# Patient Record
Sex: Male | Born: 1945
Health system: Southern US, Community
[De-identification: ages and names within clinical notes are randomized; demographics above are authoritative.]

## PROBLEM LIST (undated history)

## (undated) DIAGNOSIS — Z8659 Personal history of other mental and behavioral disorders: Secondary | ICD-10-CM

## (undated) DIAGNOSIS — E279 Disorder of adrenal gland, unspecified: Secondary | ICD-10-CM

## (undated) DIAGNOSIS — E785 Hyperlipidemia, unspecified: Secondary | ICD-10-CM

## (undated) DIAGNOSIS — N319 Neuromuscular dysfunction of bladder, unspecified: Secondary | ICD-10-CM

## (undated) DIAGNOSIS — Z8719 Personal history of other diseases of the digestive system: Secondary | ICD-10-CM

## (undated) DIAGNOSIS — E2609 Other primary hyperaldosteronism: Secondary | ICD-10-CM

## (undated) DIAGNOSIS — D3501 Benign neoplasm of right adrenal gland: Secondary | ICD-10-CM

## (undated) DIAGNOSIS — T8859XA Other complications of anesthesia, initial encounter: Secondary | ICD-10-CM

## (undated) DIAGNOSIS — I10 Essential (primary) hypertension: Secondary | ICD-10-CM

## (undated) DIAGNOSIS — I639 Cerebral infarction, unspecified: Secondary | ICD-10-CM

## (undated) DIAGNOSIS — I2510 Atherosclerotic heart disease of native coronary artery without angina pectoris: Secondary | ICD-10-CM

## (undated) DIAGNOSIS — T4145XA Adverse effect of unspecified anesthetic, initial encounter: Secondary | ICD-10-CM

## (undated) DIAGNOSIS — M1990 Unspecified osteoarthritis, unspecified site: Secondary | ICD-10-CM

## (undated) DIAGNOSIS — M5126 Other intervertebral disc displacement, lumbar region: Secondary | ICD-10-CM

## (undated) DIAGNOSIS — G709 Myoneural disorder, unspecified: Secondary | ICD-10-CM

## (undated) DIAGNOSIS — M2040 Other hammer toe(s) (acquired), unspecified foot: Secondary | ICD-10-CM

## (undated) DIAGNOSIS — I739 Peripheral vascular disease, unspecified: Secondary | ICD-10-CM

## (undated) DIAGNOSIS — M21619 Bunion of unspecified foot: Secondary | ICD-10-CM

## (undated) HISTORY — DX: Hyperlipidemia, unspecified: E78.5

## (undated) HISTORY — DX: Benign neoplasm of right adrenal gland: D35.01

## (undated) HISTORY — DX: Other primary hyperaldosteronism: E26.09

## (undated) HISTORY — DX: Neuromuscular dysfunction of bladder, unspecified: N31.9

## (undated) HISTORY — PX: CERVICAL FUSION: SHX112

## (undated) HISTORY — PX: HERNIA REPAIR: SHX51

## (undated) HISTORY — DX: Personal history of other mental and behavioral disorders: Z86.59

## (undated) HISTORY — DX: Peripheral vascular disease, unspecified: I73.9

## (undated) HISTORY — PX: CARPAL TUNNEL RELEASE: SHX101

## (undated) HISTORY — PX: CORONARY ANGIOPLASTY WITH STENT PLACEMENT: SHX49

---

## 2007-02-17 ENCOUNTER — Emergency Department (HOSPITAL_COMMUNITY): Admission: EM | Admit: 2007-02-17 | Discharge: 2007-02-17 | Payer: Self-pay | Admitting: Emergency Medicine

## 2007-08-10 ENCOUNTER — Encounter: Admission: RE | Admit: 2007-08-10 | Discharge: 2007-08-10 | Payer: Self-pay | Admitting: Orthopaedic Surgery

## 2008-11-03 ENCOUNTER — Ambulatory Visit (HOSPITAL_COMMUNITY): Admission: RE | Admit: 2008-11-03 | Discharge: 2008-11-03 | Payer: Self-pay | Admitting: Urology

## 2009-10-18 ENCOUNTER — Inpatient Hospital Stay (HOSPITAL_COMMUNITY): Admission: EM | Admit: 2009-10-18 | Discharge: 2009-10-23 | Payer: Self-pay | Admitting: Emergency Medicine

## 2009-12-04 ENCOUNTER — Emergency Department (HOSPITAL_COMMUNITY): Admission: EM | Admit: 2009-12-04 | Discharge: 2009-12-04 | Payer: Self-pay | Admitting: Emergency Medicine

## 2009-12-05 ENCOUNTER — Emergency Department (HOSPITAL_COMMUNITY): Admission: EM | Admit: 2009-12-05 | Discharge: 2009-12-05 | Payer: Self-pay | Admitting: Emergency Medicine

## 2010-09-20 ENCOUNTER — Other Ambulatory Visit: Payer: Self-pay | Admitting: Specialist

## 2010-09-20 ENCOUNTER — Ambulatory Visit
Admission: RE | Admit: 2010-09-20 | Discharge: 2010-09-20 | Disposition: A | Discharge status: Home / Self Care | Payer: Self-pay | Source: Ambulatory Visit | Attending: Specialist | Admitting: Specialist

## 2010-09-20 DIAGNOSIS — R52 Pain, unspecified: Secondary | ICD-10-CM

## 2010-09-30 LAB — BASIC METABOLIC PANEL
BUN: 10 mg/dL (ref 6–23)
CO2: 28 mEq/L (ref 19–32)
Calcium: 8.8 mg/dL (ref 8.4–10.5)
Chloride: 103 mEq/L (ref 96–112)
GFR calc non Af Amer: >60 mL/min (ref >60)
Potassium: 3.5 mEq/L (ref 3.5–5.1)

## 2010-09-30 LAB — DIFFERENTIAL
Basophils Relative: 0 % (ref 0–1)
Eosinophils Relative: 5 % (ref 0–5)
Lymphocytes Relative: 29 % (ref 12–46)
Monocytes Absolute: 0.5 10*3/uL (ref 0.1–1.0)

## 2010-09-30 LAB — CBC
HCT: 41.1 % (ref 39.0–52.0)
Hemoglobin: 14 g/dL (ref 13.0–17.0)
MCHC: 34.1 g/dL (ref 30.0–36.0)
Platelets: 177 10*3/uL (ref 150–400)
RDW: 13.4 % (ref 11.5–15.5)

## 2010-10-02 LAB — CBC
HCT: 37.1 % — ABNORMAL LOW (ref 39.0–52.0)
HCT: 37.7 % — ABNORMAL LOW (ref 39.0–52.0)
HCT: 37.7 % — ABNORMAL LOW (ref 39.0–52.0)
HCT: 38.7 % — ABNORMAL LOW (ref 39.0–52.0)
Hemoglobin: 12.7 g/dL — ABNORMAL LOW (ref 13.0–17.0)
Hemoglobin: 13 g/dL (ref 13.0–17.0)
Hemoglobin: 13.1 g/dL (ref 13.0–17.0)
Hemoglobin: 14 g/dL (ref 13.0–17.0)
MCHC: 33.7 g/dL (ref 30.0–36.0)
MCHC: 33.9 g/dL (ref 30.0–36.0)
MCV: 98 fL (ref 78.0–100.0)
MCV: 98.7 fL (ref 78.0–100.0)
MCV: 98.8 fL (ref 78.0–100.0)
Platelets: 139 10*3/uL — ABNORMAL LOW (ref 150–400)
Platelets: 140 10*3/uL — ABNORMAL LOW (ref 150–400)
RBC: 3.79 MIL/uL — ABNORMAL LOW (ref 4.22–5.81)
RBC: 3.81 MIL/uL — ABNORMAL LOW (ref 4.22–5.81)
RBC: 4.16 MIL/uL — ABNORMAL LOW (ref 4.22–5.81)
RDW: 13.4 % (ref 11.5–15.5)
RDW: 13.7 % (ref 11.5–15.5)
WBC: 5.1 10*3/uL (ref 4.0–10.5)
WBC: 5.4 10*3/uL (ref 4.0–10.5)
WBC: 6.1 10*3/uL (ref 4.0–10.5)
WBC: 7 10*3/uL (ref 4.0–10.5)

## 2010-10-02 LAB — URINALYSIS, ROUTINE W REFLEX MICROSCOPIC
Bilirubin Urine: NEGATIVE
Hgb urine dipstick: NEGATIVE
Specific Gravity, Urine: 1.009 (ref 1.005–1.030)
pH: 7 (ref 5.0–8.0)

## 2010-10-02 LAB — DIFFERENTIAL
Basophils Absolute: 0 10*3/uL (ref 0.0–0.1)
Basophils Relative: 1 % (ref 0–1)
Eosinophils Absolute: 0.1 10*3/uL (ref 0.0–0.7)
Eosinophils Relative: 1 % (ref 0–5)
Eosinophils Relative: 3 % (ref 0–5)
Lymphocytes Relative: 21 % (ref 12–46)
Lymphocytes Relative: 32 % (ref 12–46)
Lymphs Abs: 1.3 10*3/uL (ref 0.7–4.0)
Lymphs Abs: 1.8 10*3/uL (ref 0.7–4.0)
Monocytes Absolute: 0.6 10*3/uL (ref 0.1–1.0)
Monocytes Absolute: 0.9 10*3/uL (ref 0.1–1.0)
Monocytes Relative: 11 % (ref 3–12)
Monocytes Relative: 15 % — ABNORMAL HIGH (ref 3–12)

## 2010-10-02 LAB — TROPONIN I
Troponin I: 0.01 ng/mL (ref 0.00–0.06)
Troponin I: <0.01 ng/mL (ref 0.00–0.06)

## 2010-10-02 LAB — COMPREHENSIVE METABOLIC PANEL
ALT: 42 U/L (ref 0–53)
AST: 30 U/L (ref 0–37)
Albumin: 4.3 g/dL (ref 3.5–5.2)
Alkaline Phosphatase: 54 U/L (ref 39–117)
Calcium: 9.3 mg/dL (ref 8.4–10.5)
Creatinine, Ser: 0.91 mg/dL (ref 0.4–1.5)
Glucose, Bld: 115 mg/dL — ABNORMAL HIGH (ref 70–99)
Total Bilirubin: 0.9 mg/dL (ref 0.3–1.2)
Total Protein: 6.9 g/dL (ref 6.0–8.3)

## 2010-10-02 LAB — BASIC METABOLIC PANEL
BUN: 9 mg/dL (ref 6–23)
CO2: 24 mEq/L (ref 19–32)
Calcium: 8.5 mg/dL (ref 8.4–10.5)
Calcium: 8.7 mg/dL (ref 8.4–10.5)
Chloride: 104 mEq/L (ref 96–112)
Chloride: 105 mEq/L (ref 96–112)
GFR calc Af Amer: >60 mL/min (ref >60)
GFR calc Af Amer: >60 mL/min (ref >60)
GFR calc non Af Amer: >60 mL/min (ref >60)
GFR calc non Af Amer: >60 mL/min (ref >60)
Glucose, Bld: 127 mg/dL — ABNORMAL HIGH (ref 70–99)
Glucose, Bld: 137 mg/dL — ABNORMAL HIGH (ref 70–99)
Glucose, Bld: 140 mg/dL — ABNORMAL HIGH (ref 70–99)
Potassium: 3.3 mEq/L — ABNORMAL LOW (ref 3.5–5.1)
Potassium: 3.3 mEq/L — ABNORMAL LOW (ref 3.5–5.1)
Potassium: 4.1 mEq/L (ref 3.5–5.1)
Sodium: 137 mEq/L (ref 135–145)
Sodium: 137 mEq/L (ref 135–145)
Sodium: 138 mEq/L (ref 135–145)

## 2010-10-02 LAB — HEMOGLOBIN A1C: Mean Plasma Glucose: 137 mg/dL

## 2010-10-02 LAB — CK TOTAL AND CKMB (NOT AT ARMC)
CK, MB: 9.2 ng/mL (ref 0.3–4.0)
Total CK: 498 U/L — ABNORMAL HIGH (ref 7–232)
Total CK: 543 U/L — ABNORMAL HIGH (ref 7–232)

## 2010-10-02 LAB — PROTIME-INR
INR: 1.01 (ref 0.00–1.49)
INR: 1.04 (ref 0.00–1.49)
Prothrombin Time: 13.5 seconds (ref 11.6–15.2)

## 2010-10-02 LAB — CARDIAC PANEL(CRET KIN+CKTOT+MB+TROPI)
CK, MB: 5.5 ng/mL — ABNORMAL HIGH (ref 0.3–4.0)
Relative Index: 1.6 (ref 0.0–2.5)

## 2010-10-02 LAB — URINE CULTURE: Colony Count: 6000

## 2010-10-02 LAB — LIPID PANEL
HDL: 46 mg/dL (ref >39)
VLDL: 11 mg/dL (ref 0–40)

## 2010-10-02 LAB — APTT: aPTT: 36 seconds (ref 24–37)

## 2011-04-28 LAB — COMPREHENSIVE METABOLIC PANEL
ALT: 91 — ABNORMAL HIGH
BUN: 14
CO2: 27
Calcium: 8.7
GFR calc non Af Amer: 54 — ABNORMAL LOW
Glucose, Bld: 103 — ABNORMAL HIGH
Sodium: 135
Total Protein: 6

## 2011-04-28 LAB — POCT CARDIAC MARKERS
CKMB, poc: 4.3
Myoglobin, poc: 162
Myoglobin, poc: 183
Operator id: 285491
Operator id: 285491
Operator id: 285491
Troponin i, poc: <0.05
Troponin i, poc: <0.05

## 2011-04-28 LAB — CBC
HCT: 41.5
Hemoglobin: 14.5
MCHC: 34.9
MCV: 96
RBC: 4.32
RDW: 13.2

## 2011-04-28 LAB — DIFFERENTIAL
Basophils Relative: 1
Eosinophils Absolute: 0.1
Lymphs Abs: 1.4
Monocytes Relative: 10
Neutro Abs: 3
Neutrophils Relative %: 61

## 2011-04-28 LAB — APTT: aPTT: 34

## 2011-05-25 ENCOUNTER — Emergency Department (HOSPITAL_COMMUNITY)
Admission: EM | Admit: 2011-05-25 | Discharge: 2011-05-25 | Disposition: A | Discharge status: Home / Self Care | Payer: Medicare Other | Attending: Emergency Medicine | Admitting: Emergency Medicine

## 2011-05-25 ENCOUNTER — Encounter: Payer: Self-pay | Admitting: *Deleted

## 2011-05-25 DIAGNOSIS — Z981 Arthrodesis status: Secondary | ICD-10-CM | POA: Insufficient documentation

## 2011-05-25 DIAGNOSIS — M79609 Pain in unspecified limb: Secondary | ICD-10-CM | POA: Insufficient documentation

## 2011-05-25 DIAGNOSIS — M7989 Other specified soft tissue disorders: Secondary | ICD-10-CM

## 2011-05-25 DIAGNOSIS — Z8673 Personal history of transient ischemic attack (TIA), and cerebral infarction without residual deficits: Secondary | ICD-10-CM | POA: Insufficient documentation

## 2011-05-25 DIAGNOSIS — Z79899 Other long term (current) drug therapy: Secondary | ICD-10-CM | POA: Insufficient documentation

## 2011-05-25 DIAGNOSIS — M25579 Pain in unspecified ankle and joints of unspecified foot: Secondary | ICD-10-CM | POA: Insufficient documentation

## 2011-05-25 DIAGNOSIS — M62838 Other muscle spasm: Secondary | ICD-10-CM | POA: Insufficient documentation

## 2011-05-25 DIAGNOSIS — I891 Lymphangitis: Secondary | ICD-10-CM | POA: Insufficient documentation

## 2011-05-25 DIAGNOSIS — IMO0001 Reserved for inherently not codable concepts without codable children: Secondary | ICD-10-CM | POA: Insufficient documentation

## 2011-05-25 DIAGNOSIS — R509 Fever, unspecified: Secondary | ICD-10-CM | POA: Insufficient documentation

## 2011-05-25 DIAGNOSIS — M25559 Pain in unspecified hip: Secondary | ICD-10-CM | POA: Insufficient documentation

## 2011-05-25 DIAGNOSIS — I10 Essential (primary) hypertension: Secondary | ICD-10-CM | POA: Insufficient documentation

## 2011-05-25 HISTORY — DX: Cerebral infarction, unspecified: I63.9

## 2011-05-25 HISTORY — DX: Atherosclerotic heart disease of native coronary artery without angina pectoris: I25.10

## 2011-05-25 HISTORY — DX: Essential (primary) hypertension: I10

## 2011-05-25 LAB — PROTIME-INR: Prothrombin Time: 13.4 seconds (ref 11.6–15.2)

## 2011-05-25 LAB — BASIC METABOLIC PANEL
CO2: 25 mEq/L (ref 19–32)
Chloride: 97 mEq/L (ref 96–112)
Potassium: 4.6 mEq/L (ref 3.5–5.1)
Sodium: 133 mEq/L — ABNORMAL LOW (ref 135–145)

## 2011-05-25 LAB — CBC
HCT: 44.3 % (ref 39.0–52.0)
Platelets: 187 10*3/uL (ref 150–400)
RDW: 13.4 % (ref 11.5–15.5)
WBC: 10.6 10*3/uL — ABNORMAL HIGH (ref 4.0–10.5)

## 2011-05-25 LAB — DIFFERENTIAL
Basophils Absolute: 0 10*3/uL (ref 0.0–0.1)
Lymphocytes Relative: 19 % (ref 12–46)
Neutro Abs: 6.8 10*3/uL (ref 1.7–7.7)
Neutrophils Relative %: 64 % (ref 43–77)

## 2011-05-25 LAB — APTT: aPTT: 34 seconds (ref 24–37)

## 2011-05-25 MED ORDER — CLINDAMYCIN HCL 150 MG PO CAPS: 300.0000 mg | ORAL_CAPSULE | Freq: Four times a day (QID) | ORAL | Status: AC

## 2011-05-25 MED ORDER — HYDROCODONE-ACETAMINOPHEN 5-325 MG PO TABS: 1.0000 | ORAL_TABLET | Freq: Four times a day (QID) | ORAL | Status: AC | PRN

## 2011-05-25 MED ORDER — CLINDAMYCIN PHOSPHATE 600 MG/4ML IJ SOLN
INTRAMUSCULAR | Status: AC
  Filled 2011-05-25: qty 4

## 2011-05-25 MED ORDER — SODIUM CHLORIDE 0.9 % IV SOLN
INTRAVENOUS | Status: DC
  Administered 2011-05-25: 11:00:00 via INTRAVENOUS

## 2011-05-25 MED ORDER — ACETAMINOPHEN 325 MG PO TABS: 650.0000 mg | ORAL_TABLET | ORAL | Status: DC | PRN

## 2011-05-25 MED ORDER — MORPHINE SULFATE 4 MG/ML IJ SOLN: 4.0000 mg | INTRAMUSCULAR | Status: DC | PRN

## 2011-05-25 MED ORDER — DEXTROSE 5 % IV SOLN
INTRAVENOUS | Status: AC
  Filled 2011-05-25: qty 50

## 2011-05-25 MED ORDER — CLINDAMYCIN PHOSPHATE 600 MG/50ML IV SOLN
600.0000 mg | INTRAVENOUS | Status: AC
Start: 2011-05-25 — End: 2011-05-25
  Administered 2011-05-25: 600 mg via INTRAVENOUS
  Filled 2011-05-25: qty 50

## 2011-05-25 NOTE — ED Provider Notes (Signed)
History     CSN: [id number] Arrival date & time: 05/25/2011  2:00 AM   First MD Initiated Contact with Patient 05/25/11 0236      Chief Complaint  Patient presents with  . Leg Pain    (Consider location/radiation/quality/duration/timing/severity/associated sxs/prior treatment) HPI Comments: Well-appearing 65 year old male with no history of diabetes who presents with redness and pain to his left lower extremity. The pain started on Wednesday and has progressively gotten worse and today noticed that he had some red streaking from his lower medial extremity on the medial surface up to his groin. This is in a linear fashion, has associated warmth. He has had a stroke in the past affecting his left side causing weakness and some cramping and fasciculations. This has not changed. He denies trauma, swelling, shortness of breath or chest pain. He is on Plavix for his prior stroke and heart disease but has never been on Coumadin. He denies any bleeding problems. He has not been on any long trips recently however due to his stroke he does have a relative immobility of his left lower extremity. He does admit to a subjective fever in the last 24 hours.  Patient is a 65 y.o. male presenting with leg pain. The history is provided by the patient.  Leg Pain  Incident onset: 5 days ago. The incident occurred at home. There was no injury mechanism. Pain location: Left lower extremity from ankle to hip. The quality of the pain is described as aching. The pain is mild. The pain has been constant since onset. Associated symptoms comments: Redness and warmth, fever. He reports no foreign bodies present. The symptoms are aggravated by nothing. Treatments tried: Prescription for cephalexin for a prior illness times one dose. The treatment provided no relief.    Past Medical History  Diagnosis Date  . Stroke   . Hypertension     Past Surgical History  Procedure Date  . Cervical fusion     History reviewed.  No pertinent family history.  History  Substance Use Topics  . Smoking status: Never Smoker   . Smokeless tobacco: Not on file  . Alcohol Use: No      Review of Systems  All other systems reviewed and are negative.    Allergies  Elavil  Home Medications   Current Outpatient Rx  Name Route Sig Dispense Refill  . CEPHALEXIN 500 MG PO CAPS Oral Take 500 mg by mouth 2 (two) times daily.      Marland Kitchen CLONIDINE HCL 0.2 MG PO TABS Oral Take 0.2 mg by mouth 2 (two) times daily.      Marland Kitchen CLOPIDOGREL BISULFATE 75 MG PO TABS Oral Take 75 mg by mouth daily.      Marland Kitchen HYDROCODONE-ACETAMINOPHEN 10-500 MG PO TABS Oral Take 1 tablet by mouth every 6 (six) hours as needed.      Marland Kitchen LABETALOL HCL 300 MG PO TABS Oral Take 300 mg by mouth 2 (two) times daily.      Marland Kitchen NIFEDIPINE ER 60 MG PO TB24 Oral Take 60 mg by mouth daily.      Marland Kitchen SIMVASTATIN 20 MG PO TABS Oral Take 20 mg by mouth at bedtime.      . SPIRONOLACTONE 50 MG PO TABS Oral Take 50 mg by mouth 2 (two) times daily.      Marland Kitchen TAMSULOSIN HCL 0.4 MG PO CAPS Oral Take 0.4 mg by mouth daily.      Marland Kitchen TIZANIDINE HCL 4 MG PO TABS Oral Take  4 mg by mouth every 6 (six) hours as needed.        BP 157/72  Temp(Src) 98.3 F (36.8 C) (Oral)  Resp 18  SpO2 99%  Physical Exam  Nursing note and vitals reviewed. Constitutional: He appears well-developed and well-nourished. No distress.  HENT:  Head: Normocephalic and atraumatic.  Mouth/Throat: Oropharynx is clear and moist. No oropharyngeal exudate.  Eyes: Conjunctivae and EOM are normal. Pupils are equal, round, and reactive to light. Right eye exhibits no discharge. Left eye exhibits no discharge. No scleral icterus.  Neck: Normal range of motion. Neck supple. No JVD present. No thyromegaly present.  Cardiovascular: Normal rate, regular rhythm, normal heart sounds and intact distal pulses.  Exam reveals no gallop and no friction rub.   No murmur heard. Pulmonary/Chest: Effort normal and breath sounds normal.  No respiratory distress. He has no wheezes. He has no rales.  Abdominal: Soft. Bowel sounds are normal. He exhibits no distension and no mass. There is no tenderness.  Musculoskeletal: Normal range of motion. He exhibits tenderness. He exhibits no edema ( No edema of the bilateral lower extremities.).       Flexion contracture of the left upper extremity, left lower extremity with some muscle fasciculations and redness and streaking from his left lower medial distal extremity on the medial surface following a saphenous vein distribution to the left inguinal area. There are no masses or lymphadenopathy following this past  Lymphadenopathy:    He has no cervical adenopathy.  Neurological: He is alert. Coordination normal.  Skin: Skin is warm and dry. There is erythema ( As described in musculoskeletal).  Psychiatric: He has a normal mood and affect. His behavior is normal.    ED Course  Procedures (including critical care time)  Labs Reviewed - No data to display No results found.   No diagnosis found.    MDM  No chest pain or shortness of breath, has new streaking of his left lower extremity consistent with either a standing lymphangitis, would also consider superficial or deep thrombophlebitis of the left lower extremity. Will obtain coags, lab work, d-dimer, plan on CDU admission for ultrasound in the morning. Until that time we'll treat with vancomycin for presumed infection. There are no other wounds on his foot to suggest a source for introduction of infection. His vital signs at this time reveal no fevers tachycardia or hypotension.   Labwork ordered, ultrasound ordered, placed on CDU observation protocol for DVT. Has been given antibiotics for possible descending lymphangitis. I discussed at length with the patient his need for possible admission should he have a negative ultrasound for possible significant infection of his leg but he has declined stating that he has to take care of  his elderly mother and we'll need to go home today. He is amenable to staying for the ultrasound this morning.  Change of shift, care signed out to Dr. Jeraldine Loots and [facility]    Vida Roller, MD 05/25/11 [phone]

## 2011-05-25 NOTE — ED Provider Notes (Signed)
Patient is in CDU under observation, DVT protocol.  Patient has had negative doppler study.  IV clindamycin has been ordered.  Results discussed with patient.  Patient with erythematous streak up inner aspect of left leg.  Plan is for IV abx and d/c home with PO abx.  Discussed with Dr Hyacinth Meeker that I would attempt to call PCP to ensure follow up.  Will put in page - as it is Sunday I am unsure if the page will be returned.  11:47 AM Bosie Clos, Diplomatic Services operational officer has attempted to call the practice and states there is no evidence of anyone being on call.   12:13 PM Patient is feeling very well, ready to go home.  First Clindamycin dose given.  Discussed with Dr Jeraldine Loots as I was unable to contact PCP.  Patient to follow up in 24-48 hours here or with PCP.  Pt verbalizes understanding of this plan.   Howard Jackson, [place] 05/25/11 1620

## 2011-05-25 NOTE — ED Notes (Signed)
Pt has a red streak going up inside leg from above ankle to groin. Flaky skin on left lower extremity between ankle and knee.

## 2011-05-25 NOTE — ED Notes (Signed)
Report received from Myrtha Mantis, RN. Pt is currently having vascular study done of leg. Pt will be brought to cdu#3 after study is completed.

## 2011-05-25 NOTE — Progress Notes (Signed)
*  PRELIMINARY RESULTS*   Left lower extremity venous Doppler has been performed.  There is no deep vein thrombosis or superficial thrombosis noted in the left lower extremity.  Sherren Kerns Renee 05/25/2011, 8:57 AM

## 2011-05-25 NOTE — ED Notes (Signed)
Pt reports that he thinks he left a bottle of his medication on the ambulance that brought him in last night. Talked with Kristi, CN and she talked with [facility] and they are checking into it.

## 2011-05-25 NOTE — ED Notes (Signed)
Pt arrived via [facility] c/o Pain in left leg with redness, that started 3 days ago. EMS VS BP 180/80, HR 100.

## 2011-05-26 NOTE — ED Provider Notes (Signed)
Medical screening examination/treatment/procedure(s) were conducted as a shared visit with non-physician practitioner(s) and myself.  I personally evaluated the patient during the encounter ' Please see my personal evaluation as separate and complete note.  Vida Roller, MD 05/26/11 [phone]

## 2011-10-31 ENCOUNTER — Other Ambulatory Visit: Payer: Self-pay | Admitting: Neurology

## 2011-10-31 DIAGNOSIS — M5430 Sciatica, unspecified side: Secondary | ICD-10-CM

## 2011-10-31 DIAGNOSIS — I679 Cerebrovascular disease, unspecified: Secondary | ICD-10-CM

## 2011-10-31 DIAGNOSIS — R269 Unspecified abnormalities of gait and mobility: Secondary | ICD-10-CM

## 2011-11-06 ENCOUNTER — Ambulatory Visit
Admission: RE | Admit: 2011-11-06 | Discharge: 2011-11-06 | Disposition: A | Discharge status: Home / Self Care | Payer: Medicare Other | Source: Ambulatory Visit | Attending: Neurology | Admitting: Neurology

## 2011-11-06 DIAGNOSIS — M5430 Sciatica, unspecified side: Secondary | ICD-10-CM

## 2011-11-06 DIAGNOSIS — R269 Unspecified abnormalities of gait and mobility: Secondary | ICD-10-CM

## 2011-11-06 DIAGNOSIS — I679 Cerebrovascular disease, unspecified: Secondary | ICD-10-CM

## 2012-09-22 ENCOUNTER — Ambulatory Visit (INDEPENDENT_AMBULATORY_CARE_PROVIDER_SITE_OTHER): Payer: Medicare Other | Admitting: Surgery

## 2012-09-22 ENCOUNTER — Encounter (INDEPENDENT_AMBULATORY_CARE_PROVIDER_SITE_OTHER): Payer: Self-pay | Admitting: General Surgery

## 2012-09-22 ENCOUNTER — Encounter (INDEPENDENT_AMBULATORY_CARE_PROVIDER_SITE_OTHER): Payer: Self-pay | Admitting: Surgery

## 2012-09-22 VITALS — BP 126/80 | HR 74 | Resp 16 | Ht 66.0 in | Wt 169.8 lb

## 2012-09-22 DIAGNOSIS — K4090 Unilateral inguinal hernia, without obstruction or gangrene, not specified as recurrent: Secondary | ICD-10-CM | POA: Insufficient documentation

## 2012-09-22 NOTE — Progress Notes (Signed)
Patient ID: Howard Jackson, male   DOB: 1946/02/18, 66 y.o.   MRN: [id number]  Chief Complaint  Patient presents with  . Other    possible inguinal hernia    HPI Howard Jackson is a 67 y.o. male.   HPI This is a pleasant gentleman referred by Dr. Lerry Liner for evaluation of a symptomatic left inguinal hernia. He has had the hernia for many years but is now causing sharp pain and discomfort for the last 2-3 months. He denies nausea or vomiting or obstructive symptoms. He has a significant past medical history and reports he even has a pheochromocytoma. Past Medical History  Diagnosis Date  . Stroke   . Hypertension   . Coronary artery disease     Past Surgical History  Procedure Laterality Date  . Cervical fusion    . Coronary angioplasty with stent placement      pt reports having 5 stents in his heart    History reviewed. No pertinent family history.  Social History History  Substance Use Topics  . Smoking status: Never Smoker   . Smokeless tobacco: Not on file  . Alcohol Use: No    Allergies  Allergen Reactions  . Elavil (Amitriptyline Hcl) Hypertension    Current Outpatient Prescriptions  Medication Sig Dispense Refill  . Acetylcysteine 600 MG CAPS Take by mouth.      . ASTRAGALUS PO Take by mouth.      . cloNIDine (CATAPRES) 0.2 MG tablet Take 0.1-0.2 mg by mouth 2 (two) times daily. Takes 1 tablet in the morning, and 0.5 tablet in the evening      . clopidogrel (PLAVIX) 75 MG tablet Take 75 mg by mouth daily.        . Coenzyme Q10 (CO Q 10 PO) Take by mouth.      . cyclobenzaprine (FLEXERIL) 5 MG tablet Take 5 mg by mouth 3 (three) times daily as needed for muscle spasms.      . DEVILS CLAW PO Take by mouth.      Marland Kitchen HYDROcodone-acetaminophen (LORTAB) 10-500 MG per tablet Take 1 tablet by mouth every 6 (six) hours as needed.        . indomethacin (INDOCIN) 25 MG capsule Take 25 mg by mouth 2 (two) times daily with a meal.      . labetalol (NORMODYNE) 300 MG  tablet Take 300 mg by mouth 2 (two) times daily.        . Misc Natural Products (COLON CARE PO) Take by mouth.      Marland Kitchen NIFEdipine (PROCARDIA-XL/ADALAT CC) 60 MG 24 hr tablet Take 60 mg by mouth every evening.       . sertraline (ZOLOFT) 50 MG tablet Take 50 mg by mouth daily.      . simvastatin (ZOCOR) 20 MG tablet Take 20 mg by mouth daily.       Marland Kitchen spironolactone (ALDACTONE) 50 MG tablet Take 50 mg by mouth 2 (two) times daily.        . Tamsulosin HCl (FLOMAX) 0.4 MG CAPS Take 0.4 mg by mouth daily.        Marland Kitchen tiZANidine (ZANAFLEX) 4 MG tablet Take 4 mg by mouth every 6 (six) hours as needed. Shoulder pain       No current facility-administered medications for this visit.    Review of Systems Review of Systems  Constitutional: Negative for fever, chills and unexpected weight change.  HENT: Negative for hearing loss, congestion, sore throat, trouble swallowing and voice change.  Eyes: Negative for visual disturbance.  Respiratory: Negative for cough and wheezing.   Cardiovascular: Negative for chest pain, palpitations and leg swelling.  Gastrointestinal: Positive for abdominal pain. Negative for nausea, vomiting, diarrhea, constipation, blood in stool, abdominal distention, anal bleeding and rectal pain.  Endocrine: Positive for polyuria.  Genitourinary: Negative for hematuria and difficulty urinating.  Musculoskeletal: Negative for arthralgias.  Skin: Negative for rash and wound.  Neurological: Positive for weakness. Negative for seizures, syncope and headaches.  Hematological: Negative for adenopathy. Does not bruise/bleed easily.  Psychiatric/Behavioral: Negative for confusion.    Blood pressure 126/80, pulse 74, resp. rate 16, height 5\' 6"  (1.676 m), weight 169 lb 12.8 oz (77.021 kg).  Physical Exam Physical Exam  Constitutional: He is oriented to person, place, and time. He appears well-developed and well-nourished. No distress.  HENT:  Head: Normocephalic and atraumatic.   Right Ear: External ear normal.  Nose: Nose normal.  Mouth/Throat: Oropharynx is clear and moist.  Eyes: Conjunctivae are normal. Pupils are equal, round, and reactive to light. Right eye exhibits no discharge. Left eye exhibits no discharge. No scleral icterus.  Neck: Normal range of motion. Neck supple. No JVD present. No tracheal deviation present. No thyromegaly present.  Cardiovascular: Normal rate, regular rhythm, normal heart sounds and intact distal pulses.   No murmur heard. Pulmonary/Chest: Effort normal and breath sounds normal. No respiratory distress. He has no wheezes. He has no rales.  Abdominal: Soft. Bowel sounds are normal. He exhibits no distension. There is no tenderness. There is no rebound.  Moderate-sized left inguinal hernia which is reducible. There is no right inguinal hernia  Musculoskeletal: He exhibits no edema and no tenderness.  Abnormality of his left upper and lower extremity from his stroke  Lymphadenopathy:    He has no cervical adenopathy.  Neurological: He is alert and oriented to person, place, and time.  Decreased motor function of the left upper and lower extremity  Skin: Skin is warm and dry. No rash noted. He is not diaphoretic. No erythema.  Psychiatric: His behavior is normal. Judgment normal.    Data Reviewed   Assessment    Left inguinal hernia     Plan    Repair with mesh is recommended secondary to his discomfort. He will need cardiac evaluation for clearance preoperatively. I would also limit his anesthesia secondary to his history of pheochromocytoma. We will schedule his surgery after getting cardiac clearance.        BLACKMAN,DOUGLAS A 09/22/2012, 3:38 PM

## 2012-09-30 ENCOUNTER — Other Ambulatory Visit (HOSPITAL_COMMUNITY): Payer: Self-pay | Admitting: Cardiovascular Disease

## 2012-09-30 DIAGNOSIS — I10 Essential (primary) hypertension: Secondary | ICD-10-CM

## 2012-09-30 DIAGNOSIS — Z01810 Encounter for preprocedural cardiovascular examination: Secondary | ICD-10-CM

## 2012-09-30 DIAGNOSIS — I2510 Atherosclerotic heart disease of native coronary artery without angina pectoris: Secondary | ICD-10-CM

## 2012-10-14 ENCOUNTER — Ambulatory Visit (HOSPITAL_COMMUNITY)
Admission: RE | Admit: 2012-10-14 | Discharge: 2012-10-14 | Disposition: A | Discharge status: Home / Self Care | Payer: Medicare Other | Source: Ambulatory Visit | Attending: Cardiovascular Disease | Admitting: Cardiovascular Disease

## 2012-10-14 DIAGNOSIS — R5383 Other fatigue: Secondary | ICD-10-CM | POA: Insufficient documentation

## 2012-10-14 DIAGNOSIS — Z8249 Family history of ischemic heart disease and other diseases of the circulatory system: Secondary | ICD-10-CM | POA: Insufficient documentation

## 2012-10-14 DIAGNOSIS — I739 Peripheral vascular disease, unspecified: Secondary | ICD-10-CM | POA: Insufficient documentation

## 2012-10-14 DIAGNOSIS — I10 Essential (primary) hypertension: Secondary | ICD-10-CM

## 2012-10-14 DIAGNOSIS — I2510 Atherosclerotic heart disease of native coronary artery without angina pectoris: Secondary | ICD-10-CM

## 2012-10-14 DIAGNOSIS — R5381 Other malaise: Secondary | ICD-10-CM | POA: Insufficient documentation

## 2012-10-14 DIAGNOSIS — R42 Dizziness and giddiness: Secondary | ICD-10-CM | POA: Insufficient documentation

## 2012-10-14 DIAGNOSIS — Z01810 Encounter for preprocedural cardiovascular examination: Secondary | ICD-10-CM | POA: Insufficient documentation

## 2012-10-14 MED ORDER — AMINOPHYLLINE 25 MG/ML IV SOLN
75.0000 mg | Freq: Once | INTRAVENOUS | Status: AC
  Administered 2012-10-14: 75 mg via INTRAVENOUS

## 2012-10-14 MED ORDER — TECHNETIUM TC 99M SESTAMIBI GENERIC - CARDIOLITE
30.0000 | Freq: Once | INTRAVENOUS | Status: AC | PRN
  Administered 2012-10-14: 30 via INTRAVENOUS

## 2012-10-14 MED ORDER — REGADENOSON 0.4 MG/5ML IV SOLN
0.4000 mg | Freq: Once | INTRAVENOUS | Status: AC
  Administered 2012-10-14: 0.4 mg via INTRAVENOUS

## 2012-10-14 MED ORDER — TECHNETIUM TC 99M SESTAMIBI GENERIC - CARDIOLITE
10.0000 | Freq: Once | INTRAVENOUS | Status: AC | PRN
  Administered 2012-10-14: 10 via INTRAVENOUS

## 2012-10-14 NOTE — Procedures (Addendum)
[facility] [address] [place] [address] [phone]  Cardiology Nuclear Med Study  Howard Jackson is a 67 y.o. male     MRN : [id number]     DOB: 01-Sep-1945  Procedure Date: 10/14/2012  Nuclear Med Background Indication for Stress Test:  Surgical Clearance History:  ASTHMA;CAD;MI;STENT/PTCA-2011 Cardiac Risk Factors: CVA, Family History - CAD, Hypertension, Lipids, Overweight and PVD  Symptoms:  Dizziness, Fatigue and Light-Headedness   Nuclear Pre-Procedure Caffeine/Decaff Intake:  9:00pm NPO After: 7:00am   IV Site: R Antecubital  IV 0.9% NS with Angio Cath:  22g  Chest Size (in):  44" IV Started by: Emmit Pomfret, RN  Height: 5\' 7"  (1.702 m)  Cup Size: n/a  BMI:  Body mass index is 26.31 kg/(m^2). Weight:  168 lb (76.204 kg)   Tech Comments:  N/A    Nuclear Med Study 1 or 2 day study: 1 day  Stress Test Type:  Lexiscan  Order Authorizing Provider:  Nanetta Batty, MD   Resting Radionuclide: Technetium 93m Sestamibi  Resting Radionuclide Dose: 10.2 mCi   Stress Radionuclide:  Technetium 54m Sestamibi  Stress Radionuclide Dose: 30.9 mCi           Stress Protocol Rest HR: 57 Stress HR: 75  Rest BP: 122/70 Stress BP: 110/67  Exercise Time (min): n/a METS: n/a   Predicted Max HR: 152 bpm % Max HR: 49.34 bpm Rate Pressure Product: 9150  Dose of Adenosine (mg):  n/a Dose of Lexiscan: 0.4 mg  Dose of Atropine (mg): n/a Dose of Dobutamine: n/a mcg/kg/min (at max HR)  Stress Test Technologist: Esperanza Sheets, CCT Nuclear Technologist: Koren Shiver, CNMT   Rest Procedure:  Myocardial perfusion imaging was performed at rest 45 minutes following the intravenous administration of Technetium 28m Sestamibi. Stress Procedure:  The patient received IV Lexiscan 0.4 mg over 15-seconds.  Technetium 36m Sestamibi injected at 30-seconds.  The Patient experienced SOB and was administered 75 mg of IV Aminophylline with quick  resolution.  There were no significant changes with Lexiscan.  Quantitative spect images were obtained after a 45 minute delay.  Transient Ischemic Dilatation (Normal <1.22):  0.87  Lung/Heart Ratio (Normal <0.45):  0.41 QGS EDV:  52 ml QGS ESV:  12 ml LV Ejection Fraction: 77%  Signed by       Rest ECG: NSR - Normal EKG  Stress ECG: No significant change from baseline ECG  QPS Raw Data Images:  Normal; no motion artifact; normal heart/lung ratio. Stress Images:  Normal homogeneous uptake in all areas of the myocardium. Rest Images:  Normal homogeneous uptake in all areas of the myocardium. Subtraction (SDS):  No evidence of ischemia.  Impression Exercise Capacity:  Lexiscan with no exercise. BP Response:  Normal blood pressure response. Clinical Symptoms:  No significant symptoms noted. ECG Impression:  No significant ST segment change suggestive of ischemia. Comparison with Prior Nuclear Study: No significant change from previous study  Overall Impression:  Normal stress nuclear study.  LV Wall Motion:  NL LV Function; NL Wall Motion   Runell Gess, MD  10/14/2012 1:07 PM

## 2012-10-25 ENCOUNTER — Ambulatory Visit (HOSPITAL_COMMUNITY)
Admission: RE | Admit: 2012-10-25 | Discharge: 2012-10-25 | Disposition: A | Discharge status: Home / Self Care | Payer: Medicare Other | Source: Ambulatory Visit | Attending: Cardiovascular Disease | Admitting: Cardiovascular Disease

## 2012-10-25 DIAGNOSIS — Z01810 Encounter for preprocedural cardiovascular examination: Secondary | ICD-10-CM

## 2012-10-25 DIAGNOSIS — I10 Essential (primary) hypertension: Secondary | ICD-10-CM | POA: Insufficient documentation

## 2012-10-25 DIAGNOSIS — I2510 Atherosclerotic heart disease of native coronary artery without angina pectoris: Secondary | ICD-10-CM

## 2012-10-25 NOTE — Progress Notes (Signed)
Renal Duplex Completed. Howard Jackson D  

## 2012-11-02 ENCOUNTER — Telehealth (INDEPENDENT_AMBULATORY_CARE_PROVIDER_SITE_OTHER): Payer: Self-pay | Admitting: General Surgery

## 2012-11-02 ENCOUNTER — Other Ambulatory Visit (INDEPENDENT_AMBULATORY_CARE_PROVIDER_SITE_OTHER): Payer: Self-pay | Admitting: Surgery

## 2012-11-02 ENCOUNTER — Encounter (INDEPENDENT_AMBULATORY_CARE_PROVIDER_SITE_OTHER): Payer: Self-pay

## 2012-11-02 NOTE — Telephone Encounter (Signed)
Patient called back and was informed cardiac clearance was received and to stop taking Plavix 5 days prior to surgery.  Patient states understanding at this time.  Patient also informed that he will receive a phone call from the schedulers in the next few days to be able to schedule his surgery, patient agreeable.

## 2012-11-02 NOTE — Telephone Encounter (Signed)
LMOM for patient to call back and ask for [facility]. Going to let patient know that we have cardiac clearance for surgery and to let him know that he will need to stop his Plavix 5 days before surgery

## 2012-11-11 ENCOUNTER — Encounter (HOSPITAL_COMMUNITY): Payer: Self-pay

## 2012-11-11 ENCOUNTER — Ambulatory Visit (HOSPITAL_COMMUNITY)
Admission: RE | Admit: 2012-11-11 | Discharge: 2012-11-11 | Disposition: A | Discharge status: Home / Self Care | Payer: Medicare Other | Source: Ambulatory Visit | Attending: Anesthesiology | Admitting: Anesthesiology

## 2012-11-11 ENCOUNTER — Encounter (HOSPITAL_COMMUNITY): Payer: Self-pay | Admitting: Pharmacy Technician

## 2012-11-11 ENCOUNTER — Encounter (HOSPITAL_COMMUNITY)
Admission: RE | Admit: 2012-11-11 | Discharge: 2012-11-11 | Disposition: A | Discharge status: Home / Self Care | Payer: Medicare Other | Source: Ambulatory Visit | Attending: Surgery | Admitting: Surgery

## 2012-11-11 DIAGNOSIS — K449 Diaphragmatic hernia without obstruction or gangrene: Secondary | ICD-10-CM | POA: Insufficient documentation

## 2012-11-11 DIAGNOSIS — Z01812 Encounter for preprocedural laboratory examination: Secondary | ICD-10-CM | POA: Insufficient documentation

## 2012-11-11 DIAGNOSIS — Z01810 Encounter for preprocedural cardiovascular examination: Secondary | ICD-10-CM | POA: Insufficient documentation

## 2012-11-11 DIAGNOSIS — M129 Arthropathy, unspecified: Secondary | ICD-10-CM | POA: Insufficient documentation

## 2012-11-11 DIAGNOSIS — F17200 Nicotine dependence, unspecified, uncomplicated: Secondary | ICD-10-CM | POA: Insufficient documentation

## 2012-11-11 DIAGNOSIS — Z01818 Encounter for other preprocedural examination: Secondary | ICD-10-CM | POA: Insufficient documentation

## 2012-11-11 DIAGNOSIS — I10 Essential (primary) hypertension: Secondary | ICD-10-CM | POA: Insufficient documentation

## 2012-11-11 DIAGNOSIS — D3500 Benign neoplasm of unspecified adrenal gland: Secondary | ICD-10-CM | POA: Insufficient documentation

## 2012-11-11 DIAGNOSIS — Z981 Arthrodesis status: Secondary | ICD-10-CM | POA: Insufficient documentation

## 2012-11-11 DIAGNOSIS — I69959 Hemiplegia and hemiparesis following unspecified cerebrovascular disease affecting unspecified side: Secondary | ICD-10-CM | POA: Insufficient documentation

## 2012-11-11 DIAGNOSIS — K4090 Unilateral inguinal hernia, without obstruction or gangrene, not specified as recurrent: Secondary | ICD-10-CM | POA: Insufficient documentation

## 2012-11-11 DIAGNOSIS — M5126 Other intervertebral disc displacement, lumbar region: Secondary | ICD-10-CM | POA: Insufficient documentation

## 2012-11-11 DIAGNOSIS — I701 Atherosclerosis of renal artery: Secondary | ICD-10-CM | POA: Insufficient documentation

## 2012-11-11 HISTORY — DX: Other complications of anesthesia, initial encounter: T88.59XA

## 2012-11-11 HISTORY — DX: Bunion of unspecified foot: M21.619

## 2012-11-11 HISTORY — DX: Unspecified osteoarthritis, unspecified site: M19.90

## 2012-11-11 HISTORY — DX: Other hammer toe(s) (acquired), unspecified foot: M20.40

## 2012-11-11 HISTORY — DX: Adverse effect of unspecified anesthetic, initial encounter: T41.45XA

## 2012-11-11 HISTORY — DX: Myoneural disorder, unspecified: G70.9

## 2012-11-11 HISTORY — DX: Disorder of adrenal gland, unspecified: E27.9

## 2012-11-11 HISTORY — DX: Other intervertebral disc displacement, lumbar region: M51.26

## 2012-11-11 HISTORY — DX: Personal history of other diseases of the digestive system: Z87.19

## 2012-11-11 LAB — CBC
MCH: 33.1 pg (ref 26.0–34.0)
MCV: 94.1 fL (ref 78.0–100.0)
Platelets: 208 10*3/uL (ref 150–400)
RBC: 4.72 MIL/uL (ref 4.22–5.81)
RDW: 12.7 % (ref 11.5–15.5)
WBC: 3.9 10*3/uL — ABNORMAL LOW (ref 4.0–10.5)

## 2012-11-11 LAB — SURGICAL PCR SCREEN: MRSA, PCR: NEGATIVE

## 2012-11-11 LAB — BASIC METABOLIC PANEL
Calcium: 9.9 mg/dL (ref 8.4–10.5)
Creatinine, Ser: 1 mg/dL (ref 0.50–1.35)
GFR calc non Af Amer: 75 mL/min — ABNORMAL LOW (ref >90)
Sodium: 135 mEq/L (ref 135–145)

## 2012-11-11 NOTE — Pre-Procedure Instructions (Addendum)
Howard Jackson  11/11/2012   Your procedure is scheduled on:  11/18/2012  Report to [facility] at 5:30 AM.  Call this number if you have problems the morning of surgery: [phone]   Remember: ENTER NEW BUILDING,  MORNING OF SURGERY,  Entrance- A   Do not eat food or drink liquids after midnight.   Take these medicines the morning of surgery with A SIP OF WATER: clonidine, labetalol, nifedipine,  Per Dr. Feliberto Gottron.- take spironolactone, losartan a.m. Of surgery   Do not wear jewelry  Do not wear lotions, powders, or perfumes. You may wear deodorant.   Men may shave face and neck.  Do not bring valuables to the hospital.  Contacts, dentures or bridgework may not be worn into surgery.  Leave suitcase in the car. After surgery it may be brought to your room.  For patients admitted to the hospital, checkout time is 11:00 AM the day of  discharge.   Patients discharged the day of surgery will not be allowed to drive  home.  Name and phone number of your driver: w daughter  Special Instructions: Shower using CHG 2 nights before surgery and the night before surgery.  If you shower the day of surgery use CHG.  Use special wash - you have one bottle of CHG for all showers.  You should use approximately 1/3 of the bottle for each shower.   Please read over the following fact sheets that you were given: Pain Booklet, Coughing and Deep Breathing, MRSA Information and Surgical Site Infection Prevention

## 2012-11-11 NOTE — Progress Notes (Addendum)
Call to Dr. Hazle Coca office, requested EKG, last office note. Spoke with [employer]

## 2012-11-11 NOTE — Anesthesia Preprocedure Evaluation (Addendum)
Anesthesia Evaluation  Patient identified by MRN, date of birth, ID band Patient awake    Reviewed: Allergy & Precautions, H&P , NPO status , Patient's Chart, lab work & pertinent test results, reviewed documented beta blocker date and time   Airway Mallampati: II TM Distance: >3 FB Neck ROM: full    Dental   Pulmonary neg pulmonary ROS,  breath sounds clear to auscultation        Cardiovascular hypertension, Pt. on medications + CAD and + Cardiac Stents Rhythm:regular     Neuro/Psych  Neuromuscular disease CVA, Residual Symptoms negative psych ROS   GI/Hepatic Neg liver ROS, hiatal hernia,   Endo/Other  pheochromocytoma  Renal/GU negative Renal ROS  negative genitourinary   Musculoskeletal   Abdominal   Peds  Hematology negative hematology ROS (+)   Anesthesia Other Findings See surgeon's H&P   Reproductive/Obstetrics negative OB ROS                           Anesthesia Physical Anesthesia Plan  ASA: III  Anesthesia Plan: MAC   Post-op Pain Management:    Induction: Intravenous  Airway Management Planned: Simple Face Mask  Additional Equipment:   Intra-op Plan:   Post-operative Plan:   Informed Consent: I have reviewed the patients History and Physical, chart, labs and discussed the procedure including the risks, benefits and alternatives for the proposed anesthesia with the patient or authorized representative who has indicated his/her understanding and acceptance.   Dental Advisory Given  Plan Discussed with: CRNA and Surgeon  Anesthesia Plan Comments:         Anesthesia Quick Evaluation

## 2012-11-11 NOTE — Progress Notes (Signed)
Anesthesia PAT Evaluation:  Patient is a 67 year old male scheduled for left inguinal hernia repair by Dr. Magnus Ivan on 11/18/12.  Procedure is posted for MAC anesthesia due to his of pheochromocytoma.  History is significant for pheochromocytoma (medically managed by Dr. Sharl Ma; on spironolactone), CAD s/p LAD stent '02 and 4/11/1 and RCA stents X 2 10/19/09, cervical fusion, non-smoker, hiatal hernia, arthritis, herniated lumbar disc, CVA '99 with left hemiparesis and spasticity, 50-60% left renal artery stenosis by angiography '11.  He reports that he nearly died on the OR table during his cervical fusion in 1989--now believed to be from "too much medication" to treat hypertension that was later found related to a pheochromocytoma. PCP is Dr. Lerry Liner.    Cardiologist will be Dr. Rubie Maid, but he was most recently evaluated by Dr. Allyson Sabal for preoperative cardiac clearance. He was felt to be acceptable risk and was told that he should hold his Plavix five days prior to surgery.  Patient had a normal nuclear stress test on 10/14/12.  EF 77%.  He had two cardiac catheterizations in April 2011 that included interventions to his RCA and LAD.    EKG on 11/11/12 showed SB, LVH, borderline LAD.    CXR on 11/11/12 showed no edema or consolidation.  Preoperative labs noted. Cr 1.06.    I reviewed procedure and patient's history of pheochromocytoma with anesthesiologist Dr. Katrinka Blazing.  Due to patient's history of medically managed pheochromocytoma, he was instructed to take all his BP medications as usual including losartan and spironolactone.    Velna Ochs [facility]/Anesthesiology Phone [phone] 11/11/2012 5:27 PM

## 2012-11-11 NOTE — Progress Notes (Signed)
Pt. Reports that he has been getting care for his shoulder & L arm & hand, for pain at [facility].  Pt. Reveals, he fell yesterday, backward, states he hurt his hand & shoulder further, but he eventually got up on his own, little by little. He states he his arm & shoulder are back to baseline today.  Pt. states an episode of hypotension preceded the fall yesterday.

## 2012-11-11 NOTE — Progress Notes (Signed)
Report to A. Zelenak,PAC, relative to pt,. Remark about "dying on the table" in 1989 during cerv. Fusion in [place], [facility].  Further investigation showed adrenal gland tumor, Pheochromocytoma, follow /w Dr. Sharl Ma.

## 2012-11-15 ENCOUNTER — Telehealth (INDEPENDENT_AMBULATORY_CARE_PROVIDER_SITE_OTHER): Payer: Self-pay

## 2012-11-15 NOTE — Telephone Encounter (Signed)
Pt calling with concerns of having home health help him after surgery.  (Date of surgery 11/18/12 = Inguinal hernia repair) Advised patient that Dr. Magnus Ivan would order home health upon discharge from the hospital if patient needed it.  Pt verbalized understanding and agrees with plan.

## 2012-11-17 MED ORDER — CEFAZOLIN SODIUM-DEXTROSE 2-3 GM-% IV SOLR
2.0000 g | INTRAVENOUS | Status: DC
  Filled 2012-11-17: qty 50

## 2012-11-18 ENCOUNTER — Ambulatory Visit (HOSPITAL_COMMUNITY): Payer: Medicare Other | Admitting: Vascular Surgery

## 2012-11-18 ENCOUNTER — Encounter (HOSPITAL_COMMUNITY): Payer: Self-pay | Admitting: Vascular Surgery

## 2012-11-18 ENCOUNTER — Encounter (HOSPITAL_COMMUNITY)
Admission: RE | Disposition: A | Discharge status: Home / Self Care | Payer: Self-pay | Source: Ambulatory Visit | Attending: Surgery

## 2012-11-18 ENCOUNTER — Encounter (HOSPITAL_COMMUNITY): Payer: Self-pay | Admitting: *Deleted

## 2012-11-18 ENCOUNTER — Observation Stay (HOSPITAL_COMMUNITY)
Admission: RE | Admit: 2012-11-18 | Discharge: 2012-11-19 | Disposition: A | Discharge status: Home / Self Care | Payer: Medicare Other | Source: Ambulatory Visit | Attending: Surgery | Admitting: Surgery

## 2012-11-18 DIAGNOSIS — I2510 Atherosclerotic heart disease of native coronary artery without angina pectoris: Secondary | ICD-10-CM | POA: Insufficient documentation

## 2012-11-18 DIAGNOSIS — I10 Essential (primary) hypertension: Secondary | ICD-10-CM | POA: Insufficient documentation

## 2012-11-18 DIAGNOSIS — Z8673 Personal history of transient ischemic attack (TIA), and cerebral infarction without residual deficits: Secondary | ICD-10-CM | POA: Insufficient documentation

## 2012-11-18 DIAGNOSIS — K4090 Unilateral inguinal hernia, without obstruction or gangrene, not specified as recurrent: Secondary | ICD-10-CM

## 2012-11-18 HISTORY — PX: INGUINAL HERNIA REPAIR: SHX194

## 2012-11-18 HISTORY — PX: INSERTION OF MESH: SHX5868

## 2012-11-18 SURGERY — REPAIR, HERNIA, INGUINAL, ADULT
Anesthesia: Monitor Anesthesia Care | Laterality: Left | Wound class: Clean

## 2012-11-18 MED ORDER — SODIUM CHLORIDE 0.9 % IV SOLN
INTRAVENOUS | Status: DC
  Administered 2012-11-18 (×2): via INTRAVENOUS

## 2012-11-18 MED ORDER — PROPOFOL 10 MG/ML IV BOLUS
INTRAVENOUS | Status: DC | PRN
  Administered 2012-11-18: 20 mg via INTRAVENOUS

## 2012-11-18 MED ORDER — MSM 1000 MG PO TABS: 500.0000 mg | ORAL_TABLET | Freq: Every day | ORAL | Status: DC

## 2012-11-18 MED ORDER — BUPIVACAINE HCL (PF) 0.5 % IJ SOLN
INTRAMUSCULAR | Status: AC
  Filled 2012-11-18: qty 30

## 2012-11-18 MED ORDER — HYDROCODONE-ACETAMINOPHEN 5-325 MG PO TABS
1.0000 | ORAL_TABLET | ORAL | Status: DC | PRN
  Administered 2012-11-18: 2 via ORAL
  Administered 2012-11-18: 1 via ORAL
  Administered 2012-11-19 (×2): 2 via ORAL
  Filled 2012-11-18: qty 1
  Filled 2012-11-18 (×2): qty 2
  Filled 2012-11-18: qty 1
  Filled 2012-11-18: qty 2

## 2012-11-18 MED ORDER — PROPOFOL INFUSION 10 MG/ML OPTIME
INTRAVENOUS | Status: DC | PRN
  Administered 2012-11-18: 25 ug/kg/min via INTRAVENOUS

## 2012-11-18 MED ORDER — ONDANSETRON HCL 4 MG/2ML IJ SOLN: 4.0000 mg | Freq: Four times a day (QID) | INTRAMUSCULAR | Status: DC | PRN

## 2012-11-18 MED ORDER — OXYCODONE HCL 5 MG PO TABS: 5.0000 mg | ORAL_TABLET | Freq: Once | ORAL | Status: DC | PRN

## 2012-11-18 MED ORDER — TIZANIDINE HCL 4 MG PO TABS
8.0000 mg | ORAL_TABLET | Freq: Three times a day (TID) | ORAL | Status: DC | PRN
  Administered 2012-11-18: 8 mg via ORAL
  Filled 2012-11-18: qty 2

## 2012-11-18 MED ORDER — HYDROMORPHONE HCL PF 1 MG/ML IJ SOLN: 0.2500 mg | INTRAMUSCULAR | Status: DC | PRN

## 2012-11-18 MED ORDER — 0.9 % SODIUM CHLORIDE (POUR BTL) OPTIME
TOPICAL | Status: DC | PRN
  Administered 2012-11-18: 1000 mL

## 2012-11-18 MED ORDER — CLONIDINE HCL 0.1 MG PO TABS
0.1000 mg | ORAL_TABLET | Freq: Two times a day (BID) | ORAL | Status: DC
  Administered 2012-11-18 – 2012-11-19 (×2): 0.1 mg via ORAL
  Filled 2012-11-18 (×4): qty 1

## 2012-11-18 MED ORDER — ENOXAPARIN SODIUM 30 MG/0.3ML ~~LOC~~ SOLN
30.0000 mg | SUBCUTANEOUS | Status: DC
  Administered 2012-11-19: 30 mg via SUBCUTANEOUS
  Filled 2012-11-18 (×2): qty 0.3

## 2012-11-18 MED ORDER — MORPHINE SULFATE 4 MG/ML IJ SOLN: 4.0000 mg | INTRAMUSCULAR | Status: DC | PRN

## 2012-11-18 MED ORDER — LABETALOL HCL 300 MG PO TABS
300.0000 mg | ORAL_TABLET | Freq: Two times a day (BID) | ORAL | Status: DC
  Administered 2012-11-18 – 2012-11-19 (×2): 300 mg via ORAL
  Filled 2012-11-18 (×3): qty 1

## 2012-11-18 MED ORDER — OXYCODONE HCL 5 MG/5ML PO SOLN: 5.0000 mg | Freq: Once | ORAL | Status: DC | PRN

## 2012-11-18 MED ORDER — ONDANSETRON HCL 4 MG PO TABS: 4.0000 mg | ORAL_TABLET | Freq: Four times a day (QID) | ORAL | Status: DC | PRN

## 2012-11-18 MED ORDER — BUPIVACAINE HCL (PF) 0.5 % IJ SOLN
INTRAMUSCULAR | Status: DC | PRN
  Administered 2012-11-18: 24 mL

## 2012-11-18 MED ORDER — NIFEDIPINE ER 30 MG PO TB24
30.0000 mg | ORAL_TABLET | Freq: Every day | ORAL | Status: DC
  Administered 2012-11-18 – 2012-11-19 (×2): 30 mg via ORAL
  Filled 2012-11-18 (×5): qty 1

## 2012-11-18 MED ORDER — LOSARTAN POTASSIUM 50 MG PO TABS: 100.0000 mg | ORAL_TABLET | Freq: Every day | ORAL | Status: DC

## 2012-11-18 MED ORDER — VITAMIN B-6 100 MG PO TABS
100.0000 mg | ORAL_TABLET | Freq: Every day | ORAL | Status: DC
  Administered 2012-11-18 – 2012-11-19 (×2): 100 mg via ORAL
  Filled 2012-11-18 (×2): qty 1

## 2012-11-18 MED ORDER — SENNOSIDES-DOCUSATE SODIUM 8.6-50 MG PO TABS
2.0000 | ORAL_TABLET | Freq: Every day | ORAL | Status: DC
  Administered 2012-11-18: 2 via ORAL
  Filled 2012-11-18: qty 2

## 2012-11-18 MED ORDER — MIDAZOLAM HCL 5 MG/5ML IJ SOLN
INTRAMUSCULAR | Status: DC | PRN
  Administered 2012-11-18: 2 mg via INTRAVENOUS

## 2012-11-18 MED ORDER — LACTATED RINGERS IV SOLN
INTRAVENOUS | Status: DC | PRN
  Administered 2012-11-18: 07:00:00 via INTRAVENOUS

## 2012-11-18 MED ORDER — SPIRONOLACTONE 50 MG PO TABS
50.0000 mg | ORAL_TABLET | Freq: Every day | ORAL | Status: DC
Start: 2012-11-19 — End: 2012-11-19
  Administered 2012-11-19: 50 mg via ORAL
  Filled 2012-11-18: qty 1

## 2012-11-18 MED ORDER — METOCLOPRAMIDE HCL 5 MG/ML IJ SOLN: 10.0000 mg | Freq: Once | INTRAMUSCULAR | Status: DC | PRN

## 2012-11-18 MED ORDER — LOSARTAN POTASSIUM 50 MG PO TABS
100.0000 mg | ORAL_TABLET | Freq: Every day | ORAL | Status: DC
  Administered 2012-11-19: 100 mg via ORAL
  Filled 2012-11-18: qty 1
  Filled 2012-11-18: qty 2

## 2012-11-18 MED ORDER — TAMSULOSIN HCL 0.4 MG PO CAPS
0.4000 mg | ORAL_CAPSULE | Freq: Every day | ORAL | Status: DC
  Administered 2012-11-18 – 2012-11-19 (×2): 0.4 mg via ORAL
  Filled 2012-11-18 (×2): qty 1

## 2012-11-18 MED ORDER — FENTANYL CITRATE 0.05 MG/ML IJ SOLN
INTRAMUSCULAR | Status: DC | PRN
  Administered 2012-11-18: 100 ug via INTRAVENOUS
  Administered 2012-11-18 (×3): 50 ug via INTRAVENOUS

## 2012-11-18 MED ORDER — SENNA-DOCUSATE SODIUM 8.6-50 MG PO TABS: 2.0000 | ORAL_TABLET | Freq: Every evening | ORAL | Status: DC

## 2012-11-18 SURGICAL SUPPLY — 43 items
BENZOIN TINCTURE PRP APPL 2/3 (GAUZE/BANDAGES/DRESSINGS) ×2 IMPLANT
BLADE SURG 10 STRL SS (BLADE) ×2 IMPLANT
BLADE SURG 15 STRL LF DISP TIS (BLADE) ×1 IMPLANT
BLADE SURG 15 STRL SS (BLADE) ×1
BLADE SURG ROTATE 9660 (MISCELLANEOUS) IMPLANT
CHLORAPREP W/TINT 26ML (MISCELLANEOUS) ×2 IMPLANT
CLOTH BEACON ORANGE TIMEOUT ST (SAFETY) ×2 IMPLANT
COVER SURGICAL LIGHT HANDLE (MISCELLANEOUS) ×2 IMPLANT
DRAIN PENROSE 1/2X12 LTX STRL (WOUND CARE) IMPLANT
DRAPE LAPAROTOMY TRNSV 102X78 (DRAPE) ×2 IMPLANT
DRESSING TELFA 8X3 (GAUZE/BANDAGES/DRESSINGS) ×2 IMPLANT
DRSG TEGADERM 4X4.75 (GAUZE/BANDAGES/DRESSINGS) ×2 IMPLANT
ELECT CAUTERY BLADE 6.4 (BLADE) ×2 IMPLANT
ELECT REM PT RETURN 9FT ADLT (ELECTROSURGICAL) ×2
ELECTRODE REM PT RTRN 9FT ADLT (ELECTROSURGICAL) ×1 IMPLANT
GLOVE BIO SURGEON STRL SZ 6.5 (GLOVE) ×2 IMPLANT
GLOVE BIOGEL PI IND STRL 6.5 (GLOVE) ×1 IMPLANT
GLOVE BIOGEL PI IND STRL 7.5 (GLOVE) ×1 IMPLANT
GLOVE BIOGEL PI INDICATOR 6.5 (GLOVE) ×1
GLOVE BIOGEL PI INDICATOR 7.5 (GLOVE) ×1
GLOVE SURG SIGNA 7.5 PF LTX (GLOVE) ×2 IMPLANT
GOWN PREVENTION PLUS XLARGE (GOWN DISPOSABLE) ×2 IMPLANT
GOWN STRL NON-REIN LRG LVL3 (GOWN DISPOSABLE) ×2 IMPLANT
KIT BASIN OR (CUSTOM PROCEDURE TRAY) ×2 IMPLANT
KIT ROOM TURNOVER OR (KITS) ×2 IMPLANT
MESH PARIETEX PROGRIP LEFT (Mesh General) ×2 IMPLANT
NEEDLE HYPO 25GX1X1/2 BEV (NEEDLE) ×2 IMPLANT
NS IRRIG 1000ML POUR BTL (IV SOLUTION) ×2 IMPLANT
PACK SURGICAL SETUP 50X90 (CUSTOM PROCEDURE TRAY) ×2 IMPLANT
PAD ARMBOARD 7.5X6 YLW CONV (MISCELLANEOUS) ×4 IMPLANT
PENCIL BUTTON HOLSTER BLD 10FT (ELECTRODE) ×2 IMPLANT
SPECIMEN JAR SMALL (MISCELLANEOUS) IMPLANT
SPONGE LAP 18X18 X RAY DECT (DISPOSABLE) ×2 IMPLANT
STRIP CLOSURE SKIN 1/2X4 (GAUZE/BANDAGES/DRESSINGS) ×2 IMPLANT
SUT MON AB 4-0 PC3 18 (SUTURE) ×2 IMPLANT
SUT SILK 2 0 SH (SUTURE) ×2 IMPLANT
SUT VIC AB 2-0 CT1 27 (SUTURE) ×2
SUT VIC AB 2-0 CT1 TAPERPNT 27 (SUTURE) ×2 IMPLANT
SUT VIC AB 3-0 CT1 27 (SUTURE) ×1
SUT VIC AB 3-0 CT1 TAPERPNT 27 (SUTURE) ×1 IMPLANT
SYR CONTROL 10ML LL (SYRINGE) ×2 IMPLANT
TOWEL OR 17X24 6PK STRL BLUE (TOWEL DISPOSABLE) ×2 IMPLANT
TOWEL OR 17X26 10 PK STRL BLUE (TOWEL DISPOSABLE) ×2 IMPLANT

## 2012-11-18 NOTE — Transfer of Care (Signed)
Immediate Anesthesia Transfer of Care Note  Patient: Howard Jackson  Procedure(s) Performed: Procedure(s): HERNIA REPAIR INGUINAL ADULT (Left) INSERTION OF MESH (Left)  Patient Location: PACU  Anesthesia Type:MAC  Level of Consciousness: awake, alert , oriented and sedated  Airway & Oxygen Therapy: Patient Spontanous Breathing and Patient connected to nasal cannula oxygen  Post-op Assessment: Report given to PACU RN, Post -op Vital signs reviewed and stable and Patient moving all extremities  Post vital signs: Reviewed and stable  Complications: No apparent anesthesia complications

## 2012-11-18 NOTE — Op Note (Signed)
NAMESHAYE, LAGACE NO.:  [id number]  MEDICAL RECORD NO.:  [id number]  LOCATION:  6N17C                        FACILITY:  [facility]  PHYSICIAN:  Abigail Miyamoto, M.D. DATE OF BIRTH:  [birth year]  DATE OF PROCEDURE:  11/18/2012 DATE OF DISCHARGE:                              OPERATIVE REPORT   PREOPERATIVE DIAGNOSIS:  Left inguinal hernia.  POSTOPERATIVE DIAGNOSIS:  Left inguinal hernia.  PROCEDURE:  Left inguinal hernia repair with mesh.  SURGEON:  Abigail Miyamoto, M.D.  ANESTHESIA:  Marcaine 0.5% and monitored anesthesia care.  ESTIMATED BLOOD LOSS:  Minimal.  PROCEDURE IN DETAIL:  The patient was brought to the operating room, identified as Howard Jackson.  He was placed supine on the operating room table and anesthesia was induced.  His left lower quadrant and groin were then prepped and draped in the usual sterile fashion.  I performed an ilioinguinal nerve block with 0.5% Marcaine.  I anesthetized skin into the left lower quadrant with Marcaine as well.  I then performed a longitudinal incision with a scalpel.  I took this down through Scarpa fascia with the electrocautery.  The patient's external oblique fascia was basically gone.  The patient had a large direct hernia defect.  I was able to control the testicular cord and structures with a Penrose drain.  The patient also had a small indirect hernia.  I was able to reduce the hernia sac and closed the soft tissue over the bulge with interrupted 2-0 Vicryl sutures.  I then separated the indirect hernia sac from the cord structures and tied off the base after reducing the contents with a silk suture.  I then excised the redundant sac.  I then brought a piece of Parietex ProGrip mesh onto the field.  I placed it around the cord structures, and down to the inguinal floor, reconstructing the floor.  I then sewed it in place with several 2-0 Vicryl sutures.  Good recreation of the inguinal floor appeared  to be achieved as well as coverage of the cord structures.  Again, there was no external oblique fascia to close over top of the mesh.  I approximated some soft tissue over this mesh.  I anesthetized the skin and fascia further with Marcaine.  I then closed the Scarpa fascia with interrupted 3-0 Vicryl sutures and closed the skin with a running 4-0 Monocryl.  Steri-Strips, gauze, and Tegaderm were then applied.  The patient tolerated the procedure well.  All the counts were correct at the end of the procedure.  The patient was then awakened in the operating room and taken in a stable condition to the recovery room.    Abigail Miyamoto, M.D.    DB/MEDQ  D:  11/18/2012  T:  11/18/2012  Job:  [id number]

## 2012-11-18 NOTE — Preoperative (Signed)
Beta Blockers   Reason not to administer Beta Blockers:Not Applicable 

## 2012-11-18 NOTE — Progress Notes (Signed)
PHARMACIST - PHYSICIAN ORDER COMMUNICATION  CONCERNING: P&T Medication Policy on Herbal Medications  DESCRIPTION:  This patient's order for:  Methylsulfonylmethane (MSM) has been noted.  This product(s) is classified as an "herbal" or natural product. Due to a lack of definitive safety studies or FDA approval, nonstandard manufacturing practices, plus the potential risk of unknown drug-drug interactions while on inpatient medications, the Pharmacy and Therapeutics Committee does not permit the use of "herbal" or natural products of this type within [facility].   ACTION TAKEN: The pharmacy department is unable to verify this order at this time and your patient has been informed of this safety policy. Please reevaluate patient's clinical condition at discharge and address if the herbal or natural product(s) should be resumed at that time.  Wilfred Lacy, PharmD Clinical Pharmacist [phone] 11/18/2012, 10:15 AM

## 2012-11-18 NOTE — Interval H&P Note (Signed)
History and Physical Interval Note: no change  11/18/2012 6:49 AM  Howard Jackson  has presented today for surgery, with the diagnosis of left inguinal hernia  The various methods of treatment have been discussed with the patient and family. After consideration of risks, benefits and other options for treatment, the patient has consented to  Procedure(s): HERNIA REPAIR INGUINAL ADULT (Left) INSERTION OF MESH (Left) as a surgical intervention .  The patient's history has been reviewed, patient examined, no change in status, stable for surgery.  I have reviewed the patient's chart and labs.  Questions were answered to the patient's satisfaction.     Howard Jackson A

## 2012-11-18 NOTE — Op Note (Signed)
HERNIA REPAIR INGUINAL ADULT, INSERTION OF MESH  Procedure Note  Howard Jackson 11/18/2012   Pre-op Diagnosis: left inguinal hernia     Post-op Diagnosis: same  Procedure(s): HERNIA REPAIR INGUINAL ADULT INSERTION OF MESH  Surgeon(s): Shelly Rubenstein, MD  Anesthesia: Monitor Anesthesia Care  Staff:  Circulator: Sudie Bailey, RN Scrub Person: Janeece Agee Pingue, CST  Estimated Blood Loss: Minimal                         Merilyn Pagan A   Date: 11/18/2012  Time: 8:16 AM

## 2012-11-18 NOTE — H&P (Signed)
Patient ID: Howard Jackson, male DOB: [birth year], 67 y.o. MRN: [id number]  Chief Complaint   Patient presents with   .  Other     possible inguinal hernia   HPI  Howard Jackson is a 67 y.o. male.  HPI  This is a pleasant gentleman referred by Dr. Lerry Liner for evaluation of a symptomatic left inguinal hernia. He has had the hernia for many years but is now causing sharp pain and discomfort for the last 2-3 months. He denies nausea or vomiting or obstructive symptoms. He has a significant past medical history and reports he even has a pheochromocytoma.  Past Medical History   Diagnosis  Date   .  Stroke    .  Hypertension    .  Coronary artery disease     Past Surgical History   Procedure  Laterality  Date   .  Cervical fusion     .  Coronary angioplasty with stent placement       pt reports having 5 stents in his heart   History reviewed. No pertinent family history.  Social History  History   Substance Use Topics   .  Smoking status:  Never Smoker   .  Smokeless tobacco:  Not on file   .  Alcohol Use:  No    Allergies   Allergen  Reactions   .  Elavil (Amitriptyline Hcl)  Hypertension    Current Outpatient Prescriptions   Medication  Sig  Dispense  Refill   .  Acetylcysteine 600 MG CAPS  Take by mouth.     .  ASTRAGALUS PO  Take by mouth.     .  cloNIDine (CATAPRES) 0.2 MG tablet  Take 0.1-0.2 mg by mouth 2 (two) times daily. Takes 1 tablet in the morning, and 0.5 tablet in the evening     .  clopidogrel (PLAVIX) 75 MG tablet  Take 75 mg by mouth daily.     .  Coenzyme Q10 (CO Q 10 PO)  Take by mouth.     .  cyclobenzaprine (FLEXERIL) 5 MG tablet  Take 5 mg by mouth 3 (three) times daily as needed for muscle spasms.     .  DEVILS CLAW PO  Take by mouth.     Marland Kitchen  HYDROcodone-acetaminophen (LORTAB) 10-500 MG per tablet  Take 1 tablet by mouth every 6 (six) hours as needed.     .  indomethacin (INDOCIN) 25 MG capsule  Take 25 mg by mouth 2 (two) times daily with a meal.      .  labetalol (NORMODYNE) 300 MG tablet  Take 300 mg by mouth 2 (two) times daily.     .  Misc Natural Products (COLON CARE PO)  Take by mouth.     Marland Kitchen  NIFEdipine (PROCARDIA-XL/ADALAT CC) 60 MG 24 hr tablet  Take 60 mg by mouth every evening.     .  sertraline (ZOLOFT) 50 MG tablet  Take 50 mg by mouth daily.     .  simvastatin (ZOCOR) 20 MG tablet  Take 20 mg by mouth daily.     Marland Kitchen  spironolactone (ALDACTONE) 50 MG tablet  Take 50 mg by mouth 2 (two) times daily.     .  Tamsulosin HCl (FLOMAX) 0.4 MG CAPS  Take 0.4 mg by mouth daily.     Marland Kitchen  tiZANidine (ZANAFLEX) 4 MG tablet  Take 4 mg by mouth every 6 (six) hours as needed. Shoulder pain  No current facility-administered medications for this visit.   Review of Systems  Review of Systems  Constitutional: Negative for fever, chills and unexpected weight change.  HENT: Negative for hearing loss, congestion, sore throat, trouble swallowing and voice change.  Eyes: Negative for visual disturbance.  Respiratory: Negative for cough and wheezing.  Cardiovascular: Negative for chest pain, palpitations and leg swelling.  Gastrointestinal: Positive for abdominal pain. Negative for nausea, vomiting, diarrhea, constipation, blood in stool, abdominal distention, anal bleeding and rectal pain.  Endocrine: Positive for polyuria.  Genitourinary: Negative for hematuria and difficulty urinating.  Musculoskeletal: Negative for arthralgias.  Skin: Negative for rash and wound.  Neurological: Positive for weakness. Negative for seizures, syncope and headaches.  Hematological: Negative for adenopathy. Does not bruise/bleed easily.  Psychiatric/Behavioral: Negative for confusion.  Blood pressure 126/80, pulse 74, resp. rate 16, height 5\' 6"  (1.676 m), weight 169 lb 12.8 oz (77.021 kg).  Physical Exam  Physical Exam  Constitutional: He is oriented to person, place, and time. He appears well-developed and well-nourished. No distress.  HENT:  Head:  Normocephalic and atraumatic.  Right Ear: External ear normal.  Nose: Nose normal.  Mouth/Throat: Oropharynx is clear and moist.  Eyes: Conjunctivae are normal. Pupils are equal, round, and reactive to light. Right eye exhibits no discharge. Left eye exhibits no discharge. No scleral icterus.  Neck: Normal range of motion. Neck supple. No JVD present. No tracheal deviation present. No thyromegaly present.  Cardiovascular: Normal rate, regular rhythm, normal heart sounds and intact distal pulses.  No murmur heard.  Pulmonary/Chest: Effort normal and breath sounds normal. No respiratory distress. He has no wheezes. He has no rales.  Abdominal: Soft. Bowel sounds are normal. He exhibits no distension. There is no tenderness. There is no rebound.  Moderate-sized left inguinal hernia which is reducible. There is no right inguinal hernia  Musculoskeletal: He exhibits no edema and no tenderness.  Abnormality of his left upper and lower extremity from his stroke  Lymphadenopathy:  He has no cervical adenopathy.  Neurological: He is alert and oriented to person, place, and time.  Decreased motor function of the left upper and lower extremity  Skin: Skin is warm and dry. No rash noted. He is not diaphoretic. No erythema.  Psychiatric: His behavior is normal. Judgment normal.  Data Reviewed  Assessment   Left inguinal hernia  Plan   Repair with mesh is recommended secondary to his discomfort. I discussed the procedure in detail.  I discussed the risks which include but are not limited to bleeding, infection, injury to surrounding structures, nerve entrapment, chronic pain, recurrence, cardiopulmonary issues given his pheo, etc.  Cardiac clearance was obtained preop.  He agrees to proceed.  Likelihood of success is good.

## 2012-11-18 NOTE — Progress Notes (Signed)
Rn called Md about patient's pulse rate being in the high 40s and 50. Md said because of patient history he still wants patient receive all of his blood pressure medications. Will continue to monitor patient.

## 2012-11-18 NOTE — Anesthesia Postprocedure Evaluation (Signed)
Anesthesia Post Note  Patient: Howard Jackson  Procedure(s) Performed: Procedure(s) (LRB): HERNIA REPAIR INGUINAL ADULT (Left) INSERTION OF MESH (Left)  Anesthesia type: MAC  Patient location: PACU  Post pain: Pain level controlled  Post assessment: Patient's Cardiovascular Status Stable  Last Vitals:  Filed Vitals:   11/18/12 0830  BP: 97/61  Pulse: 52  Temp:   Resp: 9    Post vital signs: Reviewed and stable  Level of consciousness: alert  Complications: No apparent anesthesia complications

## 2012-11-19 ENCOUNTER — Encounter (HOSPITAL_COMMUNITY): Payer: Self-pay | Admitting: Surgery

## 2012-11-19 MED ORDER — HYDROCODONE-ACETAMINOPHEN 5-325 MG PO TABS: 1.0000 | ORAL_TABLET | ORAL | Status: DC | PRN

## 2012-11-19 NOTE — Care Management Note (Signed)
  Page 1 of 1   11/19/2012     9:53:30 AM   CARE MANAGEMENT NOTE 11/19/2012  Patient:  Howard Jackson, Howard Jackson   Account Number:  [id number]  Date Initiated:  11/19/2012  Documentation initiated by:  Ronny Flurry  Subjective/Objective Assessment:     Action/Plan:   Anticipated DC Date:  11/19/2012   Anticipated DC Plan:  HOME W HOME HEALTH SERVICES         Choice offered to / List presented to:  C-1 Patient        HH arranged  HH-1 RN  HH-4 NURSE'S AIDE  HH-6 SOCIAL WORKER      HH agency  [facility]   Status of service:  Completed, signed off Medicare Important Message given?   (If response is "NO", the following Medicare IM given date fields will be blank) Date Medicare IM given:   Date Additional Medicare IM given:    Discharge Disposition:  HOME W HOME HEALTH SERVICES  Per UR Regulation:  Reviewed for med. necessity/level of care/duration of stay  If discussed at Long Length of Stay Meetings, dates discussed:    Comments:  11-19-12 Facesheet information confirmed with patient. Ronny Flurry RN BSN [phone]

## 2012-11-19 NOTE — Discharge Summary (Signed)
Physician Discharge Summary  Patient ID: Howard Jackson MRN: [id number] DOB/AGE: [birth year] 68 y.o.  Admit date: 11/18/2012 Discharge date: 11/19/2012  Admission Diagnoses:  Discharge Diagnoses:  Active Problems:   * No active hospital problems. * left inguinial hernia  Discharged Condition: good  Hospital Course: uneventful post op recovery.  Discharged POD#1  Consults: None  Significant Diagnostic Studies:   Treatments: surgery:   Discharge Exam: Blood pressure 146/71, pulse 72, temperature 98.2 F (36.8 C), temperature source Oral, resp. rate 16, height 5\' 7"  (1.702 m), weight 164 lb 11.2 oz (74.707 kg), SpO2 98.00%. General appearance: alert and no distress Resp: clear to auscultation bilaterally Cardio: regular rate and rhythm, S1, S2 normal, no murmur, click, rub or gallop Incision/Wound: clean  Disposition: 01-Home or Self Care     Medication List    TAKE these medications       cloNIDine 0.2 MG tablet  Commonly known as:  CATAPRES  Take 0.1 mg by mouth 2 (two) times daily.     clopidogrel 75 MG tablet  Commonly known as:  PLAVIX  Take 75 mg by mouth daily after breakfast.     COLON CARE PO  Take 2 capsules by mouth every evening.     HYDROcodone-acetaminophen 10-500 MG per tablet  Commonly known as:  LORTAB  Take 1 tablet by mouth every 12 (twelve) hours as needed for pain. For pain     HYDROcodone-acetaminophen 5-325 MG per tablet  Commonly known as:  NORCO/VICODIN  Take 1-2 tablets by mouth every 4 (four) hours as needed.     labetalol 300 MG tablet  Commonly known as:  NORMODYNE  Take 300 mg by mouth 2 (two) times daily.     losartan 100 MG tablet  Commonly known as:  COZAAR  Take 100 mg by mouth daily.     MSM 1000 MG Tabs  Take 500 mg by mouth daily.     NIFEdipine 30 MG 24 hr tablet  Commonly known as:  PROCARDIA-XL/ADALAT-CC/NIFEDICAL-XL  Take 30 mg by mouth daily.     pyridOXINE 100 MG tablet  Commonly known as:  VITAMIN B-6  Take  100 mg by mouth daily.     sennosides-docusate sodium 8.6-50 MG tablet  Commonly known as:  SENOKOT-S  Take 2 tablets by mouth every evening.     sertraline 50 MG tablet  Commonly known as:  ZOLOFT  Take 50 mg by mouth daily as needed. For anxiety/depression     simvastatin 20 MG tablet  Commonly known as:  ZOCOR  Take 20 mg by mouth daily.     spironolactone 50 MG tablet  Commonly known as:  ALDACTONE  Take 50 mg by mouth daily.     tamsulosin 0.4 MG Caps  Commonly known as:  FLOMAX  Take 0.4 mg by mouth daily.     tiZANidine 4 MG tablet  Commonly known as:  ZANAFLEX  Take 4-8 mg by mouth at bedtime as needed. For muscle spasms     traMADol 50 MG tablet  Commonly known as:  ULTRAM  Take 50 mg by mouth 2 (two) times daily.           Follow-up Information   Follow up with [facility] A, MD. Call in 2 weeks.   Contact information:   [address] [place] [address] [phone]       Signed: Shelly Rubenstein 11/19/2012, 6:35 AM

## 2012-11-19 NOTE — Progress Notes (Signed)
1232m discharged . Wheeled to lobby by Agricultural consultant

## 2012-11-19 NOTE — Progress Notes (Signed)
Patient ID: Howard Jackson, male   DOB: [birth year], 67 y.o.   MRN: [id number]   Doing well Comfortable Abdomen soft  Plan:  discharge

## 2012-11-22 ENCOUNTER — Other Ambulatory Visit (INDEPENDENT_AMBULATORY_CARE_PROVIDER_SITE_OTHER): Payer: Self-pay | Admitting: Surgery

## 2012-11-22 ENCOUNTER — Telehealth (INDEPENDENT_AMBULATORY_CARE_PROVIDER_SITE_OTHER): Payer: Self-pay | Admitting: *Deleted

## 2012-11-22 DIAGNOSIS — K4090 Unilateral inguinal hernia, without obstruction or gangrene, not specified as recurrent: Secondary | ICD-10-CM

## 2012-11-22 NOTE — Telephone Encounter (Signed)
That’s fine with me

## 2012-11-22 NOTE — Telephone Encounter (Signed)
Called and left message for Howard Jackson to go ahead with PT and OT.  Asked her to call me back if she needed more than a verbal order.

## 2012-11-22 NOTE — Telephone Encounter (Signed)
Mardene Celeste with home health called to ask about getting approval for PT and OT evaluation for this patient.

## 2012-11-25 ENCOUNTER — Telehealth (INDEPENDENT_AMBULATORY_CARE_PROVIDER_SITE_OTHER): Payer: Self-pay | Admitting: General Surgery

## 2012-11-25 NOTE — Telephone Encounter (Signed)
Spoke with patient to let him know that his [facility] nurse  [facility] would still be treating him he declined Home health PT  OT will still evaluate him tomorrow to see if he needs these services .

## 2012-12-08 ENCOUNTER — Ambulatory Visit (INDEPENDENT_AMBULATORY_CARE_PROVIDER_SITE_OTHER): Payer: Medicare Other | Admitting: Surgery

## 2012-12-08 ENCOUNTER — Encounter (INDEPENDENT_AMBULATORY_CARE_PROVIDER_SITE_OTHER): Payer: Self-pay | Admitting: Surgery

## 2012-12-08 VITALS — BP 139/88 | HR 78 | Temp 98.6°F | Resp 18 | Ht 67.0 in | Wt 171.6 lb

## 2012-12-08 DIAGNOSIS — Z09 Encounter for follow-up examination after completed treatment for conditions other than malignant neoplasm: Secondary | ICD-10-CM

## 2012-12-08 NOTE — Progress Notes (Signed)
Subjective:     Patient ID: Howard Jackson, male   DOB: 1945-12-02, 67 y.o.   MRN: [id number]  HPI He is here for his first postop visit status post open left inguinal hernia repair with mesh. This was done with minimal anesthesia given his pheo.  He is still having moderate postoperative discomfort into the testicle. His constipation is resolving. At the time of surgery, his inguinal floor was completely blown out and there was sigmoid colon involved in the hernia sac  Review of Systems     Objective:   Physical Exam On exam, his incision is well-healed. There is postoperative swelling without evidence of infection or recurrence    Assessment:     Patient stable postop     Plan:     I am not surprised at the amount of swelling. This will improve over time. He may resume normal activity in one week. I will see him back as needed.

## 2013-03-25 ENCOUNTER — Encounter: Payer: Self-pay | Admitting: Cardiovascular Disease

## 2013-03-25 ENCOUNTER — Ambulatory Visit (INDEPENDENT_AMBULATORY_CARE_PROVIDER_SITE_OTHER): Payer: Medicare Other | Admitting: Cardiovascular Disease

## 2013-03-25 VITALS — BP 132/78 | HR 54 | Ht 67.0 in | Wt 169.9 lb

## 2013-03-25 DIAGNOSIS — I6529 Occlusion and stenosis of unspecified carotid artery: Secondary | ICD-10-CM

## 2013-03-25 DIAGNOSIS — I10 Essential (primary) hypertension: Secondary | ICD-10-CM

## 2013-03-25 DIAGNOSIS — I658 Occlusion and stenosis of other precerebral arteries: Secondary | ICD-10-CM

## 2013-03-25 DIAGNOSIS — E785 Hyperlipidemia, unspecified: Secondary | ICD-10-CM

## 2013-03-25 DIAGNOSIS — N529 Male erectile dysfunction, unspecified: Secondary | ICD-10-CM

## 2013-03-25 DIAGNOSIS — I2510 Atherosclerotic heart disease of native coronary artery without angina pectoris: Secondary | ICD-10-CM

## 2013-03-25 DIAGNOSIS — I739 Peripheral vascular disease, unspecified: Secondary | ICD-10-CM

## 2013-03-25 DIAGNOSIS — I6523 Occlusion and stenosis of bilateral carotid arteries: Secondary | ICD-10-CM

## 2013-03-25 DIAGNOSIS — Z8673 Personal history of transient ischemic attack (TIA), and cerebral infarction without residual deficits: Secondary | ICD-10-CM

## 2013-03-25 DIAGNOSIS — I701 Atherosclerosis of renal artery: Secondary | ICD-10-CM

## 2013-03-25 DIAGNOSIS — Z01818 Encounter for other preprocedural examination: Secondary | ICD-10-CM

## 2013-03-25 NOTE — Patient Instructions (Addendum)
Your physician has recommended you make the following change in your medication:  Starting tomorrow reduce clonidine to 0.1 mg (half a tablet) every morning and 0.1 mg (half a tablet) every night Starting on Monday, September 15 stop taking clonidine altogether  Your physician has requested that you have a lower extremity arterial duplex. This test is an ultrasound of the arteries in the legs. It looks at arterial blood flow in the legs and arms. Allow one hour for Lower  Arterial scans. There are no restrictions or special instructions  Your physician recommends that you schedule a follow-up appointment in: 3 weeks with Dr. Royann Shivers  Your physician recommends that you schedule a follow-up appointment in: With Dr. Nanetta Batty to discuss the results of the lower extremity arterial Doppler study

## 2013-03-31 DIAGNOSIS — I2510 Atherosclerotic heart disease of native coronary artery without angina pectoris: Secondary | ICD-10-CM | POA: Insufficient documentation

## 2013-03-31 DIAGNOSIS — I6930 Unspecified sequelae of cerebral infarction: Secondary | ICD-10-CM | POA: Insufficient documentation

## 2013-03-31 DIAGNOSIS — N529 Male erectile dysfunction, unspecified: Secondary | ICD-10-CM | POA: Insufficient documentation

## 2013-03-31 DIAGNOSIS — I739 Peripheral vascular disease, unspecified: Secondary | ICD-10-CM | POA: Insufficient documentation

## 2013-03-31 DIAGNOSIS — I701 Atherosclerosis of renal artery: Secondary | ICD-10-CM | POA: Insufficient documentation

## 2013-03-31 DIAGNOSIS — I6529 Occlusion and stenosis of unspecified carotid artery: Secondary | ICD-10-CM | POA: Insufficient documentation

## 2013-03-31 DIAGNOSIS — Z8673 Personal history of transient ischemic attack (TIA), and cerebral infarction without residual deficits: Secondary | ICD-10-CM | POA: Insufficient documentation

## 2013-03-31 DIAGNOSIS — E785 Hyperlipidemia, unspecified: Secondary | ICD-10-CM | POA: Insufficient documentation

## 2013-03-31 DIAGNOSIS — I10 Essential (primary) hypertension: Secondary | ICD-10-CM | POA: Insufficient documentation

## 2013-03-31 NOTE — Assessment & Plan Note (Signed)
Unfortunately, he has very severe infrapopliteal disease and he should undergo a repeat evaluation of left ankle brachial index prior to any surgical procedure to assess potential for healing.

## 2013-03-31 NOTE — Assessment & Plan Note (Signed)
Very recent duplex ultrasonography in April of this year did not show meaningful stenosis that could be contributing to his hypertension.

## 2013-03-31 NOTE — Assessment & Plan Note (Signed)
Most recent lipid profile shows excellent parameters on current medical for

## 2013-03-31 NOTE — Progress Notes (Signed)
Patient ID: Howard Jackson, male   DOB: [birth year], 67 y.o.   MRN: [id number]     Reason for office visit CAD, PVD, preoperative evaluation, erectile dysfunction  From a cardiac standpoint surgeon has done remarkably well without any recent cardiac complaints. He specifically denies any exertional chest pain or dyspnea. He has hallux valgus and second hammertoe on the left side: he is anticipating surgery by Dr.Sikora. Unfortunately he has a history of severe PAD primarily with infrapopliteal disease. He is known to have bilateral single vessel runoff with occlusion of both anterior tibials and both peroneal arteries. His ABI on the left is 1.01 in 2008. There was only a less than 50% stenosis in the distal left superficial femoral artery without other significant pelvic or femoral disease.  He has prominent complaints of intermittent orthostatic hypotension. He is actually decided by himself to reduce the evening dose of clonidine to 0.1 mg since he often is very lightheaded when he gets out of bed in the morning. During the day his blood pressure is usually well controlled. Notes that he is not taking sertraline although this was listed on his medication list.  His other prominent complaint is a long-standing issue of erectile dysfunction. He has had problems with this for at least the last 15 years. He is using a pump but complains of the problem is using this paraphernalia. He has previously tried treatment with Cialis but was intolerant of its side effects.    Allergies  Allergen Reactions  . Elavil [Amitriptyline Hcl] Hypertension    Current Outpatient Prescriptions  Medication Sig Dispense Refill  . cloNIDine (CATAPRES) 0.2 MG tablet Take 0.1 mg by mouth 2 (two) times daily.       . clopidogrel (PLAVIX) 75 MG tablet Take 75 mg by mouth daily after breakfast.       . HYDROcodone-acetaminophen (LORTAB) 10-500 MG per tablet Take 1 tablet by mouth every 12 (twelve) hours as needed for pain.  For pain      . HYDROcodone-acetaminophen (NORCO/VICODIN) 5-325 MG per tablet Take 1-2 tablets by mouth every 4 (four) hours as needed.  30 tablet  1  . labetalol (NORMODYNE) 300 MG tablet Take 300 mg by mouth 2 (two) times daily.       Marland Kitchen losartan (COZAAR) 100 MG tablet Take 100 mg by mouth daily.      . Methylsulfonylmethane (MSM) 1000 MG TABS Take 500 mg by mouth daily.      . Misc Natural Products (COLON CARE PO) Take 2 capsules by mouth every evening.       Marland Kitchen NIFEdipine (PROCARDIA-XL/ADALAT-CC/NIFEDICAL-XL) 30 MG 24 hr tablet Take 30 mg by mouth daily.      Marland Kitchen pyridOXINE (VITAMIN B-6) 100 MG tablet Take 100 mg by mouth daily.      . sennosides-docusate sodium (SENOKOT-S) 8.6-50 MG tablet Take 2 tablets by mouth every evening.       . simvastatin (ZOCOR) 20 MG tablet Take 20 mg by mouth daily.       Marland Kitchen spironolactone (ALDACTONE) 50 MG tablet Take 50 mg by mouth daily.       . Tamsulosin HCl (FLOMAX) 0.4 MG CAPS Take 0.4 mg by mouth daily.        Marland Kitchen tiZANidine (ZANAFLEX) 4 MG tablet Take 4-8 mg by mouth at bedtime as needed. For muscle spasms      . traMADol (ULTRAM) 50 MG tablet Take 50 mg by mouth 2 (two) times daily as needed.       Marland Kitchen  sertraline (ZOLOFT) 50 MG tablet Take 50 mg by mouth daily as needed. For anxiety/depression       No current facility-administered medications for this visit.    Past Medical History  Diagnosis Date  . Hypertension   . Adrenal gland disorder     tumor present - no change-  no recent increase    . Stroke     in [place]- 1999, L sided weakness, wears a brace on L leg & uses cane  . H/O hiatal hernia   . Neuromuscular disorder     carpal tunnel problem, even after surgery release   . Arthritis   . Hammer toe   . Bunion     left foot  . Herniated lumbar intervertebral disc   . Coronary artery disease     cleared for surg. by Dr. Allyson Sabal - [facility]  . Complication of anesthesia     "died on the table during cervical fusion" '89 - believed to be d/t "too much  medication" to treat HTN and was later dx with pheochromocytoma    Past Surgical History  Procedure Laterality Date  . Cervical fusion    . Coronary angioplasty with stent placement      pt reports having 5 stents in his heart-2011  . Carpal tunnel release      bilateral   . Inguinal hernia repair Left 11/18/2012    Procedure: HERNIA REPAIR INGUINAL ADULT;  Surgeon: Shelly Rubenstein, MD;  Location: [facility] OR;  Service: General;  Laterality: Left;  . Insertion of mesh Left 11/18/2012    Procedure: INSERTION OF MESH;  Surgeon: Shelly Rubenstein, MD;  Location: [facility] OR;  Service: General;  Laterality: Left;  . Hernia repair      No family history on file.  History   Social History  . Marital Status: Legally Separated    Spouse Name: N/A    Number of Children: N/A  . Years of Education: N/A   Occupational History  . Not on file.   Social History Main Topics  . Smoking status: Never Smoker   . Smokeless tobacco: Not on file  . Alcohol Use: Yes     Comment: social - one drink, on special occasion   . Drug Use: No  . Sexual Activity: Not on file   Other Topics Concern  . Not on file   Social History Narrative  . No narrative on file    Review of systems: Orthostatic dizziness, very rare episodes of ankle swelling, erectile dysfunction, spasticity of the left upper extremity. Uncomfortable left foot to do to bunion and hammertoe with calluses. The patient specifically denies any chest pain at rest or with exertion, dyspnea at rest or with exertion, orthopnea, paroxysmal nocturnal dyspnea, syncope, palpitations, new focal neurological deficits, intermittent claudication,  unexplained weight gain, cough, hemoptysis or wheezing.  The patient also denies abdominal pain, nausea, vomiting, dysphagia, diarrhea, constipation, polyuria, polydipsia, dysuria, hematuria, frequency, urgency, abnormal bleeding or bruising, fever, chills, unexpected weight changes, mood swings, change in skin or  hair texture, change in voice quality, auditory or visual problems, allergic reactions or rashes, new musculoskeletal complaints other than usual "aches and pains".   PHYSICAL EXAM BP 132/78  Pulse 54  Ht 5\' 7"  (1.702 m)  Wt 169 lb 14.4 oz (77.066 kg)  BMI 26.6 kg/m2  General: Alert, oriented x3, no distress Head: no evidence of trauma, PERRL, EOMI, no exophtalmos or lid lag, no myxedema, no xanthelasma; normal ears, nose and oropharynx Neck:  normal jugular venous pulsations and no hepatojugular reflux; brisk carotid pulses without delay and soft bilateral carotid bruits Chest: clear to auscultation, no signs of consolidation by percussion or palpation, normal fremitus, symmetrical and full respiratory excursions Cardiovascular: normal position and quality of the apical impulse, regular rhythm, normal first and second heart sounds, no murmurs, rubs or gallops Abdomen: no tenderness or distention, no masses by palpation, no abnormal pulsatility or arterial bruits, normal bowel sounds, no hepatosplenomegaly Extremities: no clubbing, cyanosis or edema; 2+ radial, ulnar and brachial pulses bilaterally; 2+ right femoral, posterior tibial and absent dorsalis pedis pulses; 2+ left femoral, posterior tibial and absent dorsalis pedis pulses; no subclavian or femoral bruits Neurological: grossly nonfocal   EKG: Normal sinus rhythm with sinus arrhythmia otherwise normal  Lipid Panel     Component Value Date/Time   CHOL  Value: 120        ATP III CLASSIFICATION:  <200     mg/dL   Desirable  409-811  mg/dL   Borderline High  >=914    mg/dL   High        7/82/9562 0340   TRIG 55 10/21/2009 0340   HDL 46 10/21/2009 0340   CHOLHDL 2.6 10/21/2009 0340   VLDL 11 10/21/2009 0340   LDLCALC  Value: 63        Total Cholesterol/HDL:CHD Risk Coronary Heart Disease Risk Table                     Men   Women  1/2 Average Risk   3.4   3.3  Average Risk       5.0   4.4  2 X Average Risk   9.6   7.1  3 X Average Risk   23.4   11.0        Use the calculated Patient Ratio above and the CHD Risk Table to determine the patient's CHD Risk.        ATP III CLASSIFICATION (LDL):  <100     mg/dL   Optimal  130-865  mg/dL   Near or Above                    Optimal  130-159  mg/dL   Borderline  784-696  mg/dL   High  >295     mg/dL   Very High 2/84/1324 4010    BMET    Component Value Date/Time   NA 135 11/11/2012 1359   K 4.3 11/11/2012 1359   CL 100 11/11/2012 1359   CO2 25 11/11/2012 1359   GLUCOSE 105* 11/11/2012 1359   BUN 17 11/11/2012 1359   CREATININE 1.00 11/11/2012 1359   CALCIUM 9.9 11/11/2012 1359   GFRNONAA 75* 11/11/2012 1359   GFRAA 87* 11/11/2012 1359     ASSESSMENT AND PLAN HTN (hypertension) Prominent symptoms of orthostatic hypotension. Reduce clonidine to 0.1 mg twice a day. Note previous negative workup for pheochromocytoma and renal artery stenosis. 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 I am not certain whether he was on ACE inhibitors or angiotensin receptor blockers at the time. It is reasonable to continue spironolactone, consider workup for adrenal adenoma only if blood pressure control becomes unsuccessful with medical means.  Erectile dysfunction Indeed, multiple medications may be contributing to this. We'll also review for evidence of worsening iliac artery stenosis. Discuss options for future therapy with Dr. Earlene Plater. Note history of previous intolerance to phosphodiesterase inhibitors (nasal congestion and dyspepsia with Cialis). Unfortunately  he has very severe systemic hypertension requiring multiple agents. Clonidine is theoretically the worst offender.  CAD (coronary artery disease) Multiple previous revascularization procedures performed to the LAD artery, most recently in 2011. Currently asymptomatic. Normal recent myocardial perfusion scan.  Renal artery stenosis Very recent duplex ultrasonography in April of this year did not show meaningful stenosis  that could be contributing to his hypertension.  Dyslipidemia Most recent lipid profile shows excellent parameters on current medical for  Carotid stenosis History of previous right hemispheric stroke with residual spastic left hemiparesis.  PAD (peripheral artery disease) Unfortunately, he has very severe infrapopliteal disease and he should undergo a repeat evaluation of left ankle brachial index prior to any surgical procedure to assess potential for healing.  Orders Placed This Encounter  Procedures  . EKG 12-Lead  . Lower Extremity Arterial Duplex Bilateral   No orders of the defined types were placed in this encounter.    Junious Silk, MD, [facility] [phone] office [phone] pager

## 2013-03-31 NOTE — Assessment & Plan Note (Signed)
Indeed, multiple medications may be contributing to this. We'll also review for evidence of worsening iliac artery stenosis. Discuss options for future therapy with Dr. Earlene Plater. Note history of previous intolerance to phosphodiesterase inhibitors (nasal congestion and dyspepsia with Cialis). Unfortunately he has very severe systemic hypertension requiring multiple agents. Clonidine is theoretically the worst offender.

## 2013-03-31 NOTE — Assessment & Plan Note (Addendum)
Prominent symptoms of orthostatic hypotension. Reduce clonidine to 0.1 mg twice a day. Note previous negative workup for pheochromocytoma and renal artery stenosis. 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 I am not certain whether he was on ACE inhibitors or angiotensin receptor blockers at the time. It is reasonable to continue spironolactone, consider workup for adrenal adenoma only if blood pressure control becomes unsuccessful with medical means.

## 2013-03-31 NOTE — Assessment & Plan Note (Signed)
History of previous right hemispheric stroke with residual spastic left hemiparesis.

## 2013-03-31 NOTE — Assessment & Plan Note (Signed)
Multiple previous revascularization procedures performed to the LAD artery, most recently in 2011. Currently asymptomatic. Normal recent myocardial perfusion scan.

## 2013-04-01 ENCOUNTER — Ambulatory Visit (HOSPITAL_COMMUNITY)
Admission: RE | Admit: 2013-04-01 | Discharge: 2013-04-01 | Disposition: A | Discharge status: Home / Self Care | Payer: Medicare Other | Source: Ambulatory Visit | Attending: Cardiovascular Disease | Admitting: Cardiovascular Disease

## 2013-04-01 DIAGNOSIS — I739 Peripheral vascular disease, unspecified: Secondary | ICD-10-CM

## 2013-04-01 DIAGNOSIS — Z01818 Encounter for other preprocedural examination: Secondary | ICD-10-CM

## 2013-04-01 DIAGNOSIS — R0989 Other specified symptoms and signs involving the circulatory and respiratory systems: Secondary | ICD-10-CM

## 2013-04-01 DIAGNOSIS — I70219 Atherosclerosis of native arteries of extremities with intermittent claudication, unspecified extremity: Secondary | ICD-10-CM

## 2013-04-01 NOTE — Progress Notes (Signed)
Arterial Lower Ext. Duplex Completed. Jules Vidovich, BS, RDMS, RVT  

## 2013-04-12 ENCOUNTER — Encounter (HOSPITAL_COMMUNITY): Payer: Medicare Other

## 2013-04-25 ENCOUNTER — Encounter: Payer: Self-pay | Admitting: Cardiovascular Disease

## 2013-04-25 ENCOUNTER — Ambulatory Visit (INDEPENDENT_AMBULATORY_CARE_PROVIDER_SITE_OTHER): Payer: Medicare Other | Admitting: Cardiovascular Disease

## 2013-04-25 VITALS — BP 166/80 | HR 64 | Ht 66.0 in | Wt 166.8 lb

## 2013-04-25 DIAGNOSIS — I2510 Atherosclerotic heart disease of native coronary artery without angina pectoris: Secondary | ICD-10-CM

## 2013-04-25 DIAGNOSIS — I739 Peripheral vascular disease, unspecified: Secondary | ICD-10-CM

## 2013-04-25 DIAGNOSIS — I10 Essential (primary) hypertension: Secondary | ICD-10-CM

## 2013-04-25 NOTE — Assessment & Plan Note (Signed)
He has a left hammertoe and hallux valgus on the left side. His lower HME arterial Doppler studies performed 04/01/13 revealed ABIs of 8.7 range with absent tibial vessels on the left making foot surgery problematic with regards to healing.

## 2013-04-25 NOTE — Patient Instructions (Signed)
Your physician wants you to follow-up in: 1 year with Dr Berry. You will receive a reminder letter in the mail two months in advance. If you don't receive a letter, please call our office to schedule the follow-up appointment.  

## 2013-04-25 NOTE — Assessment & Plan Note (Signed)
Denies chest pain or shortness of breath

## 2013-04-25 NOTE — Assessment & Plan Note (Signed)
On multiple antihypertensive medications. I believe his blood pressure is elevated today because of pain in his left foot.

## 2013-04-25 NOTE — Progress Notes (Signed)
04/25/2013 Howard Jackson   1945/12/24  [id number]  Primary Physician Jearld Lesch, MD Primary Cardiologist: Runell Gess MD Howard Jackson   HPI:  The patient is a 67 year old thin-appearing separated [place] male, father of 3, grandfather to 3 grandchildren, previously a patient of Dr. Sandria Senter [facility]. He is referred for cardiac clearance before elective inguinal hernia repair performed by Dr. Magnus Ivan. He was last seen by Dr. Lynnea Ferrier 3 years ago. He has a history of hypertension and hyperlipidemia. He has had an ischemic stroke back in 1999 and has known CAD, status post stenting of his LAD and RCA with Promus drug-eluting stents by Dr. Tresa Endo in 2011. He also had a 50% to 60% left renal artery stenosis at that time. His last Myoview, performed May 22, 2010, was nonischemic. He denies chest pain or shortness of breath.he does have a hammertoe on the left and housed August. Lotion with Doppler studies performed in our office 04/01/13 revealed a left ABI of 0.7 with absent tibial vessels. I do not think it safe for him to undergo foot surgery given his poor tibial circulation and potential for lack of healing.    Current Outpatient Prescriptions  Medication Sig Dispense Refill  . clopidogrel (PLAVIX) 75 MG tablet Take 75 mg by mouth daily after breakfast.       . HYDROcodone-acetaminophen (LORTAB) 10-500 MG per tablet Take 1 tablet by mouth every 12 (twelve) hours as needed for pain. For pain      . HYDROcodone-acetaminophen (NORCO/VICODIN) 5-325 MG per tablet Take 1-2 tablets by mouth every 4 (four) hours as needed.  30 tablet  1  . labetalol (NORMODYNE) 300 MG tablet Take 300 mg by mouth 2 (two) times daily.       Marland Kitchen losartan (COZAAR) 100 MG tablet Take 100 mg by mouth daily.      . Methylsulfonylmethane (MSM) 1000 MG TABS Take 500 mg by mouth daily.      . Misc Natural Products (COLON CARE PO) Take 2 capsules by mouth every evening.       Marland Kitchen NIFEdipine  (PROCARDIA-XL/ADALAT-CC/NIFEDICAL-XL) 30 MG 24 hr tablet Take 30 mg by mouth daily.      Marland Kitchen pyridOXINE (VITAMIN B-6) 100 MG tablet Take 100 mg by mouth daily.      . sennosides-docusate sodium (SENOKOT-S) 8.6-50 MG tablet Take 2 tablets by mouth every evening.       . simvastatin (ZOCOR) 20 MG tablet Take 20 mg by mouth daily.       Marland Kitchen spironolactone (ALDACTONE) 50 MG tablet Take 50 mg by mouth daily.       . Tamsulosin HCl (FLOMAX) 0.4 MG CAPS Take 0.4 mg by mouth daily.        Marland Kitchen tiZANidine (ZANAFLEX) 4 MG tablet Take 4-8 mg by mouth at bedtime as needed. For muscle spasms      . traMADol (ULTRAM) 50 MG tablet Take 50 mg by mouth 2 (two) times daily as needed.        No current facility-administered medications for this visit.    Allergies  Allergen Reactions  . Elavil [Amitriptyline Hcl] Hypertension    History   Social History  . Marital Status: Legally Separated    Spouse Name: N/A    Number of Children: N/A  . Years of Education: N/A   Occupational History  . Not on file.   Social History Main Topics  . Smoking status: Never Smoker   . Smokeless tobacco: Never Used  .  Alcohol Use: Yes     Comment: social - one drink, on special occasion   . Drug Use: No  . Sexual Activity: Not on file   Other Topics Concern  . Not on file   Social History Narrative  . No narrative on file     Review of Systems: General: negative for chills, fever, night sweats or weight changes.  Cardiovascular: negative for chest pain, dyspnea on exertion, edema, orthopnea, palpitations, paroxysmal nocturnal dyspnea or shortness of breath Dermatological: negative for rash Respiratory: negative for cough or wheezing Urologic: negative for hematuria Abdominal: negative for nausea, vomiting, diarrhea, bright red blood per rectum, melena, or hematemesis Neurologic: negative for visual changes, syncope, or dizziness All other systems reviewed and are otherwise negative except as noted  above.    Blood pressure 166/80, pulse 64, height 5\' 6"  (1.676 m), weight 166 lb 12.8 oz (75.66 kg).  General appearance: alert and no distress Neck: no adenopathy, no carotid bruit, no JVD, supple, symmetrical, trachea midline and thyroid not enlarged, symmetric, no tenderness/mass/nodules Lungs: clear to auscultation bilaterally Heart: regular rate and rhythm, S1, S2 normal, no murmur, click, rub or gallop Extremities: extremities normal, atraumatic, no cyanosis or edema  EKG not performed today  ASSESSMENT AND PLAN:   CAD (coronary artery disease) Denies chest pain or shortness of breath  HTN (hypertension) On multiple antihypertensive medications. I believe his blood pressure is elevated today because of pain in his left foot.  PAD (peripheral artery disease) He has a left hammertoe and hallux valgus on the left side. His lower HME arterial Doppler studies performed 04/01/13 revealed ABIs of 8.7 range with absent tibial vessels on the left making foot surgery problematic with regards to healing.      Runell Gess MD FACP,FACC,FAHA, [facility] 04/25/2013 3:06 PM

## 2013-10-12 ENCOUNTER — Other Ambulatory Visit (HOSPITAL_COMMUNITY): Payer: Self-pay | Admitting: Cardiovascular Disease

## 2013-10-12 DIAGNOSIS — I701 Atherosclerosis of renal artery: Secondary | ICD-10-CM

## 2013-10-31 ENCOUNTER — Ambulatory Visit (HOSPITAL_COMMUNITY)
Admission: RE | Admit: 2013-10-31 | Discharge: 2013-10-31 | Disposition: A | Discharge status: Home / Self Care | Payer: Medicare Other | Source: Ambulatory Visit | Attending: Cardiovascular Disease | Admitting: Cardiovascular Disease

## 2013-10-31 DIAGNOSIS — I10 Essential (primary) hypertension: Secondary | ICD-10-CM

## 2013-10-31 DIAGNOSIS — I701 Atherosclerosis of renal artery: Secondary | ICD-10-CM | POA: Insufficient documentation

## 2013-10-31 NOTE — Progress Notes (Signed)
Renal Duplex Completed. Kendall Arnell, BS, RDMS, RVT  

## 2014-05-16 ENCOUNTER — Encounter: Payer: Self-pay | Admitting: Cardiovascular Disease

## 2014-05-16 ENCOUNTER — Ambulatory Visit (INDEPENDENT_AMBULATORY_CARE_PROVIDER_SITE_OTHER): Payer: Medicare Other | Admitting: Cardiovascular Disease

## 2014-05-16 VITALS — BP 106/60 | HR 56 | Ht 67.0 in | Wt 171.0 lb

## 2014-05-16 DIAGNOSIS — I701 Atherosclerosis of renal artery: Secondary | ICD-10-CM

## 2014-05-16 DIAGNOSIS — E785 Hyperlipidemia, unspecified: Secondary | ICD-10-CM

## 2014-05-16 DIAGNOSIS — I739 Peripheral vascular disease, unspecified: Secondary | ICD-10-CM

## 2014-05-16 DIAGNOSIS — I10 Essential (primary) hypertension: Secondary | ICD-10-CM

## 2014-05-16 DIAGNOSIS — I2510 Atherosclerotic heart disease of native coronary artery without angina pectoris: Secondary | ICD-10-CM

## 2014-05-16 NOTE — Assessment & Plan Note (Signed)
On statin therapy followed by his PCP 

## 2014-05-16 NOTE — Patient Instructions (Signed)
Your physician wants you to follow-up in: 1 year with Dr Berry. You will receive a reminder letter in the mail two months in advance. If you don't receive a letter, please call our office to schedule the follow-up appointment.  

## 2014-05-16 NOTE — Assessment & Plan Note (Signed)
History of peripheral arterial disease with lower extremity Dopplers revealing ABIs in the 0.7 range bilaterally with an occluded right popliteal artery and occluded left tibial arteries. I did not feel it was safe to proceed with any type of foot surgery given the likelihood that he would not heal. He does have claudication as well as resting ischemia while recumbent. I do not think he has percutaneous options at this point though if it was a question of limb salvage we could consider angiography and tibial vessel revascularization.

## 2014-05-16 NOTE — Assessment & Plan Note (Signed)
Controlled on current medications 

## 2014-05-16 NOTE — Progress Notes (Signed)
05/16/2014 Howard Jackson   Nov 25, 1945  [id number]  Primary Physician Harvie Junior, MD Primary Cardiologist: Lorretta Harp MD Renae Gloss   HPI:  The patient is a 68 year old thin-appearing separated [place] male, father of 84, grandfather to 3 grandchildren, previously a patient of Dr. Madison Hickman [facility]. He is referred for cardiac clearance before elective inguinal hernia repair performed by Dr. Ninfa Linden. He was last seen by Dr. Elisabeth Cara 4 years ago.I last saw him in the office one year ago. He has a history of hypertension and hyperlipidemia. He has had an ischemic stroke back in 1999 and has known CAD, status post stenting of his LAD and RCA with Promus drug-eluting stents by Dr. Claiborne Billings in 2011. He also had a 50% to 60% left renal artery stenosis at that time. His last Myoview, performed May 22, 2010, was nonischemic. He denies chest pain or shortness of breath.he does have a hammertoe on the left and housed August. Lotion with Doppler studies performed in our office 04/01/13 revealed a left ABI of 0.7 with absent tibial vessels. I do not think it safe for him to undergo foot surgery given his poor tibial circulation and potential for lack of healing. He does have claudication as well as resting leg pain when recumbent. He does not have critical limb ischemia at this point.   Current Outpatient Prescriptions  Medication Sig Dispense Refill  . baclofen (LIORESAL) 10 MG tablet Take 10 mg by mouth 3 (three) times daily. Takes the baclofen after the Botox injections that occur every 90 days.    . clopidogrel (PLAVIX) 75 MG tablet Take 75 mg by mouth daily after breakfast.     . FLOVENT HFA 110 MCG/ACT inhaler Inhale 1 puff into the lungs 3 (three) times daily.   2  . HYDROcodone-acetaminophen (LORTAB) 10-500 MG per tablet Take 1 tablet by mouth every 12 (twelve) hours as needed for pain. For pain    . HYDROcodone-acetaminophen (NORCO/VICODIN) 5-325 MG per tablet Take 1-2  tablets by mouth every 4 (four) hours as needed. 30 tablet 1  . labetalol (NORMODYNE) 300 MG tablet Take 300 mg by mouth 2 (two) times daily.     Marland Kitchen losartan (COZAAR) 100 MG tablet Take 100 mg by mouth daily.    . Methylsulfonylmethane (MSM) 1000 MG TABS Take 500 mg by mouth daily.    . Misc Natural Products (COLON CARE PO) Take 2 capsules by mouth every evening.     Marland Kitchen NIFEdipine (PROCARDIA-XL/ADALAT-CC/NIFEDICAL-XL) 30 MG 24 hr tablet Take 30 mg by mouth daily.    . OnabotulinumtoxinA (BOTOX IJ) Inject as directed. Every 90 days to fingers and left shoulder    . prazosin (MINIPRESS) 1 MG capsule Take 1 mg by mouth 3 (three) times daily.   5  . pyridOXINE (VITAMIN B-6) 100 MG tablet Take 100 mg by mouth daily.    . sennosides-docusate sodium (SENOKOT-S) 8.6-50 MG tablet Take 2 tablets by mouth every evening.     . simvastatin (ZOCOR) 20 MG tablet Take 20 mg by mouth daily.     Marland Kitchen spironolactone (ALDACTONE) 50 MG tablet Take 50 mg by mouth daily.     . Tamsulosin HCl (FLOMAX) 0.4 MG CAPS Take 0.4 mg by mouth daily.      Marland Kitchen tiZANidine (ZANAFLEX) 4 MG tablet Take 4-8 mg by mouth at bedtime as needed. For muscle spasms    . traMADol (ULTRAM) 50 MG tablet Take 50 mg by mouth 2 (two) times daily as needed.  No current facility-administered medications for this visit.    Allergies  Allergen Reactions  . Elavil [Amitriptyline Hcl] Hypertension    History   Social History  . Marital Status: Legally Separated    Spouse Name: N/A    Number of Children: N/A  . Years of Education: N/A   Occupational History  . Not on file.   Social History Main Topics  . Smoking status: Never Smoker   . Smokeless tobacco: Never Used  . Alcohol Use: Yes     Comment: social - one drink, on special occasion   . Drug Use: No  . Sexual Activity: Not on file   Other Topics Concern  . Not on file   Social History Narrative     Review of Systems: General: negative for chills, fever, night sweats or  weight changes.  Cardiovascular: negative for chest pain, dyspnea on exertion, edema, orthopnea, palpitations, paroxysmal nocturnal dyspnea or shortness of breath Dermatological: negative for rash Respiratory: negative for cough or wheezing Urologic: negative for hematuria Abdominal: negative for nausea, vomiting, diarrhea, bright red blood per rectum, melena, or hematemesis Neurologic: negative for visual changes, syncope, or dizziness All other systems reviewed and are otherwise negative except as noted above.    Blood pressure 106/60, pulse 56, height 5\' 7"  (1.702 m), weight 171 lb (77.565 kg).  General appearance: alert and no distress Neck: no adenopathy, no carotid bruit, no JVD, supple, symmetrical, trachea midline and thyroid not enlarged, symmetric, no tenderness/mass/nodules Lungs: clear to auscultation bilaterally Heart: regular rate and rhythm, S1, S2 normal, no murmur, click, rub or gallop Extremities: extremities normal, atraumatic, no cyanosis or edema  EKG sinus bradycardia 56 without ST or T-wave changes  ASSESSMENT AND PLAN:   PAD (peripheral artery disease) History of peripheral arterial disease with lower extremity Dopplers revealing ABIs in the 0.7 range bilaterally with an occluded right popliteal artery and occluded left tibial arteries. I did not feel it was safe to proceed with any type of foot surgery given the likelihood that he would not heal. He does have claudication as well as resting ischemia while recumbent. I do not think he has percutaneous options at this point though if it was a question of limb salvage we could consider angiography and tibial vessel revascularization.  HTN (hypertension) Controlled on current medications  Dyslipidemia On statin therapy followed by his PCP      Lorretta Harp MD [facility], [facility] 05/16/2014 2:09 PM

## 2014-05-29 ENCOUNTER — Encounter: Payer: Self-pay | Admitting: Cardiovascular Disease

## 2014-06-21 ENCOUNTER — Encounter: Payer: Self-pay | Admitting: Cardiovascular Disease

## 2014-06-21 ENCOUNTER — Ambulatory Visit (INDEPENDENT_AMBULATORY_CARE_PROVIDER_SITE_OTHER): Payer: Medicare Other | Admitting: Cardiovascular Disease

## 2014-06-21 VITALS — BP 168/92 | HR 72 | Resp 20 | Ht 67.0 in | Wt 172.1 lb

## 2014-06-21 DIAGNOSIS — N5201 Erectile dysfunction due to arterial insufficiency: Secondary | ICD-10-CM

## 2014-06-21 DIAGNOSIS — I10 Essential (primary) hypertension: Secondary | ICD-10-CM

## 2014-06-21 DIAGNOSIS — I739 Peripheral vascular disease, unspecified: Secondary | ICD-10-CM

## 2014-06-21 DIAGNOSIS — I2510 Atherosclerotic heart disease of native coronary artery without angina pectoris: Secondary | ICD-10-CM

## 2014-06-21 DIAGNOSIS — I701 Atherosclerosis of renal artery: Secondary | ICD-10-CM

## 2014-06-21 DIAGNOSIS — D3501 Benign neoplasm of right adrenal gland: Secondary | ICD-10-CM

## 2014-06-21 MED ORDER — EPLERENONE 25 MG PO TABS: 25.0000 mg | ORAL_TABLET | Freq: Every day | ORAL | Status: DC

## 2014-06-21 NOTE — Patient Instructions (Signed)
STOP the Spironolactone.  Start Inspra 25mg  daily.  Dr. Sallyanne Kuster recommends that you schedule a follow-up appointment in: 3-4 months.

## 2014-06-22 ENCOUNTER — Telehealth: Payer: Self-pay | Admitting: Cardiovascular Disease

## 2014-06-22 NOTE — Telephone Encounter (Signed)
Pt said the new medicine that Dr C prescribed yesterday,his insurance will not pay for it.

## 2014-06-22 NOTE — Telephone Encounter (Signed)
The pt called back wanting to give you the number to [facility] [phone] and he says that the customer service number is [phone]. Please call  Thanks

## 2014-06-22 NOTE — Telephone Encounter (Signed)
Spoke with pt, dr c started him on Inspra yesterday. The patient called to say it needed prior authorization. The pharmacy is supposed to fax something to [place]. He wanted to let us know. He is going to call back with the number on his insurance card to call for assistance.

## 2014-06-22 NOTE — Telephone Encounter (Signed)
Spoke with [place] @ [phone]. They will respond within 72 hours, pt made aware. Ref # is [id number].

## 2014-06-23 ENCOUNTER — Encounter: Payer: Self-pay | Admitting: Cardiovascular Disease

## 2014-06-23 ENCOUNTER — Telehealth: Payer: Self-pay | Admitting: *Deleted

## 2014-06-23 DIAGNOSIS — D3501 Benign neoplasm of right adrenal gland: Secondary | ICD-10-CM

## 2014-06-23 HISTORY — DX: Benign neoplasm of right adrenal gland: D35.01

## 2014-06-23 NOTE — Telephone Encounter (Signed)
Eplerenone 25mg  approved 06/22/14 - 06/23/15 through [employer].  Patient notified and voiced understanding.

## 2014-06-23 NOTE — Progress Notes (Signed)
Patient ID: Howard Jackson, male   DOB: March 03, 1946, 68 y.o.   MRN: [id number]      Reason for office visit CAD, PVD, erectile dysfunction  From a cardiac standpoint he continues to do well without any recent cardiac complaints. He specifically denies any exertional chest pain or dyspnea. He had a normal nuclear stress test in April 2014. He reports 5 coronary stents in the past.  He wanted to have foot surgery for hallux valgus, but has severe arterial insufficiency.  He has subtotal right popliteal occlusion with weak monophasic PT and Per flow and occluded AT. On the left all three calf vessels are occluded, with collateral reconstitution of the PT and Per. Right ABI 0.73, left 0.77 (Doppler Sept 2014). There was no significant pelvic or femoral disease.  He no longer has the prominent complaints of intermittent orthostatic hypotension after stopping clonidine, but his BP is high in the office today. 168/92, rechecked 154/90. At home he reports BP 119-135/69-80, with a monitor he states has been verified as accurate. He is taking prazosin now.  He has moderate left renal artery stenosis (50-60% angio 2011, <60% Duplex 2014).  He was reportedly diagnosed with pheochromocytoma after complications of anesthesia in [place] in 1989. An abdominal MRI in 2010 showed a 16 mm right adrenal mass consistent with pheochromocytoma. Also has a 2.4 cm right ischiorectal fossa mass, presumed to be a neurofibroma or schwannoma.  His other prominent complaint is a long-standing issue of erectile dysfunction. He has had problems with this for at least the last 15 years. He wants to stop spironolactone, which also is causing gynecomastia. He has previously tried treatment with Cialis but was intolerant of its side effects.  Carotid US 11/2013 at [facility] showed no significant lesions. He has mild residual right spastic hemiparesis after a remote left hemispheric stroke.  Allergies  Allergen Reactions  . Elavil  [Amitriptyline Hcl] Hypertension    Current Outpatient Prescriptions  Medication Sig Dispense Refill  . baclofen (LIORESAL) 10 MG tablet Take 10 mg by mouth 3 (three) times daily. Takes the baclofen after the Botox injections that occur every 90 days.    . clopidogrel (PLAVIX) 75 MG tablet Take 75 mg by mouth daily after breakfast.     . FLOVENT HFA 110 MCG/ACT inhaler Inhale 1 puff into the lungs 3 (three) times daily.   2  . HYDROcodone-acetaminophen (NORCO) 7.5-325 MG per tablet Take 1 tablet by mouth 2 (two) times daily as needed. for pain  0  . labetalol (NORMODYNE) 300 MG tablet Take 300 mg by mouth 2 (two) times daily.     Marland Kitchen losartan (COZAAR) 100 MG tablet Take 100 mg by mouth daily.    Marland Kitchen losartan-hydrochlorothiazide (HYZAAR) 100-12.5 MG per tablet Take 1 tablet by mouth daily.  3  . meloxicam (MOBIC) 15 MG tablet Take 1 tablet by mouth as needed.  1  . Methylsulfonylmethane (MSM) 1000 MG TABS Take 500 mg by mouth daily.    . Misc Natural Products (COLON CARE PO) Take 2 capsules by mouth every evening.     Marland Kitchen NIFEdipine (PROCARDIA-XL/ADALAT-CC/NIFEDICAL-XL) 30 MG 24 hr tablet Take 30 mg by mouth daily.    . OnabotulinumtoxinA (BOTOX IJ) Inject as directed. Every 90 days to fingers and left shoulder    . prazosin (MINIPRESS) 1 MG capsule Take 1 mg by mouth 3 (three) times daily.   5  . pyridOXINE (VITAMIN B-6) 100 MG tablet Take 100 mg by mouth daily.    Marland Kitchen  sennosides-docusate sodium (SENOKOT-S) 8.6-50 MG tablet Take 2 tablets by mouth every evening.     . simvastatin (ZOCOR) 40 MG tablet Take 1 tablet by mouth daily.  5  . Tamsulosin HCl (FLOMAX) 0.4 MG CAPS Take 0.4 mg by mouth daily.      Marland Kitchen tiZANidine (ZANAFLEX) 4 MG tablet Take 4-8 mg by mouth at bedtime as needed. For muscle spasms    . traMADol (ULTRAM) 50 MG tablet Take 50 mg by mouth 2 (two) times daily as needed.     Marland Kitchen eplerenone (INSPRA) 25 MG tablet Take 1 tablet (25 mg total) by mouth daily. 30 tablet 6   No current  facility-administered medications for this visit.    Past Medical History  Diagnosis Date  . Hypertension   . Adrenal gland disorder     tumor present - no change-  no recent increase    . Stroke     in [place], L sided weakness, wears a brace on L leg & uses cane  . H/O hiatal hernia   . Neuromuscular disorder     carpal tunnel problem, even after surgery release   . Arthritis   . Hammer toe   . Bunion     left foot  . Herniated lumbar intervertebral disc   . Coronary artery disease     cleared for surg. by Dr. Gwenlyn Found - [facility]  . Complication of anesthesia     "died on the table during cervical fusion" '89 - believed to be d/t "too much medication" to treat HTN and was later dx with pheochromocytoma  . Peripheral arterial disease   . Hyperlipidemia     Past Surgical History  Procedure Laterality Date  . Cervical fusion    . Coronary angioplasty with stent placement      pt reports having 5 stents in his heart-2011  . Carpal tunnel release      bilateral   . Inguinal hernia repair Left 11/18/2012    Procedure: HERNIA REPAIR INGUINAL ADULT;  Surgeon: Harl Bowie, MD;  Location: [place];  Service: General;  Laterality: Left;  . Insertion of mesh Left 11/18/2012    Procedure: INSERTION OF MESH;  Surgeon: Harl Bowie, MD;  Location: [place];  Service: General;  Laterality: Left;  . Hernia repair      No family history on file.  History   Social History  . Marital Status: Legally Separated    Spouse Name: N/A    Number of Children: N/A  . Years of Education: N/A   Occupational History  . Not on file.   Social History Main Topics  . Smoking status: Never Smoker   . Smokeless tobacco: Never Used  . Alcohol Use: Yes     Comment: social - one drink, on special occasion   . Drug Use: No  . Sexual Activity: Not on file   Other Topics Concern  . Not on file   Social History Narrative    Review of systems: erectile dysfunction, spasticity of the left upper  extremity. Uncomfortable left foot to do to bunion and hammertoe with calluses. The patient specifically denies any chest pain at rest or with exertion, dyspnea at rest or with exertion, orthopnea, paroxysmal nocturnal dyspnea, syncope, palpitations, new focal neurological deficits, intermittent claudication, unexplained weight gain, cough, hemoptysis or wheezing.  The patient also denies abdominal pain, nausea, vomiting, dysphagia, diarrhea, constipation, polyuria, polydipsia, dysuria, hematuria, frequency, urgency, abnormal bleeding or bruising, fever, chills, unexpected weight changes,  mood swings, change in skin or hair texture, change in voice quality, auditory or visual problems, allergic reactions or rashes, new musculoskeletal complaints other than usual "aches and pains".  PHYSICAL EXAM BP 168/92 mmHg  Pulse 72  Ht 5\' 7"  (1.702 m)  Wt 172 lb 1.6 oz (78.064 kg)  BMI 26.95 kg/m2 General: Alert, oriented x3, no distress Head: no evidence of trauma, PERRL, EOMI, no exophtalmos or lid lag, no myxedema, no xanthelasma; normal ears, nose and oropharynx Neck: normal jugular venous pulsations and no hepatojugular reflux; brisk carotid pulses without delay and soft bilateral carotid bruits Chest: clear to auscultation, no signs of consolidation by percussion or palpation, normal fremitus, symmetrical and full respiratory excursions Cardiovascular: normal position and quality of the apical impulse, regular rhythm, normal first and second heart sounds, no murmurs, rubs or gallops Abdomen: no tenderness or distention, no masses by palpation, no abnormal pulsatility or arterial bruits, normal bowel sounds, no hepatosplenomegaly Extremities: no clubbing, cyanosis or edema; 2+ radial, ulnar and brachial pulses bilaterally; 2+ right femoral, posterior tibial and absent dorsalis pedis pulses; 2+ left femoral, posterior tibial and absent dorsalis pedis pulses; no subclavian or femoral bruits Neurological:  grossly nonfocal   EKG: Normal sinus rhythm with sinus arrhythmia   Lipid Panel     Component Value Date/Time   CHOL  10/21/2009 0340    120        ATP III CLASSIFICATION:  <200     mg/dL   Desirable  200-239  mg/dL   Borderline High  >=240    mg/dL   High          TRIG 55 10/21/2009 0340   HDL 46 10/21/2009 0340   CHOLHDL 2.6 10/21/2009 0340   VLDL 11 10/21/2009 0340   LDLCALC  10/21/2009 0340    63        Total Cholesterol/HDL:CHD Risk Coronary Heart Disease Risk Table                     Men   Women  1/2 Average Risk   3.4   3.3  Average Risk       5.0   4.4  2 X Average Risk   9.6   7.1  3 X Average Risk  23.4   11.0        Use the calculated Patient Ratio above and the CHD Risk Table to determine the patient's CHD Risk.        ATP III CLASSIFICATION (LDL):  <100     mg/dL   Optimal  100-129  mg/dL   Near or Above                    Optimal  130-159  mg/dL   Borderline  160-189  mg/dL   High  >190     mg/dL   Very High    BMET    Component Value Date/Time   NA 135 11/11/2012 1359   K 4.3 11/11/2012 1359   CL 100 11/11/2012 1359   CO2 25 11/11/2012 1359   GLUCOSE 105* 11/11/2012 1359   BUN 17 11/11/2012 1359   CREATININE 1.00 11/11/2012 1359   CALCIUM 9.9 11/11/2012 1359   GFRNONAA 75* 11/11/2012 1359   GFRAA 87* 11/11/2012 1359     ASSESSMENT AND PLAN  HTN (hypertension) Note previous imaging studies that are consistent with pheochromocytoma and mild-moderate renal artery stenosis, but I am not sure that either one is in play  here. 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 Switch from spironolactone to eplerenone due to side effects.  Erectile dysfunction Indeed, multiple medications and also small vessel atherosclerosis may be contributing to this. Note history of previous intolerance to phosphodiesterase inhibitors (nasal congestion and dyspepsia with Cialis). Unfortunately he has very severe systemic  hypertension requiring multiple agents. He is hopeful that coming off spironolactone will help.  CAD (coronary artery disease) Multiple previous revascularization procedures performed to the LAD artery, most recently in 2011. Currently asymptomatic. Normal recent myocardial perfusion scan.  Renal artery stenosis Recent duplex ultrasonography in April 2014 did not show meaningful stenosis that could be contributing to his hypertension.  Dyslipidemia Most recent lipid profile shows excellent parameters on current medical therapy  Carotid stenosis History of previous right hemispheric stroke with residual spastic left hemiparesis.  PAD (peripheral artery disease) Unfortunately, he has very severe infrapopliteal disease and worse potential for healing with surgery. No good options for revascularization..  Meds ordered this encounter  Medications  . losartan-hydrochlorothiazide (HYZAAR) 100-12.5 MG per tablet    Sig: Take 1 tablet by mouth daily.    Refill:  3  . HYDROcodone-acetaminophen (NORCO) 7.5-325 MG per tablet    Sig: Take 1 tablet by mouth 2 (two) times daily as needed. for pain    Refill:  0  . meloxicam (MOBIC) 15 MG tablet    Sig: Take 1 tablet by mouth as needed.    Refill:  1  . simvastatin (ZOCOR) 40 MG tablet    Sig: Take 1 tablet by mouth daily.    Refill:  5  . eplerenone (INSPRA) 25 MG tablet    Sig: Take 1 tablet (25 mg total) by mouth daily.    Dispense:  30 tablet    Refill:  Rome Magda Muise, MD, [facility] [phone] office [phone] pager

## 2014-10-06 ENCOUNTER — Encounter: Payer: Self-pay | Admitting: Cardiovascular Disease

## 2014-10-06 ENCOUNTER — Ambulatory Visit (INDEPENDENT_AMBULATORY_CARE_PROVIDER_SITE_OTHER): Payer: Medicare HMO | Admitting: Cardiovascular Disease

## 2014-10-06 VITALS — BP 150/74 | HR 64 | Resp 16 | Ht 67.0 in | Wt 172.7 lb

## 2014-10-06 DIAGNOSIS — I6523 Occlusion and stenosis of bilateral carotid arteries: Secondary | ICD-10-CM

## 2014-10-06 DIAGNOSIS — I2510 Atherosclerotic heart disease of native coronary artery without angina pectoris: Secondary | ICD-10-CM

## 2014-10-06 DIAGNOSIS — I701 Atherosclerosis of renal artery: Secondary | ICD-10-CM

## 2014-10-06 DIAGNOSIS — I152 Hypertension secondary to endocrine disorders: Secondary | ICD-10-CM

## 2014-10-06 DIAGNOSIS — E785 Hyperlipidemia, unspecified: Secondary | ICD-10-CM

## 2014-10-06 DIAGNOSIS — Z79899 Other long term (current) drug therapy: Secondary | ICD-10-CM | POA: Diagnosis not present

## 2014-10-06 DIAGNOSIS — I739 Peripheral vascular disease, unspecified: Secondary | ICD-10-CM

## 2014-10-06 DIAGNOSIS — D3501 Benign neoplasm of right adrenal gland: Secondary | ICD-10-CM

## 2014-10-06 MED ORDER — EPLERENONE 25 MG PO TABS: 50.0000 mg | ORAL_TABLET | Freq: Every day | ORAL | Status: DC

## 2014-10-06 NOTE — Progress Notes (Signed)
Patient ID: Howard Jackson, male   DOB: 09/20/1945, 69 y.o.   MRN: [id number]     Cardiology Office Note   Date:  10/06/2014   ID:  Howard Jackson, DOB 07-26-1945, MRN [id number]  PCP:  Harvie Junior, MD  Cardiologist:   Sanda Klein, MD   Chief Complaint  Patient presents with  . Follow-up    4 months:  No complaints of chest pain, edema or dizziness.  Occas. SOB w/activity.      History of Present Illness: Howard Jackson is a 69 y.o. male who presents for f/u of HTN related to pheochromocytoma and has moderate renal artery stenosis. He had a normal recent PET scan and CT of the abdomen (atherosclerosis of the abdominal aorta and mesenteric arteries), without visualization of the pheo.  He has no CV complaints, but BP is too high. Denies intermittent claudication. Asks about another herbal supplement for ED (this one has a natural PDE5inhibitor)  He has PAD: subtotal right popliteal occlusion with weak monophasic PT and Per flow and occluded AT. On the left all three calf vessels are occluded, with collateral reconstitution of the PT and Per. Right ABI 0.73, left 0.77 (Doppler Sept 2014). There was no significant pelvic or femoral disease. He has moderate left renal artery stenosis (50-60% angio 2011, <60% Duplex 2014). Carotid US 11/2013 at [facility] showed no significant lesions. He has mild residual right spastic hemiparesis after a remote left hemispheric stroke.  He was reportedly diagnosed with pheochromocytoma after complications of anesthesia in [place] in 1989. An abdominal MRI in 2010 showed a 16 mm right adrenal mass consistent with pheochromocytoma. Also had a 2.4 cm right ischiorectal fossa mass, presumed to be a neurofibroma or schwannoma  Past Medical History  Diagnosis Date  . Hypertension   . Adrenal gland disorder     tumor present - no change-  no recent increase    . Stroke     in [place], L sided weakness, wears a brace on L leg & uses cane  . H/O hiatal  hernia   . Neuromuscular disorder     carpal tunnel problem, even after surgery release   . Arthritis   . Hammer toe   . Bunion     left foot  . Herniated lumbar intervertebral disc   . Coronary artery disease     cleared for surg. by Dr. Gwenlyn Found - [facility]  . Complication of anesthesia     "died on the table during cervical fusion" '89 - believed to be d/t "too much medication" to treat HTN and was later dx with pheochromocytoma  . Peripheral arterial disease   . Hyperlipidemia   . Benign pheochromocytoma of right adrenal gland 06/23/2014    Never had histological diagnosis, but reportedly initially diagnosed in 1989  . History of anxiety   . Neurogenic bladder   . Primary aldosteronism     Past Surgical History  Procedure Laterality Date  . Cervical fusion    . Coronary angioplasty with stent placement      pt reports having 5 stents in his heart-2011  . Carpal tunnel release      bilateral   . Inguinal hernia repair Left 11/18/2012    Procedure: HERNIA REPAIR INGUINAL ADULT;  Surgeon: Harl Bowie, MD;  Location: [facility];  Service: General;  Laterality: Left;  . Insertion of mesh Left 11/18/2012    Procedure: INSERTION OF MESH;  Surgeon: Harl Bowie, MD;  Location: [facility];  Service: General;  Laterality: Left;  . Hernia repair       Current Outpatient Prescriptions  Medication Sig Dispense Refill  . baclofen (LIORESAL) 10 MG tablet Take 10 mg by mouth 3 (three) times daily. Takes the baclofen after the Botox injections that occur every 90 days.    . clopidogrel (PLAVIX) 75 MG tablet Take 75 mg by mouth daily after breakfast.     . eplerenone (INSPRA) 25 MG tablet Take 2 tablets (50 mg total) by mouth daily. 60 tablet 6  . FLOVENT HFA 110 MCG/ACT inhaler Inhale 1 puff into the lungs 3 (three) times daily.   2  . fluticasone (FLONASE) 50 MCG/ACT nasal spray Place 2 sprays into both nostrils daily.  4  . HYDROcodone-acetaminophen (NORCO) 7.5-325 MG per tablet Take 1 tablet  by mouth 2 (two) times daily as needed. for pain  0  . labetalol (NORMODYNE) 300 MG tablet Take 300 mg by mouth 2 (two) times daily.     Marland Kitchen losartan-hydrochlorothiazide (HYZAAR) 100-12.5 MG per tablet Take 1 tablet by mouth daily.  3  . meloxicam (MOBIC) 15 MG tablet Take 1 tablet by mouth as needed.  1  . Methylsulfonylmethane (MSM) 1000 MG TABS Take 500 mg by mouth daily.    . Misc Natural Products (COLON CARE PO) Take 2 capsules by mouth every evening.     Marland Kitchen NIFEdipine (PROCARDIA-XL/ADALAT-CC/NIFEDICAL-XL) 30 MG 24 hr tablet Take 30 mg by mouth daily.    . OnabotulinumtoxinA (BOTOX IJ) Inject as directed. Every 90 days to fingers and left shoulder    . prazosin (MINIPRESS) 1 MG capsule Take 1 mg by mouth 3 (three) times daily.   5  . pyridOXINE (VITAMIN B-6) 100 MG tablet Take 100 mg by mouth daily.    . sennosides-docusate sodium (SENOKOT-S) 8.6-50 MG tablet Take 2 tablets by mouth every evening.     . simvastatin (ZOCOR) 40 MG tablet Take 1 tablet by mouth daily.  5  . Tamsulosin HCl (FLOMAX) 0.4 MG CAPS Take 0.4 mg by mouth daily.      Marland Kitchen tiZANidine (ZANAFLEX) 4 MG tablet Take 4-8 mg by mouth at bedtime as needed. For muscle spasms    . traMADol (ULTRAM) 50 MG tablet Take 50 mg by mouth 2 (two) times daily as needed.      No current facility-administered medications for this visit.    Allergies:   Elavil    Social History:  The patient  reports that he has never smoked. He has never used smokeless tobacco. He reports that he drinks alcohol. He reports that he does not use illicit drugs.   Family History:  The patient's family history includes Alzheimer's disease in his father; CVA in his father and paternal grandmother; Diabetes in his father; Hypertension in his brother, brother, mother, and sister.    ROS:  Please see the history of present illness.    Otherwise, review of systems positive for ED.   All other systems are reviewed and negative.    PHYSICAL EXAM: VS:  BP 150/74  mmHg  Pulse 64  Resp 16  Ht 5\' 7"  (1.702 m)  Wt 172 lb 11.2 oz (78.336 kg)  BMI 27.04 kg/m2 , BMI Body mass index is 27.04 kg/(m^2).  General: Alert, oriented x3, no distress Head: no evidence of trauma, PERRL, EOMI, no exophtalmos or lid lag, no myxedema, no xanthelasma; normal ears, nose and oropharynx Neck: normal jugular venous pulsations and no hepatojugular reflux; brisk carotid pulses without delay  and soft bilateral carotid bruits Chest: clear to auscultation, no signs of consolidation by percussion or palpation, normal fremitus, symmetrical and full respiratory excursions Cardiovascular: normal position and quality of the apical impulse, regular rhythm, normal first and second heart sounds, no murmurs, rubs or gallops Abdomen: no tenderness or distention, no masses by palpation, no abnormal pulsatility or arterial bruits, normal bowel sounds, no hepatosplenomegaly Extremities: no clubbing, cyanosis or edema; 2+ radial, ulnar and brachial pulses bilaterally; 2+ right femoral, posterior tibial and absent dorsalis pedis pulses; 2+ left femoral, posterior tibial and absent dorsalis pedis pulses; no subclavian or femoral bruits Neurological: grossly nonfocal Psych: euthymic mood, full affect    Lipid Panel    Component Value Date/Time   CHOL  10/21/2009 0340    120        ATP III CLASSIFICATION:  <200     mg/dL   Desirable  200-239  mg/dL   Borderline High  >=240    mg/dL   High          TRIG 55 10/21/2009 0340   HDL 46 10/21/2009 0340   CHOLHDL 2.6 10/21/2009 0340   VLDL 11 10/21/2009 0340   LDLCALC  10/21/2009 0340    63        Total Cholesterol/HDL:CHD Risk Coronary Heart Disease Risk Table                     Men   Women  1/2 Average Risk   3.4   3.3  Average Risk       5.0   4.4  2 X Average Risk   9.6   7.1  3 X Average Risk  23.4   11.0        Use the calculated Patient Ratio above and the CHD Risk Table to determine the patient's CHD Risk.        ATP III  CLASSIFICATION (LDL):  <100     mg/dL   Optimal  100-129  mg/dL   Near or Above                    Optimal  130-159  mg/dL   Borderline  160-189  mg/dL   High  >190     mg/dL   Very High      Wt Readings from Last 3 Encounters:  10/06/14 172 lb 11.2 oz (78.336 kg)  06/21/14 172 lb 1.6 oz (78.064 kg)  05/16/14 171 lb (77.565 kg)      Other studies Reviewed: Additional studies/ records that were reviewed today include: CT and PET reports  ASSESSMENT AND PLAN:  Howard Jackson has elevated BP despite multiple medications. He was diagnosed with a benign pheochromocytoma of the right adrenal years ago. The CT scan and PET scan that were performed amy have not been the best tests for confirmation of the diagnosis (MRI or MIBG scan would have been better). He has an endocrinology follow up scheduled and I would recommend that his endocrinologist coordinate the evaluation from now on. BP is high. Recommend increasing the dose of eplerenone to 50 mg daily.  Current medicines are reviewed at length with the patient today.  The patient does not have concerns regarding medicines.  The following changes have been made:  Clarified some changes in his medication list which still had cozaar in addition to hyzaar and still had spironolactone in addition to eplerenone. Increased eplerenone.dose to 50 mg daily  Labs/ tests ordered today include:  Orders  Placed This Encounter  Procedures  . Basic metabolic panel   Patient Instructions  STOP the plain Losartan and Spironolactone.  INCREASE Inspra to 50mg  daily.  Your physician recommends that you return for lab work in: 3 weeks.  Dr. Sallyanne Kuster recommends that you schedule a follow-up appointment in: 6 months.       Mikael Spray, MD  10/06/2014 6:01 PM    Sanda Klein, MD, [facility] [phone] office [phone] pager

## 2014-10-06 NOTE — Patient Instructions (Signed)
STOP the plain Losartan and Spironolactone.  INCREASE Inspra to 50mg  daily.  Your physician recommends that you return for lab work in: 3 weeks.  Dr. Sallyanne Kuster recommends that you schedule a follow-up appointment in: 6 months.

## 2014-10-25 ENCOUNTER — Telehealth: Payer: Self-pay | Admitting: Cardiovascular Disease

## 2014-10-25 NOTE — Telephone Encounter (Signed)
Returned call to patient he stated he cannot take Inspra.Stated when he took 25 mg he was having a productive cough.Stated since increased to 50 mg cough is worse, coughing white phlegm and a lot of nasal congestion.Stated he wants to stop and restart Aldactone.Message sent to Dr.Croitoru for advice.

## 2014-10-25 NOTE — Telephone Encounter (Signed)
Please call,having side effects from his Ismpra.

## 2014-10-26 NOTE — Telephone Encounter (Signed)
Fine to go back to spironolactone

## 2014-10-26 NOTE — Telephone Encounter (Signed)
Returned call to patient Dr.Croitoru advised ok to stop Inspra and start back Spironolactone.

## 2014-10-27 LAB — BASIC METABOLIC PANEL
BUN: 14 mg/dL (ref 6–23)
CALCIUM: 9.2 mg/dL (ref 8.4–10.5)
CO2: 25 mEq/L (ref 19–32)
Chloride: 100 mEq/L (ref 96–112)
Creat: 1.04 mg/dL (ref 0.50–1.35)
Glucose, Bld: 103 mg/dL — ABNORMAL HIGH (ref 70–99)
POTASSIUM: 4.2 meq/L (ref 3.5–5.3)
Sodium: 135 mEq/L (ref 135–145)

## 2015-04-18 ENCOUNTER — Encounter: Payer: Self-pay | Admitting: Physician Assistant

## 2015-04-18 ENCOUNTER — Ambulatory Visit (INDEPENDENT_AMBULATORY_CARE_PROVIDER_SITE_OTHER): Payer: Medicare HMO | Admitting: Physician Assistant

## 2015-04-18 VITALS — BP 152/90 | HR 64 | Ht 66.75 in | Wt 171.3 lb

## 2015-04-18 DIAGNOSIS — D3501 Benign neoplasm of right adrenal gland: Secondary | ICD-10-CM

## 2015-04-18 DIAGNOSIS — R8299 Other abnormal findings in urine: Secondary | ICD-10-CM | POA: Diagnosis not present

## 2015-04-18 DIAGNOSIS — E785 Hyperlipidemia, unspecified: Secondary | ICD-10-CM

## 2015-04-18 DIAGNOSIS — I10 Essential (primary) hypertension: Secondary | ICD-10-CM

## 2015-04-18 DIAGNOSIS — I2510 Atherosclerotic heart disease of native coronary artery without angina pectoris: Secondary | ICD-10-CM

## 2015-04-18 DIAGNOSIS — R109 Unspecified abdominal pain: Secondary | ICD-10-CM

## 2015-04-18 DIAGNOSIS — I2583 Coronary atherosclerosis due to lipid rich plaque: Secondary | ICD-10-CM

## 2015-04-18 DIAGNOSIS — I701 Atherosclerosis of renal artery: Secondary | ICD-10-CM

## 2015-04-18 DIAGNOSIS — R82998 Other abnormal findings in urine: Secondary | ICD-10-CM

## 2015-04-18 DIAGNOSIS — I6529 Occlusion and stenosis of unspecified carotid artery: Secondary | ICD-10-CM

## 2015-04-18 NOTE — Patient Instructions (Signed)
Lab work ( cbc,u/a ) today  Your physician wants you to follow-up in: 09/2015 with Dr.Croitoru. You will receive a reminder letter in the mail two months in advance. If you don't receive a letter, please call our office to schedule the follow-up appointment.

## 2015-04-18 NOTE — Progress Notes (Signed)
Patient ID: Howard Jackson, male   DOB: [birth year], 69 y.o.   MRN: [id number]    Date:  04/18/2015   ID:  Howard Jackson, DOB 07/08/1946, MRN [id number]  PCP:  Harvie Junior, MD  Primary Cardiologist:  Croitoru  Chief Complaint  Patient presents with  . Follow-up    Occas. wakes up feeling SOB.  Having left shoulder pain related to problems with his arm. Would like to have a renal doppler done to make sure he does not have any blockages. Also, would llike testing done to make sure his cardiac stents are still open.  Swelling of ankles left greater than right.     History of Present Illness: Howard Jackson is a 69 y.o. male with a history of HTN related to pheochromocytoma and has moderate renal artery stenosis. He had a normal recent PET scan and CT of the abdomen (atherosclerosis of the abdominal aorta and mesenteric arteries), without visualization of the pheo.  He has PAD: subtotal right popliteal occlusion with weak monophasic PT and Per flow and occluded AT. On the left all three calf vessels are occluded, with collateral reconstitution of the PT and Per. Right ABI 0.73, left 0.77 (Doppler Sept 2014). There was no significant pelvic or femoral disease. He has moderate left renal artery stenosis (50-60% angio 2011, <60% Duplex 2014). Carotid US 11/2013 at [facility] showed no significant lesions. He has mild residual right spastic hemiparesis after a remote left hemispheric stroke.  He was reportedly diagnosed with pheochromocytoma after complications of anesthesia in [place] in 1989. An abdominal MRI in 2010 showed a 16 mm right adrenal mass consistent with pheochromocytoma. Also had a 2.4 cm right ischiorectal fossa mass, presumed to be a neurofibroma or schwannoma  He had labs collected in August CBC was normal. His creatinine was 1.34 cholesterol looked excellent with triglycerides of 82 LDL of 69 HDL 43. His TSH was normal. Hemoglobin A1c was 5.9. PSA was 2.72 24 hour catecholamine  urine was normal as was the 24-hour creatinine and normetanephrine of 532 is just above normal  She presents today for complaints forming urine and left flank pain. He also complains of itching all over. For about a week when he wakes up in the morning he feels short of breath some shaking chills. He has not taken his temperature. This seems to resolve during the day. He's been checking his blood pressures and they've been well controlled more so yesterday and today compared to 3 days ago.  He feels his had a little bit of phlegm production.  He has some chronic left ankle edema. The patient currently denies nausea, vomiting, fever, chest pain, orthopnea, dizziness, PND, cough, abdominal pain, hematochezia, melena, claudication.  Wt Readings from Last 3 Encounters:  04/18/15 77.701 kg (171 lb 4.8 oz)  10/06/14 78.336 kg (172 lb 11.2 oz)  06/21/14 78.064 kg (172 lb 1.6 oz)     Past Medical History  Diagnosis Date  . Hypertension   . Adrenal gland disorder ([place])     tumor present - no change-  no recent increase    . Stroke ([place])     in [place], L sided weakness, wears a brace on L leg & uses cane  . H/O hiatal hernia   . Neuromuscular disorder ([place])     carpal tunnel problem, even after surgery release   . Arthritis   . Hammer toe   . Bunion     left foot  . Herniated lumbar  intervertebral disc   . Coronary artery disease     cleared for surg. by Dr. Gwenlyn Found - [facility]  . Complication of anesthesia     "died on the table during cervical fusion" '89 - believed to be d/t "too much medication" to treat HTN and was later dx with pheochromocytoma  . Peripheral arterial disease ([place])   . Hyperlipidemia   . Benign pheochromocytoma of right adrenal gland 06/23/2014    Never had histological diagnosis, but reportedly initially diagnosed in 1989  . History of anxiety   . Neurogenic bladder   . Primary aldosteronism [facility])     Current Outpatient Prescriptions  Medication Sig Dispense Refill  .  baclofen (LIORESAL) 10 MG tablet Take 10 mg by mouth 3 (three) times daily. Takes the baclofen after the Botox injections that occur every 90 days.    . clopidogrel (PLAVIX) 75 MG tablet Take 75 mg by mouth daily after breakfast.     . FLOVENT HFA 110 MCG/ACT inhaler Inhale 1 puff into the lungs 3 (three) times daily.   2  . fluticasone (FLONASE) 50 MCG/ACT nasal spray Place 2 sprays into both nostrils daily.  4  . HYDROcodone-acetaminophen (NORCO) 7.5-325 MG per tablet Take 1 tablet by mouth 2 (two) times daily as needed. for pain  0  . labetalol (NORMODYNE) 300 MG tablet Take 300 mg by mouth 2 (two) times daily.     Marland Kitchen losartan-hydrochlorothiazide (HYZAAR) 100-12.5 MG per tablet Take 1 tablet by mouth daily.  3  . meloxicam (MOBIC) 15 MG tablet Take 1 tablet by mouth as needed.  1  . Methylsulfonylmethane (MSM) 1000 MG TABS Take 500 mg by mouth daily.    . Misc Natural Products (COLON CARE PO) Take 2 capsules by mouth every evening.     Marland Kitchen NIFEdipine (PROCARDIA-XL/ADALAT-CC/NIFEDICAL-XL) 30 MG 24 hr tablet Take 30 mg by mouth daily.    . OnabotulinumtoxinA (BOTOX IJ) Inject as directed. Every 90 days to fingers and left shoulder    . prazosin (MINIPRESS) 1 MG capsule Take 1 mg by mouth 3 (three) times daily.   5  . pyridOXINE (VITAMIN B-6) 100 MG tablet Take 100 mg by mouth daily.    . sennosides-docusate sodium (SENOKOT-S) 8.6-50 MG tablet Take 2 tablets by mouth every evening.     . simvastatin (ZOCOR) 40 MG tablet Take 1 tablet by mouth daily.  5  . spironolactone (ALDACTONE) 50 MG tablet Take 1 tablet (50 mg total) by mouth daily. 30 tablet 6  . Tamsulosin HCl (FLOMAX) 0.4 MG CAPS Take 0.4 mg by mouth daily.      Marland Kitchen tiZANidine (ZANAFLEX) 4 MG tablet Take 4-8 mg by mouth at bedtime as needed. For muscle spasms    . triamcinolone cream (KENALOG) 0.1 % 2 (two) times daily.  2  . VENTOLIN HFA 108 (90 BASE) MCG/ACT inhaler as needed.  5   No current facility-administered medications for this  visit.    Allergies:    Allergies  Allergen Reactions  . Elavil [Amitriptyline Hcl] Hypertension    Social History:  The patient  reports that he has never smoked. He has never used smokeless tobacco. He reports that he drinks alcohol. He reports that he does not use illicit drugs.   Family history:   Family History  Problem Relation Age of Onset  . Hypertension Mother   . CVA Father   . Diabetes Father   . Alzheimer's disease Father   . Hypertension Sister   .  Hypertension Brother   . CVA Paternal Grandmother   . Hypertension Brother     ROS:  Please see the history of present illness.  All other systems reviewed and negative.   PHYSICAL EXAM: VS:  BP 152/90 mmHg  Pulse 64  Ht 5' 6.75" (1.695 m)  Wt 77.701 kg (171 lb 4.8 oz)  BMI 27.05 kg/m2 Well nourished, well developed, in no acute distress HEENT: Pupils are equal round react to light accommodation extraocular movements are intact.  Neck: no JVDNo cervical lymphadenopathy. Cardiac: Regular rate and rhythm without murmurs rubs or gallops. Lungs:  clear to auscultation bilaterally, no wheezing, rhonchi or rales Abd: soft, nontender, positive bowel sounds all quadrants, no hepatosplenomegaly  Left flank tender to percussion and mild palpation Ext: 1+ left ankle edema lower extremity edema.  2+ radial and 0 dorsalis pedis pulses. Skin: warm and dry Neuro:  Grossly normal  EKG:  Sinus rhythm with PACs     ASSESSMENT AND PLAN:  Problem List Items Addressed This Visit    Renal artery stenosis (HCC)   Left flank pain   HTN (hypertension)   Dyslipidemia   Carotid stenosis   CAD (coronary artery disease)   Benign pheochromocytoma of right adrenal gland    Other Visit Diagnoses    Atherosclerosis of native coronary artery of native heart without angina pectoris    -  Primary    Relevant Orders    EKG 12-Lead    CBC with Differential    Urinalysis    Foamy urine        Relevant Orders    EKG 12-Lead    CBC  with Differential    Urinalysis       Left flank pain This really seems musculoskeletal. Light palpation and exacerbated. He does describe foaming urine which it could indicate excessive proteins. Will check a UA. He's also had some shaking chills and short of breath in the morning. Will check CBC to see if his white count is elevated.  Recommended he follow-up with his primary care physician.  Hypertension Blood pressures a little bit elevated here in the office however yesterday and the day before his blood pressures. We'll controlled. Patient brought in a diary.  No changes.  24-hour urine for metanephrines and chemical means was essentially normal and only elevated lab was the metanephrines 532 which was just above the normal range.  His endocrinologist didn't recommend a follow-up.  Dyslipidemia Lipid panel looks excellent. Continue statin.  He also has made a lot of natural dietary's changes probably making a big difference  Coronary artery disease No complaints of angina.

## 2015-04-19 LAB — CBC WITH DIFFERENTIAL/PLATELET
BASOS ABS: 0 10*3/uL (ref 0.0–0.1)
Basophils Relative: 0 % (ref 0–1)
Eosinophils Absolute: 0 10*3/uL (ref 0.0–0.7)
Eosinophils Relative: 1 % (ref 0–5)
HCT: 40.1 % (ref 39.0–52.0)
HEMOGLOBIN: 14 g/dL (ref 13.0–17.0)
LYMPHS ABS: 1.5 10*3/uL (ref 0.7–4.0)
LYMPHS PCT: 35 % (ref 12–46)
MCH: 33.4 pg (ref 26.0–34.0)
MCHC: 34.9 g/dL (ref 30.0–36.0)
MCV: 95.7 fL (ref 78.0–100.0)
MPV: 10.7 fL (ref 8.6–12.4)
Monocytes Absolute: 0.6 10*3/uL (ref 0.1–1.0)
Monocytes Relative: 14 % — ABNORMAL HIGH (ref 3–12)
Neutro Abs: 2.2 10*3/uL (ref 1.7–7.7)
Neutrophils Relative %: 50 % (ref 43–77)
Platelets: 202 10*3/uL (ref 150–400)
RBC: 4.19 MIL/uL — AB (ref 4.22–5.81)
RDW: 13.4 % (ref 11.5–15.5)
WBC: 4.3 10*3/uL (ref 4.0–10.5)

## 2015-04-19 LAB — URINALYSIS
Bilirubin Urine: NEGATIVE
Glucose, UA: NEGATIVE
HGB URINE DIPSTICK: NEGATIVE
Ketones, ur: NEGATIVE
LEUKOCYTES UA: NEGATIVE
NITRITE: NEGATIVE
Protein, ur: NEGATIVE
Specific Gravity, Urine: 1.018 (ref 1.001–1.035)
pH: 5 (ref 5.0–8.0)

## 2015-04-23 ENCOUNTER — Telehealth: Payer: Self-pay | Admitting: Cardiovascular Disease

## 2015-04-23 NOTE — Telephone Encounter (Signed)
Patient is returning a call from [name], Therapist, sports.  Please call him after lunch--he is on his way to see his PCP.

## 2015-04-23 NOTE — Telephone Encounter (Signed)
LEFT MESSAGE TO CALL BACK- IN REGARDS MEDICATION CHANGE

## 2015-04-23 NOTE — Telephone Encounter (Signed)
SPOKE TO PHARMACIST- Sarah She states patient said -he should only be taking Losartan without hctz. She states there is no prescription for this change.  she is calling to clarify.  RN informed Sarah WILL HAVE TO INVESTIGATE AND CONTACT HER WITH THE CHANGES

## 2015-04-24 NOTE — Telephone Encounter (Signed)
Returned call to patient. His situation is a little confusing in that on his med list with out practice is losartan hctz 100-12.5mg  QD and this has been on his list since 06/2014. He reports he talked to Dr. Loletha Grayer about NOT taking hydrochlorothiazide in the past but it is still on his list. It appears his PCP refills this medication as we have no record of refilling this on file. He reports he brought this medication concern up to his PCP yesterday but when he went to pick up the Rx it was for Hyzaar. Patient would like clarification about if he should be on losartan OR losartan-hct as he is already on spironolactone.   Last OV with MD 10/06/2014 : Patient Instructions  STOP the plain Losartan and Spironolactone.  INCREASE Inspra to 50mg  daily.  Your physician recommends that you return for lab work in: 3 weeks.  Dr. Sallyanne Kuster recommends that you schedule a follow-up appointment in: 6 months       No med changes were made at [place] with B. Samara Snide, [place] on 04/18/15   Informed patient that message will be sent to MD to review and advise and he and his pharmacy will be notified of MD advice.

## 2015-04-25 NOTE — Telephone Encounter (Signed)
Spoke with patient. Explained that Dr. Loletha Grayer prefers patient to take hyzaar and explained rationale. Patient began talking about ED and hctz and his thyroid levels - had blood work at PCP. He noted something about an ingrown nail and antibiotics increasing his creatinine. I explained regardless of these issues, Dr. Loletha Grayer prefers him to take hyzaar - he reports he picked up just losartan from pharmacy yesterday. Was going to offer BP check appointment, but realized patient has OV with Dr. Loletha Grayer on 10/20. Patient will keep this office visit and was advised to take hyzaar until notified otherwise.

## 2015-04-25 NOTE — Telephone Encounter (Signed)
This continues to be confusing to the patient and to me. At his last appointment with me he was taking BOTH Hyzaar and Cozaar. I asked him to stop the plain losartan (Cozaar). We did not discuss coming off HCTZ. I think he needs the HCTZ for good BP control and to balance out the K levels since he takes an aldosterone antagonist (spironolactone) which raises K. He would benefit from a visit with Erasmo Downer and should have a BMET before that visit.

## 2015-05-03 ENCOUNTER — Ambulatory Visit (INDEPENDENT_AMBULATORY_CARE_PROVIDER_SITE_OTHER): Payer: Medicare HMO | Admitting: Cardiovascular Disease

## 2015-05-03 ENCOUNTER — Encounter: Payer: Self-pay | Admitting: Cardiovascular Disease

## 2015-05-03 VITALS — BP 196/92 | HR 88 | Ht 67.0 in | Wt 168.0 lb

## 2015-05-03 DIAGNOSIS — I6529 Occlusion and stenosis of unspecified carotid artery: Secondary | ICD-10-CM | POA: Diagnosis not present

## 2015-05-03 DIAGNOSIS — I701 Atherosclerosis of renal artery: Secondary | ICD-10-CM

## 2015-05-03 DIAGNOSIS — I739 Peripheral vascular disease, unspecified: Secondary | ICD-10-CM | POA: Diagnosis not present

## 2015-05-03 DIAGNOSIS — I2510 Atherosclerotic heart disease of native coronary artery without angina pectoris: Secondary | ICD-10-CM | POA: Diagnosis not present

## 2015-05-03 DIAGNOSIS — I2583 Coronary atherosclerosis due to lipid rich plaque: Secondary | ICD-10-CM

## 2015-05-03 NOTE — Patient Instructions (Signed)
Your physician has recommended you make the following change in your medication: INCREASE NIFEDIPINE 30MG  TO TWICE A DAY. STOP MINIPRESS.  Your physician has requested that you have a lower extremity arterial exercise duplex. During this test, exercise and ultrasound are used to evaluate arterial blood flow in the legs. Allow one hour for this exam. There are no restrictions or special instructions.  Your physician recommends that you schedule a follow-up appointment in: [place], [facility].  Dr. Sallyanne Kuster recommends that you schedule a follow-up appointment in: 4 MONTHS.

## 2015-05-04 ENCOUNTER — Other Ambulatory Visit: Payer: Self-pay | Admitting: Cardiovascular Disease

## 2015-05-04 DIAGNOSIS — I739 Peripheral vascular disease, unspecified: Secondary | ICD-10-CM

## 2015-05-04 MED ORDER — LOSARTAN POTASSIUM-HCTZ 100-12.5 MG PO TABS: 1.0000 | ORAL_TABLET | Freq: Every day | ORAL | Status: DC

## 2015-05-04 NOTE — Telephone Encounter (Signed)
°  STAT if patient is at the pharmacy , call can be transferred to refill team.   1. Which medications need to be refilled? Losartan Potassium   2. Which pharmacy/location is medication to be sent to?[facility] on [address]   3. Do they need a 30 day or 90 day supply? [place]

## 2015-05-04 NOTE — Telephone Encounter (Signed)
Rx(s) sent to pharmacy electronically.  

## 2015-05-05 NOTE — Progress Notes (Signed)
Patient ID: Howard Jackson, male   DOB: 1946-02-24, 69 y.o.   MRN: [id number]      Cardiology Office Note   Date:  05/05/2015   ID:  Howard Jackson, DOB 08-20-1945, MRN [id number]  PCP:  Howard Junior, MD  Cardiologist:   Howard Klein, MD   Chief Complaint  Patient presents with  . 3 weeks      History of Present Illness: Howard Jackson is a 69 y.o. male who presents for  Follow-up for severe systemic hypertension    he describes broad oscillations in his blood pressure that have a diurnal pattern. In the morning his systolic blood pressure is barely over 100 mmHg and the diastolic blood pressure is 160. In the evenings his blood pressures typically in the 170s-190s. Today in the office his blood pressure is very high at 196/92.  His blood pressure is equal in the right and left upper extremity. After resting for 20 minutes his blood pressure was still high at 172-89 mmHg.His home monitor appears to overestimate his blood pressure by about 20 points.  He bears adiagnosis of pheochromocytoma  Initially identified in 1989. 2010 abdominal MRI shows a 6 mm right adrenal mass consistent with pheochromocytoma.  He has a history of extensive PAD and has had coronary artery disease:  - subtotal right popliteal occlusion with weak monophasic PT and Per flow and occluded AT. On the left all three calf vessels are occluded, with collateral reconstitution of the PT and Per. Right ABI 0.73, left 0.77 (Doppler Sept 2014). There was no significant pelvic or femoral disease.  - He has moderate left renal artery stenosis (50-60% angio 2011, <60% Duplex 2014).  - Carotid [facility] 11/2013 at [facility] showed no significant lesions. He has mild residual right spastic hemiparesis after a remote left hemispheric stroke. -  He has had 5 coronary angioplasty/stent   Procedures to the RCA and LAD artery, most recently in April 2011 for LAD in-stent restenosis ( when he received another drug-eluting stent )  As  before, he is very much troubled by erectile dysfunction and asks whether his peripheral arterial disease could be responsible for this. He has read about the delivery she syndrome. I told him that his previous vascular evaluation shows no major stenoses in the abdominal aorta or iliac arteries. He is also worried that the hydrochlorothiazide is interfering with erectile function. He wants to stop this medication at all cost.  In the past he had very prominent symptoms of orthostatic hypotension while taking clonidine. He was intolerant to treatment with PDE 5 inhibitors due to nasal congestion and dyspepsia.   Past Medical History  Diagnosis Date  . Hypertension   . Adrenal gland disorder ([place])     tumor present - no change-  no recent increase    . Stroke ([place])     in [place], L sided weakness, wears a brace on L leg & uses cane  . H/O hiatal hernia   . Neuromuscular disorder ([place])     carpal tunnel problem, even after surgery release   . Arthritis   . Hammer toe   . Bunion     left foot  . Herniated lumbar intervertebral disc   . Coronary artery disease     cleared for surg. by Dr. Gwenlyn Jackson - [facility]  . Complication of anesthesia     "died on the table during cervical fusion" '89 - believed to be d/t "too much medication" to treat HTN and was later  dx with pheochromocytoma  . Peripheral arterial disease ([place])   . Hyperlipidemia   . Benign pheochromocytoma of right adrenal gland 06/23/2014    Never had histological diagnosis, but reportedly initially diagnosed in 1989  . History of anxiety   . Neurogenic bladder   . Primary aldosteronism [facility])     Past Surgical History  Procedure Laterality Date  . Cervical fusion    . Coronary angioplasty with stent placement      pt reports having 5 stents in his heart-2011  . Carpal tunnel release      bilateral   . Inguinal hernia repair Left 11/18/2012    Procedure: HERNIA REPAIR INGUINAL ADULT;  Surgeon: Howard Bowie, MD;  Location: [place];  Service: General;  Laterality: Left;  . Insertion of mesh Left 11/18/2012    Procedure: INSERTION OF MESH;  Surgeon: Howard Bowie, MD;  Location: [place];  Service: General;  Laterality: Left;  . Hernia repair       Current Outpatient Prescriptions  Medication Sig Dispense Refill  . baclofen (LIORESAL) 10 MG tablet Take 10 mg by mouth 3 (three) times daily. Takes the baclofen after the Botox injections that occur every 90 days.    . clopidogrel (PLAVIX) 75 MG tablet Take 75 mg by mouth daily after breakfast.     . FLOVENT HFA 110 MCG/ACT inhaler Inhale 1 puff into the lungs 3 (three) times daily.   2  . fluticasone (FLONASE) 50 MCG/ACT nasal spray Place 2 sprays into both nostrils daily.  4  . labetalol (NORMODYNE) 300 MG tablet Take 300 mg by mouth 2 (two) times daily.     . meloxicam (MOBIC) 15 MG tablet Take 1 tablet by mouth as needed.  1  . Methylsulfonylmethane (MSM) 1000 MG TABS Take 500 mg by mouth daily.    . Misc Natural Products (COLON CARE PO) Take 2 capsules by mouth every evening.     Marland Kitchen NIFEdipine (PROCARDIA-XL/ADALAT-CC/NIFEDICAL-XL) 30 MG 24 hr tablet Take 30 mg by mouth 2 (two) times daily.    . OnabotulinumtoxinA (BOTOX IJ) Inject as directed. Every 90 days to fingers and left shoulder    . pyridOXINE (VITAMIN B-6) 100 MG tablet Take 100 mg by mouth daily.    . sennosides-docusate sodium (SENOKOT-S) 8.6-50 MG tablet Take 2 tablets by mouth every evening.     . simvastatin (ZOCOR) 40 MG tablet Take 1 tablet by mouth daily.  5  . spironolactone (ALDACTONE) 50 MG tablet Take 1 tablet (50 mg total) by mouth daily. 30 tablet 6  . Tamsulosin HCl (FLOMAX) 0.4 MG CAPS Take 0.4 mg by mouth daily.      Marland Kitchen tiZANidine (ZANAFLEX) 4 MG tablet Take 4-8 mg by mouth at bedtime as needed. For muscle spasms    . triamcinolone cream (KENALOG) 0.1 % 2 (two) times daily.  2  . VENTOLIN HFA 108 (90 BASE) MCG/ACT inhaler as needed.  5  . losartan-hydrochlorothiazide (HYZAAR) 100-12.5 MG  tablet Take 1 tablet by mouth daily. 90 tablet 3   No current facility-administered medications for this visit.    Allergies:   Elavil    Social History:  The patient  reports that he has never smoked. He has never used smokeless tobacco. He reports that he drinks alcohol. He reports that he does not use illicit drugs.   Family History:  The patient's family history includes Alzheimer's disease in his father; CVA in his father and paternal grandmother; Diabetes in his father;  Hypertension in his brother, brother, mother, and sister.    ROS:  Please see the history of present illness.    Otherwise, review of systems positive for  Persistent spastic right hemiparesis.   All other systems are reviewed and negative.    PHYSICAL EXAM: VS:  BP 196/92 mmHg  Pulse 88  Ht 5\' 7"  (1.702 m)  Wt 168 lb (76.204 kg)  BMI 26.31 kg/m2 , BMI Body mass index is 26.31 kg/(m^2).  General: Alert, oriented x3, no distress Head: no evidence of trauma, PERRL, EOMI, no exophtalmos or lid lag, no myxedema, no xanthelasma; normal ears, nose and oropharynx Neck: normal jugular venous pulsations and no hepatojugular reflux; brisk carotid pulses without delay and no carotid bruits Chest: clear to auscultation, no signs of consolidation by percussion or palpation, normal fremitus, symmetrical and full respiratory excursions Cardiovascular: normal position and quality of the apical impulse, regular rhythm, normal first and second heart sounds, no  murmurs, rubs or gallops Abdomen: no tenderness or distention, no masses by palpation, no abnormal pulsatility or arterial bruits, normal bowel sounds, no hepatosplenomegaly Extremities: no clubbing, cyanosis or edema; 2+ radial, ulnar and brachial pulses bilaterally; 2+ right femoral, posterior tibial and dorsalis pedis pulses; 2+ left femoral, posterior tibial and dorsalis pedis pulses; no subclavian or femoral bruits Neurological:  Spastic right Hemiparesis Psych:  euthymic mood, full affect   EKG:  EKG is not ordered today.   Recent Labs: 10/26/2014: BUN 14; Creat 1.04; Potassium 4.2; Sodium 135 04/18/2015: Hemoglobin 14.0; Platelets 202    Lipid Panel    Component Value Date/Time   CHOL  10/21/2009 0340    120        ATP III CLASSIFICATION:  <200     mg/dL   Desirable  200-239  mg/dL   Borderline High  >=240    mg/dL   High          TRIG 55 10/21/2009 0340   HDL 46 10/21/2009 0340   CHOLHDL 2.6 10/21/2009 0340   VLDL 11 10/21/2009 0340   LDLCALC  10/21/2009 0340    63        Total Cholesterol/HDL:CHD Risk Coronary Heart Disease Risk Table                     Men   Women  1/2 Average Risk   3.4   3.3  Average Risk       5.0   4.4  2 X Average Risk   9.6   7.1  3 X Average Risk  23.4   11.0        Use the calculated Patient Ratio above and the CHD Risk Table to determine the patient's CHD Risk.        ATP III CLASSIFICATION (LDL):  <100     mg/dL   Optimal  100-129  mg/dL   Near or Above                    Optimal  130-159  mg/dL   Borderline  160-189  mg/dL   High  >190     mg/dL   Very High      Wt Readings from Last 3 Encounters:  05/03/15 168 lb (76.204 kg)  04/18/15 171 lb 4.8 oz (77.701 kg)  10/06/14 172 lb 11.2 oz (78.336 kg)      ASSESSMENT AND PLAN:  1.  Systemic hypertension, poorly controlled. I checked his home monitor today. It seems to  overestimate his blood pressure by about 20 points. Despite this he reports that at home his blood pressure sometimes as low as 101-106 /60 millimeters Hg, especially in the mornings.  Will increase his long-acting calcium channel blocker to twice daily. Have asked him to stop taking prazosin which I think is responsible for his very low blood pressure in the mornings , due to orthostatic hypotension. I have told him that I understand his concerns about the use of hydrochlorothiazide, but that I am loath to discontinue this medication until we have achieved consistent blood  pressure control.  I'm also skeptical that stopping the hydrochlorothiazide will have a massive beneficial impact on his erectile dysfunction. Previous vascular evaluation did not show iliac aortic disease , but rather infrapopliteal disease. Will repeat today Doppler ultrasound to make sure that he has not developed interim internal iliac obstruction that could explain his erection problems.    Current medicines are reviewed at length with the patient today.  The patient has concerns regarding medicines.  The following changes have been made:   Stop prazosin. Increase nifedipine sustained release to 30 mg twice a day  Labs/ tests ordered today include:  No orders of the defined types were placed in this encounter.     Patient Instructions  Your physician has recommended you make the following change in your medication: INCREASE NIFEDIPINE 30MG  TO TWICE A DAY. STOP MINIPRESS.  Your physician has requested that you have a lower extremity arterial exercise duplex. During this test, exercise and ultrasound are used to evaluate arterial blood flow in the legs. Allow one hour for this exam. There are no restrictions or special instructions.  Your physician recommends that you schedule a follow-up appointment in: [place], PHARMD.  Dr. Sallyanne Kuster recommends that you schedule a follow-up appointment in: 4 MONTHS.         Mikael Spray, MD  05/05/2015 10:49 PM    Howard Klein, MD, [facility] [phone] office [phone] pager

## 2015-05-09 ENCOUNTER — Ambulatory Visit (HOSPITAL_COMMUNITY)
Admission: RE | Admit: 2015-05-09 | Discharge: 2015-05-09 | Disposition: A | Discharge status: Home / Self Care | Payer: Medicare HMO | Source: Ambulatory Visit | Attending: Cardiology | Admitting: Cardiology

## 2015-05-09 DIAGNOSIS — I739 Peripheral vascular disease, unspecified: Secondary | ICD-10-CM | POA: Diagnosis not present

## 2015-05-18 ENCOUNTER — Ambulatory Visit (HOSPITAL_COMMUNITY)
Admission: RE | Admit: 2015-05-18 | Discharge: 2015-05-18 | Disposition: A | Discharge status: Home / Self Care | Payer: Medicare HMO | Source: Ambulatory Visit | Attending: Cardiovascular Disease | Admitting: Cardiovascular Disease

## 2015-05-18 DIAGNOSIS — I10 Essential (primary) hypertension: Secondary | ICD-10-CM | POA: Diagnosis not present

## 2015-05-18 DIAGNOSIS — E785 Hyperlipidemia, unspecified: Secondary | ICD-10-CM | POA: Diagnosis not present

## 2015-05-18 DIAGNOSIS — I771 Stricture of artery: Secondary | ICD-10-CM | POA: Diagnosis not present

## 2015-05-18 DIAGNOSIS — I739 Peripheral vascular disease, unspecified: Secondary | ICD-10-CM

## 2015-05-24 ENCOUNTER — Ambulatory Visit (INDEPENDENT_AMBULATORY_CARE_PROVIDER_SITE_OTHER): Payer: Medicare HMO | Admitting: Pharmacist Clinician (PhC)/ Clinical Pharmacy Specialist

## 2015-05-24 VITALS — BP 170/76 | HR 72 | Ht 67.0 in | Wt 172.5 lb

## 2015-05-24 DIAGNOSIS — I10 Essential (primary) hypertension: Secondary | ICD-10-CM | POA: Diagnosis not present

## 2015-05-24 NOTE — Patient Instructions (Signed)
Call Erasmo Downer if you notice your blood pressure is > 150/90 on 3 or more readings  Your blood pressure today is 170/76  (goal is < 150/90)  Check your blood pressure at home once daily and keep record of the readings.  Take your BP meds as follows: no changes in medications today  Bring all of your meds, your BP cuff and your record of home blood pressures to your next appointment.  Exercise as you're able, try to walk approximately 30 minutes per day.  Keep salt intake to a minimum, especially watch canned and prepared boxed foods.  Eat more fresh fruits and vegetables and fewer canned items.  Avoid eating in fast food restaurants.    HOW TO TAKE YOUR BLOOD PRESSURE: . Rest 5 minutes before taking your blood pressure. .  Don't smoke or drink caffeinated beverages for at least 30 minutes before. . Take your blood pressure before (not after) you eat. . Sit comfortably with your back supported and both feet on the floor (don't cross your legs). . Elevate your arm to heart level on a table or a desk. . Use the proper sized cuff. It should fit smoothly and snugly around your bare upper arm. There should be enough room to slip a fingertip under the cuff. The bottom edge of the cuff should be 1 inch above the crease of the elbow. . Ideally, take 3 measurements at one sitting and record the average.

## 2015-05-25 ENCOUNTER — Encounter: Payer: Self-pay | Admitting: Pharmacist Clinician (PhC)/ Clinical Pharmacy Specialist

## 2015-05-25 NOTE — Progress Notes (Signed)
05/25/2015 Howard Jackson [birth year] [id number]   HPI:  Howard Jackson is a 69 y.o. male patient of Dr Sallyanne Kuster, with a PMH below who presents today for hypertension clinic evaluation.   In addition to left sided weakness from a stroke in 1999, he also suffers from a herniated lumbar disc and shoulder pain.  He takes tizanidine regularly to help with these, as well as hydrocodone or meloxicam only when necessary.  Today he states the shoulder pain is present, and that hs pressure is usually higher when the pain is significant.  He also notes some big swings in his pressure, this morning he was at 157/90, then 2 hours later (after morning meds) was at 115/77.  Cardiac Hx: storke in 1999 leaving him with left sided weakness; pheochromocytoma (benign) diagnosed in 1989   Social Hx: no tobacco, only occasional alcohol, drinks 1 cup of coffee each morning  Diet: low sodium, his ex-wife cooks meals and leaves them in his refrigerator, he does not eat out much  Home BP readings: today he brings a list of 34 home readings as well as his wrist BP cuff.  His cuff read within 10 points of the office reading.  Only 2 of the 34 home readings were [id number] systolic.     Current antihypertensive medications: nifedipine XR 30 mg bid, labetolol 300 mg bid, losartan hctz 100/12.5 mg qam, spironolactone 50 mg qam  Current Outpatient Prescriptions  Medication Sig Dispense Refill  . baclofen (LIORESAL) 10 MG tablet Take 10 mg by mouth 3 (three) times daily. Takes the baclofen after the Botox injections that occur every 90 days.    . clopidogrel (PLAVIX) 75 MG tablet Take 75 mg by mouth daily after breakfast.     . FLOVENT HFA 110 MCG/ACT inhaler Inhale 1 puff into the lungs 3 (three) times daily.   2  . fluticasone (FLONASE) 50 MCG/ACT nasal spray Place 2 sprays into both nostrils daily.  4  . labetalol (NORMODYNE) 300 MG tablet Take 300 mg by mouth 2 (two) times daily.     Marland Kitchen losartan-hydrochlorothiazide  (HYZAAR) 100-12.5 MG tablet Take 1 tablet by mouth daily. 90 tablet 3  . meloxicam (MOBIC) 15 MG tablet Take 1 tablet by mouth as needed.  1  . Methylsulfonylmethane (MSM) 1000 MG TABS Take 500 mg by mouth daily.    . Misc Natural Products (COLON CARE PO) Take 2 capsules by mouth every evening.     Marland Kitchen NIFEdipine (PROCARDIA-XL/ADALAT-CC/NIFEDICAL-XL) 30 MG 24 hr tablet Take 30 mg by mouth 2 (two) times daily.    . OnabotulinumtoxinA (BOTOX IJ) Inject as directed. Every 90 days to fingers and left shoulder    . pyridOXINE (VITAMIN B-6) 100 MG tablet Take 100 mg by mouth daily.    . sennosides-docusate sodium (SENOKOT-S) 8.6-50 MG tablet Take 2 tablets by mouth every evening.     . simvastatin (ZOCOR) 40 MG tablet Take 1 tablet by mouth daily.  5  . spironolactone (ALDACTONE) 50 MG tablet Take 1 tablet (50 mg total) by mouth daily. 30 tablet 6  . Tamsulosin HCl (FLOMAX) 0.4 MG CAPS Take 0.4 mg by mouth daily.      Marland Kitchen tiZANidine (ZANAFLEX) 4 MG tablet Take 4-8 mg by mouth at bedtime as needed. For muscle spasms    . triamcinolone cream (KENALOG) 0.1 % 2 (two) times daily.  2  . VENTOLIN HFA 108 (90 BASE) MCG/ACT inhaler as needed.  5   No current facility-administered  medications for this visit.    Allergies  Allergen Reactions  . Elavil [Amitriptyline Hcl] Hypertension    Past Medical History  Diagnosis Date  . Hypertension   . Adrenal gland disorder ([place])     tumor present - no change-  no recent increase    . Stroke ([place])     in [place], L sided weakness, wears a brace on L leg & uses cane  . H/O hiatal hernia   . Neuromuscular disorder ([place])     carpal tunnel problem, even after surgery release   . Arthritis   . Hammer toe   . Bunion     left foot  . Herniated lumbar intervertebral disc   . Coronary artery disease     cleared for surg. by Dr. Gwenlyn Found - [facility]  . Complication of anesthesia     "died on the table during cervical fusion" '89 - believed to be d/t "too much medication"  to treat HTN and was later dx with pheochromocytoma  . Peripheral arterial disease ([place])   . Hyperlipidemia   . Benign pheochromocytoma of right adrenal gland 06/23/2014    Never had histological diagnosis, but reportedly initially diagnosed in 1989  . History of anxiety   . Neurogenic bladder   . Primary aldosteronism ([facility])     Blood pressure 170/76, pulse 72, height 5\' 7"  (1.702 m), weight 172 lb 8 oz (78.245 kg).    Tommy Medal PharmD CPP [facility]

## 2015-05-25 NOTE — Assessment & Plan Note (Signed)
Although his pressure is elevated today in the office, he is appears to be uncomfortable and does complain of shoulder pain.  His home BP cuff read within 10 points of the office reading, and many of his home readings are running in the 123456 systolic.  Because of this, I don't want to add any more medications and risk his pressure dropping lower, perhaps causing him to have hypotensive symptoms.  I have asked him to continue with home BP monitoring and make sure he is compliant with medication timing.  He will call if he notices the lower readings trending upward.

## 2015-07-02 ENCOUNTER — Other Ambulatory Visit: Payer: Self-pay | Admitting: Internal Medicine

## 2015-07-02 DIAGNOSIS — E279 Disorder of adrenal gland, unspecified: Principal | ICD-10-CM

## 2015-07-02 DIAGNOSIS — E278 Other specified disorders of adrenal gland: Secondary | ICD-10-CM

## 2015-07-25 ENCOUNTER — Ambulatory Visit
Admission: RE | Admit: 2015-07-25 | Discharge: 2015-07-25 | Disposition: A | Discharge status: Home / Self Care | Payer: Medicare HMO | Source: Ambulatory Visit | Attending: Internal Medicine | Admitting: Internal Medicine

## 2015-07-25 DIAGNOSIS — E278 Other specified disorders of adrenal gland: Secondary | ICD-10-CM

## 2015-07-25 DIAGNOSIS — E279 Disorder of adrenal gland, unspecified: Principal | ICD-10-CM

## 2015-09-05 ENCOUNTER — Telehealth: Payer: Self-pay | Admitting: Cardiovascular Disease

## 2015-09-05 MED ORDER — NIFEDIPINE ER OSMOTIC RELEASE 30 MG PO TB24: 30.0000 mg | ORAL_TABLET | Freq: Two times a day (BID) | ORAL | Status: DC

## 2015-09-05 NOTE — Telephone Encounter (Signed)
New message      Pt c/o BP issue: STAT if pt c/o blurred vision, one-sided weakness or slurred speech  1. What are your last 5 BP readings? 111/79, 150/90, 160/90 2. Are you having any other symptoms (ex. Dizziness, headache, blurred vision, passed out)?  3. What is your BP issue? Pt states that his bp was high last night and he took an extra procardia 30mg .  Please advise

## 2015-09-05 NOTE — Telephone Encounter (Signed)
Pt notes still having issues w/ BP running high, mainly at night.  He took readings last night because he felt like he could tell it was running high. He noted systolic in [id number], 123456. He took an extra procardia 30mg . His AM read today was 111/79 before morning medications.  Pt inquiring on what to do. He notes no symptoms other than feeling like he can tell if his BP is high. Some general anxiety about this. Wanting instruction on meds.  Advised probably will need to have this reviewed by Erasmo Downer - he saw her for med mgmt in November. Routing for recommendations on home adjustments and/or further visit.

## 2015-09-05 NOTE — Telephone Encounter (Signed)
Have him take procardia xl 30 mg, 2 tabs each morning and 1 each evening.  Continue to monitor BP readings and see me in 2-3 weeks

## 2015-09-05 NOTE — Telephone Encounter (Signed)
Left msg for patient to call. 

## 2015-09-05 NOTE — Telephone Encounter (Signed)
Med recommendations given, new dose sent to pharmacy, BP visit scheduled.

## 2015-09-09 ENCOUNTER — Emergency Department (HOSPITAL_COMMUNITY): Payer: Medicare HMO

## 2015-09-09 ENCOUNTER — Emergency Department (HOSPITAL_COMMUNITY)
Admission: EM | Admit: 2015-09-09 | Discharge: 2015-09-09 | Disposition: A | Discharge status: Home / Self Care | Payer: Medicare HMO | Attending: Emergency Medicine | Admitting: Emergency Medicine

## 2015-09-09 DIAGNOSIS — Z9861 Coronary angioplasty status: Secondary | ICD-10-CM | POA: Diagnosis not present

## 2015-09-09 DIAGNOSIS — Z8673 Personal history of transient ischemic attack (TIA), and cerebral infarction without residual deficits: Secondary | ICD-10-CM | POA: Diagnosis not present

## 2015-09-09 DIAGNOSIS — M1990 Unspecified osteoarthritis, unspecified site: Secondary | ICD-10-CM | POA: Insufficient documentation

## 2015-09-09 DIAGNOSIS — I2510 Atherosclerotic heart disease of native coronary artery without angina pectoris: Secondary | ICD-10-CM | POA: Diagnosis not present

## 2015-09-09 DIAGNOSIS — Z86018 Personal history of other benign neoplasm: Secondary | ICD-10-CM | POA: Insufficient documentation

## 2015-09-09 DIAGNOSIS — G8929 Other chronic pain: Secondary | ICD-10-CM | POA: Diagnosis not present

## 2015-09-09 DIAGNOSIS — Z8659 Personal history of other mental and behavioral disorders: Secondary | ICD-10-CM | POA: Diagnosis not present

## 2015-09-09 DIAGNOSIS — Z7951 Long term (current) use of inhaled steroids: Secondary | ICD-10-CM | POA: Diagnosis not present

## 2015-09-09 DIAGNOSIS — I10 Essential (primary) hypertension: Secondary | ICD-10-CM

## 2015-09-09 DIAGNOSIS — E785 Hyperlipidemia, unspecified: Secondary | ICD-10-CM | POA: Insufficient documentation

## 2015-09-09 DIAGNOSIS — Z8719 Personal history of other diseases of the digestive system: Secondary | ICD-10-CM | POA: Insufficient documentation

## 2015-09-09 DIAGNOSIS — Z7902 Long term (current) use of antithrombotics/antiplatelets: Secondary | ICD-10-CM | POA: Diagnosis not present

## 2015-09-09 DIAGNOSIS — Z87448 Personal history of other diseases of urinary system: Secondary | ICD-10-CM | POA: Diagnosis not present

## 2015-09-09 DIAGNOSIS — R531 Weakness: Secondary | ICD-10-CM | POA: Diagnosis not present

## 2015-09-09 DIAGNOSIS — R51 Headache: Secondary | ICD-10-CM | POA: Diagnosis present

## 2015-09-09 LAB — BASIC METABOLIC PANEL
ANION GAP: 12 (ref 5–15)
BUN: 19 mg/dL (ref 6–20)
CALCIUM: 9.5 mg/dL (ref 8.9–10.3)
CO2: 22 mmol/L (ref 22–32)
Chloride: 99 mmol/L — ABNORMAL LOW (ref 101–111)
Creatinine, Ser: 1.29 mg/dL — ABNORMAL HIGH (ref 0.61–1.24)
GFR calc Af Amer: >60 mL/min (ref >60)
GFR calc non Af Amer: 54 mL/min — ABNORMAL LOW (ref >60)
GLUCOSE: 102 mg/dL — AB (ref 65–99)
POTASSIUM: 4.8 mmol/L (ref 3.5–5.1)
Sodium: 133 mmol/L — ABNORMAL LOW (ref 135–145)

## 2015-09-09 MED ORDER — HYDROCODONE-ACETAMINOPHEN 5-325 MG PO TABS
2.0000 | ORAL_TABLET | Freq: Once | ORAL | Status: AC
  Administered 2015-09-09: 2 via ORAL
  Filled 2015-09-09: qty 2

## 2015-09-09 NOTE — ED Notes (Signed)
Per EMS- pt here for evaluation of new onset headache and hypertension starting yesterday. Pt reports he is compliant with all medications. Pt has hx of stroke from '99 with left sided deficits. Pt reports his only other complaint is neck stiffness. Pt at home BPs were 200s/100s. Pt bp with EMS was 180/90. 18G PIV to R FA placed by EMS. Pt ambulatory with no acute distress.

## 2015-09-09 NOTE — Discharge Instructions (Signed)
Your creatinine today was 1.29. Follow-up with your Dr. for this Hypertension Hypertension, commonly called high blood pressure, is when the force of blood pumping through your arteries is too strong. Your arteries are the blood vessels that carry blood from your heart throughout your body. A blood pressure reading consists of a higher number over a lower number, such as 110/72. The higher number (systolic) is the pressure inside your arteries when your heart pumps. The lower number (diastolic) is the pressure inside your arteries when your heart relaxes. Ideally you want your blood pressure below 120/80. Hypertension forces your heart to work harder to pump blood. Your arteries may become narrow or stiff. Having untreated or uncontrolled hypertension can cause heart attack, stroke, kidney disease, and other problems. RISK FACTORS Some risk factors for high blood pressure are controllable. Others are not.  Risk factors you cannot control include:   Race. You may be at higher risk if you are African American.  Age. Risk increases with age.  Gender. Men are at higher risk than women before age 90 years. After age 73, women are at higher risk than men. Risk factors you can control include:  Not getting enough exercise or physical activity.  Being overweight.  Getting too much fat, sugar, calories, or salt in your diet.  Drinking too much alcohol. SIGNS AND SYMPTOMS Hypertension does not usually cause signs or symptoms. Extremely high blood pressure (hypertensive crisis) may cause headache, anxiety, shortness of breath, and nosebleed. DIAGNOSIS To check if you have hypertension, your health care provider will measure your blood pressure while you are seated, with your arm held at the level of your heart. It should be measured at least twice using the same arm. Certain conditions can cause a difference in blood pressure between your right and left arms. A blood pressure reading that is higher  than normal on one occasion does not mean that you need treatment. If it is not clear whether you have high blood pressure, you may be asked to return on a different day to have your blood pressure checked again. Or, you may be asked to monitor your blood pressure at home for 1 or more weeks. TREATMENT Treating high blood pressure includes making lifestyle changes and possibly taking medicine. Living a healthy lifestyle can help lower high blood pressure. You may need to change some of your habits. Lifestyle changes may include:  Following the DASH diet. This diet is high in fruits, vegetables, and whole grains. It is low in salt, red meat, and added sugars.  Keep your sodium intake below 2,300 mg per day.  Getting at least 30-45 minutes of aerobic exercise at least 4 times per week.  Losing weight if necessary.  Not smoking.  Limiting alcoholic beverages.  Learning ways to reduce stress. Your health care provider may prescribe medicine if lifestyle changes are not enough to get your blood pressure under control, and if one of the following is true:  You are 56-74 years of age and your systolic blood pressure is above 140.  You are 6 years of age or older, and your systolic blood pressure is above 150.  Your diastolic blood pressure is above 90.  You have diabetes, and your systolic blood pressure is over [id number] or your diastolic blood pressure is over 90.  You have kidney disease and your blood pressure is above 140/90.  You have heart disease and your blood pressure is above 140/90. Your personal target blood pressure may vary depending on  your medical conditions, your age, and other factors. HOME CARE INSTRUCTIONS  Have your blood pressure rechecked as directed by your health care provider.   Take medicines only as directed by your health care provider. Follow the directions carefully. Blood pressure medicines must be taken as prescribed. The medicine does not work as well when  you skip doses. Skipping doses also puts you at risk for problems.  Do not smoke.   Monitor your blood pressure at home as directed by your health care provider. SEEK MEDICAL CARE IF:   You think you are having a reaction to medicines taken.  You have recurrent headaches or feel dizzy.  You have swelling in your ankles.  You have trouble with your vision. SEEK IMMEDIATE MEDICAL CARE IF:  You develop a severe headache or confusion.  You have unusual weakness, numbness, or feel faint.  You have severe chest or abdominal pain.  You vomit repeatedly.  You have trouble breathing. MAKE SURE YOU:   Understand these instructions.  Will watch your condition.  Will get help right away if you are not doing well or get worse.   This information is not intended to replace advice given to you by your health care provider. Make sure you discuss any questions you have with your health care provider.   Document Released: 06/30/2005 Document Revised: 11/14/2014 Document Reviewed: 04/22/2013 Elsevier Interactive Patient Education [employer].

## 2015-09-09 NOTE — ED Notes (Signed)
Patient transported to CT 

## 2015-09-09 NOTE — ED Notes (Signed)
Pt refused to be placed in a gown.

## 2015-09-09 NOTE — ED Provider Notes (Signed)
CSN: [id number]     Arrival date & time 09/09/15  1647 History   First MD Initiated Contact with Patient 09/09/15 1649     Chief Complaint  Patient presents with  . Hypertension  . Headache     (Consider location/radiation/quality/duration/timing/severity/associated sxs/prior Treatment) HPI Comments: Patient here with onset of headache and increased blood pressure after taking an herbal supplement for pain. Blood pressure went up to 200/100. Called EMS and was 180/90. Describes the headache as being mild and on the right parietal occipital area. No associated vomiting. Has chronic neck pain from cervical disc disease. Denies any photophobia or visual changes. Denies any syncope or near-syncope. Denies any focal weakness. No treatment used for this prior to arrival.  Patient is a 70 y.o. male presenting with hypertension and headaches. The history is provided by the patient.  Hypertension Associated symptoms include headaches.  Headache   Past Medical History  Diagnosis Date  . Hypertension   . Adrenal gland disorder ([place])     tumor present - no change-  no recent increase    . Stroke ([place])     in [place], L sided weakness, wears a brace on L leg & uses cane  . H/O hiatal hernia   . Neuromuscular disorder ([place])     carpal tunnel problem, even after surgery release   . Arthritis   . Hammer toe   . Bunion     left foot  . Herniated lumbar intervertebral disc   . Coronary artery disease     cleared for surg. by Dr. Gwenlyn Found - [facility]  . Complication of anesthesia     "died on the table during cervical fusion" '89 - believed to be d/t "too much medication" to treat HTN and was later dx with pheochromocytoma  . Peripheral arterial disease ([place])   . Hyperlipidemia   . Benign pheochromocytoma of right adrenal gland 06/23/2014    Never had histological diagnosis, but reportedly initially diagnosed in 1989  . History of anxiety   . Neurogenic bladder   . Primary aldosteronism [facility])     Past Surgical History  Procedure Laterality Date  . Cervical fusion    . Coronary angioplasty with stent placement      pt reports having 5 stents in his heart-2011  . Carpal tunnel release      bilateral   . Inguinal hernia repair Left 11/18/2012    Procedure: HERNIA REPAIR INGUINAL ADULT;  Surgeon: Harl Bowie, MD;  Location: [facility];  Service: General;  Laterality: Left;  . Insertion of mesh Left 11/18/2012    Procedure: INSERTION OF MESH;  Surgeon: Harl Bowie, MD;  Location: [place];  Service: General;  Laterality: Left;  . Hernia repair     Family History  Problem Relation Age of Onset  . Hypertension Mother   . CVA Father   . Diabetes Father   . Alzheimer's disease Father   . Hypertension Sister   . Hypertension Brother   . CVA Paternal Grandmother   . Hypertension Brother    Social History  Substance Use Topics  . Smoking status: Never Smoker   . Smokeless tobacco: Never Used  . Alcohol Use: Yes     Comment: social - one drink, on special occasion     Review of Systems  Neurological: Positive for headaches.  All other systems reviewed and are negative.     Allergies  Elavil  Home Medications   Prior to Admission medications  Medication Sig Start Date End Date Taking? Authorizing Provider  baclofen (LIORESAL) 10 MG tablet Take 10 mg by mouth 3 (three) times daily. Takes the baclofen after the Botox injections that occur every 90 days.    Historical Provider, MD  clopidogrel (PLAVIX) 75 MG tablet Take 75 mg by mouth daily after breakfast.     Historical Provider, MD  FLOVENT HFA 110 MCG/ACT inhaler Inhale 1 puff into the lungs 3 (three) times daily.  04/13/14   Historical Provider, MD  fluticasone (FLONASE) 50 MCG/ACT nasal spray Place 2 sprays into both nostrils daily. 10/02/14   Historical Provider, MD  labetalol (NORMODYNE) 300 MG tablet Take 300 mg by mouth 2 (two) times daily.     Historical Provider, MD  losartan-hydrochlorothiazide (HYZAAR)  100-12.5 MG tablet Take 1 tablet by mouth daily. 05/04/15   Mihai Croitoru, MD  meloxicam (MOBIC) 15 MG tablet Take 1 tablet by mouth as needed. 06/16/14   Historical Provider, MD  Methylsulfonylmethane (MSM) 1000 MG TABS Take 500 mg by mouth daily.    Historical Provider, MD  Misc Natural Products (COLON CARE PO) Take 2 capsules by mouth every evening.     Historical Provider, MD  NIFEdipine (PROCARDIA-XL/ADALAT-CC/NIFEDICAL-XL) 30 MG 24 hr tablet Take 1 tablet (30 mg total) by mouth 2 (two) times daily. 09/05/15   Mihai Croitoru, MD  OnabotulinumtoxinA (BOTOX IJ) Inject as directed. Every 90 days to fingers and left shoulder    Historical Provider, MD  pyridOXINE (VITAMIN B-6) 100 MG tablet Take 100 mg by mouth daily.    Historical Provider, MD  sennosides-docusate sodium (SENOKOT-S) 8.6-50 MG tablet Take 2 tablets by mouth every evening.     Historical Provider, MD  simvastatin (ZOCOR) 40 MG tablet Take 1 tablet by mouth daily. 06/17/14   Historical Provider, MD  spironolactone (ALDACTONE) 50 MG tablet Take 1 tablet (50 mg total) by mouth daily. 10/26/14   Mihai Croitoru, MD  Tamsulosin HCl (FLOMAX) 0.4 MG CAPS Take 0.4 mg by mouth daily.      Historical Provider, MD  tiZANidine (ZANAFLEX) 4 MG tablet Take 4-8 mg by mouth at bedtime as needed. For muscle spasms    Historical Provider, MD  triamcinolone cream (KENALOG) 0.1 % 2 (two) times daily. 03/08/15   Historical Provider, MD  VENTOLIN HFA 108 (90 BASE) MCG/ACT inhaler as needed. 03/07/15   Historical Provider, MD   BP 155/82 mmHg  Pulse 75  Temp(Src) 98.6 F (37 C)  Resp 18  SpO2 100% Physical Exam  Constitutional: He is oriented to person, place, and time. He appears well-developed and well-nourished.  Non-toxic appearance. No distress.  HENT:  Head: Normocephalic and atraumatic.  Eyes: Conjunctivae, EOM and lids are normal. Pupils are equal, round, and reactive to light.  Neck: Normal range of motion. Neck supple. No tracheal deviation  present. No thyroid mass present.  Cardiovascular: Normal rate, regular rhythm and normal heart sounds.  Exam reveals no gallop.   No murmur heard. Pulmonary/Chest: Effort normal and breath sounds normal. No stridor. No respiratory distress. He has no decreased breath sounds. He has no wheezes. He has no rhonchi. He has no rales.  Abdominal: Soft. Normal appearance and bowel sounds are normal. He exhibits no distension. There is no tenderness. There is no rebound and no CVA tenderness.  Musculoskeletal: Normal range of motion. He exhibits no edema or tenderness.  Neurological: He is alert and oriented to person, place, and time. No cranial nerve deficit. GCS eye subscore is 4. GCS  verbal subscore is 5. GCS motor subscore is 6.  Patient has baseline left upper extremity weakness which is unchanged.  Skin: Skin is warm and dry. No abrasion and no rash noted.  Psychiatric: He has a normal mood and affect. His speech is normal and behavior is normal.  Nursing note and vitals reviewed.   ED Course  Procedures (including critical care time) Labs Review Labs Reviewed  BASIC METABOLIC PANEL    Imaging Review No results found. I have personally reviewed and evaluated these images and lab results as part of my medical decision-making.   EKG Interpretation None      MDM   Final diagnoses:  None    Patient given Vicodin feels better. Repeat blood pressure stable. Head CT without acute findings. Patient informed of his decreased renal function with his creatinine 1.29 and will follow-up with his Dr.    Lacretia Leigh, MD 09/09/15 [phone]

## 2015-09-13 ENCOUNTER — Telehealth: Payer: Self-pay | Admitting: Cardiovascular Disease

## 2015-09-13 NOTE — Telephone Encounter (Signed)
New script for procardia xl 30 mg 2 tabs in the am and 1 tab in the pm per 09-05-15 note given

## 2015-09-13 NOTE — Telephone Encounter (Signed)
Message: Verify dosage of his Nisedipine

## 2015-09-27 ENCOUNTER — Encounter: Payer: Medicare HMO | Admitting: Pharmacist Clinician (PhC)/ Clinical Pharmacy Specialist

## 2015-10-04 ENCOUNTER — Ambulatory Visit (INDEPENDENT_AMBULATORY_CARE_PROVIDER_SITE_OTHER): Payer: Medicare HMO | Admitting: Pharmacist Clinician (PhC)/ Clinical Pharmacy Specialist

## 2015-10-04 ENCOUNTER — Encounter: Payer: Self-pay | Admitting: Pharmacist Clinician (PhC)/ Clinical Pharmacy Specialist

## 2015-10-04 VITALS — BP 122/78 | HR 68 | Ht 67.0 in | Wt 170.0 lb

## 2015-10-04 DIAGNOSIS — I10 Essential (primary) hypertension: Secondary | ICD-10-CM | POA: Diagnosis not present

## 2015-10-04 MED ORDER — NIFEDIPINE ER OSMOTIC RELEASE 30 MG PO TB24: ORAL_TABLET | ORAL | Status: DC

## 2015-10-04 NOTE — Assessment & Plan Note (Addendum)
Patient reports today that his home BP has improved since he stopped taking bromelain and boswellia.  Neither is noted to cause elevated blood pressure, but I suggested that if he's doing better without, he should stay off.  He is feeling much better today than the last time I saw him, and his pressure is good at 122/78.  I suggested that if his home BP readings are ever < [id number] systolic, he could probably cut back on the morning nifedipine dose, but he is hesitant to alter his medications.  I will see him again as needed, should his BP become elevated again.

## 2015-10-04 NOTE — Progress Notes (Signed)
10/04/2015 EBEN BEZIO 1945-11-08 [id number]   HPI:  Howard Jackson is a 70 y.o. male patient of Dr Sallyanne Kuster, with a PMH below who presents today for hypertension clinic follow up.  I last saw him in November, on a day he was having severe shoulder pain.  At that time we chose not to change any of his medications, because he was having mostly good numbers at home, as well as some swings of 40 or more points in the hours after taking his meds.   In addition to left sided weakness from a stroke in 1999, he also suffers from a herniated lumbar disc and shoulder pain.  He takes tizanidine regularly to help with these, as well as hydrocodone or meloxicam only when necessary.  He was seen in the ER about a month ago for hypertension, and his primary MD then adjusted his medications, to decrease spironolactone from 50 mg to 25 mg, and increase nifedipine from 30 mg bid to 60 mg qam, 30 mg qpm.  He is excited to be going back to the [place] the month of [place], to see his daughter get married and spend some time with his 110+ year old mother.    Cardiac Hx: storke in 1999 leaving him with left sided weakness; pheochromocytoma (benign) diagnosed in 1989   Social Hx: no tobacco, only occasional alcohol, drinks 1 cup of coffee each morning  Diet: low sodium, his ex-wife cooks meals and leaves them in his refrigerator, he does not eat out much  Home BP readings: did not bring list of readings, however did bring cuff, which again read within 5 points of office cuff     Current antihypertensive medications: nifedipine XR 60 mg qam, 30 mg qpm, labetolol 300 mg bid, losartan hctz 100/12.5 mg qam, spironolactone 25 mg qam  Current Outpatient Prescriptions  Medication Sig Dispense Refill  . baclofen (LIORESAL) 10 MG tablet Take 10 mg by mouth 3 (three) times daily. Takes the baclofen after the Botox injections that occur every 90 days.    . clopidogrel (PLAVIX) 75 MG tablet Take 75 mg by mouth  daily after breakfast.     . FLOVENT HFA 110 MCG/ACT inhaler Inhale 1 puff into the lungs 3 (three) times daily.   2  . fluticasone (FLONASE) 50 MCG/ACT nasal spray Place 2 sprays into both nostrils daily.  4  . labetalol (NORMODYNE) 300 MG tablet Take 300 mg by mouth 2 (two) times daily.     Marland Kitchen losartan-hydrochlorothiazide (HYZAAR) 100-12.5 MG tablet Take 1 tablet by mouth daily. 90 tablet 3  . meloxicam (MOBIC) 15 MG tablet Take 1 tablet by mouth as needed.  1  . Methylsulfonylmethane (MSM) 1000 MG TABS Take 500 mg by mouth daily.    . Misc Natural Products (COLON CARE PO) Take 2 capsules by mouth every evening.     Marland Kitchen NIFEdipine (PROCARDIA-XL/ADALAT-CC/NIFEDICAL-XL) 30 MG 24 hr tablet Take 2 tablets by mouth each morning and 1 tablet each evening 270 tablet 1  . OnabotulinumtoxinA (BOTOX IJ) Inject as directed. Every 90 days to fingers and left shoulder    . pyridOXINE (VITAMIN B-6) 100 MG tablet Take 100 mg by mouth daily.    . sennosides-docusate sodium (SENOKOT-S) 8.6-50 MG tablet Take 2 tablets by mouth every evening.     . simvastatin (ZOCOR) 40 MG tablet Take 1 tablet by mouth daily.  5  . spironolactone (ALDACTONE) 25 MG tablet Take 25 mg by mouth daily.  5  . Tamsulosin HCl (FLOMAX) 0.4 MG CAPS Take 0.4 mg by mouth daily.      Marland Kitchen tiZANidine (ZANAFLEX) 4 MG tablet Take 4-8 mg by mouth at bedtime as needed. For muscle spasms    . triamcinolone cream (KENALOG) 0.1 % 2 (two) times daily.  2  . VENTOLIN HFA 108 (90 BASE) MCG/ACT inhaler as needed.  5   No current facility-administered medications for this visit.    Allergies  Allergen Reactions  . Elavil [Amitriptyline Hcl] Hypertension    Past Medical History  Diagnosis Date  . Hypertension   . Adrenal gland disorder ([place])     tumor present - no change-  no recent increase    . Stroke ([place])     in [place], L sided weakness, wears a brace on L leg & uses cane  . H/O hiatal hernia   . Neuromuscular disorder ([place])     carpal  tunnel problem, even after surgery release   . Arthritis   . Hammer toe   . Bunion     left foot  . Herniated lumbar intervertebral disc   . Coronary artery disease     cleared for surg. by Dr. Gwenlyn Found - [facility]  . Complication of anesthesia     "died on the table during cervical fusion" '89 - believed to be d/t "too much medication" to treat HTN and was later dx with pheochromocytoma  . Peripheral arterial disease ([place])   . Hyperlipidemia   . Benign pheochromocytoma of right adrenal gland 06/23/2014    Never had histological diagnosis, but reportedly initially diagnosed in 1989  . History of anxiety   . Neurogenic bladder   . Primary aldosteronism ([facility])     Blood pressure 122/78, pulse 68, height 5\' 7"  (1.702 m), weight 170 lb (77.111 kg).    Tommy Medal PharmD CPP [facility]

## 2015-10-04 NOTE — Patient Instructions (Signed)
Return for a a follow up appointment in   Your blood pressure today is 122/78   (goal is < 150/90)  Check your blood pressure at home daily and keep record of the readings.  Take your BP meds as follows:  Continue with your current medications.  Can try cutting back on nifiedipine to 1 capsule twice daily if morning BP is < [id number] systolic.  Otherwise continue with 2 each morning and 1 each evening  Bring all of your meds, your BP cuff and your record of home blood pressures to your next appointment.  Exercise as you're able, try to walk approximately 30 minutes per day.  Keep salt intake to a minimum, especially watch canned and prepared boxed foods.  Eat more fresh fruits and vegetables and fewer canned items.  Avoid eating in fast food restaurants.    HOW TO TAKE YOUR BLOOD PRESSURE: . Rest 5 minutes before taking your blood pressure. .  Don't smoke or drink caffeinated beverages for at least 30 minutes before. . Take your blood pressure before (not after) you eat. . Sit comfortably with your back supported and both feet on the floor (don't cross your legs). . Elevate your arm to heart level on a table or a desk. . Use the proper sized cuff. It should fit smoothly and snugly around your bare upper arm. There should be enough room to slip a fingertip under the cuff. The bottom edge of the cuff should be 1 inch above the crease of the elbow. . Ideally, take 3 measurements at one sitting and record the average.

## 2015-10-23 ENCOUNTER — Other Ambulatory Visit: Payer: Self-pay | Admitting: Cardiovascular Disease

## 2015-10-23 NOTE — Telephone Encounter (Signed)
Med refilled 10/04/15 by K. Alvstad at BP clinic appt Called pharmacy to confirm that med is on [facility] will not cover 90 day supply per pharmacy Patient said that his insurance company told him it will cover 90 day supply  Explained to patient the situation.  Patient states that [facility] sent something over stating an override was needed? Patient is very irate with situation regarding this Rx and has been getting mixed info from the pharmacy and the insurance company.  Explained that the med refill was [facility] with the correct quantity.   He states he will call [facility] to check on this Rx again. Apologized for the inconvenience.

## 2015-10-23 NOTE — Telephone Encounter (Signed)
Medication is not due spoke with pharmacist and he stated medication requires PA because [employer] only cover 2.5 tabs daily and Dr C has patient on 3 tabs daily. He states he is going to fax over authorization and our prior auth person will handle it.   [email or web address]

## 2015-10-23 NOTE — Telephone Encounter (Signed)
PA faxed to [facility] routed to [place]

## 2015-10-23 NOTE — Telephone Encounter (Signed)
°*  STAT* If patient is at the pharmacy, call can be transferred to refill team.   1. Which medications need to be refilled? (please list name of each medication and dose if known) Nifedipine 30mg (BID)  2. Which pharmacy/location (including street and city if local pharmacy) is [facility] to[facility] [address]   3. Do they need a 30 day or 90 day supply? 90 days ( he [place] leaving [place] country on [place]

## 2015-10-30 NOTE — Telephone Encounter (Signed)
I haven't received a thing on this patient.

## 2015-10-31 NOTE — Telephone Encounter (Signed)
Prior authorization info form was faxed to [employer] on day documented.

## 2016-04-23 ENCOUNTER — Other Ambulatory Visit: Payer: Self-pay | Admitting: Cardiovascular Disease

## 2016-04-23 NOTE — Telephone Encounter (Signed)
New message       *STAT* If patient is at the pharmacy, call can be transferred to refill team.   1. Which medications need to be refilled? (please list name of each medication and dose if known)  Losartan HCTZ 100-12.5 2. Which pharmacy/location (including street and city if local pharmacy) is medication to be sent to? [facility] [address] 3. Do they need a 30 day or 90 day supply? 90 day

## 2016-05-01 MED ORDER — LOSARTAN POTASSIUM-HCTZ 100-12.5 MG PO TABS: 1.0000 | ORAL_TABLET | Freq: Every day | ORAL | 0 refills | Status: DC

## 2016-05-22 ENCOUNTER — Ambulatory Visit (INDEPENDENT_AMBULATORY_CARE_PROVIDER_SITE_OTHER): Payer: Medicare HMO | Admitting: Family Medicine

## 2016-05-22 ENCOUNTER — Encounter: Payer: Self-pay | Admitting: Family Medicine

## 2016-05-22 VITALS — BP 151/70 | HR 57 | Temp 97.4°F | Ht 67.0 in | Wt 170.0 lb

## 2016-05-22 DIAGNOSIS — Z23 Encounter for immunization: Secondary | ICD-10-CM

## 2016-05-22 DIAGNOSIS — M799 Soft tissue disorder, unspecified: Secondary | ICD-10-CM

## 2016-05-22 DIAGNOSIS — Z1159 Encounter for screening for other viral diseases: Secondary | ICD-10-CM

## 2016-05-22 DIAGNOSIS — I2510 Atherosclerotic heart disease of native coronary artery without angina pectoris: Secondary | ICD-10-CM

## 2016-05-22 DIAGNOSIS — Z114 Encounter for screening for human immunodeficiency virus [HIV]: Secondary | ICD-10-CM | POA: Diagnosis not present

## 2016-05-22 LAB — COMPLETE METABOLIC PANEL WITH GFR
ALBUMIN: 4.4 g/dL (ref 3.6–5.1)
ALT: 30 U/L (ref 9–46)
AST: 22 U/L (ref 10–35)
Alkaline Phosphatase: 53 U/L (ref 40–115)
BILIRUBIN TOTAL: 0.5 mg/dL (ref 0.2–1.2)
BUN: 16 mg/dL (ref 7–25)
CO2: 25 mmol/L (ref 20–31)
Calcium: 9.4 mg/dL (ref 8.6–10.3)
Chloride: 99 mmol/L (ref 98–110)
Creat: 1.21 mg/dL — ABNORMAL HIGH (ref 0.70–1.18)
GFR, EST NON AFRICAN AMERICAN: 59 mL/min — AB (ref >=60)
GFR, Est African American: 69 mL/min (ref >=60)
Glucose, Bld: 80 mg/dL (ref 65–99)
POTASSIUM: 4.5 mmol/L (ref 3.5–5.3)
Sodium: 134 mmol/L — ABNORMAL LOW (ref 135–146)
TOTAL PROTEIN: 6.8 g/dL (ref 6.1–8.1)

## 2016-05-22 LAB — CBC WITH DIFFERENTIAL/PLATELET
BASOS ABS: 0 {cells}/uL (ref 0–200)
Basophils Relative: 0 %
EOS ABS: 117 {cells}/uL (ref 15–500)
Eosinophils Relative: 3 %
HCT: 42.2 % (ref 38.5–50.0)
Hemoglobin: 14.4 g/dL (ref 13.2–17.1)
LYMPHS PCT: 46 %
Lymphs Abs: 1794 cells/uL (ref 850–3900)
MCH: 33.6 pg — AB (ref 27.0–33.0)
MCHC: 34.1 g/dL (ref 32.0–36.0)
MCV: 98.4 fL (ref 80.0–100.0)
MONOS PCT: 13 %
MPV: 11.1 fL (ref 7.5–12.5)
Monocytes Absolute: 507 cells/uL (ref 200–950)
NEUTROS ABS: 1482 {cells}/uL — AB (ref 1500–7800)
NEUTROS PCT: 38 %
PLATELETS: 188 10*3/uL (ref 140–400)
RBC: 4.29 MIL/uL (ref 4.20–5.80)
RDW: 13.4 % (ref 11.0–15.0)
WBC: 3.9 10*3/uL (ref 3.8–10.8)

## 2016-05-22 LAB — LIPID PANEL
Cholesterol: 136 mg/dL (ref <200)
HDL: 44 mg/dL (ref >40)
LDL Cholesterol: 77 mg/dL
TRIGLYCERIDES: 73 mg/dL (ref <150)
Total CHOL/HDL Ratio: 3.1 Ratio (ref <5.0)
VLDL: 15 mg/dL (ref <30)

## 2016-05-22 LAB — HEMOGLOBIN A1C
HEMOGLOBIN A1C: 5.5 % (ref <5.7)
MEAN PLASMA GLUCOSE: 111 mg/dL

## 2016-05-22 MED ORDER — TIZANIDINE HCL 4 MG PO TABS: 4.0000 mg | ORAL_TABLET | Freq: Every evening | ORAL | 0 refills | Status: DC | PRN

## 2016-05-22 NOTE — Patient Instructions (Signed)
Your Howard Jackson was sent to your pharmacy on file. Please make an appoint with your cardiologist Come back here in 3 months.

## 2016-05-22 NOTE — Progress Notes (Signed)
Howard Jackson, is a 70 y.o. male  [id number]  [id number]  DOB - December 20, 1945  CC:  Chief Complaint  Patient presents with  . Establish Care  . Medication Refill  . Cough    coughing up flem.        HPI: Howard Jackson is a 70 y.o. male here to establish care. He has a very complicated history and many specialist. He have cardiovascular disease with a coronary stent and sees cardiology who also manages his hypertension. He is SP CVA in 1999 and has a neuroloogist (Dr. Reather Converse). . He has residual weakness and spasticity on the left. He has receive Botox for this from orthopedist in [place]. (Dr. Hassell Done). He request a referral back to him. He has cataracts and has Dr. Katy Fitch as an opthalmologist. He has an enlarged prostate with urinary frequency and urgency as has a urologist. He has an adrenal gland disorder and is followed by Dr.J. Buddy Duty. He has problems with his feet and needs surgery but his podiatrist will not do with his current Cardiovascular issues. Before transferring here his PCP was York Ram at [facility]. His most worrisome problem is the pain in his back and left arm and shoulder. He does request a refill on Xanaflex which he takes at bedtime.   Health Maintenance: He will receive a flu shot today. He thinks his tdap and pneumonia are up to date, but he will check. He reports being current with his colon cancer screening.    Allergies  Allergen Reactions  . Elavil [Amitriptyline Hcl] Hypertension   Past Medical History:  Diagnosis Date  . Adrenal gland disorder ([place])    tumor present - no change-  no recent increase    . Arthritis   . Benign pheochromocytoma of right adrenal gland 06/23/2014   Never had histological diagnosis, but reportedly initially diagnosed in 1989  . Bunion    left foot  . Complication of anesthesia    "died on the table during cervical fusion" '89 - believed to be d/t "too much medication" to treat HTN and  was later dx with pheochromocytoma  . Coronary artery disease    cleared for surg. by Dr. Gwenlyn Found - [facility]  . H/O hiatal hernia   . Hammer toe   . Herniated lumbar intervertebral disc   . History of anxiety   . Hyperlipidemia   . Hypertension   . Neurogenic bladder   . Neuromuscular disorder ([place])    carpal tunnel problem, even after surgery release   . Peripheral arterial disease ([place])   . Primary aldosteronism ([place])   . Stroke [facility])    in [place], L sided weakness, wears a brace on L leg & uses cane   Current Outpatient Prescriptions on File Prior to Visit  Medication Sig Dispense Refill  . baclofen (LIORESAL) 10 MG tablet Take 10 mg by mouth 3 (three) times daily. Takes the baclofen after the Botox injections that occur every 90 days.    . clopidogrel (PLAVIX) 75 MG tablet Take 75 mg by mouth daily after breakfast.     . FLOVENT HFA 110 MCG/ACT inhaler Inhale 1 puff into the lungs 3 (three) times daily.   2  . fluticasone (FLONASE) 50 MCG/ACT nasal spray Place 2 sprays into both nostrils daily.  4  . labetalol (NORMODYNE) 300 MG tablet Take 300 mg by mouth 2 (two) times daily.     Marland Kitchen losartan-hydrochlorothiazide (HYZAAR) 100-12.5 MG tablet Take 1 tablet by  mouth daily. 30 tablet 0  . meloxicam (MOBIC) 15 MG tablet Take 1 tablet by mouth as needed.  1  . NIFEdipine (PROCARDIA-XL/ADALAT-CC/NIFEDICAL-XL) 30 MG 24 hr tablet Take 2 tablets by mouth each morning and 1 tablet each evening 270 tablet 1  . simvastatin (ZOCOR) 40 MG tablet Take 1 tablet by mouth daily.  5  . spironolactone (ALDACTONE) 25 MG tablet Take 25 mg by mouth daily.  5  . Tamsulosin HCl (FLOMAX) 0.4 MG CAPS Take 0.4 mg by mouth daily.      Marland Kitchen triamcinolone cream (KENALOG) 0.1 % 2 (two) times daily.  2  . VENTOLIN HFA 108 (90 BASE) MCG/ACT inhaler as needed.  5  . Methylsulfonylmethane (MSM) 1000 MG TABS Take 500 mg by mouth daily.    . Misc Natural Products (COLON CARE PO) Take 2 capsules by mouth every evening.     .  OnabotulinumtoxinA (BOTOX IJ) Inject as directed. Every 90 days to fingers and left shoulder    . pyridOXINE (VITAMIN B-6) 100 MG tablet Take 100 mg by mouth daily.    . sennosides-docusate sodium (SENOKOT-S) 8.6-50 MG tablet Take 2 tablets by mouth every evening.      No current facility-administered medications on file prior to visit.    Family History  Problem Relation Age of Onset  . CVA Father   . Diabetes Father   . Alzheimer's disease Father   . Hypertension Sister   . Hypertension Brother   . CVA Paternal Grandmother   . Hypertension Brother   . Hypertension Mother    Social History   Social History  . Marital status: Legally Separated    Spouse name: N/A  . Number of children: N/A  . Years of education: N/A   Occupational History  . Not on file.   Social History Main Topics  . Smoking status: Never Smoker  . Smokeless tobacco: Never Used  . Alcohol use Yes     Comment: social - one drink, on special occasion   . Drug use: No  . Sexual activity: Not on file   Other Topics Concern  . Not on file   Social History Narrative  . No narrative on file    Review of Systems: Constitutional: Negative Skin: Negative HENT: Negative  Eyes: + for cataracts Neck: Negative Respiratory: + for shortness of breath, coughing, coughs up some sputum Cardiovascular: Negative Gastrointestinal: + for IBS with constipation Genitourinary: + for frequency and urgency Musculoskeletal: Back, shoulder, leg and foot pain Neurological: Negative for Hematological: Negative  Psychiatric/Behavioral: Negative    Objective:   Vitals:   05/22/16 1348  BP: (!) 151/70  Pulse: (!) 57  Temp: 97.4 F (36.3 C)    Physical Exam: Constitutional: Patient appears well-developed and well-nourished. No distress. HENT: Normocephalic, atraumatic, External right and left ear normal. Oropharynx is clear and moist.  Eyes: Conjunctivae and EOM are normal. PERRLA, no scleral icterus. Neck:  Normal ROM. Neck supple. No lymphadenopathy, No thyromegaly. CVS: RRR, S1/S2 +, no murmurs, no gallops, no rubs Pulmonary: Effort and breath sounds normal, no stridor, rhonchi, wheezes, rales.  Abdominal: Soft. Normoactive BS,, no distension, tenderness, rebound or guarding.  Musculoskeletal: Normal range of motion. No edema and no tenderness.  Neuro: Alert.Normal muscle tone coordination. Non-focal Skin: Skin is warm and dry. No rash noted. Not diaphoretic. No erythema. No pallor. Psychiatric: Normal mood and affect. Behavior, judgment, thought content normal.  Lab Results  Component Value Date   WBC 4.3 04/18/2015  HGB 14.0 04/18/2015   HCT 40.1 04/18/2015   MCV 95.7 04/18/2015   PLT 202 04/18/2015   Lab Results  Component Value Date   CREATININE 1.29 (H) 09/09/2015   BUN 19 09/09/2015   NA 133 (L) 09/09/2015   K 4.8 09/09/2015   CL 99 (L) 09/09/2015   CO2 22 09/09/2015    Lab Results  Component Value Date   HGBA1C (H) 10/18/2009    6.4 (NOTE) The ADA recommends the following therapeutic goal for glycemic control related to Hgb A1c measurement: Goal of therapy: <6.5 Hgb A1c  Reference: American Diabetes Association: Clinical Practice Recommendations 2010, Diabetes Care, 2010, 33: (Suppl  1).   Lipid Panel     Component Value Date/Time   CHOL  10/21/2009 0340    120        ATP III CLASSIFICATION:  <200     mg/dL   Desirable  200-239  mg/dL   Borderline High  >=240    mg/dL   High          TRIG 55 10/21/2009 0340   HDL 46 10/21/2009 0340   CHOLHDL 2.6 10/21/2009 0340   VLDL 11 10/21/2009 0340   LDLCALC  10/21/2009 0340    63        Total Cholesterol/HDL:CHD Risk Coronary Heart Disease Risk Table                     Men   Women  1/2 Average Risk   3.4   3.3  Average Risk       5.0   4.4  2 X Average Risk   9.6   7.1  3 X Average Risk  23.4   11.0        Use the calculated Patient Ratio above and the CHD Risk Table to determine the patient's CHD Risk.         ATP III CLASSIFICATION (LDL):  <100     mg/dL   Optimal  100-129  mg/dL   Near or Above                    Optimal  130-159  mg/dL   Borderline  160-189  mg/dL   High  >190     mg/dL   Very High        Assessment and plan:   1. Coronary artery disease involving native heart, angina presence unspecified, unspecified vessel or lesion type  - Hemoglobin A1c - CBC with Differential - COMPLETE METABOLIC PANEL WITH GFR - Lipid panel -Follow-up cardiology    2. Encounter for hepatitis C screening test for low risk patient  - Hepatitis C Antibody  3. Screening for HIV (human immunodeficiency virus)  - HIV antibody (with reflex)  4. Need for prophylactic vaccination and inoculation against influenza  - Flu Vaccine QUAD 36+ mos PF IM (Fluarix & Fluzone Quad PF)  5. S/P CVA with residual weakness and spascity -Refill on Xanaflex per medication list. -Follow-up with neurologist.  6. Hypertension -Continue current medications -Call and set up appt with cardiology.    Return in about 3 months (around 08/22/2016).  The patient was given clear instructions to go to ER or return to medical center if symptoms don't improve, worsen or new problems develop. The patient verbalized understanding.    Micheline Chapman FNP  05/22/2016, 3:04 PM

## 2016-05-23 LAB — HIV ANTIBODY (ROUTINE TESTING W REFLEX): HIV 1&2 Ab, 4th Generation: NONREACTIVE

## 2016-05-23 LAB — HEPATITIS C ANTIBODY: HCV Ab: NEGATIVE

## 2016-05-30 ENCOUNTER — Telehealth: Payer: Self-pay

## 2016-06-02 MED ORDER — TIZANIDINE HCL 4 MG PO TABS: 4.0000 mg | ORAL_TABLET | Freq: Every evening | ORAL | 0 refills | Status: DC | PRN

## 2016-06-02 NOTE — Telephone Encounter (Signed)
Spoke to pharmacy and changed to 90 days supply. Thanks!

## 2016-06-12 ENCOUNTER — Encounter: Payer: Self-pay | Admitting: Family Medicine

## 2016-06-12 ENCOUNTER — Ambulatory Visit (INDEPENDENT_AMBULATORY_CARE_PROVIDER_SITE_OTHER): Payer: Medicare HMO | Admitting: Family Medicine

## 2016-06-12 VITALS — BP 141/60 | HR 67 | Temp 97.4°F | Resp 16 | Ht 67.0 in | Wt 169.0 lb

## 2016-06-12 DIAGNOSIS — R059 Cough, unspecified: Secondary | ICD-10-CM

## 2016-06-12 DIAGNOSIS — R05 Cough: Secondary | ICD-10-CM

## 2016-06-12 MED ORDER — SULFAMETHOXAZOLE-TRIMETHOPRIM 800-160 MG PO TABS: 1.0000 | ORAL_TABLET | Freq: Two times a day (BID) | ORAL | 0 refills | Status: DC

## 2016-06-12 MED ORDER — GUAIFENESIN-CODEINE 100-10 MG/5ML PO SYRP: 5.0000 mL | ORAL_SOLUTION | Freq: Three times a day (TID) | ORAL | 0 refills | Status: DC | PRN

## 2016-06-12 NOTE — Patient Instructions (Signed)
Use two medications as prescribed Drink lots of liquids

## 2016-06-12 NOTE — Progress Notes (Signed)
Jamesedward Neels, is a 70 y.o. male  [id number]  [id number]  DOB - 12/06/1945  CC:  Chief Complaint  Patient presents with  . Cough    flem  . Shortness of Breath  . Urinary Incontinence       HPI: Howard Jackson is a 70 y.o. male here for sick visit. He complains of cough lasting 2 weeks and worsening over last few days. In addition to cough, he has shortness of breath, rib cage pain and yellow/green sputum. He denies fever, chills, Does endorse some wheezing. He is using his flovent.    Allergies  Allergen Reactions  . Elavil [Amitriptyline Hcl] Hypertension   Past Medical History:  Diagnosis Date  . Adrenal gland disorder ([place])    tumor present - no change-  no recent increase    . Arthritis   . Benign pheochromocytoma of right adrenal gland 06/23/2014   Never had histological diagnosis, but reportedly initially diagnosed in 1989  . Bunion    left foot  . Complication of anesthesia    "died on the table during cervical fusion" '89 - believed to be d/t "too much medication" to treat HTN and was later dx with pheochromocytoma  . Coronary artery disease    cleared for surg. by Dr. Gwenlyn Found - [facility]  . H/O hiatal hernia   . Hammer toe   . Herniated lumbar intervertebral disc   . History of anxiety   . Hyperlipidemia   . Hypertension   . Neurogenic bladder   . Neuromuscular disorder ([place])    carpal tunnel problem, even after surgery release   . Peripheral arterial disease ([place])   . Primary aldosteronism ([place])   . Stroke [facility])    in [place], L sided weakness, wears a brace on L leg & uses cane   Current Outpatient Prescriptions on File Prior to Visit  Medication Sig Dispense Refill  . clopidogrel (PLAVIX) 75 MG tablet Take 75 mg by mouth daily after breakfast.     . FLOVENT HFA 110 MCG/ACT inhaler Inhale 1 puff into the lungs 3 (three) times daily.   2  . fluticasone (FLONASE) 50 MCG/ACT nasal spray Place 2 sprays into both nostrils daily.  4  . labetalol  (NORMODYNE) 300 MG tablet Take 300 mg by mouth 2 (two) times daily.     Marland Kitchen losartan-hydrochlorothiazide (HYZAAR) 100-12.5 MG tablet Take 1 tablet by mouth daily. 30 tablet 0  . meloxicam (MOBIC) 15 MG tablet Take 1 tablet by mouth as needed.  1  . Methylsulfonylmethane (MSM) 1000 MG TABS Take 500 mg by mouth daily.    . Misc Natural Products (COLON CARE PO) Take 2 capsules by mouth every evening.     . naproxen (NAPROSYN) 500 MG tablet Take 500 mg by mouth 2 (two) times daily with a meal.    . NIFEdipine (PROCARDIA-XL/ADALAT-CC/NIFEDICAL-XL) 30 MG 24 hr tablet Take 2 tablets by mouth each morning and 1 tablet each evening 270 tablet 1  . OnabotulinumtoxinA (BOTOX IJ) Inject as directed. Every 90 days to fingers and left shoulder    . pyridOXINE (VITAMIN B-6) 100 MG tablet Take 100 mg by mouth daily.    . sennosides-docusate sodium (SENOKOT-S) 8.6-50 MG tablet Take 2 tablets by mouth every evening.     . simvastatin (ZOCOR) 40 MG tablet Take 1 tablet by mouth daily.  5  . spironolactone (ALDACTONE) 25 MG tablet Take 25 mg by mouth daily.  5  . Tamsulosin HCl (FLOMAX) 0.4 MG  CAPS Take 0.4 mg by mouth daily.      Marland Kitchen tiZANidine (ZANAFLEX) 4 MG tablet Take 1-2 tablets (4-8 mg total) by mouth at bedtime as needed. For muscle spasms 90 tablet 0  . triamcinolone cream (KENALOG) 0.1 % 2 (two) times daily.  2  . VENTOLIN HFA 108 (90 BASE) MCG/ACT inhaler as needed.  5   No current facility-administered medications on file prior to visit.    Family History  Problem Relation Age of Onset  . CVA Father   . Diabetes Father   . Alzheimer's disease Father   . Hypertension Sister   . Hypertension Brother   . CVA Paternal Grandmother   . Hypertension Brother   . Hypertension Mother    Social History   Social History  . Marital status: Legally Separated    Spouse name: N/A  . Number of children: N/A  . Years of education: N/A   Occupational History  . Not on file.   Social History Main Topics  .  Smoking status: Never Smoker  . Smokeless tobacco: Never Used  . Alcohol use Yes     Comment: social - one drink, on special occasion   . Drug use: No  . Sexual activity: Not on file   Other Topics Concern  . Not on file   Social History Narrative  . No narrative on file    Review of Systems: See HPI   Objective:   Vitals:   06/12/16 1036  BP: (!) 141/60  Pulse: 67  Resp: 16  Temp: 97.4 F (36.3 C)    Physical Exam: Constitutional: Patient appears well-developed and well-nourished. No distress. HENT: Normocephalic, atraumatic, External right and left ear normal. Oropharynx is clear and moist.  Eyes: Conjunctivae and EOM are normal. PERRLA, no scleral icterus. Neck: Normal ROM. Neck supple. No lymphadenopathy, No thyromegaly. CVS: RRR, S1/S2 +, no murmurs, no gallops, no rubs Pulmonary: Effort  normal, no stridor, There are scattered rhonchi. Neuro: Alert.Normal muscle tone coordination. Non-focal Skin: Skin is warm and dry. No rash noted. Not diaphoretic. No erythema. No pallor. Psychiatric: Normal mood and affect. Behavior, judgment, thought content normal.  Lab Results  Component Value Date   WBC 3.9 05/22/2016   HGB 14.4 05/22/2016   HCT 42.2 05/22/2016   MCV 98.4 05/22/2016   PLT 188 05/22/2016   Lab Results  Component Value Date   CREATININE 1.21 (H) 05/22/2016   BUN 16 05/22/2016   NA 134 (L) 05/22/2016   K 4.5 05/22/2016   CL 99 05/22/2016   CO2 25 05/22/2016    Lab Results  Component Value Date   HGBA1C 5.5 05/22/2016   Lipid Panel     Component Value Date/Time   CHOL 136 05/22/2016 1435   TRIG 73 05/22/2016 1435   HDL 44 05/22/2016 1435   CHOLHDL 3.1 05/22/2016 1435   VLDL 15 05/22/2016 1435   LDLCALC 77 05/22/2016 1435        Assessment and plan:   1. Cough   - sulfamethoxazole-trimethoprim (BACTRIM DS,SEPTRA DS) 800-160 MG tablet; Take 1 tablet by mouth 2 (two) times daily.  Dispense: 10 tablet; Refill: 0 - guaiFENesin-codeine  (ROBITUSSIN AC) 100-10 MG/5ML syrup; Take 5 mLs by mouth 3 (three) times daily as needed for cough.  Dispense: 120 mL; Refill: 0   Return if symptoms worsen or fail to improve.  The patient was given clear instructions to go to ER or return to medical center if symptoms don't improve, worsen or  new problems develop. The patient verbalized understanding.    Micheline Chapman FNP  06/12/2016, 12:07 PM

## 2016-06-16 ENCOUNTER — Telehealth: Payer: Self-pay

## 2016-06-16 ENCOUNTER — Other Ambulatory Visit: Payer: Self-pay | Admitting: Family Medicine

## 2016-06-16 MED ORDER — CEPHALEXIN 500 MG PO CAPS: 500.0000 mg | ORAL_CAPSULE | Freq: Four times a day (QID) | ORAL | 0 refills | Status: DC

## 2016-06-16 NOTE — Telephone Encounter (Signed)
Patient states he is not improved on Sulfa, and it is causing constipation. Will send Keflex per Sharon Seller, and patient will call back if symptoms increase, or do not improve. He is drinking lots of fluids.

## 2016-06-18 ENCOUNTER — Ambulatory Visit (HOSPITAL_COMMUNITY)
Admission: RE | Admit: 2016-06-18 | Discharge: 2016-06-18 | Disposition: A | Discharge status: Home / Self Care | Payer: Medicare HMO | Source: Ambulatory Visit | Attending: Family Medicine | Admitting: Family Medicine

## 2016-06-18 ENCOUNTER — Encounter: Payer: Self-pay | Admitting: Family Medicine

## 2016-06-18 ENCOUNTER — Ambulatory Visit (INDEPENDENT_AMBULATORY_CARE_PROVIDER_SITE_OTHER): Payer: Medicare HMO | Admitting: Family Medicine

## 2016-06-18 VITALS — BP 125/58 | HR 64 | Temp 98.2°F | Resp 18 | Ht 67.0 in | Wt 171.0 lb

## 2016-06-18 DIAGNOSIS — R05 Cough: Secondary | ICD-10-CM

## 2016-06-18 DIAGNOSIS — J4520 Mild intermittent asthma, uncomplicated: Secondary | ICD-10-CM

## 2016-06-18 DIAGNOSIS — I700 Atherosclerosis of aorta: Secondary | ICD-10-CM | POA: Diagnosis not present

## 2016-06-18 DIAGNOSIS — R062 Wheezing: Secondary | ICD-10-CM

## 2016-06-18 DIAGNOSIS — R918 Other nonspecific abnormal finding of lung field: Secondary | ICD-10-CM | POA: Insufficient documentation

## 2016-06-18 DIAGNOSIS — R059 Cough, unspecified: Secondary | ICD-10-CM | POA: Insufficient documentation

## 2016-06-18 MED ORDER — VENTOLIN HFA 108 (90 BASE) MCG/ACT IN AERS: 1.0000 | INHALATION_SPRAY | Freq: Four times a day (QID) | RESPIRATORY_TRACT | 1 refills | Status: DC | PRN

## 2016-06-18 MED ORDER — BENZONATATE 100 MG PO CAPS: 100.0000 mg | ORAL_CAPSULE | Freq: Three times a day (TID) | ORAL | 0 refills | Status: DC | PRN

## 2016-06-18 NOTE — Progress Notes (Signed)
Subjective:    Patient ID: Howard Jackson, male    DOB: [birth year], 70 y.o.   MRN: [id number]  Asthma  He complains of cough, shortness of breath and wheezing. There is no chest tightness, frequent throat clearing, hemoptysis or hoarse voice. Primary symptoms comments: Howard Jackson was seen 1 week ago for a cough. He completed Bactrim without relief. Previous provider prescribed Keflex, patient continues to take Keflex. . This is a recurrent problem. The current episode started 1 to 4 weeks ago. The problem occurs intermittently. The problem has been gradually worsening. The cough is productive of sputum and productive. Associated symptoms include chest pain, dyspnea on exertion and rhinorrhea. Pertinent negatives include no ear congestion, fever, headaches, heartburn, malaise/fatigue, nasal congestion, orthopnea, sneezing, sore throat, sweats, trouble swallowing or weight loss. His symptoms are aggravated by change in weather and any activity. He reports no improvement on treatment. His symptoms are not alleviated by beta-agonist. His past medical history is significant for asthma.  Howard Jackson is also complaining of left abdominal pain with coughing.  Past Medical History:  Diagnosis Date  . Adrenal gland disorder ([place])    tumor present - no change-  no recent increase    . Arthritis   . Benign pheochromocytoma of right adrenal gland 06/23/2014   Never had histological diagnosis, but reportedly initially diagnosed in 1989  . Bunion    left foot  . Complication of anesthesia    "died on the table during cervical fusion" '89 - believed to be d/t "too much medication" to treat HTN and was later dx with pheochromocytoma  . Coronary artery disease    cleared for surg. by Dr. Gwenlyn Found - [facility]  . H/O hiatal hernia   . Hammer toe   . Herniated lumbar intervertebral disc   . History of anxiety   . Hyperlipidemia   . Hypertension   . Neurogenic bladder   . Neuromuscular disorder ([place])    carpal tunnel  problem, even after surgery release   . Peripheral arterial disease ([place])   . Primary aldosteronism ([place])   . Stroke [facility])    in [place], L sided weakness, wears a brace on L leg & uses cane   Social History   Social History  . Marital status: Legally Separated    Spouse name: N/A  . Number of children: N/A  . Years of education: N/A   Occupational History  . Not on file.   Social History Main Topics  . Smoking status: Never Smoker  . Smokeless tobacco: Never Used  . Alcohol use Yes     Comment: social - one drink, on special occasion   . Drug use: No  . Sexual activity: Not on file   Other Topics Concern  . Not on file   Social History Narrative  . No narrative on file   Review of Systems  Constitutional: Negative for fever, malaise/fatigue and weight loss.  HENT: Positive for rhinorrhea. Negative for hoarse voice, sneezing, sore throat and trouble swallowing.   Eyes: Negative.   Respiratory: Positive for cough, shortness of breath and wheezing. Negative for hemoptysis.   Cardiovascular: Positive for chest pain and dyspnea on exertion.  Gastrointestinal: Negative.  Negative for heartburn.  Endocrine: Negative.   Genitourinary: Negative.   Musculoskeletal: Negative.   Skin: Negative.   Allergic/Immunologic: Negative.   Neurological: Negative.  Negative for headaches.  Hematological: Negative.   Psychiatric/Behavioral: Negative.        Objective:   Physical  Exam  Constitutional: He is oriented to person, place, and time. He appears well-developed and well-nourished.  HENT:  Head: Normocephalic and atraumatic.  Right Ear: External ear normal.  Left Ear: External ear normal.  Nose: Nose normal.  Mouth/Throat: Oropharynx is clear and moist.  Eyes: Conjunctivae and EOM are normal. Pupils are equal, round, and reactive to light.  Neck: Normal range of motion. Neck supple.  Cardiovascular: Normal rate, regular rhythm, normal heart sounds and intact distal pulses.    Pulmonary/Chest: No respiratory distress. He has no decreased breath sounds. He has wheezes in the right upper field and the left upper field. He has no rhonchi. He has no rales. He exhibits tenderness (Left lower).  Abdominal: There is tenderness in the left upper quadrant.  Musculoskeletal: Normal range of motion.  Neurological: He is alert and oriented to person, place, and time. He has normal reflexes.  Skin: Skin is warm and dry.  Psychiatric: He has a normal mood and affect. His behavior is normal. Judgment and thought content normal.      BP (!) 125/58 (BP Location: Right Arm, Patient Position: Sitting, Cuff Size: Large)   Pulse 64   Temp 98.2 F (36.8 C) (Oral)   Resp 18   Ht 5\' 7"  (1.702 m)   Wt 171 lb (77.6 kg)   SpO2 98%   BMI 26.78 kg/m  Assessment & Plan:  1. Mild intermittent asthma, unspecified whether complicated Will follow up by phone with chest xray results - DG Chest 2 View; Future - VENTOLIN HFA 108 (90 Base) MCG/ACT inhaler; Inhale 1 puff into the lungs every 6 (six) hours as needed.  Dispense: 1 Inhaler; Refill: 1  2. Wheezing - DG Chest 2 View; Future - VENTOLIN HFA 108 (90 Base) MCG/ACT inhaler; Inhale 1 puff into the lungs every 6 (six) hours as needed.  Dispense: 1 Inhaler; Refill: 1  3. Cough - benzonatate (TESSALON) 100 MG capsule; Take 1 capsule (100 mg total) by mouth 3 (three) times daily as needed for cough.  Dispense: 30 capsule; Refill: 0 - VENTOLIN HFA 108 (90 Base) MCG/ACT inhaler; Inhale 1 puff into the lungs every 6 (six) hours as needed.  Dispense: 1 Inhaler; Refill: 1   RTC: Howard Jackson is to follow up in 1 week by phone.    Dorena Dew, FNP

## 2016-06-18 NOTE — Patient Instructions (Addendum)
Report to [facility] for a chest xray. Will follow up by phone Asthma: Ventolin 1 puff every 6 hours for wheezing, shortness of breath, or cough as needed Cough: Start Tessalon Perles 3 times per day for persistent cough  Call clinic on 06/25/2016 to schedule follow-up  Asthma, Adult Asthma is a condition of the lungs in which the airways tighten and narrow. Asthma can make it hard to breathe. Asthma cannot be cured, but medicine and lifestyle changes can help control it. Asthma may be started (triggered) by:  Animal skin flakes (dander).  Dust.  Cockroaches.  Pollen.  Mold.  Smoke.  Cleaning products.  Hair sprays or aerosol sprays.  Paint fumes or strong smells.  Cold air, weather changes, and winds.  Crying or laughing hard.  Stress.  Certain medicines or drugs.  Foods, such as dried fruit, potato chips, and sparkling grape juice.  Infections or conditions (colds, flu).  Exercise.  Certain medical conditions or diseases.  Exercise or tiring activities. Follow these instructions at home:  Take medicine as told by your doctor.  Use a peak flow meter as told by your doctor. A peak flow meter is a tool that measures how well the lungs are working.  Record and keep track of the peak flow meter's readings.  Understand and use the asthma action plan. An asthma action plan is a written plan for taking care of your asthma and treating your attacks.  To help prevent asthma attacks:  Do not smoke. Stay away from secondhand smoke.  Change your heating and air conditioning filter often.  Limit your use of fireplaces and wood stoves.  Get rid of pests (such as roaches and mice) and their droppings.  Throw away plants if you see mold on them.  Clean your floors. Dust regularly. Use cleaning products that do not smell.  Have someone vacuum when you are not home. Use a vacuum cleaner with a HEPA filter if possible.  Replace carpet with wood, tile, or vinyl  flooring. Carpet can trap animal skin flakes and dust.  Use allergy-proof pillows, mattress covers, and box spring covers.  Wash bed sheets and blankets every week in hot water and dry them in a dryer.  Use blankets that are made of polyester or cotton.  Clean bathrooms and kitchens with bleach. If possible, have someone repaint the walls in these rooms with mold-resistant paint. Keep out of the rooms that are being cleaned and painted.  Wash hands often. Contact a doctor if:  You have make a whistling sound when breaking (wheeze), have shortness of breath, or have a cough even if taking medicine to prevent attacks.  The colored mucus you cough up (sputum) is thicker than usual.  The colored mucus you cough up changes from clear or white to yellow, green, gray, or bloody.  You have problems from the medicine you are taking such as:  A rash.  Itching.  Swelling.  Trouble breathing.  You need reliever medicines more than 2-3 times a week.  Your peak flow measurement is still at 50-79% of your personal best after following the action plan for 1 hour.  You have a fever. Get help right away if:  You seem to be worse and are not responding to medicine during an asthma attack.  You are short of breath even at rest.  You get short of breath when doing very little activity.  You have trouble eating, drinking, or talking.  You have chest pain.  You  have a fast heartbeat.  Your lips or fingernails start to turn blue.  You are light-headed, dizzy, or faint.  Your peak flow is less than 50% of your personal best. This information is not intended to replace advice given to you by your health care provider. Make sure you discuss any questions you have with your health care provider. Document Released: 12/17/2007 Document Revised: 12/06/2015 Document Reviewed: 01/27/2013 Elsevier Interactive Patient Education  2017 [employer]  Cough, Adult Introduction A cough helps to  clear your throat and lungs. A cough may last only 2-3 weeks (acute), or it may last longer than 8 weeks (chronic). Many different things can cause a cough. A cough may be a sign of an illness or another medical condition. Follow these instructions at home:  Pay attention to any changes in your cough.  Take medicines only as told by your doctor.  If you were prescribed an antibiotic medicine, take it as told by your doctor. Do not stop taking it even if you start to feel better.  Talk with your doctor before you try using a cough medicine.  Drink enough fluid to keep your pee (urine) clear or pale yellow.  If the air is dry, use a cold steam vaporizer or humidifier in your home.  Stay away from things that make you cough at work or at home.  If your cough is worse at night, try using extra pillows to raise your head up higher while you sleep.  Do not smoke, and try not to be around smoke. If you need help quitting, ask your doctor.  Do not have caffeine.  Do not drink alcohol.  Rest as needed. Contact a doctor if:  You have new problems (symptoms).  You cough up yellow fluid (pus).  Your cough does not get better after 2-3 weeks, or your cough gets worse.  Medicine does not help your cough and you are not sleeping well.  You have pain that gets worse or pain that is not helped with medicine.  You have a fever.  You are losing weight and you do not know why.  You have night sweats. Get help right away if:  You cough up blood.  You have trouble breathing.  Your heartbeat is very fast. This information is not intended to replace advice given to you by your health care provider. Make sure you discuss any questions you have with your health care provider. Document Released: 03/13/2011 Document Revised: 12/06/2015 Document Reviewed: 09/06/2014  2017 Elsevier  Abdominal Pain, Adult Many things can cause belly (abdominal) pain. Most times, belly pain is not dangerous.  Many cases of belly pain can be watched and treated at home. Sometimes belly pain is serious, though. Your doctor will try to find the cause of your belly pain. Follow these instructions at home:  Take over-the-counter and prescription medicines only as told by your doctor. Do not take medicines that help you poop (laxatives) unless told to by your doctor.  Drink enough fluid to keep your pee (urine) clear or pale yellow.  Watch your belly pain for any changes.  Keep all follow-up visits as told by your doctor. This is important. Contact a doctor if:  Your belly pain changes or gets worse.  You are not hungry, or you lose weight without trying.  You are having trouble pooping (constipated) or have watery poop (diarrhea) for more than 2-3 days.  You have pain when you pee or poop.  Your belly pain wakes  you up at night.  Your pain gets worse with meals, after eating, or with certain foods.  You are throwing up and cannot keep anything down.  You have a fever. Get help right away if:  Your pain does not go away as soon as your doctor says it should.  You cannot stop throwing up.  Your pain is only in areas of your belly, such as the right side or the left lower part of the belly.  You have bloody or black poop, or poop that looks like tar.  You have very bad pain, cramping, or bloating in your belly.  You have signs of not having enough fluid or water in your body (dehydration), such as:  Dark pee, very little pee, or no pee.  Cracked lips.  Dry mouth.  Sunken eyes.  Sleepiness.  Weakness. This information is not intended to replace advice given to you by your health care provider. Make sure you discuss any questions you have with your health care provider. Document Released: 12/17/2007 Document Revised: 01/18/2016 Document Reviewed: 12/12/2015 Elsevier Interactive Patient Education  2017 [employer].

## 2016-06-19 ENCOUNTER — Telehealth: Payer: Self-pay

## 2016-06-19 NOTE — Telephone Encounter (Signed)
-----   Message from Dorena Dew, [place] sent at 06/19/2016  8:31 AM EST ----- Regarding: lab results Please inform patient that chest xray is consistent with asthma. Recommend Ventolin (1-2 puffs) every 6 hours for shortness of breath, persistent coughing, and/or wheezing.   Thanks ----- Message ----- From: Interface, Rad Results In Sent: 06/18/2016   2:55 PM To: Dorena Dew, FNP

## 2016-06-19 NOTE — Telephone Encounter (Signed)
Called and advised of Xray results and the need to continue ventolin as directed. Patient verbalized understanding. Thanks!

## 2016-06-24 ENCOUNTER — Ambulatory Visit (INDEPENDENT_AMBULATORY_CARE_PROVIDER_SITE_OTHER): Payer: Medicare HMO | Admitting: Family Medicine

## 2016-06-24 ENCOUNTER — Encounter: Payer: Self-pay | Admitting: Family Medicine

## 2016-06-24 VITALS — BP 156/62 | HR 55 | Temp 97.8°F | Resp 16 | Ht 67.0 in | Wt 170.0 lb

## 2016-06-24 DIAGNOSIS — I10 Essential (primary) hypertension: Secondary | ICD-10-CM | POA: Diagnosis not present

## 2016-06-24 DIAGNOSIS — R1012 Left upper quadrant pain: Secondary | ICD-10-CM | POA: Diagnosis not present

## 2016-06-24 MED ORDER — LOSARTAN POTASSIUM-HCTZ 100-25 MG PO TABS: 1.0000 | ORAL_TABLET | Freq: Every day | ORAL | 1 refills | Status: DC

## 2016-06-24 NOTE — Progress Notes (Signed)
Subjective:    Patient ID: [facility], male    DOB: Dec 04, 1945, 70 y.o.   MRN: [id number]  Abdominal Pain  This is a recurrent problem. The current episode started 1 to 4 weeks ago. The onset quality is gradual. The problem occurs constantly. The problem has been unchanged. The pain is located in the LUQ. The pain is at a severity of 10/10. The pain is severe. The quality of the pain is aching. The abdominal pain does not radiate. Pertinent negatives include no anorexia, arthralgias, belching, constipation, diarrhea, dysuria, fever, flatus, frequency, headaches, hematuria, melena, myalgias, nausea, vomiting or weight loss. The pain is aggravated by coughing. He has tried nothing for the symptoms. His past medical history is significant for GERD. There is no history of irritable bowel syndrome or ulcerative colitis.  [facility] Jackson is also complaining of left abdominal pain with coughing. 10/10.  Past Medical History:  Diagnosis Date  . Adrenal gland disorder ([place])    tumor present - no change-  no recent increase    . Arthritis   . Benign pheochromocytoma of right adrenal gland 06/23/2014   Never had histological diagnosis, but reportedly initially diagnosed in 1989  . Bunion    left foot  . Complication of anesthesia    "died on the table during cervical fusion" '89 - believed to be d/t "too much medication" to treat HTN and was later dx with pheochromocytoma  . Coronary artery disease    cleared for surg. by Dr. Gwenlyn Found - [facility]  . H/O hiatal hernia   . Hammer toe   . Herniated lumbar intervertebral disc   . History of anxiety   . Hyperlipidemia   . Hypertension   . Neurogenic bladder   . Neuromuscular disorder ([place])    carpal tunnel problem, even after surgery release   . Peripheral arterial disease ([place])   . Primary aldosteronism ([place])   . Stroke [facility])    in [place], L sided weakness, wears a brace on L leg & uses cane   Social History   Social History  . Marital status:  Legally Separated    Spouse name: N/A  . Number of children: N/A  . Years of education: N/A   Occupational History  . Not on file.   Social History Main Topics  . Smoking status: Never Smoker  . Smokeless tobacco: Never Used  . Alcohol use Yes     Comment: social - one drink, on special occasion   . Drug use: No  . Sexual activity: Not on file   Other Topics Concern  . Not on file   Social History Narrative  . No narrative on file   Review of Systems  Constitutional: Negative for fever, malaise/fatigue and weight loss.  HENT: Positive for rhinorrhea. Negative for hoarse voice, sneezing, sore throat and trouble swallowing.   Eyes: Negative.   Respiratory: Positive for cough. Negative for hemoptysis.   Cardiovascular: Positive for chest pain and dyspnea on exertion.  Gastrointestinal: Positive for abdominal distention and abdominal pain. Negative for anorexia, constipation, diarrhea, flatus, heartburn, melena, nausea and vomiting.  Endocrine: Negative.   Genitourinary: Negative for dysuria, frequency and hematuria.  Musculoskeletal: Negative.  Negative for arthralgias and myalgias.  Skin: Negative.   Allergic/Immunologic: Negative.   Neurological: Negative.  Negative for headaches.  Hematological: Negative.   Psychiatric/Behavioral: Negative.        Objective:   Physical Exam  Constitutional: He is oriented to person, place, and time. He  appears well-developed and well-nourished.  HENT:  Head: Normocephalic and atraumatic.  Right Ear: External ear normal.  Left Ear: External ear normal.  Nose: Nose normal.  Mouth/Throat: Oropharynx is clear and moist.  Eyes: Conjunctivae and EOM are normal. Pupils are equal, round, and reactive to light.  Neck: Normal range of motion. Neck supple.  Cardiovascular: Normal rate, regular rhythm, normal heart sounds and intact distal pulses.   Pulmonary/Chest: Effort normal. No respiratory distress. He has no decreased breath sounds. He  has no rhonchi. He has no rales.  Abdominal: There is tenderness in the left upper quadrant.  Musculoskeletal: Normal range of motion.  Neurological: He is alert and oriented to person, place, and time. He has normal reflexes.  Skin: Skin is warm and dry.  Psychiatric: He has a normal mood and affect. His behavior is normal. Judgment and thought content normal.      BP (!) 156/62 (BP Location: Right Arm, Patient Position: Sitting, Cuff Size: Normal)   Pulse (!) 55   Temp 97.8 F (36.6 C) (Oral)   Resp 16   Ht 5\' 7"  (1.702 m)   Wt 170 lb (77.1 kg)   SpO2 97%   BMI 26.63 kg/m  Assessment & Plan:  1. Abdominal pain, left upper quadrant Reviewed most recent laboratory values. Will follow up with patient after reviewing abdominal ultrasound.  - US Abdomen Complete; Future  2. Essential hypertension Blood pressure is above goal on current medication regimen. Will increase Hyzaar to 100-25 mg daily. Will follow up in 1 month.  - losartan-hydrochlorothiazide (HYZAAR) 100-25 MG tablet; Take 1 tablet by mouth daily.  Dispense: 30 tablet; Refill: 1  RTC: Will follow up by phone with ultrasound results   Dorena Dew, FNP

## 2016-06-24 NOTE — Patient Instructions (Addendum)
Will review abdominal ultrasound as it becomes available.  Will increase Hyzaar to 100-25 mg daily The patient is asked to make an attempt to improve diet and exercise patterns to aid in medical management of this problem. Follow up in 3 months for chronic conditions Abdominal Pain, Adult Many things can cause belly (abdominal) pain. Most times, belly pain is not dangerous. Many cases of belly pain can be watched and treated at home. Sometimes belly pain is serious, though. Your doctor will try to find the cause of your belly pain. Follow these instructions at home:  Take over-the-counter and prescription medicines only as told by your doctor. Do not take medicines that help you poop (laxatives) unless told to by your doctor.  Drink enough fluid to keep your pee (urine) clear or pale yellow.  Watch your belly pain for any changes.  Keep all follow-up visits as told by your doctor. This is important. Contact a doctor if:  Your belly pain changes or gets worse.  You are not hungry, or you lose weight without trying.  You are having trouble pooping (constipated) or have watery poop (diarrhea) for more than 2-3 days.  You have pain when you pee or poop.  Your belly pain wakes you up at night.  Your pain gets worse with meals, after eating, or with certain foods.  You are throwing up and cannot keep anything down.  You have a fever. Get help right away if:  Your pain does not go away as soon as your doctor says it should.  You cannot stop throwing up.  Your pain is only in areas of your belly, such as the right side or the left lower part of the belly.  You have bloody or black poop, or poop that looks like tar.  You have very bad pain, cramping, or bloating in your belly.  You have signs of not having enough fluid or water in your body (dehydration), such as:  Dark pee, very little pee, or no pee.  Cracked lips.  Dry mouth.  Sunken  eyes.  Sleepiness.  Weakness. This information is not intended to replace advice given to you by your health care provider. Make sure you discuss any questions you have with your health care provider. Document Released: 12/17/2007 Document Revised: 01/18/2016 Document Reviewed: 12/12/2015 Elsevier Interactive Patient Education  2017 [employer].

## 2016-06-25 ENCOUNTER — Telehealth: Payer: Self-pay

## 2016-06-25 NOTE — Telephone Encounter (Signed)
Tried to call patient to advised of Ultrasound scheduled for Friday 06/27/2016 @9am  at [facility]. Patient needs to arrive at 8:45am with Nothing to eat or drink after midnight the night before. Patient did not answer. I will try to call back later. Thanks!

## 2016-06-27 ENCOUNTER — Ambulatory Visit (HOSPITAL_COMMUNITY): Payer: Medicare HMO

## 2016-06-27 ENCOUNTER — Ambulatory Visit (HOSPITAL_COMMUNITY)
Admission: RE | Admit: 2016-06-27 | Discharge: 2016-06-27 | Disposition: A | Discharge status: Home / Self Care | Payer: Medicare HMO | Source: Ambulatory Visit | Attending: Family Medicine | Admitting: Family Medicine

## 2016-06-27 ENCOUNTER — Encounter (HOSPITAL_COMMUNITY): Payer: Self-pay

## 2016-06-27 DIAGNOSIS — R1012 Left upper quadrant pain: Secondary | ICD-10-CM | POA: Insufficient documentation

## 2016-06-30 ENCOUNTER — Telehealth: Payer: Self-pay

## 2016-06-30 NOTE — Telephone Encounter (Signed)
-----   Message from Dorena Dew, [place] sent at 06/27/2016  4:51 PM EST ----- Regarding: ultrasound results  Please inform Mr. Bondoc that abdominal ultrasound was unremarkable. I will send a referral to gastroenterology and will defer to them for further treatment and workup.  Thanks ----- Message ----- From: Interface, Rad Results In Sent: 06/27/2016   2:02 PM To: Dorena Dew, FNP

## 2016-06-30 NOTE — Telephone Encounter (Signed)
Called and spoke with patient, advised of ultrasound results and that we will be sending to gastroenterology.

## 2016-07-03 ENCOUNTER — Other Ambulatory Visit: Payer: Self-pay | Admitting: Cardiovascular Disease

## 2016-07-04 ENCOUNTER — Ambulatory Visit (HOSPITAL_COMMUNITY): Payer: Medicare HMO

## 2016-07-06 ENCOUNTER — Other Ambulatory Visit: Payer: Self-pay | Admitting: Family Medicine

## 2016-07-10 ENCOUNTER — Encounter (HOSPITAL_COMMUNITY): Payer: Self-pay | Admitting: Emergency Medicine

## 2016-07-10 ENCOUNTER — Emergency Department (HOSPITAL_COMMUNITY): Payer: Medicare HMO

## 2016-07-10 ENCOUNTER — Emergency Department (HOSPITAL_COMMUNITY)
Admission: EM | Admit: 2016-07-10 | Discharge: 2016-07-10 | Disposition: A | Discharge status: Home / Self Care | Payer: Medicare HMO | Attending: Emergency Medicine | Admitting: Emergency Medicine

## 2016-07-10 DIAGNOSIS — I10 Essential (primary) hypertension: Secondary | ICD-10-CM | POA: Diagnosis not present

## 2016-07-10 DIAGNOSIS — M542 Cervicalgia: Secondary | ICD-10-CM | POA: Insufficient documentation

## 2016-07-10 DIAGNOSIS — R252 Cramp and spasm: Secondary | ICD-10-CM | POA: Diagnosis not present

## 2016-07-10 DIAGNOSIS — R05 Cough: Secondary | ICD-10-CM | POA: Insufficient documentation

## 2016-07-10 DIAGNOSIS — Z955 Presence of coronary angioplasty implant and graft: Secondary | ICD-10-CM | POA: Diagnosis not present

## 2016-07-10 DIAGNOSIS — R51 Headache: Secondary | ICD-10-CM | POA: Insufficient documentation

## 2016-07-10 DIAGNOSIS — I2510 Atherosclerotic heart disease of native coronary artery without angina pectoris: Secondary | ICD-10-CM | POA: Insufficient documentation

## 2016-07-10 DIAGNOSIS — Z8673 Personal history of transient ischemic attack (TIA), and cerebral infarction without residual deficits: Secondary | ICD-10-CM | POA: Diagnosis not present

## 2016-07-10 DIAGNOSIS — R519 Headache, unspecified: Secondary | ICD-10-CM

## 2016-07-10 DIAGNOSIS — R059 Cough, unspecified: Secondary | ICD-10-CM

## 2016-07-10 LAB — CBC WITH DIFFERENTIAL/PLATELET
BASOS ABS: 0 10*3/uL (ref 0.0–0.1)
Basophils Relative: 0 %
EOS PCT: 1 %
Eosinophils Absolute: 0 10*3/uL (ref 0.0–0.7)
HEMATOCRIT: 39.6 % (ref 39.0–52.0)
Hemoglobin: 13.7 g/dL (ref 13.0–17.0)
LYMPHS ABS: 1.8 10*3/uL (ref 0.7–4.0)
LYMPHS PCT: 31 %
MCH: 33.1 pg (ref 26.0–34.0)
MCHC: 34.6 g/dL (ref 30.0–36.0)
MCV: 95.7 fL (ref 78.0–100.0)
MONOS PCT: 11 %
Monocytes Absolute: 0.7 10*3/uL (ref 0.1–1.0)
NEUTROS ABS: 3.4 10*3/uL (ref 1.7–7.7)
Neutrophils Relative %: 57 %
Platelets: 195 10*3/uL (ref 150–400)
RBC: 4.14 MIL/uL — ABNORMAL LOW (ref 4.22–5.81)
RDW: 12.9 % (ref 11.5–15.5)
WBC: 5.9 10*3/uL (ref 4.0–10.5)

## 2016-07-10 LAB — COMPREHENSIVE METABOLIC PANEL
ALT: 30 U/L (ref 17–63)
AST: 27 U/L (ref 15–41)
Albumin: 4.3 g/dL (ref 3.5–5.0)
Alkaline Phosphatase: 49 U/L (ref 38–126)
Anion gap: 10 (ref 5–15)
BILIRUBIN TOTAL: 1 mg/dL (ref 0.3–1.2)
BUN: 21 mg/dL — AB (ref 6–20)
CO2: 22 mmol/L (ref 22–32)
Calcium: 9 mg/dL (ref 8.9–10.3)
Chloride: 100 mmol/L — ABNORMAL LOW (ref 101–111)
Creatinine, Ser: 1.14 mg/dL (ref 0.61–1.24)
Glucose, Bld: 102 mg/dL — ABNORMAL HIGH (ref 65–99)
POTASSIUM: 4.2 mmol/L (ref 3.5–5.1)
Sodium: 132 mmol/L — ABNORMAL LOW (ref 135–145)
TOTAL PROTEIN: 6.9 g/dL (ref 6.5–8.1)

## 2016-07-10 LAB — URINALYSIS, ROUTINE W REFLEX MICROSCOPIC
BILIRUBIN URINE: NEGATIVE
Glucose, UA: NEGATIVE mg/dL
HGB URINE DIPSTICK: NEGATIVE
Ketones, ur: NEGATIVE mg/dL
Leukocytes, UA: NEGATIVE
NITRITE: NEGATIVE
PROTEIN: NEGATIVE mg/dL
Specific Gravity, Urine: 1.008 (ref 1.005–1.030)
pH: 5 (ref 5.0–8.0)

## 2016-07-10 MED ORDER — METOCLOPRAMIDE HCL 5 MG/ML IJ SOLN
10.0000 mg | Freq: Once | INTRAMUSCULAR | Status: AC
  Administered 2016-07-10: 10 mg via INTRAVENOUS
  Filled 2016-07-10: qty 2

## 2016-07-10 MED ORDER — DIPHENHYDRAMINE HCL 50 MG/ML IJ SOLN
25.0000 mg | Freq: Once | INTRAMUSCULAR | Status: AC
  Administered 2016-07-10: 25 mg via INTRAVENOUS
  Filled 2016-07-10: qty 1

## 2016-07-10 MED ORDER — MELOXICAM 7.5 MG PO TABS: 7.5000 mg | ORAL_TABLET | ORAL | 0 refills | Status: DC | PRN

## 2016-07-10 MED ORDER — HYDROCODONE-ACETAMINOPHEN 5-325 MG PO TABS: 1.0000 | ORAL_TABLET | ORAL | 0 refills | Status: DC | PRN

## 2016-07-10 MED ORDER — DEXAMETHASONE SODIUM PHOSPHATE 10 MG/ML IJ SOLN
10.0000 mg | Freq: Once | INTRAMUSCULAR | Status: AC
  Administered 2016-07-10: 10 mg via INTRAVENOUS
  Filled 2016-07-10: qty 1

## 2016-07-10 NOTE — ED Notes (Signed)
ED Provider at bedside. 

## 2016-07-10 NOTE — ED Triage Notes (Addendum)
Per EMS, patient from home, c/o headache since this morning. Denies dizziness, SOB, and chest pain. A&Ox4. Ambulatory with EMS.   Hx left sided deficits from previous stroke. Denies blurred vision and weakness.

## 2016-07-10 NOTE — ED Notes (Signed)
Patient was alert, oriented and stable upon discharge. RN went over AVS and patient had no further questions.  

## 2016-07-10 NOTE — ED Provider Notes (Signed)
[facility] DEPT Provider Note   CSN: [id number] Arrival date & time: 07/10/16  1344     History   Chief Complaint Chief Complaint  Patient presents with  . Headache    HPI Howard Jackson is a 70 y.o. male.  HPI 70 year old male with extensive past medical history as below who presents with multiple complaints. Patient states that over the last several weeks, he has had a "bronchitis" for which she has been on multiple antibiotics. He has been coughing but this has improved over the last several days. However, over the last several days, he developed gradual onset of aching, throbbing posterior neck and headache pain. He has a history of chronic neck pain status post surgery. He describes the pain as worse with position changes and movement radiating to his left shoulder. The headache did not begin acutely. Denies any fevers. No nausea or vomiting. He also reports chronic urinary frequency and has a history of BPH. Denies any abdominal pain. No fevers. Denies any vision changes. No nasal congestion or rhinorrhea.  Past Medical History:  Diagnosis Date  . Adrenal gland disorder ([place])    tumor present - no change-  no recent increase    . Arthritis   . Benign pheochromocytoma of right adrenal gland 06/23/2014   Never had histological diagnosis, but reportedly initially diagnosed in 1989  . Bunion    left foot  . Complication of anesthesia    "died on the table during cervical fusion" '89 - believed to be d/t "too much medication" to treat HTN and was later dx with pheochromocytoma  . Coronary artery disease    cleared for surg. by Dr. Gwenlyn Found - [facility]  . H/O hiatal hernia   . Hammer toe   . Herniated lumbar intervertebral disc   . History of anxiety   . Hyperlipidemia   . Hypertension   . Neurogenic bladder   . Neuromuscular disorder ([place])    carpal tunnel problem, even after surgery release   . Peripheral arterial disease ([place])   . Primary aldosteronism ([place])   . Stroke  [facility])    in [place], L sided weakness, wears a brace on L leg & uses cane    Patient Active Problem List   Diagnosis Date Noted  . Mild intermittent asthma 06/18/2016  . Wheezing 06/18/2016  . Cough 06/18/2016  . Left flank pain 04/18/2015  . Benign pheochromocytoma of right adrenal gland 06/23/2014  . PAD (peripheral artery disease) ([place]) 03/31/2013  . HTN (hypertension) 03/31/2013  . History of stroke 03/31/2013  . CAD (coronary artery disease) 03/31/2013  . Renal artery stenosis ([place]) 03/31/2013  . Erectile dysfunction 03/31/2013  . Dyslipidemia 03/31/2013  . Carotid stenosis 03/31/2013  . Left inguinal hernia 09/22/2012    Past Surgical History:  Procedure Laterality Date  . CARPAL TUNNEL RELEASE     bilateral   . CERVICAL FUSION    . CORONARY ANGIOPLASTY WITH STENT PLACEMENT     pt reports having 5 stents in his heart-2011  . HERNIA REPAIR    . INGUINAL HERNIA REPAIR Left 11/18/2012   Procedure: HERNIA REPAIR INGUINAL ADULT;  Surgeon: Harl Bowie, MD;  Location: [facility];  Service: General;  Laterality: Left;  . INSERTION OF MESH Left 11/18/2012   Procedure: INSERTION OF MESH;  Surgeon: Harl Bowie, MD;  Location: [place];  Service: General;  Laterality: Left;       Home Medications    Prior to Admission medications  Medication Sig Start Date End Date Taking? Authorizing Provider  benzonatate (TESSALON) 100 MG capsule Take 1 capsule (100 mg total) by mouth 3 (three) times daily as needed for cough. 06/18/16   Dorena Dew, FNP  cephALEXin (KEFLEX) 500 MG capsule Take 1 capsule (500 mg total) by mouth 4 (four) times daily. 06/16/16   Micheline Chapman, NP  clopidogrel (PLAVIX) 75 MG tablet Take 75 mg by mouth daily after breakfast.     Historical Provider, MD  FLOVENT HFA 110 MCG/ACT inhaler Inhale 1 puff into the lungs 3 (three) times daily.  04/13/14   Historical Provider, MD  fluticasone (FLONASE) 50 MCG/ACT nasal spray Place 2 sprays into both nostrils  daily. 10/02/14   Historical Provider, MD  guaiFENesin-codeine (ROBITUSSIN AC) 100-10 MG/5ML syrup Take 5 mLs by mouth 3 (three) times daily as needed for cough. Patient not taking: Reported on 06/24/2016 06/12/16   Micheline Chapman, NP  HYDROcodone-acetaminophen (NORCO/VICODIN) 5-325 MG tablet Take 1-2 tablets by mouth every 4 (four) hours as needed for severe pain. 07/10/16   Duffy Bruce, MD  labetalol (NORMODYNE) 300 MG tablet Take 300 mg by mouth 2 (two) times daily.     Historical Provider, MD  losartan-hydrochlorothiazide (HYZAAR) 100-25 MG tablet Take 1 tablet by mouth daily. 06/24/16   Dorena Dew, FNP  meloxicam (MOBIC) 7.5 MG tablet Take 1 tablet (7.5 mg total) by mouth as needed for pain. 07/10/16   Duffy Bruce, MD  Methylsulfonylmethane (MSM) 1000 MG TABS Take 500 mg by mouth daily.    Historical Provider, MD  Misc Natural Products (COLON CARE PO) Take 2 capsules by mouth every evening.     Historical Provider, MD  naproxen (NAPROSYN) 500 MG tablet Take 500 mg by mouth 2 (two) times daily with a meal.    Historical Provider, MD  NIFEdipine (PROCARDIA-XL/ADALAT CC) 30 MG 24 hr tablet (INS ALLOWS #180) TAKE 2 TABLETS BY MOUTH EACH MORNING AND 1 TABLET EACH EVENING 07/03/16   Mihai Croitoru, MD  OnabotulinumtoxinA (BOTOX IJ) Inject as directed. Every 90 days to fingers and left shoulder    Historical Provider, MD  pyridOXINE (VITAMIN B-6) 100 MG tablet Take 100 mg by mouth daily.    Historical Provider, MD  sennosides-docusate sodium (SENOKOT-S) 8.6-50 MG tablet Take 2 tablets by mouth every evening.     Historical Provider, MD  simvastatin (ZOCOR) 40 MG tablet Take 1 tablet by mouth daily. 06/17/14   Historical Provider, MD  spironolactone (ALDACTONE) 25 MG tablet Take 25 mg by mouth daily. 09/14/15   Historical Provider, MD  sulfamethoxazole-trimethoprim (BACTRIM DS,SEPTRA DS) 800-160 MG tablet Take 1 tablet by mouth 2 (two) times daily. Patient not taking: Reported on 06/24/2016  06/12/16   Micheline Chapman, NP  Tamsulosin HCl (FLOMAX) 0.4 MG CAPS Take 0.4 mg by mouth daily.      Historical Provider, MD  tiZANidine (ZANAFLEX) 4 MG tablet Take 1-2 tablets (4-8 mg total) by mouth at bedtime as needed. For muscle spasms 06/02/16   Micheline Chapman, NP  triamcinolone cream (KENALOG) 0.1 % 2 (two) times daily. 03/08/15   Historical Provider, MD  VENTOLIN HFA 108 (90 Base) MCG/ACT inhaler Inhale 1 puff into the lungs every 6 (six) hours as needed. 06/18/16   Dorena Dew, FNP    Family History Family History  Problem Relation Age of Onset  . CVA Father   . Diabetes Father   . Alzheimer's disease Father   . Hypertension Sister   .  Hypertension Brother   . CVA Paternal Grandmother   . Hypertension Brother   . Hypertension Mother     Social History Social History  Substance Use Topics  . Smoking status: Never Smoker  . Smokeless tobacco: Never Used  . Alcohol use Yes     Comment: social - one drink, on special occasion      Allergies   Elavil [amitriptyline hcl] and Sulfa antibiotics   Review of Systems Review of Systems  Constitutional: Positive for fatigue. Negative for fever.  HENT: Negative for congestion, rhinorrhea and sinus pressure.   Eyes: Negative for visual disturbance.  Respiratory: Positive for cough. Negative for wheezing.   Cardiovascular: Negative for chest pain and leg swelling.  Gastrointestinal: Negative for abdominal pain, diarrhea, nausea and vomiting.  Genitourinary: Negative for dysuria and flank pain.  Musculoskeletal: Positive for neck pain and neck stiffness.  Skin: Negative for rash.  Allergic/Immunologic: Negative for immunocompromised state.  Neurological: Positive for headaches. Negative for weakness.     Physical Exam Updated Vital Signs BP 149/68 (BP Location: Right Arm)   Pulse 68   Temp 98 F (36.7 C) (Oral)   Resp 15   SpO2 98%   Physical Exam  Constitutional: He is oriented to person, place, and time.  He appears well-developed and well-nourished. No distress.  HENT:  Head: Normocephalic and atraumatic.  Mouth/Throat: Oropharynx is clear and moist. No oropharyngeal exudate.  Eyes: Conjunctivae are normal. Pupils are equal, round, and reactive to light.  Neck: Neck supple.  Moderate tenderness to palpation over bilateral paraspinal muscles. No midline deformity.  Cardiovascular: Normal rate, regular rhythm and normal heart sounds.  Exam reveals no friction rub.   No murmur heard. Pulmonary/Chest: Effort normal and breath sounds normal. No respiratory distress. He has no wheezes. He has no rales.  Abdominal: Soft. Bowel sounds are normal. He exhibits no distension. There is no tenderness. There is no guarding.  Musculoskeletal: He exhibits no edema.  Neurological: He is alert and oriented to person, place, and time. He exhibits normal muscle tone.  Skin: Skin is warm. Capillary refill takes less than 2 seconds.  Psychiatric: He has a normal mood and affect.  Nursing note and vitals reviewed.   Neurological Exam:  Mental Status: Alert and oriented to person, place, and time. Attention and concentration normal. Speech clear. Recent memory is intact. Cranial Nerves: Visual fields intact to confrontation in all quadrants bilaterally. EOMI and PERRLA. No nystagmus noted. Facial sensation intact at forehead, maxillary cheek, and chin/mandible bilaterally. No weakness of masticatory muscles. No facial asymmetry or weakness. Hearing grossly normal to finger rub. Uvula is midline, and palate elevates symmetrically. Normal SCM and trapezius strength. Tongue midline without fasciculations Motor: Muscle strength 5/5 in proximal and distal RUE and RLE bilaterally. Increased spasticity LUE and LLE with increased tone, clonus. Reflexes: 2+ RUE and RLE. Sensation: Intact to light touch in upper and lower extremities distally bilaterally.  Gait: Normal Coordination: Normal FTN on right     ED Treatments  / Results  Labs (all labs ordered are listed, but only abnormal results are displayed) Labs Reviewed  CBC WITH DIFFERENTIAL/PLATELET - Abnormal; Notable for the following:       Result Value   RBC 4.14 (*)    All other components within normal limits  COMPREHENSIVE METABOLIC PANEL - Abnormal; Notable for the following:    Sodium 132 (*)    Chloride 100 (*)    Glucose, Bld 102 (*)    BUN 21 (*)  All other components within normal limits  URINALYSIS, ROUTINE W REFLEX MICROSCOPIC - Abnormal; Notable for the following:    Color, Urine STRAW (*)    All other components within normal limits    EKG  EKG Interpretation  Date/Time:  Thursday July 10 2016 21:15:26 EST Ventricular Rate:  71 PR Interval:  158 QRS Duration: 100 QT Interval:  404 QTC Calculation: 439 R Axis:   -35 Text Interpretation:  Normal sinus rhythm with sinus arrhythmia Left axis deviation Abnormal ECG No significant change since last tracing Confirmed by Ivyanna Sibert MD, Lysbeth Galas [phone]) on 07/11/2016 12:29:10 PM       Radiology Dg Chest 2 View  Result Date: 07/10/2016 CLINICAL DATA:  Cough, headache starting this morning,, recent treatment for bronchitis EXAM: CHEST  2 VIEW COMPARISON:  06/18/2016 FINDINGS: Cardiomediastinal silhouette is stable. No infiltrate or pleural effusion. No pulmonary edema. Stable mild degenerative changes left shoulder. Mild degenerative changes thoracic spine. IMPRESSION: No active cardiopulmonary disease. Electronically Signed   By: Lahoma Crocker M.D.   On: 07/10/2016 20:03   Ct Head Wo Contrast  Result Date: 07/10/2016 CLINICAL DATA:  Headache and neck pain, onset this morning. History of cervical fusion. EXAM: CT HEAD WITHOUT CONTRAST CT CERVICAL SPINE WITHOUT CONTRAST TECHNIQUE: Multidetector CT imaging of the head and cervical spine was performed following the standard protocol without intravenous contrast. Multiplanar CT image reconstructions of the cervical spine were also  generated. COMPARISON:  Head CT from 09/09/2015 FINDINGS: CT HEAD FINDINGS Brain: No acute intracranial hemorrhage, midline shift or edema. Mild superficial and mild-to-moderate central atrophy is again noted. Chronic infarct of the right lentiform nucleus extending into the external capsule on involving portions of the internal capsule remain unchanged in appearance. Slight ex vacuo dilatation of the body of the right lateral ventricle as a result. No intra-axial mass nor extra-axial fluid collections. Mild asymmetry of the infratentorial CSF as before may be related to mild volume loss or chronic subdural. Vascular: Moderate atherosclerosis of the carotid siphons bilaterally. No hyperdense vessels. Skull: No acute osseous abnormality. Sinuses/Orbits: Right maxillary sinus mucosal thickening. Minimal fluid in the inferior mastoids bilaterally. Orbits appear symmetric and nonacute. Other: Non CT CERVICAL SPINE FINDINGS Alignment: Reversal cervical lordosis with apex at site of cervical fusion involving the C5-6 vertebrae. There is also mild levoconvex curvature of the cervical spine. No acute fracture noted in. Skull base and vertebrae: Craniocervical relationship is maintained. The atlantodental interval is intact. The facets are maintained with osteoarthritic joint space narrowing, sclerosis and hypertrophy noted more so on the right. Soft tissues and spinal canal: There is no prevertebral soft tissue swelling. No significant spinal stenosis. Disc levels: Cervical fusion at C5-6 with slight osteopenic appearing bony incorporation noted, nonspecific but may be due to the disc material used for fusion. No soft tissue component is seen associated with this. Slight disc space narrowing at C4-5 and to greater extent C6-7. Upper chest: Unremarkable lung apices. 7 mm hypodense right thyroid nodule. Other: None IMPRESSION: No acute intracranial abnormality. Chronic small vessel ischemic disease of periventricular white  matter. Chronic right lentiform infarct with some involvement of the external capsule and right internal capsule as before. Right maxillary acute sinusitis. No acute cervical spinal abnormality. Fusion at C5-6. Degenerative disc disease at C6-7. 7 mm hypodense right thyroid nodule, possibly a cyst. No further workup recommendations given size and patient age. Electronically Signed   By: Ashley Royalty M.D.   On: 07/10/2016 20:28   Ct Cervical Spine Wo Contrast  Result Date: 07/10/2016 CLINICAL DATA:  Headache and neck pain, onset this morning. History of cervical fusion. EXAM: CT HEAD WITHOUT CONTRAST CT CERVICAL SPINE WITHOUT CONTRAST TECHNIQUE: Multidetector CT imaging of the head and cervical spine was performed following the standard protocol without intravenous contrast. Multiplanar CT image reconstructions of the cervical spine were also generated. COMPARISON:  Head CT from 09/09/2015 FINDINGS: CT HEAD FINDINGS Brain: No acute intracranial hemorrhage, midline shift or edema. Mild superficial and mild-to-moderate central atrophy is again noted. Chronic infarct of the right lentiform nucleus extending into the external capsule on involving portions of the internal capsule remain unchanged in appearance. Slight ex vacuo dilatation of the body of the right lateral ventricle as a result. No intra-axial mass nor extra-axial fluid collections. Mild asymmetry of the infratentorial CSF as before may be related to mild volume loss or chronic subdural. Vascular: Moderate atherosclerosis of the carotid siphons bilaterally. No hyperdense vessels. Skull: No acute osseous abnormality. Sinuses/Orbits: Right maxillary sinus mucosal thickening. Minimal fluid in the inferior mastoids bilaterally. Orbits appear symmetric and nonacute. Other: Non CT CERVICAL SPINE FINDINGS Alignment: Reversal cervical lordosis with apex at site of cervical fusion involving the C5-6 vertebrae. There is also mild levoconvex curvature of the  cervical spine. No acute fracture noted in. Skull base and vertebrae: Craniocervical relationship is maintained. The atlantodental interval is intact. The facets are maintained with osteoarthritic joint space narrowing, sclerosis and hypertrophy noted more so on the right. Soft tissues and spinal canal: There is no prevertebral soft tissue swelling. No significant spinal stenosis. Disc levels: Cervical fusion at C5-6 with slight osteopenic appearing bony incorporation noted, nonspecific but may be due to the disc material used for fusion. No soft tissue component is seen associated with this. Slight disc space narrowing at C4-5 and to greater extent C6-7. Upper chest: Unremarkable lung apices. 7 mm hypodense right thyroid nodule. Other: None IMPRESSION: No acute intracranial abnormality. Chronic small vessel ischemic disease of periventricular white matter. Chronic right lentiform infarct with some involvement of the external capsule and right internal capsule as before. Right maxillary acute sinusitis. No acute cervical spinal abnormality. Fusion at C5-6. Degenerative disc disease at C6-7. 7 mm hypodense right thyroid nodule, possibly a cyst. No further workup recommendations given size and patient age. Electronically Signed   By: Ashley Royalty M.D.   On: 07/10/2016 20:28    Procedures Procedures (including critical care time)  Medications Ordered in ED Medications  metoCLOPramide (REGLAN) injection 10 mg (10 mg Intravenous Given 07/10/16 2056)  diphenhydrAMINE (BENADRYL) injection 25 mg (25 mg Intravenous Given 07/10/16 2055)  dexamethasone (DECADRON) injection 10 mg (10 mg Intravenous Given 07/10/16 2125)     Initial Impression / Assessment and Plan / ED Course  I have reviewed the triage vital signs and the nursing notes.  Pertinent labs & imaging results that were available during my care of the patient were reviewed by me and considered in my medical decision making (see chart for  details).  Clinical Course     70 yo M with extensive PMHx as above including chronic neck/shoulder pain 2/2 spasticity from CVA here with posterior HA and left-sided neck pain. No new focal neurological deficits on exam. Pt does have significant TTP over left paraspinal cervical muscles. Primary suspicion at this time is likely occipital HA and left-sided neck pain 2/2 known radiculopathy, likely exacerbated in setting of recent bronchitis/coughing. Pt is o/w very well appearing. Lab work obtained and is at baseline with no significant new abnormalities.  UA without UTI. CT Head/C-Spine show no acute abnormalities. Do not suspect meningitis, SAH, or new intracranial mass/lesion. No signs of new CVA. Sx improving in ED.  Will treat with brief course of analgesics, low dose mobic 2/2 baseline CKD, and refer for outpt f/u. Return precautions given.  Final Clinical Impressions(s) / ED Diagnoses   Final diagnoses:  Acute nonintractable headache, unspecified headache type  Neck pain  Cough  Spasticity    New Prescriptions Discharge Medication List as of 07/10/2016  9:23 PM    START taking these medications   Details  HYDROcodone-acetaminophen (NORCO/VICODIN) 5-325 MG tablet Take 1-2 tablets by mouth every 4 (four) hours as needed for severe pain., Starting Thu 07/10/2016, Print         Duffy Bruce, MD 07/11/16 1229

## 2016-07-16 ENCOUNTER — Other Ambulatory Visit: Payer: Self-pay | Admitting: Family Medicine

## 2016-08-07 ENCOUNTER — Other Ambulatory Visit: Payer: Self-pay | Admitting: Family Medicine

## 2016-08-14 ENCOUNTER — Other Ambulatory Visit: Payer: Self-pay | Admitting: Family Medicine

## 2016-08-14 DIAGNOSIS — I10 Essential (primary) hypertension: Secondary | ICD-10-CM

## 2016-08-19 ENCOUNTER — Encounter: Payer: Self-pay | Admitting: Family Medicine

## 2016-08-19 ENCOUNTER — Ambulatory Visit (INDEPENDENT_AMBULATORY_CARE_PROVIDER_SITE_OTHER): Payer: Medicare HMO | Admitting: Family Medicine

## 2016-08-19 VITALS — BP 134/66 | HR 60 | Temp 98.0°F | Resp 16 | Ht 67.0 in | Wt 163.0 lb

## 2016-08-19 DIAGNOSIS — L259 Unspecified contact dermatitis, unspecified cause: Secondary | ICD-10-CM | POA: Diagnosis not present

## 2016-08-19 DIAGNOSIS — I10 Essential (primary) hypertension: Secondary | ICD-10-CM | POA: Diagnosis not present

## 2016-08-19 DIAGNOSIS — I2583 Coronary atherosclerosis due to lipid rich plaque: Secondary | ICD-10-CM

## 2016-08-19 DIAGNOSIS — G8929 Other chronic pain: Secondary | ICD-10-CM | POA: Diagnosis not present

## 2016-08-19 DIAGNOSIS — Z8673 Personal history of transient ischemic attack (TIA), and cerebral infarction without residual deficits: Secondary | ICD-10-CM

## 2016-08-19 DIAGNOSIS — I2510 Atherosclerotic heart disease of native coronary artery without angina pectoris: Secondary | ICD-10-CM

## 2016-08-19 DIAGNOSIS — Z1211 Encounter for screening for malignant neoplasm of colon: Secondary | ICD-10-CM | POA: Diagnosis not present

## 2016-08-19 DIAGNOSIS — M546 Pain in thoracic spine: Secondary | ICD-10-CM

## 2016-08-19 LAB — BASIC METABOLIC PANEL
BUN: 17 mg/dL (ref 7–25)
CHLORIDE: 102 mmol/L (ref 98–110)
CO2: 26 mmol/L (ref 20–31)
CREATININE: 1.11 mg/dL (ref 0.70–1.18)
Calcium: 8.9 mg/dL (ref 8.6–10.3)
Glucose, Bld: 101 mg/dL — ABNORMAL HIGH (ref 65–99)
POTASSIUM: 4.4 mmol/L (ref 3.5–5.3)
Sodium: 136 mmol/L (ref 135–146)

## 2016-08-19 MED ORDER — CLOPIDOGREL BISULFATE 75 MG PO TABS: ORAL_TABLET | ORAL | 5 refills | Status: DC

## 2016-08-19 MED ORDER — TRIAMCINOLONE ACETONIDE 0.1 % EX CREA: TOPICAL_CREAM | Freq: Two times a day (BID) | CUTANEOUS | 2 refills | Status: DC

## 2016-08-19 MED ORDER — TIZANIDINE HCL 4 MG PO TABS: 4.0000 mg | ORAL_TABLET | Freq: Every evening | ORAL | 0 refills | Status: DC | PRN

## 2016-08-19 MED ORDER — LOSARTAN POTASSIUM-HCTZ 100-25 MG PO TABS: ORAL_TABLET | ORAL | 1 refills | Status: DC

## 2016-08-19 MED ORDER — LABETALOL HCL 300 MG PO TABS: 300.0000 mg | ORAL_TABLET | Freq: Two times a day (BID) | ORAL | 1 refills | Status: DC

## 2016-08-19 NOTE — Progress Notes (Signed)
Subjective:    Patient ID: Howard Jackson, male    DOB: 1946-02-07, 71 y.o.   MRN: [id number]  Hypertension  This is a chronic problem. The current episode started more than 1 year ago. The problem is controlled. Pertinent negatives include no anxiety, blurred vision, headaches, malaise/fatigue, neck pain, orthopnea, palpitations, peripheral edema, PND or shortness of breath. There are no associated agents to hypertension. Risk factors for coronary artery disease include dyslipidemia, male gender and sedentary lifestyle. Past treatments include beta blockers, angiotensin blockers and lifestyle changes. There are no compliance problems.  There is no history of angina, kidney disease, CVA, heart failure, left ventricular hypertrophy, PVD, renovascular disease or retinopathy. There is no history of chronic renal disease, coarctation of the aorta, hyperaldosteronism, hypercortisolism, hyperparathyroidism, a hypertension causing med, pheochromocytoma, sleep apnea or a thyroid problem.  Back Pain  This is a chronic (Patient was evaluated in the emergency department on 07/10/2017 for chronic back pain) problem. The current episode started more than 1 year ago. The pain is present in the lumbar spine. The quality of the pain is described as aching. The pain is at a severity of 8/10. The pain is moderate. The pain is the same all the time. The symptoms are aggravated by position, standing, twisting and sitting. Stiffness is present all day. Associated symptoms include abdominal pain and weakness. Pertinent negatives include no bladder incontinence, bowel incontinence, headaches, leg pain, numbness, paresthesias or weight loss. (Patient has a history of a CVA with left sided weakness. ) He has tried analgesics for the symptoms. The treatment provided mild relief.   Past Medical History:  Diagnosis Date  . Adrenal gland disorder ([place])    tumor present - no change-  no recent increase    . Arthritis   . Benign  pheochromocytoma of right adrenal gland 06/23/2014   Never had histological diagnosis, but reportedly initially diagnosed in 1989  . Bunion    left foot  . Complication of anesthesia    "died on the table during cervical fusion" '89 - believed to be d/t "too much medication" to treat HTN and was later dx with pheochromocytoma  . Coronary artery disease    cleared for surg. by Dr. Gwenlyn Found - [facility]  . H/O hiatal hernia   . Hammer toe   . Herniated lumbar intervertebral disc   . History of anxiety   . Hyperlipidemia   . Hypertension   . Neurogenic bladder   . Neuromuscular disorder ([place])    carpal tunnel problem, even after surgery release   . Peripheral arterial disease ([place])   . Primary aldosteronism ([place])   . Stroke [facility])    in [place], L sided weakness, wears a brace on L leg & uses cane   Social History   Social History  . Marital status: Legally Separated    Spouse name: N/A  . Number of children: N/A  . Years of education: N/A   Occupational History  . Not on file.   Social History Main Topics  . Smoking status: Never Smoker  . Smokeless tobacco: Never Used  . Alcohol use Yes     Comment: social - one drink, on special occasion   . Drug use: No  . Sexual activity: Not on file   Other Topics Concern  . Not on file   Social History Narrative  . No narrative on file   Immunization History  Administered Date(s) Administered  . Influenza,inj,Quad PF,36+ Mos 05/22/2016   Review  of Systems  Constitutional: Negative for malaise/fatigue and weight loss.  HENT: Negative for sneezing, sore throat and trouble swallowing.   Eyes: Negative.  Negative for blurred vision.  Respiratory: Negative for shortness of breath.   Cardiovascular: Negative for palpitations, orthopnea and PND.  Gastrointestinal: Positive for abdominal pain. Negative for bowel incontinence.  Endocrine: Negative.  Negative for polydipsia, polyphagia and polyuria.  Genitourinary: Negative for bladder  incontinence.  Musculoskeletal: Positive for back pain. Negative for neck pain.       Right hip pain  Skin: Negative.   Allergic/Immunologic: Negative.   Neurological: Positive for weakness. Negative for numbness, headaches and paresthesias.  Hematological: Negative.   Psychiatric/Behavioral: Negative.        Objective:   Physical Exam  Constitutional: He is oriented to person, place, and time. He appears well-developed and well-nourished.  HENT:  Head: Normocephalic and atraumatic.  Right Ear: External ear normal.  Left Ear: External ear normal.  Nose: Nose normal.  Mouth/Throat: Oropharynx is clear and moist.  Eyes: Conjunctivae and EOM are normal. Pupils are equal, round, and reactive to light.  Neck: Normal range of motion. Neck supple.  Cardiovascular: Normal rate, regular rhythm, normal heart sounds and intact distal pulses.   Pulmonary/Chest: Effort normal. No respiratory distress. He has no decreased breath sounds. He has no rhonchi. He has no rales.  Abdominal: There is tenderness in the left upper quadrant.  Musculoskeletal: Normal range of motion.  Neurological: He is alert and oriented to person, place, and time. He has normal reflexes.  Skin: Skin is warm and dry.  Psychiatric: He has a normal mood and affect. His behavior is normal. Judgment and thought content normal.      BP 134/66 (BP Location: Right Arm, Patient Position: Sitting, Cuff Size: Normal)   Pulse 60   Temp 98 F (36.7 C) (Oral)   Resp 16   Ht 5\' 7"  (1.702 m)   Wt 163 lb (73.9 kg)   SpO2 100%   BMI 25.53 kg/m  Assessment & Plan:  1. Essential hypertension Blood pressure is at goal on current medication regimen.  - labetalol (NORMODYNE) 300 MG tablet; Take 1 tablet (300 mg total) by mouth 2 (two) times daily.  Dispense: 180 tablet; Refill: 1 - losartan-hydrochlorothiazide (HYZAAR) 100-25 MG tablet; TOME UNA TABLETA TODOS LOS DIAS  Dispense: 180 tablet; Refill: 1 - Basic Metabolic Panel  2.  Chronic bilateral thoracic back pain Reviewed spinal CT from 07/10/2016. I will forward notes to [facility] and defer for further treatment and evaluation.   3. History of stroke - clopidogrel (PLAVIX) 75 MG tablet; TOME UNA TABLETA TODOS LOS DIAS  Dispense: 30 tablet; Refill: 5 - tiZANidine (ZANAFLEX) 4 MG tablet; Take 1-2 tablets (4-8 mg total) by mouth at bedtime as needed. For muscle spasms  Dispense: 90 tablet; Refill: 0  4. Coronary artery disease due to lipid rich plaque - clopidogrel (PLAVIX) 75 MG tablet; TOME UNA TABLETA TODOS LOS DIAS  Dispense: 30 tablet; Refill: 5  5. Contact dermatitis, unspecified contact dermatitis type, unspecified trigger  - triamcinolone cream (KENALOG) 0.1 %; Apply topically 2 (two) times daily.  Dispense: 30 g; Refill: 2  6. Colon cancer screening  - Ambulatory referral to Gastroenterology    RTC: Follow up in 3 months for hypertension or as needed    Dorena Dew, FNP

## 2016-08-20 DIAGNOSIS — M546 Pain in thoracic spine: Secondary | ICD-10-CM

## 2016-08-20 DIAGNOSIS — G8929 Other chronic pain: Secondary | ICD-10-CM | POA: Insufficient documentation

## 2016-08-21 LAB — POCT URINALYSIS DIP (DEVICE)
BILIRUBIN URINE: NEGATIVE
GLUCOSE, UA: NEGATIVE mg/dL
Hgb urine dipstick: NEGATIVE
Ketones, ur: NEGATIVE mg/dL
Leukocytes, UA: NEGATIVE
Nitrite: NEGATIVE
PH: 5.5 (ref 5.0–8.0)
Protein, ur: NEGATIVE mg/dL
Specific Gravity, Urine: 1.025 (ref 1.005–1.030)
Urobilinogen, UA: 0.2 mg/dL (ref 0.0–1.0)

## 2016-08-22 ENCOUNTER — Ambulatory Visit: Payer: Medicare HMO | Admitting: Family Medicine

## 2016-08-25 ENCOUNTER — Other Ambulatory Visit: Payer: Self-pay

## 2016-08-25 DIAGNOSIS — L259 Unspecified contact dermatitis, unspecified cause: Secondary | ICD-10-CM

## 2016-08-25 MED ORDER — SIMVASTATIN 40 MG PO TABS: 40.0000 mg | ORAL_TABLET | Freq: Every evening | ORAL | 2 refills | Status: DC

## 2016-08-25 MED ORDER — TRIAMCINOLONE ACETONIDE 0.1 % EX CREA
TOPICAL_CREAM | Freq: Two times a day (BID) | CUTANEOUS | 2 refills | Status: DC
Start: 2016-08-25 — End: 2016-08-26

## 2016-08-25 MED ORDER — SPIRONOLACTONE 25 MG PO TABS: 25.0000 mg | ORAL_TABLET | Freq: Every day | ORAL | 2 refills | Status: DC

## 2016-08-26 ENCOUNTER — Other Ambulatory Visit: Payer: Self-pay | Admitting: Family Medicine

## 2016-08-26 DIAGNOSIS — L259 Unspecified contact dermatitis, unspecified cause: Secondary | ICD-10-CM

## 2016-08-26 MED ORDER — TRIAMCINOLONE ACETONIDE 0.1 % EX CREA: TOPICAL_CREAM | Freq: Two times a day (BID) | CUTANEOUS | 2 refills | Status: DC

## 2016-08-28 ENCOUNTER — Other Ambulatory Visit: Payer: Self-pay

## 2016-08-28 DIAGNOSIS — L259 Unspecified contact dermatitis, unspecified cause: Secondary | ICD-10-CM

## 2016-08-28 MED ORDER — TRIAMCINOLONE ACETONIDE 0.1 % EX CREA: TOPICAL_CREAM | Freq: Two times a day (BID) | CUTANEOUS | 2 refills | Status: DC

## 2016-08-28 NOTE — Telephone Encounter (Signed)
Sent in Triamcinolone cream to correct pharmacy. Thanks!

## 2016-09-15 ENCOUNTER — Telehealth: Payer: Self-pay

## 2016-09-15 DIAGNOSIS — I10 Essential (primary) hypertension: Secondary | ICD-10-CM

## 2016-09-15 NOTE — Telephone Encounter (Signed)
[place], Please advise. Thanks!  

## 2016-09-16 MED ORDER — LOSARTAN POTASSIUM-HCTZ 100-25 MG PO TABS
ORAL_TABLET | ORAL | 1 refills | Status: DC
Start: 2016-09-16 — End: 2018-04-20

## 2016-09-17 ENCOUNTER — Encounter: Payer: Self-pay | Admitting: Family Medicine

## 2016-11-15 ENCOUNTER — Other Ambulatory Visit: Payer: Self-pay | Admitting: Family Medicine

## 2016-11-15 DIAGNOSIS — Z8673 Personal history of transient ischemic attack (TIA), and cerebral infarction without residual deficits: Secondary | ICD-10-CM

## 2016-11-25 ENCOUNTER — Ambulatory Visit (INDEPENDENT_AMBULATORY_CARE_PROVIDER_SITE_OTHER): Payer: Medicare HMO | Admitting: Family Medicine

## 2016-11-25 ENCOUNTER — Encounter: Payer: Self-pay | Admitting: Family Medicine

## 2016-11-25 VITALS — BP 124/66 | HR 66 | Resp 16 | Ht 67.0 in | Wt 165.0 lb

## 2016-11-25 DIAGNOSIS — R059 Cough, unspecified: Secondary | ICD-10-CM

## 2016-11-25 DIAGNOSIS — R351 Nocturia: Secondary | ICD-10-CM

## 2016-11-25 DIAGNOSIS — I10 Essential (primary) hypertension: Secondary | ICD-10-CM

## 2016-11-25 DIAGNOSIS — N401 Enlarged prostate with lower urinary tract symptoms: Secondary | ICD-10-CM

## 2016-11-25 DIAGNOSIS — I701 Atherosclerosis of renal artery: Secondary | ICD-10-CM

## 2016-11-25 DIAGNOSIS — R05 Cough: Secondary | ICD-10-CM | POA: Diagnosis not present

## 2016-11-25 DIAGNOSIS — R062 Wheezing: Secondary | ICD-10-CM

## 2016-11-25 DIAGNOSIS — J4520 Mild intermittent asthma, uncomplicated: Secondary | ICD-10-CM | POA: Diagnosis not present

## 2016-11-25 DIAGNOSIS — N400 Enlarged prostate without lower urinary tract symptoms: Secondary | ICD-10-CM | POA: Insufficient documentation

## 2016-11-25 DIAGNOSIS — Z23 Encounter for immunization: Secondary | ICD-10-CM

## 2016-11-25 LAB — COMPLETE METABOLIC PANEL WITH GFR
ALBUMIN: 4.2 g/dL (ref 3.6–5.1)
ALT: 36 U/L (ref 9–46)
AST: 24 U/L (ref 10–35)
Alkaline Phosphatase: 53 U/L (ref 40–115)
BUN: 19 mg/dL (ref 7–25)
CO2: 21 mmol/L (ref 20–31)
CREATININE: 1.13 mg/dL (ref 0.70–1.18)
Calcium: 9.2 mg/dL (ref 8.6–10.3)
Chloride: 102 mmol/L (ref 98–110)
GFR, EST AFRICAN AMERICAN: 74 mL/min (ref >=60)
GFR, EST NON AFRICAN AMERICAN: 64 mL/min (ref >=60)
GLUCOSE: 101 mg/dL — AB (ref 65–99)
Potassium: 4.4 mmol/L (ref 3.5–5.3)
SODIUM: 134 mmol/L — AB (ref 135–146)
TOTAL PROTEIN: 6.5 g/dL (ref 6.1–8.1)
Total Bilirubin: 0.6 mg/dL (ref 0.2–1.2)

## 2016-11-25 LAB — PSA: PSA: 3.3 ng/mL (ref <=4.0)

## 2016-11-25 MED ORDER — VENTOLIN HFA 108 (90 BASE) MCG/ACT IN AERS: 2.0000 | INHALATION_SPRAY | RESPIRATORY_TRACT | 5 refills | Status: DC | PRN

## 2016-11-25 MED ORDER — ALBUTEROL SULFATE (2.5 MG/3ML) 0.083% IN NEBU
2.5000 mg | INHALATION_SOLUTION | Freq: Once | RESPIRATORY_TRACT | Status: AC
  Administered 2016-11-25: 2.5 mg via RESPIRATORY_TRACT

## 2016-11-25 MED ORDER — IPRATROPIUM BROMIDE 0.02 % IN SOLN
0.5000 mg | Freq: Once | RESPIRATORY_TRACT | Status: AC
Start: 2016-11-25 — End: 2016-11-25
  Administered 2016-11-25: 0.5 mg via RESPIRATORY_TRACT

## 2016-11-25 NOTE — Patient Instructions (Addendum)
Hypertension; Will continue medications as previously prescribed. Blood pressure is at goal.   Asthma:  Rescue inhaler for wheezing, persistent cough or shortness of breath. Albuterol 2 puffs every 4 hours as needed.  Singulair 10 mg daily    BPH and renal stenosis:  Scheduled appointment with Dr. Tresa Endo    DASH Eating Plan DASH stands for "Dietary Approaches to Stop Hypertension." The DASH eating plan is a healthy eating plan that has been shown to reduce high blood pressure (hypertension). It may also reduce your risk for type 2 diabetes, heart disease, and stroke. The DASH eating plan may also help with weight loss. What are tips for following this plan? General guidelines   Avoid eating more than 2,300 mg (milligrams) of salt (sodium) a day. If you have hypertension, you may need to reduce your sodium intake to 1,500 mg a day.  Limit alcohol intake to no more than 1 drink a day for nonpregnant women and 2 drinks a day for men. One drink equals 12 oz of beer, 5 oz of wine, or 1 oz of hard liquor.  Work with your health care provider to maintain a healthy body weight or to lose weight. Ask what an ideal weight is for you.  Get at least 30 minutes of exercise that causes your heart to beat faster (aerobic exercise) most days of the week. Activities may include walking, swimming, or biking.  Work with your health care provider or diet and nutrition specialist (dietitian) to adjust your eating plan to your individual calorie needs. Reading food labels   Check food labels for the amount of sodium per serving. Choose foods with less than 5 percent of the Daily Value of sodium. Generally, foods with less than 300 mg of sodium per serving fit into this eating plan.  To find whole grains, look for the word "whole" as the first word in the ingredient list. Shopping   Buy products labeled as "low-sodium" or "no salt added."  Buy fresh foods. Avoid canned foods and premade or frozen  meals. Cooking   Avoid adding salt when cooking. Use salt-free seasonings or herbs instead of table salt or sea salt. Check with your health care provider or pharmacist before using salt substitutes.  Do not fry foods. Cook foods using healthy methods such as baking, boiling, grilling, and broiling instead.  Cook with heart-healthy oils, such as olive, canola, soybean, or sunflower oil. Meal planning    Eat a balanced diet that includes:  5 or more servings of fruits and vegetables each day. At each meal, try to fill half of your plate with fruits and vegetables.  Up to 6-8 servings of whole grains each day.  Less than 6 oz of lean meat, poultry, or fish each day. A 3-oz serving of meat is about the same size as a deck of cards. One egg equals 1 oz.  2 servings of low-fat dairy each day.  A serving of nuts, seeds, or beans 5 times each week.  Heart-healthy fats. Healthy fats called Omega-3 fatty acids are found in foods such as flaxseeds and coldwater fish, like sardines, salmon, and mackerel.  Limit how much you eat of the following:  Canned or prepackaged foods.  Food that is high in trans fat, such as fried foods.  Food that is high in saturated fat, such as fatty meat.  Sweets, desserts, sugary drinks, and other foods with added sugar.  Full-fat dairy products.  Do not salt foods before eating.  Try to eat at least 2 vegetarian meals each week.  Eat more home-cooked food and less restaurant, buffet, and fast food.  When eating at a restaurant, ask that your food be prepared with less salt or no salt, if possible. What foods are recommended? The items listed may not be a complete list. Talk with your dietitian about what dietary choices are best for you. Grains  Whole-grain or whole-wheat bread. Whole-grain or whole-wheat pasta. Brown rice. [place]. Bulgur. Whole-grain and low-sodium cereals. Pita bread. Low-fat, low-sodium crackers. Whole-wheat flour  tortillas. Vegetables  Fresh or frozen vegetables (raw, steamed, roasted, or grilled). Low-sodium or reduced-sodium tomato and vegetable juice. Low-sodium or reduced-sodium tomato sauce and tomato paste. Low-sodium or reduced-sodium canned vegetables. Fruits  All fresh, dried, or frozen fruit. Canned fruit in natural juice (without added sugar). Meat and other protein foods  Skinless chicken or [place]. Ground chicken or [place]. Pork with fat trimmed off. Fish and seafood. Egg whites. Dried beans, peas, or lentils. Unsalted nuts, nut butters, and seeds. Unsalted canned beans. Lean cuts of beef with fat trimmed off. Low-sodium, lean deli meat. Dairy  Low-fat (1%) or fat-free (skim) milk. Fat-free, low-fat, or reduced-fat cheeses. Nonfat, low-sodium ricotta or cottage cheese. Low-fat or nonfat yogurt. Low-fat, low-sodium cheese. Fats and oils  Soft margarine without trans fats. Vegetable oil. Low-fat, reduced-fat, or light mayonnaise and salad dressings (reduced-sodium). Canola, safflower, olive, soybean, and sunflower oils. Avocado. Seasoning and other foods  Herbs. Spices. Seasoning mixes without salt. Unsalted popcorn and pretzels. Fat-free sweets. What foods are not recommended? The items listed may not be a complete list. Talk with your dietitian about what dietary choices are best for you. Grains  Baked goods made with fat, such as croissants, muffins, or some breads. Dry pasta or rice meal packs. Vegetables  Creamed or fried vegetables. Vegetables in a cheese sauce. Regular canned vegetables (not low-sodium or reduced-sodium). Regular canned tomato sauce and paste (not low-sodium or reduced-sodium). Regular tomato and vegetable juice (not low-sodium or reduced-sodium). Angie Fava. Olives. Fruits  Canned fruit in a light or heavy syrup. Fried fruit. Fruit in cream or butter sauce. Meat and other protein foods  Fatty cuts of meat. Ribs. Fried meat. Berniece Salines. Sausage. Bologna and other processed  lunch meats. Salami. Fatback. Hotdogs. Bratwurst. Salted nuts and seeds. Canned beans with added salt. Canned or smoked fish. Whole eggs or egg yolks. Chicken or [place] with skin. Dairy  Whole or 2% milk, cream, and half-and-half. Whole or full-fat cream cheese. Whole-fat or sweetened yogurt. Full-fat cheese. Nondairy creamers. Whipped toppings. Processed cheese and cheese spreads. Fats and oils  Butter. Stick margarine. Lard. Shortening. Ghee. Bacon fat. Tropical oils, such as coconut, palm kernel, or palm oil. Seasoning and other foods  Salted popcorn and pretzels. Onion salt, garlic salt, seasoned salt, table salt, and sea salt. Worcestershire sauce. Tartar sauce. Barbecue sauce. Teriyaki sauce. Soy sauce, including reduced-sodium. Steak sauce. Canned and packaged gravies. Fish sauce. Oyster sauce. Cocktail sauce. Horseradish that you find on the shelf. Ketchup. Mustard. Meat flavorings and tenderizers. Bouillon cubes. Hot sauce and Tabasco sauce. Premade or packaged marinades. Premade or packaged taco seasonings. Relishes. Regular salad dressings. Where to find more information:  National Heart, Lung, and [place]: https://[place]-eaton.com/  American Heart Association: www.heart.org Summary  The DASH eating plan is a healthy eating plan that has been shown to reduce high blood pressure (hypertension). It may also reduce your risk for type 2 diabetes, heart disease, and stroke.  With the DASH  eating plan, you should limit salt (sodium) intake to 2,300 mg a day. If you have hypertension, you may need to reduce your sodium intake to 1,500 mg a day.  When on the DASH eating plan, aim to eat more fresh fruits and vegetables, whole grains, lean proteins, low-fat dairy, and heart-healthy fats.  Work with your health care provider or diet and nutrition specialist (dietitian) to adjust your eating plan to your individual calorie needs. This information is not intended to replace advice given to you by  your health care provider. Make sure you discuss any questions you have with your health care provider. Document Released: 06/19/2011 Document Revised: 06/23/2016 Document Reviewed: 06/23/2016 Elsevier Interactive Patient Education  2017 [employer].

## 2016-11-25 NOTE — Progress Notes (Signed)
Subjective:    Patient ID: Howard Jackson, male    DOB: [birth year], 71 y.o.   MRN: [id number]  Howard Jackson, a 71 year old male presents for a follow up of chronic conditions.He has a history of CVA with residual right side weakness, PAD, asthma, hypertension, renal artery stenosis and mild intermittent asthma.  He is complaining of occasional wheezing. He denies recent hospitalizations or asthma exacerbations.    Hypertension  This is a chronic problem. The current episode started more than 1 year ago. The problem is controlled. Pertinent negatives include no anxiety, blurred vision, chest pain, headaches, malaise/fatigue, neck pain, orthopnea, palpitations, peripheral edema, PND, shortness of breath or sweats. There are no associated agents to hypertension. Risk factors for coronary artery disease include dyslipidemia, male gender and sedentary lifestyle. Past treatments include beta blockers, angiotensin blockers and lifestyle changes. There are no compliance problems.  Hypertensive end-organ damage includes CVA and PVD. There is no history of angina, kidney disease, heart failure, left ventricular hypertrophy or retinopathy. Identifiable causes of hypertension include chronic renal disease. There is no history of coarctation of the aorta, hyperaldosteronism, hypercortisolism, hyperparathyroidism, a hypertension causing med, pheochromocytoma, renovascular disease, sleep apnea or a thyroid problem.  Asthma  He complains of wheezing. There is no shortness of breath. This is a recurrent problem. The problem occurs intermittently. The problem has been waxing and waning. The cough is non-productive. Associated symptoms include dyspnea on exertion. Pertinent negatives include no chest pain, headaches, malaise/fatigue, PND, sneezing, sore throat, sweats or trouble swallowing. His symptoms are not alleviated by beta-agonist. His past medical history is significant for asthma.   Past Medical History:   Diagnosis Date  . Adrenal gland disorder ([place])    tumor present - no change-  no recent increase    . Arthritis   . Benign pheochromocytoma of right adrenal gland 06/23/2014   Never had histological diagnosis, but reportedly initially diagnosed in 1989  . Bunion    left foot  . Complication of anesthesia    "died on the table during cervical fusion" '89 - believed to be d/t "too much medication" to treat HTN and was later dx with pheochromocytoma  . Coronary artery disease    cleared for surg. by Dr. Gwenlyn Found - [facility]  . H/O hiatal hernia   . Hammer toe   . Herniated lumbar intervertebral disc   . History of anxiety   . Hyperlipidemia   . Hypertension   . Neurogenic bladder   . Neuromuscular disorder ([place])    carpal tunnel problem, even after surgery release   . Peripheral arterial disease ([place])   . Primary aldosteronism ([place])   . Stroke [facility])    in [place], L sided weakness, wears a brace on L leg & uses cane   Social History   Social History  . Marital status: Legally Separated    Spouse name: N/A  . Number of children: N/A  . Years of education: N/A   Occupational History  . Not on file.   Social History Main Topics  . Smoking status: Never Smoker  . Smokeless tobacco: Never Used  . Alcohol use Yes     Comment: social - one drink, on special occasion   . Drug use: No  . Sexual activity: Not on file   Other Topics Concern  . Not on file   Social History Narrative  . No narrative on file   Immunization History  Administered Date(s) Administered  . Influenza,inj,Quad  PF,36+ Mos 05/22/2016   Review of Systems  Constitutional: Negative for malaise/fatigue.  HENT: Negative.  Negative for sneezing, sore throat and trouble swallowing.   Eyes: Negative.  Negative for blurred vision, pain and redness.  Respiratory: Positive for wheezing. Negative for chest tightness and shortness of breath.   Cardiovascular: Positive for dyspnea on exertion. Negative for chest pain,  palpitations, orthopnea and PND.  Endocrine: Negative.  Negative for polydipsia, polyphagia and polyuria.  Genitourinary: Negative.  Negative for penile swelling, scrotal swelling and testicular pain.  Musculoskeletal: Negative for neck pain.       Right hip pain  Skin: Negative.   Allergic/Immunologic: Negative.  Negative for immunocompromised state.  Neurological: Positive for weakness and numbness (History of CVA, upper extremities). Negative for dizziness and headaches.  Hematological: Negative.   Psychiatric/Behavioral: Negative.        Objective:   Physical Exam  Constitutional: He is oriented to person, place, and time. He appears well-developed and well-nourished.  HENT:  Head: Normocephalic and atraumatic.  Right Ear: External ear normal.  Left Ear: External ear normal.  Nose: Nose normal.  Mouth/Throat: Oropharynx is clear and moist.  Eyes: Conjunctivae and EOM are normal. Pupils are equal, round, and reactive to light.  Neck: Normal range of motion. Neck supple.  Cardiovascular: Normal rate, regular rhythm, normal heart sounds and intact distal pulses.   Pulmonary/Chest: Effort normal. No respiratory distress. He has wheezes in the right upper field and the left upper field. He has no rhonchi. He has no rales.  Abdominal: There is tenderness in the left upper quadrant.  Musculoskeletal: Normal range of motion.  Neurological: He is alert and oriented to person, place, and time. He has normal reflexes. No cranial nerve deficit. Coordination and gait abnormal.  Skin: Skin is warm and dry.  Psychiatric: He has a normal mood and affect. His behavior is normal. Judgment and thought content normal.      BP 124/66 (BP Location: Right Arm, Patient Position: Sitting, Cuff Size: Normal)   Pulse 66   Resp 16   Ht 5\' 7"  (1.702 m)   Wt 165 lb (74.8 kg)   SpO2 98%   BMI 25.84 kg/m  Assessment & Plan:  1. Essential hypertension Blood pressure is at goal on current medication  regimen. Will continue at current dosages. The patient is asked to make an attempt to improve diet and exercise patterns to aid in medical management of this problem.  - COMPLETE METABOLIC PANEL WITH GFR  2. Mild intermittent asthma, unspecified whether complicated - VENTOLIN HFA 108 (90 Base) MCG/ACT inhaler; Inhale 2 puffs into the lungs every 4 (four) hours as needed for wheezing or shortness of breath.  Dispense: 1 Inhaler; Refill: 5 - albuterol (PROVENTIL) (2.5 MG/3ML) 0.083% nebulizer solution 2.5 mg; Take 3 mLs (2.5 mg total) by nebulization once. - ipratropium (ATROVENT) nebulizer solution 0.5 mg; Take 2.5 mLs (0.5 mg total) by nebulization once.  3. Wheezing Wheezing improved after duonebulizer treatment.  - VENTOLIN HFA 108 (90 Base) MCG/ACT inhaler; Inhale 2 puffs into the lungs every 4 (four) hours as needed for wheezing or shortness of breath.  Dispense: 1 Inhaler; Refill: 5 - albuterol (PROVENTIL) (2.5 MG/3ML) 0.083% nebulizer solution 2.5 mg; Take 3 mLs (2.5 mg total) by nebulization once. - ipratropium (ATROVENT) nebulizer solution 0.5 mg; Take 2.5 mLs (0.5 mg total) by nebulization once.  4. Need for Tdap vaccination - Tdap vaccine greater than or equal to 7yo IM  5. Benign prostatic hyperplasia  with nocturia - PSA  6. Immunization due - Pneumococcal conjugate vaccine 13-valent  7. Renal artery stenosis [facility]) Patient has a history of renal artery stenosis. Recommend that he follow up with Dr. Tresa Endo.  8. Cough - VENTOLIN HFA 108 (90 Base) MCG/ACT inhaler; Inhale 2 puffs into the lungs every 4 (four) hours as needed for wheezing or shortness of breath.  Dispense: 1 Inhaler; Refill: 5 - albuterol (PROVENTIL) (2.5 MG/3ML) 0.083% nebulizer solution 2.5 mg; Take 3 mLs (2.5 mg total) by nebulization once. - ipratropium (ATROVENT) nebulizer solution 0.5 mg; Take 2.5 mLs (0.5 mg total) by nebulization once.  RTC: 3 months for chronic conditions  [place]   MSN, FNP-C [place] [address] [place], [place] [address] [phone]

## 2016-11-26 LAB — POCT URINALYSIS DIP (DEVICE)
Bilirubin Urine: NEGATIVE
Glucose, UA: NEGATIVE mg/dL
Hgb urine dipstick: NEGATIVE
KETONES UR: NEGATIVE mg/dL
Leukocytes, UA: NEGATIVE
Nitrite: NEGATIVE
PH: 7 (ref 5.0–8.0)
PROTEIN: NEGATIVE mg/dL
Specific Gravity, Urine: 1.02 (ref 1.005–1.030)
Urobilinogen, UA: 0.2 mg/dL (ref 0.0–1.0)

## 2017-01-08 ENCOUNTER — Telehealth: Payer: Self-pay

## 2017-01-09 MED ORDER — TAMSULOSIN HCL 0.4 MG PO CAPS: 0.4000 mg | ORAL_CAPSULE | Freq: Every day | ORAL | 3 refills | Status: AC

## 2017-01-09 NOTE — Telephone Encounter (Signed)
This has been refilled and sent into pharmacy. Thanks!  

## 2017-01-13 DIAGNOSIS — G5601 Carpal tunnel syndrome, right upper limb: Secondary | ICD-10-CM | POA: Diagnosis not present

## 2017-01-13 DIAGNOSIS — N401 Enlarged prostate with lower urinary tract symptoms: Secondary | ICD-10-CM | POA: Diagnosis not present

## 2017-01-13 DIAGNOSIS — I639 Cerebral infarction, unspecified: Secondary | ICD-10-CM | POA: Diagnosis not present

## 2017-01-13 DIAGNOSIS — M7582 Other shoulder lesions, left shoulder: Secondary | ICD-10-CM | POA: Diagnosis not present

## 2017-01-13 DIAGNOSIS — E2609 Other primary hyperaldosteronism: Secondary | ICD-10-CM | POA: Diagnosis not present

## 2017-01-13 DIAGNOSIS — G8194 Hemiplegia, unspecified affecting left nondominant side: Secondary | ICD-10-CM | POA: Diagnosis not present

## 2017-01-13 DIAGNOSIS — I10 Essential (primary) hypertension: Secondary | ICD-10-CM | POA: Diagnosis not present

## 2017-01-16 ENCOUNTER — Other Ambulatory Visit: Payer: Self-pay | Admitting: Family Medicine

## 2017-01-16 DIAGNOSIS — Z8673 Personal history of transient ischemic attack (TIA), and cerebral infarction without residual deficits: Secondary | ICD-10-CM

## 2017-02-26 ENCOUNTER — Ambulatory Visit: Payer: Medicare HMO | Admitting: Family Medicine

## 2017-03-05 ENCOUNTER — Encounter: Payer: Self-pay | Admitting: Cardiovascular Disease

## 2017-03-05 ENCOUNTER — Ambulatory Visit (INDEPENDENT_AMBULATORY_CARE_PROVIDER_SITE_OTHER): Payer: Medicare HMO | Admitting: Cardiovascular Disease

## 2017-03-05 VITALS — BP 120/60 | Ht 67.0 in | Wt 162.0 lb

## 2017-03-05 DIAGNOSIS — I152 Hypertension secondary to endocrine disorders: Secondary | ICD-10-CM | POA: Diagnosis not present

## 2017-03-05 DIAGNOSIS — G8191 Hemiplegia, unspecified affecting right dominant side: Secondary | ICD-10-CM | POA: Diagnosis not present

## 2017-03-05 DIAGNOSIS — I2510 Atherosclerotic heart disease of native coronary artery without angina pectoris: Secondary | ICD-10-CM

## 2017-03-05 DIAGNOSIS — I739 Peripheral vascular disease, unspecified: Secondary | ICD-10-CM | POA: Diagnosis not present

## 2017-03-05 DIAGNOSIS — E7800 Pure hypercholesterolemia, unspecified: Secondary | ICD-10-CM

## 2017-03-05 DIAGNOSIS — N5201 Erectile dysfunction due to arterial insufficiency: Secondary | ICD-10-CM

## 2017-03-05 MED ORDER — SPIRONOLACTONE 25 MG PO TABS: 25.0000 mg | ORAL_TABLET | Freq: Every day | ORAL | 3 refills | Status: DC

## 2017-03-05 MED ORDER — NIFEDIPINE ER 30 MG PO TB24: ORAL_TABLET | ORAL | 3 refills | Status: DC

## 2017-03-05 MED ORDER — LABETALOL HCL 300 MG PO TABS: 300.0000 mg | ORAL_TABLET | Freq: Two times a day (BID) | ORAL | 3 refills | Status: DC

## 2017-03-05 MED ORDER — CLOPIDOGREL BISULFATE 75 MG PO TABS: 75.0000 mg | ORAL_TABLET | Freq: Every day | ORAL | 3 refills | Status: DC

## 2017-03-05 NOTE — Progress Notes (Signed)
Patient ID: Howard Jackson, male   DOB: 01-Apr-1946, 71 y.o.   MRN: [id number]      Cardiology Office Note   Date:  03/07/2017   ID:  IKTAN AIKMAN, DOB [birth year], MRN [id number]  PCP:  [facility] [place]  Cardiologist:   Sanda Klein, MD   Chief Complaint  Patient presents with  . Follow-up    pt has no complaints , wanted baclofen refill      History of Present Illness: Howard Jackson is a 71 y.o. male who presents for  Follow-up for severe systemic hypertension   Overall he is doing well from a cardiovascular point of view. He keeps searching for herbal therapies for erectile dysfunction. He asks about the safety of Morning Glory supplements. He has recently returned from the [place] where he celebrated his younger daughter's wedding.  His blood pressure control has generally been very good. He denies syncope, palpitations, dizziness, lightheadedness, leg edema, intermittent claudication or new focal neurological deficits. He has residual right spastic hemiparesis since his stroke. He has not had angina pectoris in a very long time.  He bears a diagnosis of pheochromocytoma initially identified in 1989. 2010 abdominal MRI shows a 6 mm right adrenal mass consistent with pheochromocytoma.    He has a history of extensive PAD and CAD:  - subtotal right popliteal occlusion with weak monophasic PT and Per flow and occluded AT. On the left all three calf vessels are occluded, with collateral reconstitution of the PT and Per. Right ABI 0.73, left 0.77 (Doppler Sept 2014). There was no significant pelvic or femoral disease.  - He had moderate left renal artery stenosis (50-60% angio 2011, <60% Duplex 2014), but his most recent duplex ultrasound from 2015 did not show evidence of renal artery stenosis on either side..  - Carotid US 11/2013 at [facility] showed no significant lesions. Residual right spastic hemiparesis after a remote left hemispheric  stroke. -  He has had 5 coronary angioplasty/stent  procedures to the RCA and LAD, most recently in April 2011 for LAD in-stent restenosis (when he received another drug-eluting stent)  In the past he had very prominent symptoms of orthostatic hypotension while taking clonidine. He was intolerant to treatment with PDE 5 inhibitors for erectile dysfunction due to nasal congestion and dyspepsia.   Past Medical History:  Diagnosis Date  . Adrenal gland disorder ([place])    tumor present - no change-  no recent increase    . Arthritis   . Benign pheochromocytoma of right adrenal gland 06/23/2014   Never had histological diagnosis, but reportedly initially diagnosed in 1989  . Bunion    left foot  . Complication of anesthesia    "died on the table during cervical fusion" '89 - believed to be d/t "too much medication" to treat HTN and was later dx with pheochromocytoma  . Coronary artery disease    cleared for surg. by Dr. Gwenlyn Found - [facility]  . H/O hiatal hernia   . Hammer toe   . Herniated lumbar intervertebral disc   . History of anxiety   . Hyperlipidemia   . Hypertension   . Neurogenic bladder   . Neuromuscular disorder ([place])    carpal tunnel problem, even after surgery release   . Peripheral arterial disease ([place])   . Primary aldosteronism ([place])   . Stroke ([place])    in [place], L sided weakness, wears a brace on L leg & uses cane  Past Surgical History:  Procedure Laterality Date  . CARPAL TUNNEL RELEASE     bilateral   . CERVICAL FUSION    . CORONARY ANGIOPLASTY WITH STENT PLACEMENT     pt reports having 5 stents in his heart-2011  . HERNIA REPAIR    . INGUINAL HERNIA REPAIR Left 11/18/2012   Procedure: HERNIA REPAIR INGUINAL ADULT;  Surgeon: Harl Bowie, MD;  Location: [facility];  Service: General;  Laterality: Left;  . INSERTION OF MESH Left 11/18/2012   Procedure: INSERTION OF MESH;  Surgeon: Harl Bowie, MD;  Location: [facility];  Service: General;  Laterality: Left;      Current Outpatient Prescriptions  Medication Sig Dispense Refill  . clopidogrel (PLAVIX) 75 MG tablet Take 1 tablet (75 mg total) by mouth daily. 90 tablet 3  . FLOVENT HFA 110 MCG/ACT inhaler Inhale 1 puff into the lungs 3 (three) times daily.   2  . labetalol (NORMODYNE) 300 MG tablet Take 1 tablet (300 mg total) by mouth 2 (two) times daily. 180 tablet 3  . losartan-hydrochlorothiazide (HYZAAR) 100-25 MG tablet TOME UNA TABLETA TODOS LOS DIAS 180 tablet 1  . meloxicam (MOBIC) 7.5 MG tablet Take 1 tablet (7.5 mg total) by mouth as needed for pain. 20 tablet 0  . Methylsulfonylmethane (MSM) 1000 MG TABS Take 500 mg by mouth daily.    . Misc Natural Products (COLON CARE PO) Take 2 capsules by mouth every evening.     Marland Kitchen NIFEdipine (PROCARDIA-XL/ADALAT CC) 30 MG 24 hr tablet Take 2 tablets (60 mg total) by mouth every morning and take 1 tablet (30 mg total) by mouth every evening. 270 tablet 3  . pyridOXINE (VITAMIN B-6) 100 MG tablet Take 100 mg by mouth daily.    . sennosides-docusate sodium (SENOKOT-S) 8.6-50 MG tablet Take 2 tablets by mouth every evening.     . simvastatin (ZOCOR) 40 MG tablet Take 1 tablet (40 mg total) by mouth every evening. 90 tablet 2  . spironolactone (ALDACTONE) 25 MG tablet Take 1 tablet (25 mg total) by mouth daily. 90 tablet 3  . tamsulosin (FLOMAX) 0.4 MG CAPS capsule Take 1 capsule (0.4 mg total) by mouth daily. 30 capsule 3  . tiZANidine (ZANAFLEX) 4 MG tablet TAKE 1-2 TABLETS (4-8 MG TOTAL) BY MOUTH AT BEDTIME AS NEEDED. FOR MUSCLE SPASMS 90 tablet 0  . triamcinolone cream (KENALOG) 0.1 % Apply topically 2 (two) times daily. 453.6 g 2  . VENTOLIN HFA 108 (90 Base) MCG/ACT inhaler Inhale 2 puffs into the lungs every 4 (four) hours as needed for wheezing or shortness of breath. 1 Inhaler 5   No current facility-administered medications for this visit.     Allergies:   Elavil [amitriptyline hcl] and Sulfa antibiotics    Social History:  The patient   reports that he has never smoked. He has never used smokeless tobacco. He reports that he drinks alcohol. He reports that he does not use drugs.   Family History:  The patient's family history includes Alzheimer's disease in his father; CVA in his father and paternal grandmother; Diabetes in his father; Hypertension in his brother, brother, mother, and sister.    ROS:  Please see the history of present illness.    Otherwise, review of systems positive for  Persistent spastic right hemiparesis.   All other systems are reviewed and negative.    PHYSICAL EXAM: VS:  BP 120/60   Ht 5\' 7"  (1.702 m)   Wt 162 lb (73.5  kg)   BMI 25.37 kg/m  , BMI Body mass index is 25.37 kg/m.   General: Alert, oriented x3, no distress Head: no evidence of trauma, PERRL, EOMI, no exophtalmos or lid lag, no myxedema, no xanthelasma; normal ears, nose and oropharynx Neck: normal jugular venous pulsations and no hepatojugular reflux; brisk carotid pulses without delay and no carotid bruits Chest: clear to auscultation, no signs of consolidation by percussion or palpation, normal fremitus, symmetrical and full respiratory excursions Cardiovascular: normal position and quality of the apical impulse, regular rhythm, normal first and second heart sounds, no murmurs, rubs or gallops Abdomen: no tenderness or distention, no masses by palpation, no abnormal pulsatility or arterial bruits, normal bowel sounds, no hepatosplenomegaly Extremities: no clubbing, cyanosis or edema; 2+ radial, ulnar and brachial pulses bilaterally; 2+ right femoral, unable to palpate posterior tibial and dorsalis pedis pulses; 2+ left femoral, unable to palpate posterior tibial and dorsalis pedis pulses;  Neurological: grossly nonfocal  Neurological:  Spastic right Hemiparesis Psych: euthymic mood, full affect   EKG:  EKG  ordered today. Shows normal sinus rhythm, left axis deviation, normal QTC 408 ms   Recent Labs: 07/10/2016: Hemoglobin  13.7; Platelets 195 11/25/2016: ALT 36; BUN 19; Creat 1.13; Potassium 4.4; Sodium 134    Lipid Panel    Component Value Date/Time   CHOL 136 05/22/2016 1435   TRIG 73 05/22/2016 1435   HDL 44 05/22/2016 1435   CHOLHDL 3.1 05/22/2016 1435   VLDL 15 05/22/2016 1435   LDLCALC 77 05/22/2016 1435      Wt Readings from Last 3 Encounters:  03/05/17 162 lb (73.5 kg)  11/25/16 165 lb (74.8 kg)  08/19/16 163 lb (73.9 kg)      ASSESSMENT AND PLAN:  1. HTN: Remarkably good control compared to past history. He bears a diagnosis of pheochromocytoma based on the presence of an adrenal mass diagnosed in 2010. He should not be given unopposed beta blockers. 2. PAD: He really does not have complaints of intermittent claudication, at least in part due to functional limitations from his previous stroke and sedentary lifestyle. He does not smoke.  3. CAD: Denies angina pectoris. He is not on any antianginal except for the nifedipine that is being used for his blood pressure. Long-standing chronic clopidogrel therapy for his history of stroke. Avoiding simultaneous treatment with aspirin since he takes nonsteroidal anti-inflammatory drugs. No GI bleeding. 4. HLP: Lipids are being followed at [facility]. I don't have this year his labs, but less than 12 months ago lipid profile was generally acceptable, with HDL close to target. He is on simvastatin. 5. ED: He did not have any stenoses in the abdominal aorta or the greater iliac vessels, cannot exclude internal iliac disease. He has not been able to tolerate PDE5 inhibitors. Judging from the literature that I can retrieve, no reason why he could not try J. Elmon Else. 6. R hemiparesis: Despite this he is able to travel and live independently.    Labs/ tests ordered today include:   Orders Placed This Encounter  Procedures  . EKG 12-Lead     Patient Instructions  Dr Sallyanne Kuster recommends that you schedule a follow-up appointment in 12  months. You will receive a reminder letter in the mail two months in advance. If you don't receive a letter, please call our office to schedule the follow-up appointment.  If you need a refill on your cardiac medications before your next appointment, please call your pharmacy.     Signed,  Sanda Klein, MD  03/07/2017 5:14 PM    Sanda Klein, MD, [facility] [phone] office [phone] pager

## 2017-03-05 NOTE — Patient Instructions (Signed)
Dr Croitoru recommends that you schedule a follow-up appointment in 12 months. You will receive a reminder letter in the mail two months in advance. If you don't receive a letter, please call our office to schedule the follow-up appointment.  If you need a refill on your cardiac medications before your next appointment, please call your pharmacy. 

## 2017-03-07 DIAGNOSIS — I152 Hypertension secondary to endocrine disorders: Secondary | ICD-10-CM | POA: Insufficient documentation

## 2017-03-16 ENCOUNTER — Other Ambulatory Visit: Payer: Self-pay | Admitting: Family Medicine

## 2017-03-16 DIAGNOSIS — I10 Essential (primary) hypertension: Secondary | ICD-10-CM

## 2017-03-17 ENCOUNTER — Other Ambulatory Visit: Payer: Self-pay | Admitting: Cardiovascular Disease

## 2017-03-17 NOTE — Telephone Encounter (Signed)
Rx(s) sent to pharmacy electronically.  

## 2017-03-18 ENCOUNTER — Other Ambulatory Visit: Payer: Self-pay | Admitting: Family Medicine

## 2017-03-18 DIAGNOSIS — I10 Essential (primary) hypertension: Secondary | ICD-10-CM

## 2017-05-11 ENCOUNTER — Other Ambulatory Visit: Payer: Self-pay | Admitting: Family Medicine

## 2017-05-13 ENCOUNTER — Other Ambulatory Visit: Payer: Self-pay

## 2017-05-13 MED ORDER — SPIRONOLACTONE 25 MG PO TABS: 25.0000 mg | ORAL_TABLET | Freq: Every day | ORAL | 3 refills | Status: DC

## 2017-05-28 ENCOUNTER — Ambulatory Visit: Payer: Medicare HMO | Admitting: Cardiovascular Disease

## 2017-06-12 ENCOUNTER — Telehealth: Payer: Self-pay | Admitting: Cardiovascular Disease

## 2017-06-12 NOTE — Telephone Encounter (Signed)
New message  Pt verbalized that he is calling for the rn   To go over his medication and to change his dosages for his insurance reasons

## 2017-06-12 NOTE — Telephone Encounter (Signed)
Left msg to call.

## 2017-06-15 MED ORDER — NIFEDIPINE ER 60 MG PO TB24: ORAL_TABLET | ORAL | 3 refills | Status: DC

## 2017-06-15 MED ORDER — NIFEDIPINE ER 30 MG PO TB24: ORAL_TABLET | ORAL | 3 refills | Status: DC

## 2017-06-15 NOTE — Telephone Encounter (Signed)
Pt states he has been trying to get in touch w [facility] regarding a rx refill request for Nifedipine  Insurance denying coverage for quantity limit for the 30mg  dose (prescribed #270 over 90 days, with instructions to take 2 tablets (60mg  total) in AM and 1 tablet (30mg ) in PM.  In order to have insurance cover, has a quantity limit of #180 per 90 days for this medication - pt was contacted by pharmacy w recommendations, he needs 60mg  tablet strength in AM, 30mg  tablet strength in PM  Sent in w this adjustment - communicated instruction w patient who verbalized understanding.

## 2017-06-16 ENCOUNTER — Encounter: Payer: Self-pay | Admitting: *Deleted

## 2017-06-16 NOTE — Telephone Encounter (Signed)
This encounter was created in error - please disregard.

## 2017-09-15 ENCOUNTER — Other Ambulatory Visit: Payer: Self-pay | Admitting: Orthopedic Surgery

## 2017-09-15 DIAGNOSIS — M25512 Pain in left shoulder: Secondary | ICD-10-CM

## 2017-09-30 ENCOUNTER — Ambulatory Visit
Admission: RE | Admit: 2017-09-30 | Discharge: 2017-09-30 | Disposition: A | Discharge status: Home / Self Care | Payer: Medicare HMO | Source: Ambulatory Visit | Attending: Orthopedic Surgery | Admitting: Orthopedic Surgery

## 2017-09-30 DIAGNOSIS — M25512 Pain in left shoulder: Secondary | ICD-10-CM

## 2017-10-02 ENCOUNTER — Emergency Department (HOSPITAL_COMMUNITY): Payer: Medicare HMO

## 2017-10-02 ENCOUNTER — Emergency Department (HOSPITAL_COMMUNITY)
Admission: EM | Admit: 2017-10-02 | Discharge: 2017-10-02 | Disposition: A | Discharge status: Home / Self Care | Payer: Medicare HMO | Attending: Emergency Medicine | Admitting: Emergency Medicine

## 2017-10-02 DIAGNOSIS — Z79899 Other long term (current) drug therapy: Secondary | ICD-10-CM | POA: Diagnosis not present

## 2017-10-02 DIAGNOSIS — R519 Headache, unspecified: Secondary | ICD-10-CM

## 2017-10-02 DIAGNOSIS — R51 Headache: Secondary | ICD-10-CM | POA: Diagnosis present

## 2017-10-02 DIAGNOSIS — I2510 Atherosclerotic heart disease of native coronary artery without angina pectoris: Secondary | ICD-10-CM | POA: Insufficient documentation

## 2017-10-02 LAB — CBC WITH DIFFERENTIAL/PLATELET
Basophils Absolute: 0 10*3/uL (ref 0.0–0.1)
Basophils Relative: 0 %
Eosinophils Absolute: 0.1 10*3/uL (ref 0.0–0.7)
Eosinophils Relative: 2 %
HCT: 43.8 % (ref 39.0–52.0)
Hemoglobin: 14.5 g/dL (ref 13.0–17.0)
Lymphocytes Relative: 26 %
Lymphs Abs: 1.5 10*3/uL (ref 0.7–4.0)
MCH: 32.7 pg (ref 26.0–34.0)
MCHC: 33.1 g/dL (ref 30.0–36.0)
MCV: 98.9 fL (ref 78.0–100.0)
Monocytes Absolute: 0.6 10*3/uL (ref 0.1–1.0)
Monocytes Relative: 10 %
Neutro Abs: 3.6 10*3/uL (ref 1.7–7.7)
Neutrophils Relative %: 62 %
Platelets: 193 10*3/uL (ref 150–400)
RBC: 4.43 MIL/uL (ref 4.22–5.81)
RDW: 13 % (ref 11.5–15.5)
WBC: 5.7 10*3/uL (ref 4.0–10.5)

## 2017-10-02 LAB — BASIC METABOLIC PANEL
Anion gap: 10 (ref 5–15)
BUN: 14 mg/dL (ref 6–20)
CO2: 23 mmol/L (ref 22–32)
Calcium: 8.9 mg/dL (ref 8.9–10.3)
Chloride: 100 mmol/L — ABNORMAL LOW (ref 101–111)
Creatinine, Ser: 1.13 mg/dL (ref 0.61–1.24)
GFR calc Af Amer: >60 mL/min (ref >60)
GFR calc non Af Amer: >60 mL/min (ref >60)
Glucose, Bld: 114 mg/dL — ABNORMAL HIGH (ref 65–99)
Potassium: 3.9 mmol/L (ref 3.5–5.1)
Sodium: 133 mmol/L — ABNORMAL LOW (ref 135–145)

## 2017-10-02 NOTE — ED Triage Notes (Signed)
Pt arrived via [facility] from [place] [place]in [facility] after being sent by provider. Pt was being seen by provider for a headache that occurred yesterday. Pt states he had a headache yesterday and took asa 324 which resolved the pain. Pt has hx of stroke with left residual sided contracture. Pt is alert and oriented x4.

## 2017-10-02 NOTE — Discharge Instructions (Addendum)
Return here as needed.  Follow-up with your doctor and neurologist.

## 2017-10-03 NOTE — ED Provider Notes (Signed)
[facility] EMERGENCY DEPARTMENT Provider Note   CSN: [id number] Arrival date & time: 10/02/17  1601     History   Chief Complaint Chief Complaint  Patient presents with  . Headache    HPI Howard Jackson is a 72 y.o. male.  HPI Patient presents to the emergency department with a headache that occurred yesterday and lasted for about 25 minutes.  The patient states he took aspirin which resolved the symptoms.  The patient was seen in urgent care for this headache that occurred yesterday and they sent him to the emergency department for neurological evaluation.  Patient is not having any other symptoms.  The patient denies chest pain, shortness of breath, headache,blurred vision, neck pain, fever, cough, weakness, numbness, dizziness, anorexia, edema, abdominal pain, nausea, vomiting, diarrhea, rash, back pain, dysuria, hematemesis, bloody stool, near syncope, or syncope. Past Medical History:  Diagnosis Date  . Adrenal gland disorder ([place])    tumor present - no change-  no recent increase    . Arthritis   . Benign pheochromocytoma of right adrenal gland 06/23/2014   Never had histological diagnosis, but reportedly initially diagnosed in 1989  . Bunion    left foot  . Complication of anesthesia    "died on the table during cervical fusion" '89 - believed to be d/t "too much medication" to treat HTN and was later dx with pheochromocytoma  . Coronary artery disease    cleared for surg. by Dr. Gwenlyn Found - [facility]  . H/O hiatal hernia   . Hammer toe   . Herniated lumbar intervertebral disc   . History of anxiety   . Hyperlipidemia   . Hypertension   . Neurogenic bladder   . Neuromuscular disorder ([place])    carpal tunnel problem, even after surgery release   . Peripheral arterial disease ([place])   . Primary aldosteronism ([place])   . Stroke [facility])    in [place], L sided weakness, wears a brace on L leg & uses cane    Patient Active Problem List   Diagnosis Date Noted    . Hypertension due to endocrine disorder 03/07/2017  . BPH (benign prostatic hyperplasia) 11/25/2016  . Chronic bilateral thoracic back pain 08/20/2016  . Mild intermittent asthma 06/18/2016  . Wheezing 06/18/2016  . Cough 06/18/2016  . Left flank pain 04/18/2015  . Benign pheochromocytoma of right adrenal gland 06/23/2014  . PAD (peripheral artery disease) ([place]) 03/31/2013  . HTN (hypertension) 03/31/2013  . History of stroke 03/31/2013  . CAD (coronary artery disease) 03/31/2013  . Renal artery stenosis ([place]) 03/31/2013  . Erectile dysfunction 03/31/2013  . Dyslipidemia 03/31/2013  . Carotid stenosis 03/31/2013  . Left inguinal hernia 09/22/2012    Past Surgical History:  Procedure Laterality Date  . CARPAL TUNNEL RELEASE     bilateral   . CERVICAL FUSION    . CORONARY ANGIOPLASTY WITH STENT PLACEMENT     pt reports having 5 stents in his heart-2011  . HERNIA REPAIR    . INGUINAL HERNIA REPAIR Left 11/18/2012   Procedure: HERNIA REPAIR INGUINAL ADULT;  Surgeon: Harl Bowie, MD;  Location: [facility];  Service: General;  Laterality: Left;  . INSERTION OF MESH Left 11/18/2012   Procedure: INSERTION OF MESH;  Surgeon: Harl Bowie, MD;  Location: [facility];  Service: General;  Laterality: Left;        Home Medications    Prior to Admission medications   Medication Sig Start Date End Date Taking?  Authorizing Provider  clopidogrel (PLAVIX) 75 MG tablet Take 1 tablet (75 mg total) by mouth daily. 03/05/17   Croitoru, Mihai, MD  FLOVENT HFA 110 MCG/ACT inhaler Inhale 1 puff into the lungs 3 (three) times daily.  04/13/14   [provider]  labetalol (NORMODYNE) 300 MG tablet Take 1 tablet (300 mg total) by mouth 2 (two) times daily. 03/05/17   Croitoru, Mihai, MD  losartan-hydrochlorothiazide (HYZAAR) 100-25 MG tablet TOME UNA TABLETA TODOS LOS DIAS 09/16/16   Dorena Dew, FNP  meloxicam (MOBIC) 7.5 MG tablet Take 1 tablet (7.5 mg total) by mouth as needed for pain.  07/10/16   Duffy Bruce, MD  Methylsulfonylmethane (MSM) 1000 MG TABS Take 500 mg by mouth daily.    [provider]  Misc Natural Products (COLON CARE PO) Take 2 capsules by mouth every evening.     [provider]  NIFEdipine (PROCARDIA-XL/ADALAT CC) 30 MG 24 hr tablet Take 1 tablet (30 mg) by mouth every evening. Use other prescription (60mg ) in morning only. 06/15/17   Croitoru, Mihai, MD  NIFEdipine (PROCARDIA-XL/ADALAT CC) 60 MG 24 hr tablet Take 1 tablet (60 mg) by mouth every morning. Use other prescription (30mg ) in evening only. 06/15/17   Croitoru, Mihai, MD  pyridOXINE (VITAMIN B-6) 100 MG tablet Take 100 mg by mouth daily.    [provider]  sennosides-docusate sodium (SENOKOT-S) 8.6-50 MG tablet Take 2 tablets by mouth every evening.     [provider]  simvastatin (ZOCOR) 40 MG tablet Take 1 tablet (40 mg total) by mouth every evening. 08/25/16   Dorena Dew, FNP  spironolactone (ALDACTONE) 25 MG tablet Take 1 tablet (25 mg total) by mouth daily. 05/13/17   Croitoru, Mihai, MD  tamsulosin (FLOMAX) 0.4 MG CAPS capsule Take 1 capsule (0.4 mg total) by mouth daily. 01/09/17   Dorena Dew, FNP  tiZANidine (ZANAFLEX) 4 MG tablet TAKE 1-2 TABLETS (4-8 MG TOTAL) BY MOUTH AT BEDTIME AS NEEDED. FOR MUSCLE SPASMS 11/17/16   Dorena Dew, FNP  triamcinolone cream (KENALOG) 0.1 % Apply topically 2 (two) times daily. 08/28/16   Dorena Dew, FNP  VENTOLIN HFA 108 (90 Base) MCG/ACT inhaler Inhale 2 puffs into the lungs every 4 (four) hours as needed for wheezing or shortness of breath. 11/25/16   Dorena Dew, FNP    Family History Family History  Problem Relation Age of Onset  . CVA Father   . Diabetes Father   . Alzheimer's disease Father   . Hypertension Sister   . Hypertension Brother   . CVA Paternal Grandmother   . Hypertension Brother   . Hypertension Mother     Social History Social History   Tobacco Use  . Smoking  status: Never Smoker  . Smokeless tobacco: Never Used  Substance Use Topics  . Alcohol use: Yes    Comment: social - one drink, on special occasion   . Drug use: No     Allergies   Elavil [amitriptyline hcl] and Sulfa antibiotics   Review of Systems Review of Systems  All other systems negative except as documented in the HPI. All pertinent positives and negatives as reviewed in the HPI. Physical Exam Updated Vital Signs BP 114/71 (BP Location: Right Arm)   Pulse 71   Temp 98.8 F (37.1 C) (Oral)   Resp 18   SpO2 100%   Physical Exam  Constitutional: He is oriented to person, place, and time. He appears well-developed and well-nourished. No  distress.  HENT:  Head: Normocephalic and atraumatic.  Mouth/Throat: Oropharynx is clear and moist.  Eyes: Pupils are equal, round, and reactive to light.  Neck: Normal range of motion. Neck supple.  Cardiovascular: Normal rate, regular rhythm and normal heart sounds. Exam reveals no gallop and no friction rub.  No murmur heard. Pulmonary/Chest: Effort normal and breath sounds normal. No respiratory distress. He has no wheezes.  Abdominal: Soft. Bowel sounds are normal. He exhibits no distension. There is no tenderness.  Neurological: He is alert and oriented to person, place, and time. He is not disoriented. No sensory deficit. He exhibits normal muscle tone. Coordination and gait normal.  Skin: Skin is warm and dry. Capillary refill takes less than 2 seconds. No rash noted. No erythema.  Psychiatric: He has a normal mood and affect. His behavior is normal.  Nursing note and vitals reviewed.    ED Treatments / Results  Labs (all labs ordered are listed, but only abnormal results are displayed) Labs Reviewed  BASIC METABOLIC PANEL - Abnormal; Notable for the following components:      Result Value   Sodium 133 (*)    Chloride 100 (*)    Glucose, Bld 114 (*)    All other components within normal limits  CBC WITH  DIFFERENTIAL/PLATELET    EKG None  Radiology Ct Head Wo Contrast  Result Date: 10/02/2017 CLINICAL DATA:  Headache since yesterday. History of stroke with left-sided contracture. EXAM: CT HEAD WITHOUT CONTRAST TECHNIQUE: Contiguous axial images were obtained from the base of the skull through the vertex without intravenous contrast. COMPARISON:  CT from 07/10/2016 and priors FINDINGS: Brain: No evidence of acute infarction, hemorrhage, hydrocephalus, extra-axial collection or mass lesion/mass effect. Volume loss secondary to chronic right lentiform nucleus infarct involving portions of the external and internal capsules. Adjacent slight ex vacuo dilatation of the body of the right lateral ventricle. Midline fourth ventricle and basal cisterns. Cerebellum and brainstem are unremarkable. Vascular: No hyperdense vessel sign. Moderate atherosclerosis of the carotid siphons bilaterally. Skull: No acute skull fracture.  No suspicious osseous lesions. Sinuses/Orbits: No acute finding. Other: None. IMPRESSION: Chronic infarct right lentiform nucleus with portions of the external and internal capsule as well. Associated mild ex vacuo dilatation of the adjacent right lateral ventricle. No acute intracranial abnormality. Electronically Signed   By: Ashley Royalty M.D.   On: 10/02/2017 18:50    Procedures Procedures (including critical care time)  Medications Ordered in ED Medications - No data to display   Initial Impression / Assessment and Plan / ED Course  I have reviewed the triage vital signs and the nursing notes.  Pertinent labs & imaging results that were available during my care of the patient were reviewed by me and considered in my medical decision making (see chart for details).     Patient had a negative head CT.  Patient is referred back to his neurologist and primary doctor.  He is advised to return here for any worsening in his condition patient agrees the plan and all questions were  answered  Final Clinical Impressions(s) / ED Diagnoses   Final diagnoses:  Acute nonintractable headache, unspecified headache type    ED Discharge Orders    None       Dalia Heading, PA-C 10/04/17 0015    Drenda Freeze, MD 10/06/17 1051

## 2017-12-23 ENCOUNTER — Other Ambulatory Visit: Payer: Self-pay | Admitting: Cardiovascular Disease

## 2017-12-23 DIAGNOSIS — I152 Hypertension secondary to endocrine disorders: Secondary | ICD-10-CM

## 2018-02-19 ENCOUNTER — Other Ambulatory Visit: Payer: Self-pay | Admitting: Internal Medicine

## 2018-02-19 DIAGNOSIS — R350 Frequency of micturition: Secondary | ICD-10-CM

## 2018-02-19 DIAGNOSIS — R972 Elevated prostate specific antigen [PSA]: Secondary | ICD-10-CM

## 2018-02-19 DIAGNOSIS — R634 Abnormal weight loss: Secondary | ICD-10-CM

## 2018-02-26 ENCOUNTER — Ambulatory Visit
Admission: RE | Admit: 2018-02-26 | Discharge: 2018-02-26 | Disposition: A | Discharge status: Home / Self Care | Payer: Medicare HMO | Source: Ambulatory Visit | Attending: Internal Medicine | Admitting: Internal Medicine

## 2018-02-26 DIAGNOSIS — R634 Abnormal weight loss: Secondary | ICD-10-CM

## 2018-02-26 DIAGNOSIS — R350 Frequency of micturition: Secondary | ICD-10-CM

## 2018-02-26 DIAGNOSIS — R972 Elevated prostate specific antigen [PSA]: Secondary | ICD-10-CM

## 2018-03-13 ENCOUNTER — Other Ambulatory Visit: Payer: Self-pay | Admitting: Cardiovascular Disease

## 2018-03-13 DIAGNOSIS — I152 Hypertension secondary to endocrine disorders: Secondary | ICD-10-CM

## 2018-03-17 NOTE — Telephone Encounter (Signed)
Rx request sent to pharmacy.  

## 2018-04-14 ENCOUNTER — Emergency Department (HOSPITAL_COMMUNITY): Payer: Medicare HMO

## 2018-04-14 ENCOUNTER — Encounter (HOSPITAL_COMMUNITY): Payer: Self-pay | Admitting: Emergency Medicine

## 2018-04-14 ENCOUNTER — Encounter: Payer: Self-pay | Admitting: Orthopedic Surgery

## 2018-04-14 ENCOUNTER — Inpatient Hospital Stay (HOSPITAL_COMMUNITY)
Admission: EM | Admit: 2018-04-14 | Discharge: 2018-04-20 | DRG: 470 | Disposition: A | Discharge status: Skilled Nursing Facility | Payer: Medicare HMO | Attending: Internal Medicine | Admitting: Internal Medicine

## 2018-04-14 ENCOUNTER — Other Ambulatory Visit: Payer: Self-pay

## 2018-04-14 DIAGNOSIS — E875 Hyperkalemia: Secondary | ICD-10-CM | POA: Diagnosis present

## 2018-04-14 DIAGNOSIS — N319 Neuromuscular dysfunction of bladder, unspecified: Secondary | ICD-10-CM | POA: Diagnosis present

## 2018-04-14 DIAGNOSIS — Z7952 Long term (current) use of systemic steroids: Secondary | ICD-10-CM | POA: Diagnosis not present

## 2018-04-14 DIAGNOSIS — Z6826 Body mass index (BMI) 26.0-26.9, adult: Secondary | ICD-10-CM | POA: Diagnosis not present

## 2018-04-14 DIAGNOSIS — Y92242 Post office as the place of occurrence of the external cause: Secondary | ICD-10-CM

## 2018-04-14 DIAGNOSIS — I69354 Hemiplegia and hemiparesis following cerebral infarction affecting left non-dominant side: Secondary | ICD-10-CM

## 2018-04-14 DIAGNOSIS — S72002A Fracture of unspecified part of neck of left femur, initial encounter for closed fracture: Secondary | ICD-10-CM | POA: Diagnosis present

## 2018-04-14 DIAGNOSIS — E669 Obesity, unspecified: Secondary | ICD-10-CM | POA: Diagnosis present

## 2018-04-14 DIAGNOSIS — S72012A Unspecified intracapsular fracture of left femur, initial encounter for closed fracture: Secondary | ICD-10-CM | POA: Diagnosis present

## 2018-04-14 DIAGNOSIS — I739 Peripheral vascular disease, unspecified: Secondary | ICD-10-CM | POA: Diagnosis present

## 2018-04-14 DIAGNOSIS — I6930 Unspecified sequelae of cerebral infarction: Secondary | ICD-10-CM

## 2018-04-14 DIAGNOSIS — M21619 Bunion of unspecified foot: Secondary | ICD-10-CM | POA: Insufficient documentation

## 2018-04-14 DIAGNOSIS — Z79899 Other long term (current) drug therapy: Secondary | ICD-10-CM | POA: Diagnosis not present

## 2018-04-14 DIAGNOSIS — I2510 Atherosclerotic heart disease of native coronary artery without angina pectoris: Secondary | ICD-10-CM | POA: Diagnosis present

## 2018-04-14 DIAGNOSIS — M25552 Pain in left hip: Secondary | ICD-10-CM | POA: Diagnosis present

## 2018-04-14 DIAGNOSIS — Z8249 Family history of ischemic heart disease and other diseases of the circulatory system: Secondary | ICD-10-CM | POA: Diagnosis not present

## 2018-04-14 DIAGNOSIS — N179 Acute kidney failure, unspecified: Secondary | ICD-10-CM | POA: Diagnosis present

## 2018-04-14 DIAGNOSIS — N400 Enlarged prostate without lower urinary tract symptoms: Secondary | ICD-10-CM | POA: Diagnosis present

## 2018-04-14 DIAGNOSIS — N401 Enlarged prostate with lower urinary tract symptoms: Secondary | ICD-10-CM

## 2018-04-14 DIAGNOSIS — Z01818 Encounter for other preprocedural examination: Secondary | ICD-10-CM | POA: Diagnosis not present

## 2018-04-14 DIAGNOSIS — D3501 Benign neoplasm of right adrenal gland: Secondary | ICD-10-CM | POA: Diagnosis not present

## 2018-04-14 DIAGNOSIS — Z981 Arthrodesis status: Secondary | ICD-10-CM | POA: Diagnosis not present

## 2018-04-14 DIAGNOSIS — R351 Nocturia: Secondary | ICD-10-CM | POA: Diagnosis not present

## 2018-04-14 DIAGNOSIS — I10 Essential (primary) hypertension: Secondary | ICD-10-CM | POA: Diagnosis present

## 2018-04-14 DIAGNOSIS — Z09 Encounter for follow-up examination after completed treatment for conditions other than malignant neoplasm: Secondary | ICD-10-CM

## 2018-04-14 DIAGNOSIS — E785 Hyperlipidemia, unspecified: Secondary | ICD-10-CM | POA: Diagnosis present

## 2018-04-14 DIAGNOSIS — S51012A Laceration without foreign body of left elbow, initial encounter: Secondary | ICD-10-CM | POA: Diagnosis present

## 2018-04-14 DIAGNOSIS — Z7951 Long term (current) use of inhaled steroids: Secondary | ICD-10-CM | POA: Diagnosis not present

## 2018-04-14 DIAGNOSIS — I158 Other secondary hypertension: Secondary | ICD-10-CM | POA: Diagnosis not present

## 2018-04-14 DIAGNOSIS — W010XXA Fall on same level from slipping, tripping and stumbling without subsequent striking against object, initial encounter: Secondary | ICD-10-CM | POA: Diagnosis present

## 2018-04-14 DIAGNOSIS — Z955 Presence of coronary angioplasty implant and graft: Secondary | ICD-10-CM | POA: Diagnosis not present

## 2018-04-14 DIAGNOSIS — Z833 Family history of diabetes mellitus: Secondary | ICD-10-CM | POA: Diagnosis not present

## 2018-04-14 DIAGNOSIS — Z7902 Long term (current) use of antithrombotics/antiplatelets: Secondary | ICD-10-CM

## 2018-04-14 DIAGNOSIS — Z8673 Personal history of transient ischemic attack (TIA), and cerebral infarction without residual deficits: Secondary | ICD-10-CM

## 2018-04-14 DIAGNOSIS — W19XXXA Unspecified fall, initial encounter: Secondary | ICD-10-CM | POA: Diagnosis present

## 2018-04-14 DIAGNOSIS — Z419 Encounter for procedure for purposes other than remedying health state, unspecified: Secondary | ICD-10-CM

## 2018-04-14 LAB — CBC WITH DIFFERENTIAL/PLATELET
ABS IMMATURE GRANULOCYTES: 0.1 10*3/uL (ref 0.0–0.1)
BASOS ABS: 0 10*3/uL (ref 0.0–0.1)
BASOS PCT: 0 %
EOS ABS: 0 10*3/uL (ref 0.0–0.7)
Eosinophils Relative: 0 %
HCT: 43.4 % (ref 39.0–52.0)
Hemoglobin: 14.4 g/dL (ref 13.0–17.0)
IMMATURE GRANULOCYTES: 1 %
Lymphocytes Relative: 9 %
Lymphs Abs: 0.8 10*3/uL (ref 0.7–4.0)
MCH: 33.6 pg (ref 26.0–34.0)
MCHC: 33.2 g/dL (ref 30.0–36.0)
MCV: 101.2 fL — ABNORMAL HIGH (ref 78.0–100.0)
Monocytes Absolute: 0.6 10*3/uL (ref 0.1–1.0)
Monocytes Relative: 6 %
NEUTROS ABS: 8.3 10*3/uL — AB (ref 1.7–7.7)
NEUTROS PCT: 84 %
PLATELETS: 163 10*3/uL (ref 150–400)
RBC: 4.29 MIL/uL (ref 4.22–5.81)
RDW: 12.5 % (ref 11.5–15.5)
WBC: 9.9 10*3/uL (ref 4.0–10.5)

## 2018-04-14 LAB — BASIC METABOLIC PANEL
ANION GAP: 11 (ref 5–15)
BUN: 28 mg/dL — ABNORMAL HIGH (ref 8–23)
CO2: 20 mmol/L — AB (ref 22–32)
CREATININE: 1.28 mg/dL — AB (ref 0.61–1.24)
Calcium: 9.5 mg/dL (ref 8.9–10.3)
Chloride: 102 mmol/L (ref 98–111)
GFR, EST NON AFRICAN AMERICAN: 53 mL/min — AB (ref >60)
Glucose, Bld: 133 mg/dL — ABNORMAL HIGH (ref 70–99)
Potassium: 5.2 mmol/L — ABNORMAL HIGH (ref 3.5–5.1)
SODIUM: 133 mmol/L — AB (ref 135–145)

## 2018-04-14 LAB — PROTIME-INR
INR: 1.11
PROTHROMBIN TIME: 14.2 s (ref 11.4–15.2)

## 2018-04-14 LAB — TYPE AND SCREEN
ABO/RH(D): O POS
Antibody Screen: NEGATIVE

## 2018-04-14 LAB — ABO/RH: ABO/RH(D): O POS

## 2018-04-14 MED ORDER — ONDANSETRON HCL 4 MG/2ML IJ SOLN: 4.0000 mg | Freq: Once | INTRAMUSCULAR | Status: DC | PRN

## 2018-04-14 MED ORDER — LOSARTAN POTASSIUM-HCTZ 100-25 MG PO TABS: 1.0000 | ORAL_TABLET | Freq: Every day | ORAL | Status: DC

## 2018-04-14 MED ORDER — FLUTICASONE PROPIONATE HFA 110 MCG/ACT IN AERO: 1.0000 | INHALATION_SPRAY | Freq: Three times a day (TID) | RESPIRATORY_TRACT | Status: DC

## 2018-04-14 MED ORDER — TIZANIDINE HCL 4 MG PO TABS: 4.0000 mg | ORAL_TABLET | Freq: Every evening | ORAL | Status: DC | PRN

## 2018-04-14 MED ORDER — SODIUM CHLORIDE 0.9 % IV SOLN
Freq: Once | INTRAVENOUS | Status: AC
  Administered 2018-04-15: 01:00:00 via INTRAVENOUS

## 2018-04-14 MED ORDER — SODIUM POLYSTYRENE SULFONATE 15 GM/60ML PO SUSP
15.0000 g | Freq: Once | ORAL | Status: AC
  Administered 2018-04-15: 15 g via ORAL
  Filled 2018-04-14: qty 60

## 2018-04-14 MED ORDER — LABETALOL HCL 300 MG PO TABS
300.0000 mg | ORAL_TABLET | Freq: Two times a day (BID) | ORAL | Status: DC
  Administered 2018-04-15 – 2018-04-16 (×4): 300 mg via ORAL
  Filled 2018-04-14 (×6): qty 1

## 2018-04-14 MED ORDER — HYDRALAZINE HCL 20 MG/ML IJ SOLN: 5.0000 mg | INTRAMUSCULAR | Status: DC | PRN

## 2018-04-14 MED ORDER — SENNA-DOCUSATE SODIUM 8.6-50 MG PO TABS: 2.0000 | ORAL_TABLET | Freq: Every evening | ORAL | Status: DC

## 2018-04-14 MED ORDER — MSM 1000 MG PO TABS: 500.0000 mg | ORAL_TABLET | Freq: Every day | ORAL | Status: DC

## 2018-04-14 MED ORDER — FINASTERIDE 5 MG PO TABS
5.0000 mg | ORAL_TABLET | Freq: Every day | ORAL | Status: DC
  Administered 2018-04-15 – 2018-04-20 (×6): 5 mg via ORAL
  Filled 2018-04-14 (×6): qty 1

## 2018-04-14 MED ORDER — ACETAMINOPHEN 325 MG PO TABS
650.0000 mg | ORAL_TABLET | Freq: Four times a day (QID) | ORAL | Status: DC | PRN
  Administered 2018-04-18: 650 mg via ORAL
  Filled 2018-04-14 (×2): qty 2

## 2018-04-14 MED ORDER — VITAMIN B-6 100 MG PO TABS
100.0000 mg | ORAL_TABLET | Freq: Every day | ORAL | Status: DC
  Administered 2018-04-16 – 2018-04-20 (×5): 100 mg via ORAL
  Filled 2018-04-14 (×6): qty 1

## 2018-04-14 MED ORDER — TAMSULOSIN HCL 0.4 MG PO CAPS
0.4000 mg | ORAL_CAPSULE | Freq: Every day | ORAL | Status: DC
  Administered 2018-04-15 – 2018-04-20 (×6): 0.4 mg via ORAL
  Filled 2018-04-14 (×6): qty 1

## 2018-04-14 MED ORDER — LOSARTAN POTASSIUM 50 MG PO TABS
100.0000 mg | ORAL_TABLET | Freq: Every day | ORAL | Status: DC
  Administered 2018-04-15 – 2018-04-16 (×2): 100 mg via ORAL
  Filled 2018-04-14 (×2): qty 2

## 2018-04-14 MED ORDER — SIMVASTATIN 40 MG PO TABS
40.0000 mg | ORAL_TABLET | Freq: Every evening | ORAL | Status: DC
  Administered 2018-04-15 – 2018-04-19 (×5): 40 mg via ORAL
  Filled 2018-04-14 (×5): qty 1

## 2018-04-14 MED ORDER — SPIRONOLACTONE 100 MG PO TABS: 100.0000 mg | ORAL_TABLET | Freq: Every day | ORAL | Status: DC

## 2018-04-14 MED ORDER — MORPHINE SULFATE (PF) 2 MG/ML IV SOLN: 2.0000 mg | INTRAVENOUS | Status: DC | PRN

## 2018-04-14 MED ORDER — OXYCODONE-ACETAMINOPHEN 5-325 MG PO TABS: 1.0000 | ORAL_TABLET | ORAL | Status: DC | PRN

## 2018-04-14 MED ORDER — HYDROMORPHONE HCL 1 MG/ML IJ SOLN
0.5000 mg | INTRAMUSCULAR | Status: DC | PRN
  Administered 2018-04-14: 0.5 mg via INTRAVENOUS
  Filled 2018-04-14: qty 1

## 2018-04-14 MED ORDER — POLYETHYLENE GLYCOL 3350 17 G PO PACK
17.0000 g | PACK | Freq: Every day | ORAL | Status: DC | PRN
  Filled 2018-04-14: qty 1

## 2018-04-14 MED ORDER — ZOLPIDEM TARTRATE 5 MG PO TABS: 5.0000 mg | ORAL_TABLET | Freq: Every evening | ORAL | Status: DC | PRN

## 2018-04-14 MED ORDER — ONDANSETRON HCL 4 MG/2ML IJ SOLN
4.0000 mg | Freq: Once | INTRAMUSCULAR | Status: AC | PRN
  Administered 2018-04-14: 4 mg via INTRAVENOUS
  Filled 2018-04-14: qty 2

## 2018-04-14 MED ORDER — NIFEDIPINE ER OSMOTIC RELEASE 30 MG PO TB24: 30.0000 mg | ORAL_TABLET | Freq: Every day | ORAL | Status: DC

## 2018-04-14 MED ORDER — SODIUM CHLORIDE 0.9 % IV BOLUS: 1000.0000 mL | Freq: Once | INTRAVENOUS | Status: DC

## 2018-04-14 NOTE — ED Notes (Signed)
To ct

## 2018-04-14 NOTE — ED Triage Notes (Signed)
Pt to triage via [facility]> pt tripped and fell while walking out of [place].  C/o L upper leg pain, L elbow pain/abrasion, and pelvis tenderness.  Pelvis stable per EMS.  Takes Plavix.  EMS reports L leg shortening and rotation is normal for pt.  Denies LOC.  Denies neck and back pain.

## 2018-04-14 NOTE — ED Notes (Signed)
Daughter number is [phone]

## 2018-04-14 NOTE — ED Notes (Signed)
Pt given food per MD

## 2018-04-14 NOTE — H&P (Addendum)
History and Physical    Howard Jackson UUV:[id number] DOB: 20-Sep-1945 DOA: 04/14/2018  Referring MD/NP/PA:   PCP: [facility] @ [place]   Patient coming from:  The patient is coming from home.  At baseline, pt is independent for most of ADL.   Chief Complaint: fall and left hip pain  HPI: Howard Jackson is a 72 y.o. male with medical history significant of benign adrenal pheochromocytoma, primary aldosteronism, hypertension, hyperlipidemia, stroke with left-sided hemiparesis, anxiety, CAD, PVD, BPH, hypertension, hyperlipidemia, who presents with fall and left hip pain.  Patient states that he tripped of his steps, fell when walking out of [place] office today.  He states that he injured her left hip and left elbow, developed severe pain in the left hip, and moderate pain in the left elbow.  No LOC, head injury or neck injury.  He does not have chest pain, shortness of breath, cough, fever or chills.  No nausea vomiting, diarrhea or abdominal pain no symptoms of UTI.  He states that he is constipated recently.  ED Course: pt was found to have WBC 9.9, INR 1.11, potassium 5.2, creatinine 1.28, GFR 60, BUN 28, temperature normal, no tachycardia, no tachypnea, oxygen saturation 98% on room air.  X-ray showed comminuted left femoral fracture.  X-ray of left elbow is limited study, but did not show fracture.  CT head is negative for acute intracranial abnormalities.  CT of C-spine no bony fracture.  Patient is admitted to telemetry bed as inpatient.  Orthopedic surgeon, Dr. Alma Friendly was consulted by EDP.  Review of Systems:   General: no fevers, chills, no body weight gain, has fatigue HEENT: no blurry vision, hearing changes or sore throat Respiratory: no dyspnea, coughing, wheezing CV: no chest pain, no palpitations GI: no nausea, vomiting, abdominal pain, diarrhea, has constipation GU: no dysuria, burning on urination, increased urinary frequency, hematuria  Ext: no leg  edema Neuro: No vision change or hearing loss. Has left sided hemiparesis. Had fall Skin: no rash. Has small skin tear in left elbow MSK: No muscle spasm, no deformity, no limitation of range of movement in spin Heme: No easy bruising.  Travel history: No recent long distant travel.  Allergy:  Allergies  Allergen Reactions  . Elavil [Amitriptyline Hcl] Hypertension  . Sulfa Antibiotics     Past Medical History:  Diagnosis Date  . Adrenal gland disorder ([place])    tumor present - no change-  no recent increase    . Arthritis   . Benign pheochromocytoma of right adrenal gland 06/23/2014   Never had histological diagnosis, but reportedly initially diagnosed in 1989  . Bunion    left foot  . Complication of anesthesia    "died on the table during cervical fusion" '89 - believed to be d/t "too much medication" to treat HTN and was later dx with pheochromocytoma  . Coronary artery disease    cleared for surg. by Dr. Gwenlyn Found - [facility]  . H/O hiatal hernia   . Hammer toe   . Herniated lumbar intervertebral disc   . History of anxiety   . Hyperlipidemia   . Hypertension   . Neurogenic bladder   . Neuromuscular disorder ([place])    carpal tunnel problem, even after surgery release   . Peripheral arterial disease ([place])   . Primary aldosteronism ([place])   . Stroke ([place])    in [place], L sided weakness, wears a brace on L leg & uses cane    Past Surgical  History:  Procedure Laterality Date  . CARPAL TUNNEL RELEASE     bilateral   . CERVICAL FUSION    . CORONARY ANGIOPLASTY WITH STENT PLACEMENT     pt reports having 5 stents in his heart-2011  . HERNIA REPAIR    . INGUINAL HERNIA REPAIR Left 11/18/2012   Procedure: HERNIA REPAIR INGUINAL ADULT;  Surgeon: Harl Bowie, MD;  Location: [facility];  Service: General;  Laterality: Left;  . INSERTION OF MESH Left 11/18/2012   Procedure: INSERTION OF MESH;  Surgeon: Harl Bowie, MD;  Location: [facility];  Service: General;  Laterality: Left;     Social History:  reports that he has never smoked. He has never used smokeless tobacco. He reports that he drinks alcohol. He reports that he does not use drugs.  Family History:  Family History  Problem Relation Age of Onset  . CVA Father   . Diabetes Father   . Alzheimer's disease Father   . Hypertension Sister   . Hypertension Brother   . CVA Paternal Grandmother   . Hypertension Brother   . Hypertension Mother      Prior to Admission medications   Medication Sig Start Date End Date Taking? Authorizing Provider  amoxicillin (AMOXIL) 875 MG tablet Take 875 mg by mouth 2 (two) times daily. 03/31/18  Yes [provider]  clopidogrel (PLAVIX) 75 MG tablet Take 1 tablet (75 mg total) by mouth daily. 03/05/17  Yes Croitoru, Mihai, MD  finasteride (PROSCAR) 5 MG tablet Take 5 mg by mouth daily. 01/19/18  Yes [provider]  FLOVENT HFA 110 MCG/ACT inhaler Inhale 1 puff into the lungs 3 (three) times daily.  04/13/14  Yes [provider]  labetalol (NORMODYNE) 300 MG tablet TAKE 1 TABLET (300 MG TOTAL) BY MOUTH 2 (TWO) TIMES DAILY. 03/17/18  Yes Croitoru, Mihai, MD  losartan-hydrochlorothiazide (HYZAAR) 100-25 MG tablet TOME UNA TABLETA TODOS LOS DIAS Patient taking differently: Take 1 tablet by mouth daily.  09/16/16  Yes Dorena Dew, FNP  meloxicam (MOBIC) 7.5 MG tablet Take 1 tablet (7.5 mg total) by mouth as needed for pain. 07/10/16  Yes Duffy Bruce, MD  Methylsulfonylmethane (MSM) 1000 MG TABS Take 500 mg by mouth daily.   Yes [provider]  Misc Natural Products (COLON CARE PO) Take 2 capsules by mouth every evening.    Yes [provider]  NIFEdipine (PROCARDIA-XL/ADALAT CC) 30 MG 24 hr tablet Take 1 tablet (30 mg) by mouth every evening. Use other prescription (60mg ) in morning only. 06/15/17  Yes Croitoru, Mihai, MD  NIFEdipine (PROCARDIA-XL/ADALAT CC) 60 MG 24 hr tablet Take 1 tablet (60 mg) by mouth every morning. Use other  prescription (30mg ) in evening only. Patient taking differently: Take 60 mg by mouth daily.  06/15/17  Yes Croitoru, Mihai, MD  pyridOXINE (VITAMIN B-6) 100 MG tablet Take 100 mg by mouth daily.   Yes [provider]  sennosides-docusate sodium (SENOKOT-S) 8.6-50 MG tablet Take 2 tablets by mouth every evening.    Yes [provider]  simvastatin (ZOCOR) 40 MG tablet Take 1 tablet (40 mg total) by mouth every evening. 08/25/16  Yes Dorena Dew, FNP  spironolactone (ALDACTONE) 25 MG tablet Take 1 tablet (25 mg total) by mouth daily. Patient taking differently: Take 100 mg by mouth daily.  05/13/17  Yes Croitoru, Mihai, MD  tiZANidine (ZANAFLEX) 4 MG tablet TAKE 1-2 TABLETS (4-8 MG TOTAL) BY MOUTH AT BEDTIME AS NEEDED. FOR MUSCLE SPASMS Patient  taking differently: Take 4-8 mg by mouth at bedtime as needed for muscle spasms.  11/17/16  Yes Dorena Dew, FNP  tamsulosin (FLOMAX) 0.4 MG CAPS capsule Take 1 capsule (0.4 mg total) by mouth daily. Patient not taking: Reported on 04/14/2018 01/09/17   Dorena Dew, FNP  triamcinolone cream (KENALOG) 0.1 % Apply topically 2 (two) times daily. Patient not taking: Reported on 04/14/2018 08/28/16   Dorena Dew, FNP  VENTOLIN HFA 108 (90 Base) MCG/ACT inhaler Inhale 2 puffs into the lungs every 4 (four) hours as needed for wheezing or shortness of breath. Patient not taking: Reported on 04/14/2018 11/25/16   Dorena Dew, FNP    Physical Exam: Vitals:   04/14/18 1636 04/14/18 2010 04/14/18 2015  BP: 117/64 (!) 96/48 107/67  Pulse: 60 72 69  Resp: 16 17 17   Temp: 98.4 F (36.9 C) 98.4 F (36.9 C)   TempSrc: Oral Oral   SpO2: 98% 99% 100%   General: Not in acute distress HEENT:       Eyes: PERRL, EOMI, no scleral icterus.       ENT: No discharge from the ears and nose, no pharynx injection, no tonsillar enlargement.        Neck: No JVD, no bruit, no mass felt. Heme: No neck lymph node enlargement. Cardiac: S1/S2,  RRR, No murmurs, No gallops or rubs. Respiratory: No rales, wheezing, rhonchi or rubs. GI: Soft, nondistended, nontender, no rebound pain, no organomegaly, BS present. GU: No hematuria Ext: No pitting leg edema bilaterally. 2+DP/PT pulse bilaterally. Musculoskeletal: Has tenderness in the left hip. The left leg is externally rotated and shortened. Skin: No rashes.  Has a small skin tear in the left elbow Neuro: Alert, oriented X3, cranial nerves II-XII grossly intact, has left sided hemiparesis and left hand contracture. Psych: Patient is not psychotic, no suicidal or hemocidal ideation.  Labs on Admission: I have personally reviewed following labs and imaging studies  CBC: Recent Labs  Lab 04/14/18 2147  WBC 9.9  NEUTROABS 8.3*  HGB 14.4  HCT 43.4  MCV 101.2*  PLT 176   Basic Metabolic Panel: Recent Labs  Lab 04/14/18 2147  NA 133*  K 5.2*  CL 102  CO2 20*  GLUCOSE 133*  BUN 28*  CREATININE 1.28*  CALCIUM 9.5   GFR: CrCl cannot be calculated (Unknown ideal weight.). Liver Function Tests: No results for input(s): AST, ALT, ALKPHOS, BILITOT, PROT, ALBUMIN in the last 168 hours. No results for input(s): LIPASE, AMYLASE in the last 168 hours. No results for input(s): AMMONIA in the last 168 hours. Coagulation Profile: Recent Labs  Lab 04/14/18 2147  INR 1.11   Cardiac Enzymes: No results for input(s): CKTOTAL, CKMB, CKMBINDEX, TROPONINI in the last 168 hours. BNP (last 3 results) No results for input(s): PROBNP in the last 8760 hours. HbA1C: No results for input(s): HGBA1C in the last 72 hours. CBG: No results for input(s): GLUCAP in the last 168 hours. Lipid Profile: No results for input(s): CHOL, HDL, LDLCALC, TRIG, CHOLHDL, LDLDIRECT in the last 72 hours. Thyroid Function Tests: No results for input(s): TSH, T4TOTAL, FREET4, T3FREE, THYROIDAB in the last 72 hours. Anemia Panel: No results for input(s): VITAMINB12, FOLATE, FERRITIN, TIBC, IRON, RETICCTPCT  in the last 72 hours. Urine analysis:    Component Value Date/Time   COLORURINE STRAW (A) 07/10/2016 1937   APPEARANCEUR CLEAR 07/10/2016 1937   LABSPEC 1.020 11/25/2016 1448   PHURINE 7.0 11/25/2016 1448   GLUCOSEU NEGATIVE 11/25/2016 1448  HGBUR NEGATIVE 11/25/2016 [place] 11/25/2016 1448   KETONESUR NEGATIVE 11/25/2016 1448   PROTEINUR NEGATIVE 11/25/2016 1448   UROBILINOGEN 0.2 11/25/2016 1448   NITRITE NEGATIVE 11/25/2016 1448   LEUKOCYTESUR NEGATIVE 11/25/2016 1448   Sepsis Labs: @LABRCNTIP (procalcitonin:4,lacticidven:4) )No results found for this or any previous visit (from the past 240 hour(s)).   Radiological Exams on Admission: Dg Elbow 2 Views Left  Result Date: 04/14/2018 CLINICAL DATA:  Pain after fall. EXAM: LEFT ELBOW - 2 VIEW COMPARISON:  None. FINDINGS: The lateral view is limited due to over flexion. Mild degenerative changes. No obvious joint effusion. No fractures identified. IMPRESSION: The study is limited due to positioning but no obvious fracture or effusion noted. Electronically Signed   By: Dorise Bullion III M.D   On: 04/14/2018 19:05   Ct Head Wo Contrast  Result Date: 04/14/2018 CLINICAL DATA:  Fall onto pavement EXAM: CT HEAD WITHOUT CONTRAST CT CERVICAL SPINE WITHOUT CONTRAST TECHNIQUE: Multidetector CT imaging of the head and cervical spine was performed following the standard protocol without intravenous contrast. Multiplanar CT image reconstructions of the cervical spine were also generated. COMPARISON:  CT head dated 10/02/2017 FINDINGS: CT HEAD FINDINGS Brain: No evidence of acute infarction, hemorrhage, hydrocephalus, extra-axial collection or mass lesion/mass effect. Encephalomalacic changes related to old right basal ganglia infarct. Ex vacuo dilatation of the right lateral ventricle. Mild subcortical white matter and periventricular small vessel ischemic changes. Vascular: Intracranial atherosclerosis. Skull: Normal. Negative  for fracture or focal lesion. Sinuses/Orbits: The visualized paranasal sinuses are essentially clear. The mastoid air cells are unopacified. Other: None. CT CERVICAL SPINE FINDINGS Alignment: Mild straightening of the cervical spine. Skull base and vertebrae: No acute fracture. No primary bone lesion or focal pathologic process. Soft tissues and spinal canal: No prevertebral fluid or swelling. No visible canal hematoma. Disc levels: Partial fusion at C5-6. Mild multilevel degenerative changes. Spinal canal is patent. Upper chest: Visualized lung apices are clear. Other: 11 mm right thyroid nodule IMPRESSION: No evidence of acute intracranial abnormality. Old right basal ganglia infarct. Mild small vessel ischemic changes. No evidence of traumatic injury to the cervical spine. Mild degenerative changes. Electronically Signed   By: Julian Hy M.D.   On: 04/14/2018 21:29   Ct Cervical Spine Wo Contrast  Result Date: 04/14/2018 CLINICAL DATA:  Fall onto pavement EXAM: CT HEAD WITHOUT CONTRAST CT CERVICAL SPINE WITHOUT CONTRAST TECHNIQUE: Multidetector CT imaging of the head and cervical spine was performed following the standard protocol without intravenous contrast. Multiplanar CT image reconstructions of the cervical spine were also generated. COMPARISON:  CT head dated 10/02/2017 FINDINGS: CT HEAD FINDINGS Brain: No evidence of acute infarction, hemorrhage, hydrocephalus, extra-axial collection or mass lesion/mass effect. Encephalomalacic changes related to old right basal ganglia infarct. Ex vacuo dilatation of the right lateral ventricle. Mild subcortical white matter and periventricular small vessel ischemic changes. Vascular: Intracranial atherosclerosis. Skull: Normal. Negative for fracture or focal lesion. Sinuses/Orbits: The visualized paranasal sinuses are essentially clear. The mastoid air cells are unopacified. Other: None. CT CERVICAL SPINE FINDINGS Alignment: Mild straightening of the cervical  spine. Skull base and vertebrae: No acute fracture. No primary bone lesion or focal pathologic process. Soft tissues and spinal canal: No prevertebral fluid or swelling. No visible canal hematoma. Disc levels: Partial fusion at C5-6. Mild multilevel degenerative changes. Spinal canal is patent. Upper chest: Visualized lung apices are clear. Other: 11 mm right thyroid nodule IMPRESSION: No evidence of acute intracranial abnormality. Old right basal ganglia infarct. Mild  small vessel ischemic changes. No evidence of traumatic injury to the cervical spine. Mild degenerative changes. Electronically Signed   By: Julian Hy M.D.   On: 04/14/2018 21:29   Ct Hip Left Wo Contrast  Result Date: 04/14/2018 CLINICAL DATA:  Hip fracture EXAM: CT OF THE LEFT HIP WITHOUT CONTRAST TECHNIQUE: Multidetector CT imaging of the left hip was performed according to the standard protocol. Multiplanar CT image reconstructions were also generated. COMPARISON:  Radiograph 04/14/2018 FINDINGS: Bones/Joint/Cartilage Left femoral head projects in joint. Acute mildly comminuted and impacted fracture involving the left femoral neck. Fracture lucency is present within the subcapital left femur with fracture lucency extending to the base of the femoral neck. No fracture lucency identified within the greater or lesser trochanter. No femoral shaft fracture lucency. Ligaments Suboptimally assessed by CT. Muscles and Tendons No significant intramuscular hematoma.  No muscular atrophy. Soft tissues Vascular calcifications. Soft tissue thickening in the left inguinal canal. Small right fat containing inguinal hernia. Edema surrounding the left hip. IMPRESSION: Acute mildly comminuted and impacted fracture involving the left femoral neck which appears to involve the subcapital region with extension of fracture lucency to the base of the femoral neck. No fracture lucency identified within the trochanters or the proximal shaft of the femur.  Femoral head is normally positioned. Electronically Signed   By: Donavan Foil M.D.   On: 04/14/2018 23:08   Dg Chest Portable 1 View  Result Date: 04/14/2018 CLINICAL DATA:  72 y/o  M; fall, preop. EXAM: PORTABLE CHEST 1 VIEW COMPARISON:  07/10/2018 chest radiograph. FINDINGS: Stable heart size and mediastinal contours are within normal limits. Both lungs are clear. Severe osteoarthrosis of left knee no humeral joint, partially visualized. No acute fracture identified. IMPRESSION: No active disease. Electronically Signed   By: Kristine Garbe M.D.   On: 04/14/2018 23:37   Dg Hip Unilat With Pelvis 2-3 Views Left  Result Date: 04/14/2018 CLINICAL DATA:  Pain after fall EXAM: DG HIP (WITH OR WITHOUT PELVIS) 2-3V LEFT COMPARISON:  None. FINDINGS: There is a comminuted fracture through the left femoral neck with mild displacement. IMPRESSION: Comminuted fracture through the left femoral neck. Electronically Signed   By: Dorise Bullion III M.D   On: 04/14/2018 19:06     EKG: Independently reviewed.  Sinus rhythm, QTC 426, LAD, nonspecific T wave change.  Assessment/Plan Principal Problem:   Fracture of femoral neck, left, closed ([facility]) Active Problems:   HTN (hypertension)   History of stroke   CAD (coronary artery disease)   Dyslipidemia   Benign pheochromocytoma of right adrenal gland   BPH (benign prostatic hyperplasia)   Fall   Closed displaced fracture of left femoral neck ([facility])   Fracture of femoral neck, left, closed ([place]): As evidenced by x-ray. Patient has severe pain now. Orthopedic surgeon, Dr. Alma Friendly was consulted, planning to do surgery tomorrow. Pt has benign adrenal pheochromocytoma, this issue needs to be addressed before surgery. Pt may need to pretreated with alpha-adrenal receptor blocker. I will request Card's help for this issue.  - will admit to tele bed as inpt - Pain control: morphine prn and percocet - When necessary Zofran for nausea - prn tizanidine for  muscle spasm - type and cross - INR/PTT - PT/OT when able to (not ordered now)  HTN (hypertension) and hx of benign adrenal pheochromocytoma: pt is labetalol, Hyzaar, nifedipine, spironolactone.  Patient was on Flomax for BPH, but currently not taking his medications. Pt may need long-lasting, irreversible alpha receptor blocker, such  as phenoxybenzamine.   -Will resume Flomax due to alpha-receptor blocking effect which is good for pheochromocytoma -Switch Hyzaar to losartan, discontinue HCTZ since patient needs IV fluid -hold spironolactone due to hyperkalemia -will consult card for helping to manage HTN perioperatively. -card consult was requested via Epic.  Fall: Patient had a mechanical fall, CT head is negative.  CT of C-spine has no fracture. -PT/OT when able to.  History of stroke: -hold plavix for surgery -continue Zocor  CAD (coronary artery disease): No CP -continue labetalol and Zocor  Dyslipidemia: -zocor  Benign pheochromocytoma of right adrenal gland:  BPH: stable - Continue Flomax and Proscar   DVT ppx: SCD Code Status: Full code Family Communication: None at bed side.     Disposition Plan:  Anticipate discharge back to previous home environment Consults called:  [place], Dr. Alma Friendly Admission status:   Inpatient/tele     Date of Service 04/15/2018    Ivor Costa [facility] Pager [phone]  If 7PM-7AM, please contact night-coverage [email or web address] Password [email or web address] 04/15/2018, 12:27 AM

## 2018-04-14 NOTE — ED Provider Notes (Signed)
[facility] EMERGENCY DEPARTMENT Provider Note   CSN: [id number] Arrival date & time: 04/14/18  1626     History   Chief Complaint Chief Complaint  Patient presents with  . Fall  . Elbow Pain  . Leg Pain    HPI MISTER KRAHENBUHL is a 72 y.o. male.  HPI 72 year old male with extensive past medical history as below here with left hip and elbow pain.  The patient states that he had history of stroke with chronic left-sided weakness.  He tripped while getting into his car today, and fell onto his left hip and left shoulder.  He denies any head injury.  He sustained immediate onset of severe, aching, throbbing, left hip pain.  Is been unable to walk due to this.  The pain does not radiate.  Denies any new numbness or weakness.  He does not believe he hit his head.  He did sustained a wound to his left elbow but states that he has only mild pain at the time.  No chest pain or shortness of breath.  He does not believe it is abdomen.  He was well prior to the fall.  He is on Plavix and has been taking as prescribed.  No other recent medication changes.   Past Medical History:  Diagnosis Date  . Adrenal gland disorder ([place])    tumor present - no change-  no recent increase    . Arthritis   . Benign pheochromocytoma of right adrenal gland 06/23/2014   Never had histological diagnosis, but reportedly initially diagnosed in 1989  . Bunion    left foot  . Complication of anesthesia    "died on the table during cervical fusion" '89 - believed to be d/t "too much medication" to treat HTN and was later dx with pheochromocytoma  . Coronary artery disease    cleared for surg. by Dr. Gwenlyn Found - [facility]  . H/O hiatal hernia   . Hammer toe   . Herniated lumbar intervertebral disc   . History of anxiety   . Hyperlipidemia   . Hypertension   . Neurogenic bladder   . Neuromuscular disorder ([place])    carpal tunnel problem, even after surgery release   . Peripheral arterial disease  ([place])   . Primary aldosteronism ([place])   . Stroke [facility])    in [place], L sided weakness, wears a brace on L leg & uses cane    Patient Active Problem List   Diagnosis Date Noted  . Bunion   . Hypertension due to endocrine disorder 03/07/2017  . BPH (benign prostatic hyperplasia) 11/25/2016  . Chronic bilateral thoracic back pain 08/20/2016  . Mild intermittent asthma 06/18/2016  . Wheezing 06/18/2016  . Cough 06/18/2016  . Left flank pain 04/18/2015  . Benign pheochromocytoma of right adrenal gland 06/23/2014  . PAD (peripheral artery disease) ([place]) 03/31/2013  . HTN (hypertension) 03/31/2013  . History of stroke 03/31/2013  . CAD (coronary artery disease) 03/31/2013  . Renal artery stenosis ([place]) 03/31/2013  . Erectile dysfunction 03/31/2013  . Dyslipidemia 03/31/2013  . Carotid stenosis 03/31/2013  . Left inguinal hernia 09/22/2012    Past Surgical History:  Procedure Laterality Date  . CARPAL TUNNEL RELEASE     bilateral   . CERVICAL FUSION    . CORONARY ANGIOPLASTY WITH STENT PLACEMENT     pt reports having 5 stents in his heart-2011  . HERNIA REPAIR    . INGUINAL HERNIA REPAIR Left 11/18/2012   Procedure:  HERNIA REPAIR INGUINAL ADULT;  Surgeon: Harl Bowie, MD;  Location: [place];  Service: General;  Laterality: Left;  . INSERTION OF MESH Left 11/18/2012   Procedure: INSERTION OF MESH;  Surgeon: Harl Bowie, MD;  Location: [place];  Service: General;  Laterality: Left;        Home Medications    Prior to Admission medications   Medication Sig Start Date End Date Taking? Authorizing Provider  clopidogrel (PLAVIX) 75 MG tablet Take 1 tablet (75 mg total) by mouth daily. 03/05/17  Yes Croitoru, Mihai, MD  FLOVENT HFA 110 MCG/ACT inhaler Inhale 1 puff into the lungs 3 (three) times daily.  04/13/14  Yes [provider]  labetalol (NORMODYNE) 300 MG tablet TAKE 1 TABLET (300 MG TOTAL) BY MOUTH 2 (TWO) TIMES DAILY. 03/17/18  Yes Croitoru, Mihai, MD    losartan-hydrochlorothiazide (HYZAAR) 100-25 MG tablet TOME UNA TABLETA TODOS LOS DIAS 09/16/16  Yes Dorena Dew, FNP  meloxicam (MOBIC) 7.5 MG tablet Take 1 tablet (7.5 mg total) by mouth as needed for pain. 07/10/16  Yes Duffy Bruce, MD  NIFEdipine (PROCARDIA-XL/ADALAT CC) 30 MG 24 hr tablet Take 1 tablet (30 mg) by mouth every evening. Use other prescription (60mg ) in morning only. 06/15/17  Yes Croitoru, Mihai, MD  NIFEdipine (PROCARDIA-XL/ADALAT CC) 60 MG 24 hr tablet Take 1 tablet (60 mg) by mouth every morning. Use other prescription (30mg ) in evening only. 06/15/17  Yes Croitoru, Mihai, MD  simvastatin (ZOCOR) 40 MG tablet Take 1 tablet (40 mg total) by mouth every evening. 08/25/16  Yes Dorena Dew, FNP  spironolactone (ALDACTONE) 25 MG tablet Take 1 tablet (25 mg total) by mouth daily. Patient taking differently: Take 100 mg by mouth daily.  05/13/17  Yes Croitoru, Mihai, MD  tiZANidine (ZANAFLEX) 4 MG tablet TAKE 1-2 TABLETS (4-8 MG TOTAL) BY MOUTH AT BEDTIME AS NEEDED. FOR MUSCLE SPASMS 11/17/16  Yes Dorena Dew, FNP  Methylsulfonylmethane (MSM) 1000 MG TABS Take 500 mg by mouth daily.    [provider]  Misc Natural Products (COLON CARE PO) Take 2 capsules by mouth every evening.     [provider]  pyridOXINE (VITAMIN B-6) 100 MG tablet Take 100 mg by mouth daily.    [provider]  sennosides-docusate sodium (SENOKOT-S) 8.6-50 MG tablet Take 2 tablets by mouth every evening.     [provider]  tamsulosin (FLOMAX) 0.4 MG CAPS capsule Take 1 capsule (0.4 mg total) by mouth daily. 01/09/17   Dorena Dew, FNP  triamcinolone cream (KENALOG) 0.1 % Apply topically 2 (two) times daily. 08/28/16   Dorena Dew, FNP  VENTOLIN HFA 108 (90 Base) MCG/ACT inhaler Inhale 2 puffs into the lungs every 4 (four) hours as needed for wheezing or shortness of breath. 11/25/16   Dorena Dew, FNP    Family History Family History   Problem Relation Age of Onset  . CVA Father   . Diabetes Father   . Alzheimer's disease Father   . Hypertension Sister   . Hypertension Brother   . CVA Paternal Grandmother   . Hypertension Brother   . Hypertension Mother     Social History Social History   Tobacco Use  . Smoking status: Never Smoker  . Smokeless tobacco: Never Used  Substance Use Topics  . Alcohol use: Yes    Comment: social - one drink, on special occasion   . Drug use: No     Allergies   Elavil [  amitriptyline hcl] and Sulfa antibiotics   Review of Systems Review of Systems  Constitutional: Negative for chills, fatigue and fever.  HENT: Negative for congestion and rhinorrhea.   Eyes: Negative for visual disturbance.  Respiratory: Negative for cough, shortness of breath and wheezing.   Cardiovascular: Negative for chest pain and leg swelling.  Gastrointestinal: Negative for abdominal pain, diarrhea, nausea and vomiting.  Genitourinary: Negative for dysuria and flank pain.  Musculoskeletal: Positive for arthralgias and back pain. Negative for neck pain and neck stiffness.  Skin: Negative for rash and wound.  Allergic/Immunologic: Negative for immunocompromised state.  Neurological: Positive for weakness. Negative for syncope and headaches.  All other systems reviewed and are negative.    Physical Exam Updated Vital Signs BP 107/67   Pulse 69   Temp 98.4 F (36.9 C) (Oral)   Resp 17   SpO2 100%   Physical Exam  Constitutional: He is oriented to person, place, and time. He appears well-developed and well-nourished. No distress.  HENT:  Head: Normocephalic and atraumatic.  No apparent head or neck trauma  Eyes: Conjunctivae are normal.  Neck: Neck supple.  Cardiovascular: Normal rate, regular rhythm and normal heart sounds. Exam reveals no friction rub.  No murmur heard. Pulmonary/Chest: Effort normal and breath sounds normal. No respiratory distress. He has no wheezes. He has no rales.   Abdominal: Soft. He exhibits no distension.  Musculoskeletal: He exhibits no edema.  Superficial abrasion of her left elbow.  Moderate bony tenderness.  No deformity.    Neurological: He is alert and oriented to person, place, and time. He exhibits normal muscle tone.  Skin: Skin is warm. Capillary refill takes less than 2 seconds.  Psychiatric: He has a normal mood and affect.  Nursing note and vitals reviewed.  LOWER EXTREMITY EXAM: LEFT  INSPECTION & PALPATION: Moderate TTP left hip, no bruising. No deformity. Midl shortening and external rotation.  SENSORY: sensation is intact to light touch in:  Superficial peroneal nerve distribution (over dorsum of foot) Deep peroneal nerve distribution (over first dorsal web space) Sural nerve distribution (over lateral aspect 5th metatarsal) Saphenous nerve distribution (over medial instep)  MOTOR:  + Motor EHL (great toe dorsiflexion) + FHL (great toe plantar flexion)  + TA (ankle dorsiflexion)  + GSC (ankle plantar flexion)  VASCULAR: 2+ dorsalis pedis and posterior tibialis pulses Capillary refill < 2 sec, toes warm and well-perfused  COMPARTMENTS: Soft, warm, well-perfused No pain with passive extension No parethesias    ED Treatments / Results  Labs (all labs ordered are listed, but only abnormal results are displayed) Labs Reviewed  BASIC METABOLIC PANEL - Abnormal; Notable for the following components:      Result Value   Sodium 133 (*)    Potassium 5.2 (*)    CO2 20 (*)    Glucose, Bld 133 (*)    BUN 28 (*)    Creatinine, Ser 1.28 (*)    GFR calc non Af Amer 53 (*)    All other components within normal limits  CBC WITH DIFFERENTIAL/PLATELET - Abnormal; Notable for the following components:   MCV 101.2 (*)    Neutro Abs 8.3 (*)    All other components within normal limits  PROTIME-INR  TYPE AND SCREEN  ABO/RH    EKG None  Radiology Dg Elbow 2 Views Left  Result Date: 04/14/2018 CLINICAL DATA:  Pain  after fall. EXAM: LEFT ELBOW - 2 VIEW COMPARISON:  None. FINDINGS: The lateral view is limited due to over  flexion. Mild degenerative changes. No obvious joint effusion. No fractures identified. IMPRESSION: The study is limited due to positioning but no obvious fracture or effusion noted. Electronically Signed   By: Dorise Bullion III M.D   On: 04/14/2018 19:05   Ct Head Wo Contrast  Result Date: 04/14/2018 CLINICAL DATA:  Fall onto pavement EXAM: CT HEAD WITHOUT CONTRAST CT CERVICAL SPINE WITHOUT CONTRAST TECHNIQUE: Multidetector CT imaging of the head and cervical spine was performed following the standard protocol without intravenous contrast. Multiplanar CT image reconstructions of the cervical spine were also generated. COMPARISON:  CT head dated 10/02/2017 FINDINGS: CT HEAD FINDINGS Brain: No evidence of acute infarction, hemorrhage, hydrocephalus, extra-axial collection or mass lesion/mass effect. Encephalomalacic changes related to old right basal ganglia infarct. Ex vacuo dilatation of the right lateral ventricle. Mild subcortical white matter and periventricular small vessel ischemic changes. Vascular: Intracranial atherosclerosis. Skull: Normal. Negative for fracture or focal lesion. Sinuses/Orbits: The visualized paranasal sinuses are essentially clear. The mastoid air cells are unopacified. Other: None. CT CERVICAL SPINE FINDINGS Alignment: Mild straightening of the cervical spine. Skull base and vertebrae: No acute fracture. No primary bone lesion or focal pathologic process. Soft tissues and spinal canal: No prevertebral fluid or swelling. No visible canal hematoma. Disc levels: Partial fusion at C5-6. Mild multilevel degenerative changes. Spinal canal is patent. Upper chest: Visualized lung apices are clear. Other: 11 mm right thyroid nodule IMPRESSION: No evidence of acute intracranial abnormality. Old right basal ganglia infarct. Mild small vessel ischemic changes. No evidence of traumatic  injury to the cervical spine. Mild degenerative changes. Electronically Signed   By: Julian Hy M.D.   On: 04/14/2018 21:29   Ct Cervical Spine Wo Contrast  Result Date: 04/14/2018 CLINICAL DATA:  Fall onto pavement EXAM: CT HEAD WITHOUT CONTRAST CT CERVICAL SPINE WITHOUT CONTRAST TECHNIQUE: Multidetector CT imaging of the head and cervical spine was performed following the standard protocol without intravenous contrast. Multiplanar CT image reconstructions of the cervical spine were also generated. COMPARISON:  CT head dated 10/02/2017 FINDINGS: CT HEAD FINDINGS Brain: No evidence of acute infarction, hemorrhage, hydrocephalus, extra-axial collection or mass lesion/mass effect. Encephalomalacic changes related to old right basal ganglia infarct. Ex vacuo dilatation of the right lateral ventricle. Mild subcortical white matter and periventricular small vessel ischemic changes. Vascular: Intracranial atherosclerosis. Skull: Normal. Negative for fracture or focal lesion. Sinuses/Orbits: The visualized paranasal sinuses are essentially clear. The mastoid air cells are unopacified. Other: None. CT CERVICAL SPINE FINDINGS Alignment: Mild straightening of the cervical spine. Skull base and vertebrae: No acute fracture. No primary bone lesion or focal pathologic process. Soft tissues and spinal canal: No prevertebral fluid or swelling. No visible canal hematoma. Disc levels: Partial fusion at C5-6. Mild multilevel degenerative changes. Spinal canal is patent. Upper chest: Visualized lung apices are clear. Other: 11 mm right thyroid nodule IMPRESSION: No evidence of acute intracranial abnormality. Old right basal ganglia infarct. Mild small vessel ischemic changes. No evidence of traumatic injury to the cervical spine. Mild degenerative changes. Electronically Signed   By: Julian Hy M.D.   On: 04/14/2018 21:29   Ct Hip Left Wo Contrast  Result Date: 04/14/2018 CLINICAL DATA:  Hip fracture EXAM: CT OF  THE LEFT HIP WITHOUT CONTRAST TECHNIQUE: Multidetector CT imaging of the left hip was performed according to the standard protocol. Multiplanar CT image reconstructions were also generated. COMPARISON:  Radiograph 04/14/2018 FINDINGS: Bones/Joint/Cartilage Left femoral head projects in joint. Acute mildly comminuted and impacted fracture involving the left femoral  neck. Fracture lucency is present within the subcapital left femur with fracture lucency extending to the base of the femoral neck. No fracture lucency identified within the greater or lesser trochanter. No femoral shaft fracture lucency. Ligaments Suboptimally assessed by CT. Muscles and Tendons No significant intramuscular hematoma.  No muscular atrophy. Soft tissues Vascular calcifications. Soft tissue thickening in the left inguinal canal. Small right fat containing inguinal hernia. Edema surrounding the left hip. IMPRESSION: Acute mildly comminuted and impacted fracture involving the left femoral neck which appears to involve the subcapital region with extension of fracture lucency to the base of the femoral neck. No fracture lucency identified within the trochanters or the proximal shaft of the femur. Femoral head is normally positioned. Electronically Signed   By: Donavan Foil M.D.   On: 04/14/2018 23:08   Dg Hip Unilat With Pelvis 2-3 Views Left  Result Date: 04/14/2018 CLINICAL DATA:  Pain after fall EXAM: DG HIP (WITH OR WITHOUT PELVIS) 2-3V LEFT COMPARISON:  None. FINDINGS: There is a comminuted fracture through the left femoral neck with mild displacement. IMPRESSION: Comminuted fracture through the left femoral neck. Electronically Signed   By: Dorise Bullion III M.D   On: 04/14/2018 19:06    Procedures Procedures (including critical care time)  Medications Ordered in ED Medications  HYDROmorphone (DILAUDID) injection 0.5 mg (0.5 mg Intravenous Given 04/14/18 2208)  sodium chloride 0.9 % bolus 1,000 mL (has no administration in  time range)  0.9 %  sodium chloride infusion (has no administration in time range)  ondansetron (ZOFRAN) injection 4 mg (4 mg Intravenous Given 04/14/18 2206)     Initial Impression / Assessment and Plan / ED Course  I have reviewed the triage vital signs and the nursing notes.  Pertinent labs & imaging results that were available during my care of the patient were reviewed by me and considered in my medical decision making (see chart for details).     72 year old male here with left hip pain after mechanical fall.  Imaging shows left femoral neck fracture.  Discussed with Dr. Theda Sers as patient has seen Dr. Alma Friendly in the past, and patient to be n.p.o. at midnight.  Patient hemodynamically stable.  No apparent head injury.  Patient given fluids for mild dehydration and will admit to medicine.  Final Clinical Impressions(s) / ED Diagnoses   Final diagnoses:  Fall  Closed fracture of left hip, initial encounter [facility])    ED Discharge Orders    None       Duffy Bruce, MD 04/14/18 2313

## 2018-04-14 NOTE — ED Provider Notes (Signed)
Patient placed in Quick Look pathway, seen and evaluated   Chief Complaint: fall  HPI: SHAKAI DOLLEY is a 72 y.o. male who presents to the ED s/p fall with c/o left hip and left elbow pain. [place] and tripped and turned his foot causing the fall.   ROS: M/S: hip and elbow pain  Physical Exam:  BP 117/64 (BP Location: Right Arm)   Pulse 60   Temp 98.4 F (36.9 C) (Oral)   Resp 16   SpO2 98%    Gen: No distress  Neuro: Awake and Alert  Skin: Warm and dry  M/S: tender with palpation left hip and left elbow   Initiation of care has begun. The patient has been counseled on the process, plan, and necessity for staying for the completion/evaluation, and the remainder of the medical screening examination    Ashley Murrain, NP 04/14/18 [place], MD 04/14/18 2350

## 2018-04-15 ENCOUNTER — Other Ambulatory Visit: Payer: Self-pay

## 2018-04-15 ENCOUNTER — Encounter (HOSPITAL_COMMUNITY): Payer: Self-pay | Admitting: Anesthesiology

## 2018-04-15 ENCOUNTER — Inpatient Hospital Stay (HOSPITAL_COMMUNITY): Payer: Medicare HMO | Admitting: Certified Registered"

## 2018-04-15 ENCOUNTER — Inpatient Hospital Stay (HOSPITAL_COMMUNITY): Payer: Medicare HMO

## 2018-04-15 ENCOUNTER — Encounter (HOSPITAL_COMMUNITY)
Admission: EM | Disposition: A | Discharge status: Skilled Nursing Facility | Payer: Self-pay | Source: Home / Self Care | Attending: Internal Medicine

## 2018-04-15 DIAGNOSIS — S72002A Fracture of unspecified part of neck of left femur, initial encounter for closed fracture: Secondary | ICD-10-CM

## 2018-04-15 DIAGNOSIS — I2510 Atherosclerotic heart disease of native coronary artery without angina pectoris: Secondary | ICD-10-CM

## 2018-04-15 DIAGNOSIS — I10 Essential (primary) hypertension: Secondary | ICD-10-CM

## 2018-04-15 DIAGNOSIS — D3501 Benign neoplasm of right adrenal gland: Secondary | ICD-10-CM

## 2018-04-15 DIAGNOSIS — Z8673 Personal history of transient ischemic attack (TIA), and cerebral infarction without residual deficits: Secondary | ICD-10-CM

## 2018-04-15 DIAGNOSIS — Z01818 Encounter for other preprocedural examination: Secondary | ICD-10-CM

## 2018-04-15 HISTORY — PX: ANTERIOR APPROACH HEMI HIP ARTHROPLASTY: SHX6690

## 2018-04-15 LAB — BASIC METABOLIC PANEL
ANION GAP: 10 (ref 5–15)
BUN: 23 mg/dL (ref 8–23)
CHLORIDE: 103 mmol/L (ref 98–111)
CO2: 20 mmol/L — ABNORMAL LOW (ref 22–32)
Calcium: 8.6 mg/dL — ABNORMAL LOW (ref 8.9–10.3)
Creatinine, Ser: 1.15 mg/dL (ref 0.61–1.24)
GFR calc Af Amer: >60 mL/min (ref >60)
GLUCOSE: 99 mg/dL (ref 70–99)
Potassium: 4.2 mmol/L (ref 3.5–5.1)
Sodium: 133 mmol/L — ABNORMAL LOW (ref 135–145)

## 2018-04-15 LAB — APTT: aPTT: 38 seconds — ABNORMAL HIGH (ref 24–36)

## 2018-04-15 LAB — SURGICAL PCR SCREEN
MRSA, PCR: NEGATIVE
STAPHYLOCOCCUS AUREUS: NEGATIVE

## 2018-04-15 SURGERY — HEMIARTHROPLASTY, HIP, DIRECT ANTERIOR APPROACH, FOR FRACTURE
Anesthesia: General | Site: Hip | Laterality: Left

## 2018-04-15 MED ORDER — CHLORHEXIDINE GLUCONATE 4 % EX LIQD
60.0000 mL | Freq: Once | CUTANEOUS | Status: AC
  Administered 2018-04-15: 4 via TOPICAL

## 2018-04-15 MED ORDER — ROCURONIUM BROMIDE 50 MG/5ML IV SOSY
PREFILLED_SYRINGE | INTRAVENOUS | Status: AC
  Filled 2018-04-15: qty 5

## 2018-04-15 MED ORDER — SPIRONOLACTONE 100 MG PO TABS
100.0000 mg | ORAL_TABLET | Freq: Every day | ORAL | Status: DC
  Administered 2018-04-16 – 2018-04-20 (×5): 100 mg via ORAL
  Filled 2018-04-15: qty 1
  Filled 2018-04-15 (×2): qty 4
  Filled 2018-04-15: qty 1
  Filled 2018-04-15: qty 4
  Filled 2018-04-15: qty 1
  Filled 2018-04-15: qty 4
  Filled 2018-04-15: qty 1
  Filled 2018-04-15 (×2): qty 4
  Filled 2018-04-15: qty 1

## 2018-04-15 MED ORDER — PROMETHAZINE HCL 25 MG/ML IJ SOLN: 6.2500 mg | INTRAMUSCULAR | Status: DC | PRN

## 2018-04-15 MED ORDER — POVIDONE-IODINE 10 % EX SWAB: 2.0000 "application " | Freq: Once | CUTANEOUS | Status: DC

## 2018-04-15 MED ORDER — SUGAMMADEX SODIUM 200 MG/2ML IV SOLN
INTRAVENOUS | Status: DC | PRN
  Administered 2018-04-15: 200 mg via INTRAVENOUS

## 2018-04-15 MED ORDER — ACETAMINOPHEN 10 MG/ML IV SOLN
1000.0000 mg | INTRAVENOUS | Status: AC
  Administered 2018-04-15: 1000 mg via INTRAVENOUS
  Filled 2018-04-15: qty 100

## 2018-04-15 MED ORDER — METOCLOPRAMIDE HCL 5 MG PO TABS: 5.0000 mg | ORAL_TABLET | Freq: Three times a day (TID) | ORAL | Status: DC | PRN

## 2018-04-15 MED ORDER — LACTATED RINGERS IV SOLN
INTRAVENOUS | Status: DC
  Administered 2018-04-15 (×2): via INTRAVENOUS

## 2018-04-15 MED ORDER — TRANEXAMIC ACID 1000 MG/10ML IV SOLN
1000.0000 mg | INTRAVENOUS | Status: AC
  Administered 2018-04-15: 1000 mg via INTRAVENOUS
  Filled 2018-04-15: qty 1000

## 2018-04-15 MED ORDER — ALUM & MAG HYDROXIDE-SIMETH 200-200-20 MG/5ML PO SUSP
30.0000 mL | Freq: Four times a day (QID) | ORAL | Status: DC | PRN
  Administered 2018-04-15: 30 mL via ORAL
  Filled 2018-04-15: qty 30

## 2018-04-15 MED ORDER — LIDOCAINE 2% (20 MG/ML) 5 ML SYRINGE
INTRAMUSCULAR | Status: DC | PRN
  Administered 2018-04-15: 50 mg via INTRAVENOUS

## 2018-04-15 MED ORDER — MIDAZOLAM HCL 5 MG/5ML IJ SOLN
INTRAMUSCULAR | Status: DC | PRN
  Administered 2018-04-15: 1 mg via INTRAVENOUS

## 2018-04-15 MED ORDER — MEPERIDINE HCL 50 MG/ML IJ SOLN: 6.2500 mg | INTRAMUSCULAR | Status: DC | PRN

## 2018-04-15 MED ORDER — LACTATED RINGERS IV SOLN: INTRAVENOUS | Status: DC

## 2018-04-15 MED ORDER — 0.9 % SODIUM CHLORIDE (POUR BTL) OPTIME
TOPICAL | Status: DC | PRN
  Administered 2018-04-15: 1000 mL

## 2018-04-15 MED ORDER — SODIUM CHLORIDE 0.9 % IJ SOLN
INTRAMUSCULAR | Status: DC | PRN
  Administered 2018-04-15: 30 mL via INTRAVENOUS

## 2018-04-15 MED ORDER — KETOROLAC TROMETHAMINE 30 MG/ML IJ SOLN
INTRAMUSCULAR | Status: AC
  Filled 2018-04-15: qty 1

## 2018-04-15 MED ORDER — BUDESONIDE 0.25 MG/2ML IN SUSP
0.2500 mg | Freq: Two times a day (BID) | RESPIRATORY_TRACT | Status: DC
  Administered 2018-04-15 – 2018-04-20 (×11): 0.25 mg via RESPIRATORY_TRACT
  Filled 2018-04-15 (×12): qty 2

## 2018-04-15 MED ORDER — NIFEDIPINE ER OSMOTIC RELEASE 60 MG PO TB24
60.0000 mg | ORAL_TABLET | Freq: Every day | ORAL | Status: DC
  Administered 2018-04-16 – 2018-04-20 (×5): 60 mg via ORAL
  Filled 2018-04-15 (×6): qty 1

## 2018-04-15 MED ORDER — FENTANYL CITRATE (PF) 250 MCG/5ML IJ SOLN
INTRAMUSCULAR | Status: AC
  Filled 2018-04-15: qty 5

## 2018-04-15 MED ORDER — MORPHINE SULFATE (PF) 2 MG/ML IV SOLN: 0.5000 mg | INTRAVENOUS | Status: DC | PRN

## 2018-04-15 MED ORDER — DEXAMETHASONE SODIUM PHOSPHATE 10 MG/ML IJ SOLN
INTRAMUSCULAR | Status: AC
  Filled 2018-04-15: qty 1

## 2018-04-15 MED ORDER — CEFAZOLIN SODIUM-DEXTROSE 2-4 GM/100ML-% IV SOLN
2.0000 g | Freq: Four times a day (QID) | INTRAVENOUS | Status: AC
  Administered 2018-04-15 (×2): 2 g via INTRAVENOUS
  Filled 2018-04-15 (×2): qty 100

## 2018-04-15 MED ORDER — PROPOFOL 10 MG/ML IV BOLUS
INTRAVENOUS | Status: AC
  Filled 2018-04-15: qty 20

## 2018-04-15 MED ORDER — ACETAMINOPHEN 325 MG PO TABS: 325.0000 mg | ORAL_TABLET | Freq: Four times a day (QID) | ORAL | Status: DC | PRN

## 2018-04-15 MED ORDER — HYDROCODONE-ACETAMINOPHEN 7.5-325 MG PO TABS: 1.0000 | ORAL_TABLET | ORAL | Status: DC | PRN

## 2018-04-15 MED ORDER — KETOROLAC TROMETHAMINE 30 MG/ML IJ SOLN
INTRAMUSCULAR | Status: DC | PRN
  Administered 2018-04-15: 30 mg via INTRA_ARTICULAR

## 2018-04-15 MED ORDER — SENNOSIDES-DOCUSATE SODIUM 8.6-50 MG PO TABS
2.0000 | ORAL_TABLET | Freq: Every evening | ORAL | Status: DC
  Administered 2018-04-15 – 2018-04-17 (×3): 2 via ORAL
  Filled 2018-04-15 (×3): qty 2

## 2018-04-15 MED ORDER — ROCURONIUM BROMIDE 50 MG/5ML IV SOSY
PREFILLED_SYRINGE | INTRAVENOUS | Status: DC | PRN
  Administered 2018-04-15: 50 mg via INTRAVENOUS

## 2018-04-15 MED ORDER — TIZANIDINE HCL 4 MG PO TABS
4.0000 mg | ORAL_TABLET | Freq: Three times a day (TID) | ORAL | Status: DC | PRN
  Administered 2018-04-15 – 2018-04-18 (×5): 4 mg via ORAL
  Filled 2018-04-15 (×5): qty 1

## 2018-04-15 MED ORDER — EPHEDRINE 5 MG/ML INJ
INTRAVENOUS | Status: AC
  Filled 2018-04-15: qty 20

## 2018-04-15 MED ORDER — PHENOL 1.4 % MT LIQD: 1.0000 | OROMUCOSAL | Status: DC | PRN

## 2018-04-15 MED ORDER — SODIUM CHLORIDE 0.9 % IV SOLN
INTRAVENOUS | Status: DC | PRN
  Administered 2018-04-15: 60 ug/min via INTRAVENOUS

## 2018-04-15 MED ORDER — LIDOCAINE 2% (20 MG/ML) 5 ML SYRINGE
INTRAMUSCULAR | Status: AC
  Filled 2018-04-15: qty 5

## 2018-04-15 MED ORDER — LACTATED RINGERS IV SOLN
INTRAVENOUS | Status: DC | PRN
  Administered 2018-04-15: 11:00:00 via INTRAVENOUS

## 2018-04-15 MED ORDER — PHENYLEPHRINE 40 MCG/ML (10ML) SYRINGE FOR IV PUSH (FOR BLOOD PRESSURE SUPPORT)
PREFILLED_SYRINGE | INTRAVENOUS | Status: AC
  Filled 2018-04-15: qty 10

## 2018-04-15 MED ORDER — METOCLOPRAMIDE HCL 5 MG/ML IJ SOLN: 5.0000 mg | Freq: Three times a day (TID) | INTRAMUSCULAR | Status: DC | PRN

## 2018-04-15 MED ORDER — ONDANSETRON HCL 4 MG/2ML IJ SOLN
INTRAMUSCULAR | Status: DC | PRN
  Administered 2018-04-15: 4 mg via INTRAVENOUS

## 2018-04-15 MED ORDER — EPHEDRINE SULFATE-NACL 50-0.9 MG/10ML-% IV SOSY
PREFILLED_SYRINGE | INTRAVENOUS | Status: DC | PRN
  Administered 2018-04-15 (×2): 10 mg via INTRAVENOUS
  Administered 2018-04-15: 5 mg via INTRAVENOUS
  Administered 2018-04-15: 10 mg via INTRAVENOUS

## 2018-04-15 MED ORDER — CEFAZOLIN SODIUM-DEXTROSE 2-4 GM/100ML-% IV SOLN
2.0000 g | INTRAVENOUS | Status: AC
  Administered 2018-04-15: 2 g via INTRAVENOUS

## 2018-04-15 MED ORDER — BUPIVACAINE-EPINEPHRINE (PF) 0.5% -1:200000 IJ SOLN
INTRAMUSCULAR | Status: AC
  Filled 2018-04-15: qty 30

## 2018-04-15 MED ORDER — MIDAZOLAM HCL 2 MG/2ML IJ SOLN
INTRAMUSCULAR | Status: AC
  Filled 2018-04-15: qty 2

## 2018-04-15 MED ORDER — ONDANSETRON HCL 4 MG/2ML IJ SOLN
INTRAMUSCULAR | Status: AC
  Filled 2018-04-15: qty 2

## 2018-04-15 MED ORDER — DEXAMETHASONE SODIUM PHOSPHATE 10 MG/ML IJ SOLN
INTRAMUSCULAR | Status: DC | PRN
  Administered 2018-04-15: 8 mg via INTRAVENOUS

## 2018-04-15 MED ORDER — SODIUM CHLORIDE 0.9 % IR SOLN
Status: DC | PRN
  Administered 2018-04-15: 3000 mL

## 2018-04-15 MED ORDER — PROPOFOL 10 MG/ML IV BOLUS
INTRAVENOUS | Status: DC | PRN
  Administered 2018-04-15: 120 mg via INTRAVENOUS

## 2018-04-15 MED ORDER — ASPIRIN 81 MG PO CHEW
81.0000 mg | CHEWABLE_TABLET | Freq: Two times a day (BID) | ORAL | Status: DC
  Administered 2018-04-15 – 2018-04-20 (×10): 81 mg via ORAL
  Filled 2018-04-15 (×10): qty 1

## 2018-04-15 MED ORDER — FENTANYL CITRATE (PF) 100 MCG/2ML IJ SOLN
INTRAMUSCULAR | Status: DC | PRN
  Administered 2018-04-15 (×4): 50 ug via INTRAVENOUS

## 2018-04-15 MED ORDER — MENTHOL 3 MG MT LOZG: 1.0000 | LOZENGE | OROMUCOSAL | Status: DC | PRN

## 2018-04-15 MED ORDER — FENTANYL CITRATE (PF) 100 MCG/2ML IJ SOLN: 25.0000 ug | INTRAMUSCULAR | Status: DC | PRN

## 2018-04-15 MED ORDER — NIFEDIPINE ER OSMOTIC RELEASE 30 MG PO TB24
30.0000 mg | ORAL_TABLET | Freq: Every day | ORAL | Status: DC
  Administered 2018-04-15 – 2018-04-19 (×5): 30 mg via ORAL
  Filled 2018-04-15 (×5): qty 1

## 2018-04-15 MED ORDER — BUPIVACAINE-EPINEPHRINE (PF) 0.5% -1:200000 IJ SOLN
INTRAMUSCULAR | Status: DC | PRN
  Administered 2018-04-15: 30 mL

## 2018-04-15 MED ORDER — SPIRONOLACTONE 50 MG PO TABS
50.0000 mg | ORAL_TABLET | Freq: Every day | ORAL | Status: DC
  Administered 2018-04-15: 50 mg via ORAL
  Filled 2018-04-15 (×2): qty 1

## 2018-04-15 MED ORDER — DOCUSATE SODIUM 100 MG PO CAPS
100.0000 mg | ORAL_CAPSULE | Freq: Two times a day (BID) | ORAL | Status: DC
  Administered 2018-04-15 – 2018-04-18 (×7): 100 mg via ORAL
  Filled 2018-04-15 (×7): qty 1

## 2018-04-15 MED ORDER — PHENYLEPHRINE 40 MCG/ML (10ML) SYRINGE FOR IV PUSH (FOR BLOOD PRESSURE SUPPORT)
PREFILLED_SYRINGE | INTRAVENOUS | Status: DC | PRN
  Administered 2018-04-15 (×2): 80 ug via INTRAVENOUS

## 2018-04-15 MED ORDER — HYDROCODONE-ACETAMINOPHEN 5-325 MG PO TABS: 1.0000 | ORAL_TABLET | ORAL | Status: DC | PRN

## 2018-04-15 SURGICAL SUPPLY — 54 items
BLADE CLIPPER SURG (BLADE) ×2 IMPLANT
CHLORAPREP W/TINT 26ML (MISCELLANEOUS) ×2 IMPLANT
COVER SURGICAL LIGHT HANDLE (MISCELLANEOUS) ×2 IMPLANT
COVER WAND RF STERILE (DRAPES) IMPLANT
DERMABOND ADVANCED (GAUZE/BANDAGES/DRESSINGS) ×1
DERMABOND ADVANCED .7 DNX12 (GAUZE/BANDAGES/DRESSINGS) ×1 IMPLANT
DRAPE C-ARM 42X72 X-RAY (DRAPES) ×2 IMPLANT
DRAPE IMP U-DRAPE 54X76 (DRAPES) ×4 IMPLANT
DRAPE STERI IOBAN 125X83 (DRAPES) ×2 IMPLANT
DRAPE U-SHAPE 47X51 STRL (DRAPES) ×6 IMPLANT
DRSG AQUACEL AG ADV 3.5X10 (GAUZE/BANDAGES/DRESSINGS) ×2 IMPLANT
ELECT BLADE 4.0 EZ CLEAN MEGAD (MISCELLANEOUS) ×2
ELECT REM PT RETURN 9FT ADLT (ELECTROSURGICAL) ×2
ELECTRODE BLDE 4.0 EZ CLN MEGD (MISCELLANEOUS) ×1 IMPLANT
ELECTRODE REM PT RTRN 9FT ADLT (ELECTROSURGICAL) ×1 IMPLANT
EVACUATOR 1/8 PVC DRAIN (DRAIN) IMPLANT
GLOVE BIO SURGEON STRL SZ8.5 (GLOVE) ×8 IMPLANT
GLOVE BIOGEL PI IND STRL 8.5 (GLOVE) ×1 IMPLANT
GLOVE BIOGEL PI INDICATOR 8.5 (GLOVE) ×1
GOWN STRL REUS W/ TWL LRG LVL3 (GOWN DISPOSABLE) ×3 IMPLANT
GOWN STRL REUS W/TWL 2XL LVL3 (GOWN DISPOSABLE) ×4 IMPLANT
GOWN STRL REUS W/TWL LRG LVL3 (GOWN DISPOSABLE) ×3
HANDPIECE INTERPULSE COAX TIP (DISPOSABLE) ×1
HEAD FEM UNIPOLAR 48 OD (Hips) ×2 IMPLANT
HOOD PEEL AWAY FLYTE STAYCOOL (MISCELLANEOUS) ×4 IMPLANT
KIT BASIN OR (CUSTOM PROCEDURE TRAY) ×2 IMPLANT
KIT TURNOVER KIT B (KITS) ×2 IMPLANT
MANIFOLD NEPTUNE II (INSTRUMENTS) ×2 IMPLANT
MARKER SKIN DUAL TIP RULER LAB (MISCELLANEOUS) ×2 IMPLANT
NEEDLE SPNL 18GX3.5 QUINCKE PK (NEEDLE) ×2 IMPLANT
NS IRRIG 1000ML POUR BTL (IV SOLUTION) ×2 IMPLANT
PACK TOTAL JOINT (CUSTOM PROCEDURE TRAY) ×2 IMPLANT
PACK UNIVERSAL I (CUSTOM PROCEDURE TRAY) ×2 IMPLANT
PAD ARMBOARD 7.5X6 YLW CONV (MISCELLANEOUS) ×4 IMPLANT
SAW OSC TIP CART 19.5X105X1.3 (SAW) ×2 IMPLANT
SEALER BIPOLAR AQUA 6.0 (INSTRUMENTS) ×2 IMPLANT
SET HNDPC FAN SPRY TIP SCT (DISPOSABLE) ×1 IMPLANT
SPACER FEM TAPERED +0 12/14 (Hips) ×2 IMPLANT
STEM TRI LOC GRIPTION SZ 2 STD ×1 IMPLANT
SUCTION FRAZIER HANDLE 10FR (MISCELLANEOUS)
SUCTION TUBE FRAZIER 10FR DISP (MISCELLANEOUS) IMPLANT
SUT ETHIBOND NAB CT1 #1 30IN (SUTURE) ×4 IMPLANT
SUT MNCRL AB 3-0 PS2 18 (SUTURE) ×2 IMPLANT
SUT MON AB 2-0 CT1 36 (SUTURE) ×2 IMPLANT
SUT VIC AB 1 CT1 27 (SUTURE) ×2
SUT VIC AB 1 CT1 27XBRD ANBCTR (SUTURE) ×2 IMPLANT
SUT VIC AB 2-0 CT1 27 (SUTURE) ×1
SUT VIC AB 2-0 CT1 TAPERPNT 27 (SUTURE) ×1 IMPLANT
SUT VLOC 180 0 24IN GS25 (SUTURE) ×2 IMPLANT
SYR 50ML LL SCALE MARK (SYRINGE) ×2 IMPLANT
TOWEL OR 17X24 6PK STRL BLUE (TOWEL DISPOSABLE) ×2 IMPLANT
TOWEL OR 17X26 10 PK STRL BLUE (TOWEL DISPOSABLE) ×2 IMPLANT
TRAY FOLEY CATH SILVER 16FR (SET/KITS/TRAYS/PACK) IMPLANT
TRI LOC GRIPTION SZ 2 STD ×2 IMPLANT

## 2018-04-15 NOTE — Consult Note (Addendum)
Cardiology Consultation:   Patient ID: Howard Jackson; [id number]; [birth year]   Admit date: 04/14/2018 Date of Consult: 04/15/2018  Primary Care Provider: [facility] [place] Primary Cardiologist: Sanda Klein, MD Primary Electrophysiologist:  None   Patient Profile:   Howard Jackson is a 72 y.o. male with a PMH of CAD s/p multiple PCI/DES with 5 stents in the RCA, LCx, and LAD (most recently 10/2009), HTN with benign pheochromocytoma, HLD, PAD, CVA, who is being seen today for the evaluation of preoperative risk assessment at the request of Dr. Horris Latino.  History of Present Illness:   Howard Jackson was in his usual state of health until he experienced a mechanical fall 04/15/18 resulting in a left comminuted and impacted hip fracture. He was ambulating, with the assistance of a cane, [place] the [place] when his ankle rolled and he fell onto his left elbow/hip.   He was last seen by cardiology, Dr. Sallyanne Kuster 02/2017 and was felt to be doing well from a cardiac standpoint. His blood pressure was well controlled. He had no anginal complaints. No medication changes occurred at this visit. Last ischemic evaluation was a nuclear stress test in 2014 which was normal.   He lives alone and is able to perform ADLs. He states he is fairly limited in mobility by residual left sided weakness after CVA and low back pain. He is unable to walk a block or go up a flight of stairs. He uses a cane for ambulation but states he was instructed to use a wheelchair. He reports some orthopnea but on further questioning, he states he has a dry cough related to asthma history which prompts him to sleep more upright. He denies any chest pain at rest or with exertion, SOB, DOE, significant LE edema, PND, weight gain, or syncope.   Hospital course: VSS. Labs notable for Na 133, K 5.2, Cr 1.28, CBC wnl. EKG with sinus rhythm with significant artifact, no STE/D, no TWI.  CXR without acute  findings. CT L hip with acute comminuted and impacted fracture of the left femoral neck. Patient was admitted to medicine. Orthopedics planning for left hip fracture repair today pending cardiology evaluation for preoperative risk assessment.   Past Medical History:  Diagnosis Date  . Adrenal gland disorder ([place])    tumor present - no change-  no recent increase    . Arthritis   . Benign pheochromocytoma of right adrenal gland 06/23/2014   Never had histological diagnosis, but reportedly initially diagnosed in 1989  . Bunion    left foot  . Complication of anesthesia    "died on the table during cervical fusion" '89 - believed to be d/t "too much medication" to treat HTN and was later dx with pheochromocytoma  . Coronary artery disease    cleared for surg. by Dr. Gwenlyn Found - [facility]  . H/O hiatal hernia   . Hammer toe   . Herniated lumbar intervertebral disc   . History of anxiety   . Hyperlipidemia   . Hypertension   . Neurogenic bladder   . Neuromuscular disorder ([place])    carpal tunnel problem, even after surgery release   . Peripheral arterial disease ([place])   . Primary aldosteronism ([place])   . Stroke ([place])    in [place], L sided weakness, wears a brace on L leg & uses cane    Past Surgical History:  Procedure Laterality Date  . CARPAL TUNNEL RELEASE     bilateral   .  CERVICAL FUSION    . CORONARY ANGIOPLASTY WITH STENT PLACEMENT     pt reports having 5 stents in his heart-2011  . HERNIA REPAIR    . INGUINAL HERNIA REPAIR Left 11/18/2012   Procedure: HERNIA REPAIR INGUINAL ADULT;  Surgeon: Harl Bowie, MD;  Location: [facility];  Service: General;  Laterality: Left;  . INSERTION OF MESH Left 11/18/2012   Procedure: INSERTION OF MESH;  Surgeon: Harl Bowie, MD;  Location: [facility];  Service: General;  Laterality: Left;     Home Medications:  Prior to Admission medications   Medication Sig Start Date End Date Taking? Authorizing Provider  amoxicillin (AMOXIL) 875 MG tablet  Take 875 mg by mouth 2 (two) times daily. 03/31/18  Yes [provider]  clopidogrel (PLAVIX) 75 MG tablet Take 1 tablet (75 mg total) by mouth daily. 03/05/17  Yes Croitoru, Mihai, MD  finasteride (PROSCAR) 5 MG tablet Take 5 mg by mouth daily. 01/19/18  Yes [provider]  FLOVENT HFA 110 MCG/ACT inhaler Inhale 1 puff into the lungs 3 (three) times daily.  04/13/14  Yes [provider]  labetalol (NORMODYNE) 300 MG tablet TAKE 1 TABLET (300 MG TOTAL) BY MOUTH 2 (TWO) TIMES DAILY. 03/17/18  Yes Croitoru, Mihai, MD  losartan-hydrochlorothiazide (HYZAAR) 100-25 MG tablet TOME UNA TABLETA TODOS LOS DIAS Patient taking differently: Take 1 tablet by mouth daily.  09/16/16  Yes Dorena Dew, FNP  meloxicam (MOBIC) 7.5 MG tablet Take 1 tablet (7.5 mg total) by mouth as needed for pain. 07/10/16  Yes Duffy Bruce, MD  Methylsulfonylmethane (MSM) 1000 MG TABS Take 500 mg by mouth daily.   Yes [provider]  Misc Natural Products (COLON CARE PO) Take 2 capsules by mouth every evening.    Yes [provider]  NIFEdipine (PROCARDIA-XL/ADALAT CC) 30 MG 24 hr tablet Take 1 tablet (30 mg) by mouth every evening. Use other prescription (60mg ) in morning only. 06/15/17  Yes Croitoru, Mihai, MD  NIFEdipine (PROCARDIA-XL/ADALAT CC) 60 MG 24 hr tablet Take 1 tablet (60 mg) by mouth every morning. Use other prescription (30mg ) in evening only. Patient taking differently: Take 60 mg by mouth daily.  06/15/17  Yes Croitoru, Mihai, MD  pyridOXINE (VITAMIN B-6) 100 MG tablet Take 100 mg by mouth daily.   Yes [provider]  sennosides-docusate sodium (SENOKOT-S) 8.6-50 MG tablet Take 2 tablets by mouth every evening.    Yes [provider]  simvastatin (ZOCOR) 40 MG tablet Take 1 tablet (40 mg total) by mouth every evening. 08/25/16  Yes Dorena Dew, FNP  spironolactone (ALDACTONE) 25 MG tablet Take 1 tablet (25 mg total) by mouth daily. Patient taking  differently: Take 100 mg by mouth daily.  05/13/17  Yes Croitoru, Mihai, MD  tiZANidine (ZANAFLEX) 4 MG tablet TAKE 1-2 TABLETS (4-8 MG TOTAL) BY MOUTH AT BEDTIME AS NEEDED. FOR MUSCLE SPASMS Patient taking differently: Take 4-8 mg by mouth at bedtime as needed for muscle spasms.  11/17/16  Yes Dorena Dew, FNP  tamsulosin (FLOMAX) 0.4 MG CAPS capsule Take 1 capsule (0.4 mg total) by mouth daily. Patient not taking: Reported on 04/14/2018 01/09/17   Dorena Dew, FNP  triamcinolone cream (KENALOG) 0.1 % Apply topically 2 (two) times daily. Patient not taking: Reported on 04/14/2018 08/28/16   Dorena Dew, FNP  VENTOLIN HFA 108 (90 Base) MCG/ACT inhaler Inhale 2 puffs into the lungs every 4 (four) hours as needed for wheezing or shortness of  breath. Patient not taking: Reported on 04/14/2018 11/25/16   Dorena Dew, FNP    Inpatient Medications: Scheduled Meds: . budesonide (PULMICORT) nebulizer solution  0.25 mg Nebulization BID  . finasteride  5 mg Oral Daily  . labetalol  300 mg Oral BID  . losartan  100 mg Oral Daily  . NIFEdipine  60 mg Oral Daily   And  . NIFEdipine  30 mg Oral QHS  . pyridOXINE  100 mg Oral Daily  . senna-docusate  2 tablet Oral QPM  . simvastatin  40 mg Oral QPM  . tamsulosin  0.4 mg Oral Daily   Continuous Infusions:  PRN Meds: acetaminophen, hydrALAZINE, morphine injection, ondansetron (ZOFRAN) IV, oxyCODONE-acetaminophen, polyethylene glycol, tiZANidine, zolpidem  Allergies:    Allergies  Allergen Reactions  . Elavil [Amitriptyline Hcl] Hypertension  . Sulfa Antibiotics     Social History:   Social History   Socioeconomic History  . Marital status: Legally Separated    Spouse name: Not on file  . Number of children: Not on file  . Years of education: Not on file  . Highest education level: Not on file  Occupational History  . Not on file  Social Needs  . Financial resource strain: Not on file  . Food insecurity:    Worry: Not  on file    Inability: Not on file  . Transportation needs:    Medical: Not on file    Non-medical: Not on file  Tobacco Use  . Smoking status: Never Smoker  . Smokeless tobacco: Never Used  Substance and Sexual Activity  . Alcohol use: Yes    Comment: social - one drink, on special occasion   . Drug use: No  . Sexual activity: Not on file  Lifestyle  . Physical activity:    Days per week: Not on file    Minutes per session: Not on file  . Stress: Not on file  Relationships  . Social connections:    Talks on phone: Not on file    Gets together: Not on file    Attends religious service: Not on file    Active member of club or organization: Not on file    Attends meetings of clubs or organizations: Not on file    Relationship status: Not on file  . Intimate partner violence:    Fear of current or ex partner: Not on file    Emotionally abused: Not on file    Physically abused: Not on file    Forced sexual activity: Not on file  Other Topics Concern  . Not on file  Social History Narrative  . Not on file    Family History:    Family History  Problem Relation Age of Onset  . CVA Father   . Diabetes Father   . Alzheimer's disease Father   . Hypertension Sister   . Hypertension Brother   . CVA Paternal Grandmother   . Hypertension Brother   . Hypertension Mother      ROS:  Please see the history of present illness.   All other ROS reviewed and negative.     Physical Exam/Data:   Vitals:   04/15/18 0220 04/15/18 0234 04/15/18 0321 04/15/18 0854  BP: (!) 145/80  137/70   Pulse: 80  81   Resp: 19     Temp: 98.5 F (36.9 C)     TempSrc: Oral     SpO2: 100%   99%  Weight:  71.3 kg  Height:  5\' 5"  (1.651 m)      Intake/Output Summary (Last 24 hours) at 04/15/2018 0858 Last data filed at 04/15/2018 0641 Gross per 24 hour  Intake 0 ml  Output 550 ml  Net -550 ml   Filed Weights   04/15/18 0234  Weight: 71.3 kg   Body mass index is 26.16 kg/m.    General:  Elderly gentleman laying in bed in no acute distress HEENT: sclera anicteric  Neck: no JVD Vascular: No carotid bruits; distal pulses 2+ bilaterally Cardiac:  normal S1, S2; RRR; no murmurs, rubs, or gallops Lungs:  clear to auscultation bilaterally, no wheezing, rhonchi or rales  Abd: NABS, soft, nontender, no hepatomegaly Ext: no edema Musculoskeletal:  LUE is contracted Skin: warm and dry  Neuro:  CNs 2-12 intact, no focal abnormalities noted Psych:  Normal affect   EKG:  The EKG was personally reviewed and demonstrates:  Sinus rhythm with artifact; no STE/D, no TWI.  Telemetry:  Telemetry was personally reviewed and demonstrates:  Sinus rhythm with 1st degree AV block  Relevant CV Studies: NST 2014: Overall Impression:  Normal stress nuclear study.  LV Wall Motion:  NL LV Function; NL Wall Motion  Laboratory Data:  Chemistry Recent Labs  Lab 04/14/18 2147  NA 133*  K 5.2*  CL 102  CO2 20*  GLUCOSE 133*  BUN 28*  CREATININE 1.28*  CALCIUM 9.5  GFRNONAA 53*  GFRAA >60  ANIONGAP 11    No results for input(s): PROT, ALBUMIN, AST, ALT, ALKPHOS, BILITOT in the last 168 hours. Hematology Recent Labs  Lab 04/14/18 2147  WBC 9.9  RBC 4.29  HGB 14.4  HCT 43.4  MCV 101.2*  MCH 33.6  MCHC 33.2  RDW 12.5  PLT 163   Cardiac EnzymesNo results for input(s): TROPONINI in the last 168 hours. No results for input(s): TROPIPOC in the last 168 hours.  BNPNo results for input(s): BNP, PROBNP in the last 168 hours.  DDimer No results for input(s): DDIMER in the last 168 hours.  Radiology/Studies:  Dg Elbow 2 Views Left  Result Date: 04/14/2018 CLINICAL DATA:  Pain after fall. EXAM: LEFT ELBOW - 2 VIEW COMPARISON:  None. FINDINGS: The lateral view is limited due to over flexion. Mild degenerative changes. No obvious joint effusion. No fractures identified. IMPRESSION: The study is limited due to positioning but no obvious fracture or effusion noted.  Electronically Signed   By: Dorise Bullion III M.D   On: 04/14/2018 19:05   Ct Head Wo Contrast  Result Date: 04/14/2018 CLINICAL DATA:  Fall onto pavement EXAM: CT HEAD WITHOUT CONTRAST CT CERVICAL SPINE WITHOUT CONTRAST TECHNIQUE: Multidetector CT imaging of the head and cervical spine was performed following the standard protocol without intravenous contrast. Multiplanar CT image reconstructions of the cervical spine were also generated. COMPARISON:  CT head dated 10/02/2017 FINDINGS: CT HEAD FINDINGS Brain: No evidence of acute infarction, hemorrhage, hydrocephalus, extra-axial collection or mass lesion/mass effect. Encephalomalacic changes related to old right basal ganglia infarct. Ex vacuo dilatation of the right lateral ventricle. Mild subcortical white matter and periventricular small vessel ischemic changes. Vascular: Intracranial atherosclerosis. Skull: Normal. Negative for fracture or focal lesion. Sinuses/Orbits: The visualized paranasal sinuses are essentially clear. The mastoid air cells are unopacified. Other: None. CT CERVICAL SPINE FINDINGS Alignment: Mild straightening of the cervical spine. Skull base and vertebrae: No acute fracture. No primary bone lesion or focal pathologic process. Soft tissues and spinal canal: No prevertebral fluid or swelling. No visible canal  hematoma. Disc levels: Partial fusion at C5-6. Mild multilevel degenerative changes. Spinal canal is patent. Upper chest: Visualized lung apices are clear. Other: 11 mm right thyroid nodule IMPRESSION: No evidence of acute intracranial abnormality. Old right basal ganglia infarct. Mild small vessel ischemic changes. No evidence of traumatic injury to the cervical spine. Mild degenerative changes. Electronically Signed   By: Julian Hy M.D.   On: 04/14/2018 21:29   Ct Cervical Spine Wo Contrast  Result Date: 04/14/2018 CLINICAL DATA:  Fall onto pavement EXAM: CT HEAD WITHOUT CONTRAST CT CERVICAL SPINE WITHOUT CONTRAST  TECHNIQUE: Multidetector CT imaging of the head and cervical spine was performed following the standard protocol without intravenous contrast. Multiplanar CT image reconstructions of the cervical spine were also generated. COMPARISON:  CT head dated 10/02/2017 FINDINGS: CT HEAD FINDINGS Brain: No evidence of acute infarction, hemorrhage, hydrocephalus, extra-axial collection or mass lesion/mass effect. Encephalomalacic changes related to old right basal ganglia infarct. Ex vacuo dilatation of the right lateral ventricle. Mild subcortical white matter and periventricular small vessel ischemic changes. Vascular: Intracranial atherosclerosis. Skull: Normal. Negative for fracture or focal lesion. Sinuses/Orbits: The visualized paranasal sinuses are essentially clear. The mastoid air cells are unopacified. Other: None. CT CERVICAL SPINE FINDINGS Alignment: Mild straightening of the cervical spine. Skull base and vertebrae: No acute fracture. No primary bone lesion or focal pathologic process. Soft tissues and spinal canal: No prevertebral fluid or swelling. No visible canal hematoma. Disc levels: Partial fusion at C5-6. Mild multilevel degenerative changes. Spinal canal is patent. Upper chest: Visualized lung apices are clear. Other: 11 mm right thyroid nodule IMPRESSION: No evidence of acute intracranial abnormality. Old right basal ganglia infarct. Mild small vessel ischemic changes. No evidence of traumatic injury to the cervical spine. Mild degenerative changes. Electronically Signed   By: Julian Hy M.D.   On: 04/14/2018 21:29   Ct Hip Left Wo Contrast  Result Date: 04/14/2018 CLINICAL DATA:  Hip fracture EXAM: CT OF THE LEFT HIP WITHOUT CONTRAST TECHNIQUE: Multidetector CT imaging of the left hip was performed according to the standard protocol. Multiplanar CT image reconstructions were also generated. COMPARISON:  Radiograph 04/14/2018 FINDINGS: Bones/Joint/Cartilage Left femoral head projects in joint.  Acute mildly comminuted and impacted fracture involving the left femoral neck. Fracture lucency is present within the subcapital left femur with fracture lucency extending to the base of the femoral neck. No fracture lucency identified within the greater or lesser trochanter. No femoral shaft fracture lucency. Ligaments Suboptimally assessed by CT. Muscles and Tendons No significant intramuscular hematoma.  No muscular atrophy. Soft tissues Vascular calcifications. Soft tissue thickening in the left inguinal canal. Small right fat containing inguinal hernia. Edema surrounding the left hip. IMPRESSION: Acute mildly comminuted and impacted fracture involving the left femoral neck which appears to involve the subcapital region with extension of fracture lucency to the base of the femoral neck. No fracture lucency identified within the trochanters or the proximal shaft of the femur. Femoral head is normally positioned. Electronically Signed   By: Donavan Foil M.D.   On: 04/14/2018 23:08   Dg Chest Portable 1 View  Result Date: 04/14/2018 CLINICAL DATA:  72 y/o  M; fall, preop. EXAM: PORTABLE CHEST 1 VIEW COMPARISON:  07/10/2018 chest radiograph. FINDINGS: Stable heart size and mediastinal contours are within normal limits. Both lungs are clear. Severe osteoarthrosis of left knee no humeral joint, partially visualized. No acute fracture identified. IMPRESSION: No active disease. Electronically Signed   By: Kristine Garbe M.D.   On:  04/14/2018 23:37   Dg Hip Unilat With Pelvis 2-3 Views Left  Result Date: 04/14/2018 CLINICAL DATA:  Pain after fall EXAM: DG HIP (WITH OR WITHOUT PELVIS) 2-3V LEFT COMPARISON:  None. FINDINGS: There is a comminuted fracture through the left femoral neck with mild displacement. IMPRESSION: Comminuted fracture through the left femoral neck. Electronically Signed   By: Dorise Bullion III M.D   On: 04/14/2018 19:06    Assessment and Plan:   1. Preoperative risk  assessment: patient presented after a mechanical fall and suffered a left hip fracture. Planned for repair with orthopedics today. Patient lives independently and is able to perform ADLs. He is limited in mobility by residual left sided weakness after CVA and low back pain and is unable to perform 4 METS. He has a history of CAD with multiple PCI/DES to LAD, RCA, and LCx int he past (last 2011). His last ischemic evaluation was a NST in 2014 which was normal. He denies anginal or volume overload complaints. He has no history of DM or significant CKD. EKG this admission is sinus rhythm with 1st degree AV block, no ischemic changes, no significant change from previous. - Based on the revised cardiac risk index for preoperative risk assessment, this patient has a score of 2 (history of CAD and CVA), with a 10.1% 30-day risk of adverse cardiac events.  - No further cardiac work-up at this time given need for urgent left hip fracture repair - Plavix on hold for procedure - Will need to monitor BP closely in the perioperative setting given pheochromocytoma. On alpha and beta blockade with tamsulosin and labetalol  - He reports complications with surgery in the past with spironolactone being held - unclear relationship. Given mildly elevated hyperkalemia, will restart spironolactone at half of his home dose - 50mg  daily.   2. HTN with history of benign pheochromocytoma: BP well controlled this admission on nifedipine, labetalol, and losartan. Continued on tamsulosin for alpha blockade. Home HCTZ held in anticipation of procedure, and spironolactone held given mild hyperkalemia.  - Continue current regimen - Given mildly elevated hyperkalemia, will restart spironolactone at half of his home dose - 50mg  daily - in an effort to avoid complications.   3. CAD s/p multiple PCI/DES to LAD, RCA, and LCx (last 2011): He has not had any anginal complaints since that time. He has been maintained on plavix, primarily given  CVA history. Not on aspirin given intermittent NSAID use in an effort to minimize bleeding complications. Last ischemic evaluation was a NST 2014 which was normal - Resume plavix when cleared by surgery team - Continue statin  4. History of CVA with residual left sided deficits: no new neurologic complaints. Home plavix held in anticipation of surgery - Resume plavix when cleared by surgery team - Continue statin  5. HLD: lipids followed by PCP - Continue statin  For questions or updates, please contact [facility] Please consult [email or web address] for contact info under Cardiology/STEMI.   Signed, Abigail Butts, PA-C  04/15/2018 8:58 AM [phone]  The patient was seen, examined and discussed with Abigail Butts, PA-C  and I agree with the above.   72 y.o. male with a PMH of CAD s/p multiple PCI/DES with 5 stents in the RCA, LCx, and LAD (most recently 10/2009), HTN with benign pheochromocytoma, HLD, PAD, CVA, who was admitted after a fall complicated by hip fracture, currently scheduled for hip replacement today.   The patient walks with a cane, but can certainly achieve school  activities of daily living and achieve at least 4  METS, he has not had any recent episode of chest pain or shortness of breath, no palpitation dizziness or syncope.  His fall was mechanical. He has been followed by Dr. Orene Desanctis, last seen in August 2018, when he was stable. On physical exam he has no carotid bruit, no JVDs, no murmur, his lungs are clear to auscultation bilaterally, his legs are warm and have good peripheral pulses. His last stress test was in 2014 and was negative for ischemia and his LVEF was 77%. His vitals are well controlled.  EKG shows normal sinus rhythm with first-degree AV block otherwise normal and unchanged from prior EKG in 2017.  There is currently no contraindication from cardiac standpoint for this patient to undergo his hip replacement.  I would continue his current meds  including spironolactone that he is taking for pheochromocytoma, he reports prior exacerbation of pheochromocytoma in the settings of health spironolactone during surgery.  We will decrease the dose to 50 mg daily for the purposes of surgery and increase to 100 mg daily post surgery. We will follow.  Ena Dawley, MD 04/15/2018

## 2018-04-15 NOTE — Anesthesia Preprocedure Evaluation (Addendum)
Anesthesia Evaluation  Patient identified by MRN, date of birth, ID band Patient awake    Reviewed: Allergy & Precautions, NPO status , Patient's Chart, lab work & pertinent test results  Airway Mallampati: II  TM Distance: >3 FB Neck ROM: Full    Dental  (+) Teeth Intact, Dental Advisory Given   Pulmonary asthma ,    breath sounds clear to auscultation       Cardiovascular hypertension, Pt. on home beta blockers and Pt. on medications + CAD and + Peripheral Vascular Disease   Rhythm:Regular Rate:Normal     Neuro/Psych CVA, Residual Symptoms    GI/Hepatic Neg liver ROS, hiatal hernia,   Endo/Other  negative endocrine ROS  Renal/GU negative Renal ROS     Musculoskeletal  (+) Arthritis ,   Abdominal (+) + obese,   Peds  Hematology negative hematology ROS (+)   Anesthesia Other Findings   Reproductive/Obstetrics                            Anesthesia Physical Anesthesia Plan  ASA: III  Anesthesia Plan: General   Post-op Pain Management:    Induction: Intravenous  PONV Risk Score and Plan: 3 and Ondansetron, Dexamethasone and Midazolam  Airway Management Planned: Oral ETT  Additional Equipment: None  Intra-op Plan:   Post-operative Plan: Extubation in OR  Informed Consent: I have reviewed the patients History and Physical, chart, labs and discussed the procedure including the risks, benefits and alternatives for the proposed anesthesia with the patient or authorized representative who has indicated his/her understanding and acceptance.   Dental advisory given  Plan Discussed with: CRNA  Anesthesia Plan Comments:        Lab Results  Component Value Date   WBC 9.9 04/14/2018   HGB 14.4 04/14/2018   HCT 43.4 04/14/2018   MCV 101.2 (H) 04/14/2018   PLT 163 04/14/2018   EKG:    Anesthesia Quick Evaluation

## 2018-04-15 NOTE — Consult Note (Signed)
Reason for Consult:Left hip fracture Referring Physician: Dr. Curlene Dolphin is an 72 y.o. male.  HPI: Pateitn presents to [facility] following a fall yesterday. He was getting out of his care and started walking through the parking lot, when his ankle turned cause him to fall onto his left side. He reported immediate left elbow and hip pain. Imaging revealed a left commuted hip fracture and no elbow fracture.He has been admitted to medicine and cardiology [facility] heart and vascular) has been consulted for clearance related to pheochromocytoma and stroke history. He is on Plavix which is being held at this time. He is a Patient for Dr.Steve Veverly Fells for his shoulder. He lives independently at home and despite his stroke related hemiparesis he ambulates with a cane only. Denies Syncope, CP, SOB, or other areas of pain.  Past Medical History:  Diagnosis Date  . Adrenal gland disorder ([place])    tumor present - no change-  no recent increase    . Arthritis   . Benign pheochromocytoma of right adrenal gland 06/23/2014   Never had histological diagnosis, but reportedly initially diagnosed in 1989  . Bunion    left foot  . Complication of anesthesia    "died on the table during cervical fusion" '89 - believed to be d/t "too much medication" to treat HTN and was later dx with pheochromocytoma  . Coronary artery disease    cleared for surg. by Dr. Gwenlyn Found - [facility]  . H/O hiatal hernia   . Hammer toe   . Herniated lumbar intervertebral disc   . History of anxiety   . Hyperlipidemia   . Hypertension   . Neurogenic bladder   . Neuromuscular disorder ([place])    carpal tunnel problem, even after surgery release   . Peripheral arterial disease ([place])   . Primary aldosteronism ([place])   . Stroke ([place])    in [place], L sided weakness, wears a brace on L leg & uses cane    Past Surgical History:  Procedure Laterality Date  . CARPAL TUNNEL RELEASE     bilateral   . CERVICAL FUSION    . CORONARY  ANGIOPLASTY WITH STENT PLACEMENT     pt reports having 5 stents in his heart-2011  . HERNIA REPAIR    . INGUINAL HERNIA REPAIR Left 11/18/2012   Procedure: HERNIA REPAIR INGUINAL ADULT;  Surgeon: Harl Bowie, MD;  Location: [facility];  Service: General;  Laterality: Left;  . INSERTION OF MESH Left 11/18/2012   Procedure: INSERTION OF MESH;  Surgeon: Harl Bowie, MD;  Location: [facility];  Service: General;  Laterality: Left;    Family History  Problem Relation Age of Onset  . CVA Father   . Diabetes Father   . Alzheimer's disease Father   . Hypertension Sister   . Hypertension Brother   . CVA Paternal Grandmother   . Hypertension Brother   . Hypertension Mother     Social History:  reports that he has never smoked. He has never used smokeless tobacco. He reports that he drinks alcohol. He reports that he does not use drugs.  Allergies:  Allergies  Allergen Reactions  . Elavil [Amitriptyline Hcl] Hypertension  . Sulfa Antibiotics     Medications: I have reviewed the patient's current medications.  Results for orders placed or performed during the hospital encounter of 04/14/18 (from the past 48 hour(s))  Basic metabolic panel     Status: Abnormal   Collection Time: 04/14/18  9:47  PM  Result Value Ref Range   Sodium 133 (L) 135 - 145 mmol/L   Potassium 5.2 (H) 3.5 - 5.1 mmol/L   Chloride 102 98 - 111 mmol/L   CO2 20 (L) 22 - 32 mmol/L   Glucose, Bld 133 (H) 70 - 99 mg/dL   BUN 28 (H) 8 - 23 mg/dL   Creatinine, Ser 1.28 (H) 0.61 - 1.24 mg/dL   Calcium 9.5 8.9 - 10.3 mg/dL   GFR calc non Af Amer 53 (L) >60 mL/min   GFR calc Af Amer >60 >60 mL/min    Comment: (NOTE) The eGFR has been calculated using the CKD EPI equation. This calculation has not been validated in all clinical situations. eGFR's persistently <60 mL/min signify possible Chronic Kidney Disease.    Anion gap 11 5 - 15    Comment: Performed at [facility] [address] [place], [place]  [address]  CBC WITH DIFFERENTIAL     Status: Abnormal   Collection Time: 04/14/18  9:47 PM  Result Value Ref Range   WBC 9.9 4.0 - 10.5 K/uL   RBC 4.29 4.22 - 5.81 MIL/uL   Hemoglobin 14.4 13.0 - 17.0 g/dL   HCT 43.4 39.0 - 52.0 %   MCV 101.2 (H) 78.0 - 100.0 fL   MCH 33.6 26.0 - 34.0 pg   MCHC 33.2 30.0 - 36.0 g/dL   RDW 12.5 11.5 - 15.5 %   Platelets 163 150 - 400 K/uL   Neutrophils Relative % 84 %   Neutro Abs 8.3 (H) 1.7 - 7.7 K/uL   Lymphocytes Relative 9 %   Lymphs Abs 0.8 0.7 - 4.0 K/uL   Monocytes Relative 6 %   Monocytes Absolute 0.6 0.1 - 1.0 K/uL   Eosinophils Relative 0 %   Eosinophils Absolute 0.0 0.0 - 0.7 K/uL   Basophils Relative 0 %   Basophils Absolute 0.0 0.0 - 0.1 K/uL   Immature Granulocytes 1 %   Abs Immature Granulocytes 0.1 0.0 - 0.1 K/uL    Comment: Performed at [facility], [address] [place], [place] [address]  Protime-INR     Status: None   Collection Time: 04/14/18  9:47 PM  Result Value Ref Range   Prothrombin Time 14.2 11.4 - 15.2 seconds   INR 1.11     Comment: Performed at [facility] [address] [place], [place] [address]  Type and screen [place]     Status: None   Collection Time: 04/14/18  9:47 PM  Result Value Ref Range   ABO/RH(D) O POS    Antibody Screen NEG    Sample Expiration      04/17/2018 Performed at [facility], [place] [address], [place], [place] [address]   ABO/Rh     Status: None   Collection Time: 04/14/18  9:47 PM  Result Value Ref Range   ABO/RH(D)      O POS Performed at [facility] [address], [place],  [address]   Surgical pcr screen     Status: None   Collection Time: 04/15/18  3:15 AM  Result Value Ref Range   MRSA, PCR NEGATIVE NEGATIVE   Staphylococcus aureus NEGATIVE NEGATIVE    Comment: (NOTE) The Xpert SA Assay (FDA approved for NASAL specimens in patients 8 years of age and older), is one component of a comprehensive surveillance  program. It is not intended to diagnose infection nor to  guide or monitor treatment. Performed at [facility], [place] [address] [place], [place] [address]   APTT     Status: Abnormal   Collection Time: 04/15/18  4:56 AM  Result Value Ref Range   aPTT 38 (H) 24 - 36 seconds    Comment:        IF BASELINE aPTT IS ELEVATED, SUGGEST PATIENT RISK ASSESSMENT BE USED TO DETERMINE APPROPRIATE ANTICOAGULANT THERAPY. Performed at [facility], [place] [address], [place], [place] [address]     Dg Elbow 2 Views Left  Result Date: 04/14/2018 CLINICAL DATA:  Pain after fall. EXAM: LEFT ELBOW - 2 VIEW COMPARISON:  None. FINDINGS: The lateral view is limited due to over flexion. Mild degenerative changes. No obvious joint effusion. No fractures identified. IMPRESSION: The study is limited due to positioning but no obvious fracture or effusion noted. Electronically Signed   By: Dorise Bullion III M.D   On: 04/14/2018 19:05   Ct Head Wo Contrast  Result Date: 04/14/2018 CLINICAL DATA:  Fall onto pavement EXAM: CT HEAD WITHOUT CONTRAST CT CERVICAL SPINE WITHOUT CONTRAST TECHNIQUE: Multidetector CT imaging of the head and cervical spine was performed following the standard protocol without intravenous contrast. Multiplanar CT image reconstructions of the cervical spine were also generated. COMPARISON:  CT head dated 10/02/2017 FINDINGS: CT HEAD FINDINGS Brain: No evidence of acute infarction, hemorrhage, hydrocephalus, extra-axial collection or mass lesion/mass effect. Encephalomalacic changes related to old right basal ganglia infarct. Ex vacuo dilatation of the right lateral ventricle. Mild subcortical white matter and periventricular small vessel ischemic changes. Vascular: Intracranial atherosclerosis. Skull: Normal. Negative for fracture or focal lesion. Sinuses/Orbits: The visualized paranasal sinuses are essentially clear. The mastoid air cells are unopacified. Other: None. CT CERVICAL SPINE  FINDINGS Alignment: Mild straightening of the cervical spine. Skull base and vertebrae: No acute fracture. No primary bone lesion or focal pathologic process. Soft tissues and spinal canal: No prevertebral fluid or swelling. No visible canal hematoma. Disc levels: Partial fusion at C5-6. Mild multilevel degenerative changes. Spinal canal is patent. Upper chest: Visualized lung apices are clear. Other: 11 mm right thyroid nodule IMPRESSION: No evidence of acute intracranial abnormality. Old right basal ganglia infarct. Mild small vessel ischemic changes. No evidence of traumatic injury to the cervical spine. Mild degenerative changes. Electronically Signed   By: Julian Hy M.D.   On: 04/14/2018 21:29   Ct Cervical Spine Wo Contrast  Result Date: 04/14/2018 CLINICAL DATA:  Fall onto pavement EXAM: CT HEAD WITHOUT CONTRAST CT CERVICAL SPINE WITHOUT CONTRAST TECHNIQUE: Multidetector CT imaging of the head and cervical spine was performed following the standard protocol without intravenous contrast. Multiplanar CT image reconstructions of the cervical spine were also generated. COMPARISON:  CT head dated 10/02/2017 FINDINGS: CT HEAD FINDINGS Brain: No evidence of acute infarction, hemorrhage, hydrocephalus, extra-axial collection or mass lesion/mass effect. Encephalomalacic changes related to old right basal ganglia infarct. Ex vacuo dilatation of the right lateral ventricle. Mild subcortical white matter and periventricular small vessel ischemic changes. Vascular: Intracranial atherosclerosis. Skull: Normal. Negative for fracture or focal lesion. Sinuses/Orbits: The visualized paranasal sinuses are essentially clear. The mastoid air cells are unopacified. Other: None. CT CERVICAL SPINE FINDINGS Alignment: Mild straightening of the cervical spine. Skull base and vertebrae: No acute fracture. No primary bone lesion or focal pathologic process. Soft tissues and spinal canal: No prevertebral fluid or swelling. No  visible canal hematoma. Disc levels: Partial fusion at C5-6. Mild multilevel degenerative changes. Spinal canal is  patent. Upper chest: Visualized lung apices are clear. Other: 11 mm right thyroid nodule IMPRESSION: No evidence of acute intracranial abnormality. Old right basal ganglia infarct. Mild small vessel ischemic changes. No evidence of traumatic injury to the cervical spine. Mild degenerative changes. Electronically Signed   By: Julian Hy M.D.   On: 04/14/2018 21:29   Ct Hip Left Wo Contrast  Result Date: 04/14/2018 CLINICAL DATA:  Hip fracture EXAM: CT OF THE LEFT HIP WITHOUT CONTRAST TECHNIQUE: Multidetector CT imaging of the left hip was performed according to the standard protocol. Multiplanar CT image reconstructions were also generated. COMPARISON:  Radiograph 04/14/2018 FINDINGS: Bones/Joint/Cartilage Left femoral head projects in joint. Acute mildly comminuted and impacted fracture involving the left femoral neck. Fracture lucency is present within the subcapital left femur with fracture lucency extending to the base of the femoral neck. No fracture lucency identified within the greater or lesser trochanter. No femoral shaft fracture lucency. Ligaments Suboptimally assessed by CT. Muscles and Tendons No significant intramuscular hematoma.  No muscular atrophy. Soft tissues Vascular calcifications. Soft tissue thickening in the left inguinal canal. Small right fat containing inguinal hernia. Edema surrounding the left hip. IMPRESSION: Acute mildly comminuted and impacted fracture involving the left femoral neck which appears to involve the subcapital region with extension of fracture lucency to the base of the femoral neck. No fracture lucency identified within the trochanters or the proximal shaft of the femur. Femoral head is normally positioned. Electronically Signed   By: Donavan Foil M.D.   On: 04/14/2018 23:08   Dg Chest Portable 1 View  Result Date: 04/14/2018 CLINICAL DATA:   72 y/o  M; fall, preop. EXAM: PORTABLE CHEST 1 VIEW COMPARISON:  07/10/2018 chest radiograph. FINDINGS: Stable heart size and mediastinal contours are within normal limits. Both lungs are clear. Severe osteoarthrosis of left knee no humeral joint, partially visualized. No acute fracture identified. IMPRESSION: No active disease. Electronically Signed   By: Kristine Garbe M.D.   On: 04/14/2018 23:37   Dg Hip Unilat With Pelvis 2-3 Views Left  Result Date: 04/14/2018 CLINICAL DATA:  Pain after fall EXAM: DG HIP (WITH OR WITHOUT PELVIS) 2-3V LEFT COMPARISON:  None. FINDINGS: There is a comminuted fracture through the left femoral neck with mild displacement. IMPRESSION: Comminuted fracture through the left femoral neck. Electronically Signed   By: Dorise Bullion III M.D   On: 04/14/2018 19:06    Review of Systems  Constitutional: Negative.   HENT: Negative.   Eyes: Negative.   Respiratory: Negative for shortness of breath.   Cardiovascular: Negative.  Negative for chest pain.  Gastrointestinal: Negative.   Genitourinary: Negative.   Musculoskeletal: Positive for joint pain.  Skin: Negative.   Neurological: Positive for weakness.  Endo/Heme/Allergies: Negative.   Psychiatric/Behavioral: Negative.    Blood pressure 137/70, pulse 81, temperature 98.5 F (36.9 C), temperature source Oral, resp. rate 19, height _0  (1.651 m), weight 71.3 kg, SpO2 100 %. Alert and oriented x3. RRR, Lungs clear, BS x4. Left Calf soft and non tender. No DVT signs. No signs of infection or compartment syndrome. LLE grossly neurovascularly intact. Limited mobility to Left upper and lower extremities. Pain with minimal ROM of left hip.  Abrasion to left elbow.    Assessment/Plan: Left hip fracture: NPO  Bedrest  Pain managemnt Hold plavix Surgical intervention by Dr. Lyla Glassing pending Cardiac clearance for pheochromocytoma. Admitted to medicine Patient and family updated and discussed surgery in  detail. Risk and benefits and pre-op, intra-op, and post-op  care were apart of this disscusion. They are in agreement with the plan.   Left elbow abrasion: Dressing applied Negative Xray Monitor      Jaselyn Nahm L 04/15/2018, 7:54 AM

## 2018-04-15 NOTE — Consult Note (Signed)
[facility] Nurse wound consult note Reason for Consult:left elbow partial thickness skin tear Wound type: trauma Pressure Injury POA: NA Measurement:3cm x 1.5cm x 0.1cm Wound PZW:CHEN Drainage (amount, consistency, odor) none Periwound: intact Dressing procedure/placement/frequency:I have provided nurses with orders for vaseline gauze and Foam Dressing for protection. We will not follow, but will remain available to this patient, to nursing, and the medical and/or surgical teams.  Please re-consult if we need to assist further.  Fara Olden, RN-C, WTA-C, [facility] Treatment Associate Ostomy Care Associate

## 2018-04-15 NOTE — Progress Notes (Signed)
CSW received SNF consult as patient lives alone. CSW provided family members with list of SNF's in [place] area, family will have top 3 options by tomorrow afternoon. Meeting with family around 12/1 to discuss and begin insurance authorization for smooth transition at discharge.   [place], [place]

## 2018-04-15 NOTE — Transfer of Care (Signed)
Immediate Anesthesia Transfer of Care Note  Patient: Howard Jackson  Procedure(s) Performed: ANTERIOR APPROACH HEMI HIP ARTHROPLASTY (Left Hip)  Patient Location: PACU  Anesthesia Type:General  Level of Consciousness: sedated and drowsy  Airway & Oxygen Therapy: Patient Spontanous Breathing and Patient connected to face mask oxygen  Post-op Assessment: Report given to RN and Post -op Vital signs reviewed and stable  Post vital signs: Reviewed and stable  Last Vitals:  Vitals Value Taken Time  BP 97/48 04/15/2018  1:15 PM  Temp    Pulse 32 04/15/2018  1:17 PM  Resp 14 04/15/2018  1:17 PM  SpO2 100 % 04/15/2018  1:17 PM  Vitals shown include unvalidated device data.  Last Pain:  Vitals:   04/15/18 0959  TempSrc: Oral  PainSc:          Complications: No apparent anesthesia complications

## 2018-04-15 NOTE — Progress Notes (Signed)
PROGRESS NOTE  Howard Jackson WRU:[id number] DOB: Feb 07, 1946 DOA: 04/14/2018 PCP: [facility] @ [place]  HPI/Recap of past 24 hours: HPI from Dr Blaine Hamper Howard Jackson is a 72 y.o. male with medical history significant of benign adrenal pheochromocytoma, primary aldosteronism, hypertension, hyperlipidemia, CVA with residual left-sided hemiparesis, anxiety, CAD, PVD, BPH, HTN, HLD who presents with left hip pain s/p mechanical fall. Developed a small skin tear on his L elbow as well. X-ray showed comminuted left femoral fracture.  X-ray of left elbow is limited study, but did not show fracture. CT head is negative for acute intracranial abnormalities.  CT of C-spine no bony fracture. Ortho consulted. Pt admitted for further management.   Today, pt reported controlled pain, denies any new complaints, chest pain, SOB, abdominal pain, fever/chills.  Assessment/Plan: Principal Problem:   Fracture of femoral neck, left, closed ([place]) Active Problems:   HTN (hypertension)   History of stroke   CAD (coronary artery disease)   Dyslipidemia   Benign pheochromocytoma of right adrenal gland   BPH (benign prostatic hyperplasia)   Fall   Closed displaced fracture of left femoral neck (H[facility])  Fracture of left femoral neck Due to mechanical fall X-ray hip showed commuted fracture through the left femoral neck Orthopedics on board, for surgery today Pain management, DVT ppx per Ortho PT/OT once approved by Ortho  History of benign right adrenal pheochromocytoma/HTN BP controlled Cardiology consulted to assist in perioperative management, appreciate recs Continue losartan, labetalol, nifedipine, reduce spironolactone to 50 mg due to hyperkalemia, held hydrochlorothiazide Monitor closely  History of CVA with residual left-sided deficits Hold Plavix for surgery Continue statins  CAD status post multiple DES Chest pain-free Continue statins, Plavix on hold for  surgery  Hyperlipidemia Continue statins  BPH Continue Flomax, Proscar     Code Status: Full  Family Communication: None at bedside  Disposition Plan: To be determined post surgery, likely SNF   Consultants:  Orthopedics  Procedures:  None  Antimicrobials:  None  DVT prophylaxis: SCDs   Objective: Vitals:   04/15/18 0234 04/15/18 0321 04/15/18 0854 04/15/18 0959  BP:  137/70  (!) 148/78  Pulse:  81  72  Resp:    18  Temp:    98.4 F (36.9 C)  TempSrc:    Oral  SpO2:   99% 99%  Weight: 71.3 kg     Height: 5\' 5"  (1.651 m)       Intake/Output Summary (Last 24 hours) at 04/15/2018 1245 Last data filed at 04/15/2018 1215 Gross per 24 hour  Intake 0 ml  Output 975 ml  Net -975 ml   Filed Weights   04/15/18 0234  Weight: 71.3 kg    Exam:   General: NAD  Cardiovascular: S1, S2 present  Respiratory: CTA B  Abdomen: Soft, nontender, nondistended, bowel sounds present  Musculoskeletal: TTP around the left hip, no edema noted   Skin: Small skin tear in the left elbow  Psychiatry: Normal mood   Data Reviewed: CBC: Recent Labs  Lab 04/14/18 2147  WBC 9.9  NEUTROABS 8.3*  HGB 14.4  HCT 43.4  MCV 101.2*  PLT 914   Basic Metabolic Panel: Recent Labs  Lab 04/14/18 2147 04/15/18 0946  NA 133* 133*  K 5.2* 4.2  CL 102 103  CO2 20* 20*  GLUCOSE 133* 99  BUN 28* 23  CREATININE 1.28* 1.15  CALCIUM 9.5 8.6*   GFR: Estimated Creatinine Clearance: 49 mL/min (by C-G formula based on SCr  of 1.15 mg/dL). Liver Function Tests: No results for input(s): AST, ALT, ALKPHOS, BILITOT, PROT, ALBUMIN in the last 168 hours. No results for input(s): LIPASE, AMYLASE in the last 168 hours. No results for input(s): AMMONIA in the last 168 hours. Coagulation Profile: Recent Labs  Lab 04/14/18 2147  INR 1.11   Cardiac Enzymes: No results for input(s): CKTOTAL, CKMB, CKMBINDEX, TROPONINI in the last 168 hours. BNP (last 3 results) No results for  input(s): PROBNP in the last 8760 hours. HbA1C: No results for input(s): HGBA1C in the last 72 hours. CBG: No results for input(s): GLUCAP in the last 168 hours. Lipid Profile: No results for input(s): CHOL, HDL, LDLCALC, TRIG, CHOLHDL, LDLDIRECT in the last 72 hours. Thyroid Function Tests: No results for input(s): TSH, T4TOTAL, FREET4, T3FREE, THYROIDAB in the last 72 hours. Anemia Panel: No results for input(s): VITAMINB12, FOLATE, FERRITIN, TIBC, IRON, RETICCTPCT in the last 72 hours. Urine analysis:    Component Value Date/Time   COLORURINE STRAW (A) 07/10/2016 1937   APPEARANCEUR CLEAR 07/10/2016 1937   LABSPEC 1.020 11/25/2016 1448   PHURINE 7.0 11/25/2016 1448   GLUCOSEU NEGATIVE 11/25/2016 1448   HGBUR NEGATIVE 11/25/2016 1448   BILIRUBINUR NEGATIVE 11/25/2016 1448   KETONESUR NEGATIVE 11/25/2016 1448   PROTEINUR NEGATIVE 11/25/2016 1448   UROBILINOGEN 0.2 11/25/2016 1448   NITRITE NEGATIVE 11/25/2016 1448   LEUKOCYTESUR NEGATIVE 11/25/2016 1448   Sepsis Labs: @LABRCNTIP (procalcitonin:4,lacticidven:4)  ) Recent Results (from the past 240 hour(s))  Surgical pcr screen     Status: None   Collection Time: 04/15/18  3:15 AM  Result Value Ref Range Status   MRSA, PCR NEGATIVE NEGATIVE Final   Staphylococcus aureus NEGATIVE NEGATIVE Final    Comment: (NOTE) The Xpert SA Assay (FDA approved for NASAL specimens in patients 82 years of age and older), is one component of a comprehensive surveillance program. It is not intended to diagnose infection nor to guide or monitor treatment. Performed at [facility], [place] [address] [place], [place] [address]       Studies: Dg Elbow 2 Views Left  Result Date: 04/14/2018 CLINICAL DATA:  Pain after fall. EXAM: LEFT ELBOW - 2 VIEW COMPARISON:  None. FINDINGS: The lateral view is limited due to over flexion. Mild degenerative changes. No obvious joint effusion. No fractures identified. IMPRESSION: The study is limited due  to positioning but no obvious fracture or effusion noted. Electronically Signed   By: Dorise Bullion III M.D   On: 04/14/2018 19:05   Ct Head Wo Contrast  Result Date: 04/14/2018 CLINICAL DATA:  Fall onto pavement EXAM: CT HEAD WITHOUT CONTRAST CT CERVICAL SPINE WITHOUT CONTRAST TECHNIQUE: Multidetector CT imaging of the head and cervical spine was performed following the standard protocol without intravenous contrast. Multiplanar CT image reconstructions of the cervical spine were also generated. COMPARISON:  CT head dated 10/02/2017 FINDINGS: CT HEAD FINDINGS Brain: No evidence of acute infarction, hemorrhage, hydrocephalus, extra-axial collection or mass lesion/mass effect. Encephalomalacic changes related to old right basal ganglia infarct. Ex vacuo dilatation of the right lateral ventricle. Mild subcortical white matter and periventricular small vessel ischemic changes. Vascular: Intracranial atherosclerosis. Skull: Normal. Negative for fracture or focal lesion. Sinuses/Orbits: The visualized paranasal sinuses are essentially clear. The mastoid air cells are unopacified. Other: None. CT CERVICAL SPINE FINDINGS Alignment: Mild straightening of the cervical spine. Skull base and vertebrae: No acute fracture. No primary bone lesion or focal pathologic process. Soft tissues and spinal canal: No prevertebral fluid or swelling. No  visible canal hematoma. Disc levels: Partial fusion at C5-6. Mild multilevel degenerative changes. Spinal canal is patent. Upper chest: Visualized lung apices are clear. Other: 11 mm right thyroid nodule IMPRESSION: No evidence of acute intracranial abnormality. Old right basal ganglia infarct. Mild small vessel ischemic changes. No evidence of traumatic injury to the cervical spine. Mild degenerative changes. Electronically Signed   By: Julian Hy M.D.   On: 04/14/2018 21:29   Ct Cervical Spine Wo Contrast  Result Date: 04/14/2018 CLINICAL DATA:  Fall onto pavement EXAM: CT  HEAD WITHOUT CONTRAST CT CERVICAL SPINE WITHOUT CONTRAST TECHNIQUE: Multidetector CT imaging of the head and cervical spine was performed following the standard protocol without intravenous contrast. Multiplanar CT image reconstructions of the cervical spine were also generated. COMPARISON:  CT head dated 10/02/2017 FINDINGS: CT HEAD FINDINGS Brain: No evidence of acute infarction, hemorrhage, hydrocephalus, extra-axial collection or mass lesion/mass effect. Encephalomalacic changes related to old right basal ganglia infarct. Ex vacuo dilatation of the right lateral ventricle. Mild subcortical white matter and periventricular small vessel ischemic changes. Vascular: Intracranial atherosclerosis. Skull: Normal. Negative for fracture or focal lesion. Sinuses/Orbits: The visualized paranasal sinuses are essentially clear. The mastoid air cells are unopacified. Other: None. CT CERVICAL SPINE FINDINGS Alignment: Mild straightening of the cervical spine. Skull base and vertebrae: No acute fracture. No primary bone lesion or focal pathologic process. Soft tissues and spinal canal: No prevertebral fluid or swelling. No visible canal hematoma. Disc levels: Partial fusion at C5-6. Mild multilevel degenerative changes. Spinal canal is patent. Upper chest: Visualized lung apices are clear. Other: 11 mm right thyroid nodule IMPRESSION: No evidence of acute intracranial abnormality. Old right basal ganglia infarct. Mild small vessel ischemic changes. No evidence of traumatic injury to the cervical spine. Mild degenerative changes. Electronically Signed   By: Julian Hy M.D.   On: 04/14/2018 21:29   Ct Hip Left Wo Contrast  Result Date: 04/14/2018 CLINICAL DATA:  Hip fracture EXAM: CT OF THE LEFT HIP WITHOUT CONTRAST TECHNIQUE: Multidetector CT imaging of the left hip was performed according to the standard protocol. Multiplanar CT image reconstructions were also generated. COMPARISON:  Radiograph 04/14/2018 FINDINGS:  Bones/Joint/Cartilage Left femoral head projects in joint. Acute mildly comminuted and impacted fracture involving the left femoral neck. Fracture lucency is present within the subcapital left femur with fracture lucency extending to the base of the femoral neck. No fracture lucency identified within the greater or lesser trochanter. No femoral shaft fracture lucency. Ligaments Suboptimally assessed by CT. Muscles and Tendons No significant intramuscular hematoma.  No muscular atrophy. Soft tissues Vascular calcifications. Soft tissue thickening in the left inguinal canal. Small right fat containing inguinal hernia. Edema surrounding the left hip. IMPRESSION: Acute mildly comminuted and impacted fracture involving the left femoral neck which appears to involve the subcapital region with extension of fracture lucency to the base of the femoral neck. No fracture lucency identified within the trochanters or the proximal shaft of the femur. Femoral head is normally positioned. Electronically Signed   By: Donavan Foil M.D.   On: 04/14/2018 23:08   Dg Chest Portable 1 View  Result Date: 04/14/2018 CLINICAL DATA:  72 y/o  M; fall, preop. EXAM: PORTABLE CHEST 1 VIEW COMPARISON:  07/10/2018 chest radiograph. FINDINGS: Stable heart size and mediastinal contours are within normal limits. Both lungs are clear. Severe osteoarthrosis of left knee no humeral joint, partially visualized. No acute fracture identified. IMPRESSION: No active disease. Electronically Signed   By: Edgardo Roys.D.  On: 04/14/2018 23:37   Dg Hip Unilat With Pelvis 2-3 Views Left  Result Date: 04/14/2018 CLINICAL DATA:  Pain after fall EXAM: DG HIP (WITH OR WITHOUT PELVIS) 2-3V LEFT COMPARISON:  None. FINDINGS: There is a comminuted fracture through the left femoral neck with mild displacement. IMPRESSION: Comminuted fracture through the left femoral neck. Electronically Signed   By: Dorise Bullion III M.D   On: 04/14/2018 19:06     Scheduled Meds: . [MAR Hold] budesonide (PULMICORT) nebulizer solution  0.25 mg Nebulization BID  . [MAR Hold] finasteride  5 mg Oral Daily  . [MAR Hold] labetalol  300 mg Oral BID  . [MAR Hold] losartan  100 mg Oral Daily  . [MAR Hold] NIFEdipine  60 mg Oral Daily   And  . [MAR Hold] NIFEdipine  30 mg Oral QHS  . povidone-iodine  2 application Topical Once  . [MAR Hold] pyridOXINE  100 mg Oral Daily  . [MAR Hold] senna-docusate  2 tablet Oral QPM  . [MAR Hold] simvastatin  40 mg Oral QPM  . spironolactone  50 mg Oral Daily  . [MAR Hold] tamsulosin  0.4 mg Oral Daily    Continuous Infusions: . acetaminophen    . lactated ringers 10 mL/hr at 04/15/18 1048     LOS: 1 day     Alma Friendly, MD [facility]   If 7PM-7AM, please contact night-coverage [email or web address] 04/15/2018, 12:45 PM

## 2018-04-15 NOTE — ED Notes (Signed)
Pt  BED HAS BEEN CHANGED TO A TELE BED

## 2018-04-15 NOTE — Discharge Instructions (Signed)
°Dr. Sharrell Krawiec °Joint Replacement Specialist °[facility] °[address] °[place], [place] [address] °[phone] ° ° °TOTAL HIP REPLACEMENT POSTOPERATIVE DIRECTIONS ° ° ° °Hip Rehabilitation, Guidelines Following Surgery  ° °WEIGHT BEARING °Weight bearing as tolerated with assist device (walker, cane, etc) as directed, use it as long as suggested by your surgeon or therapist, typically at least 4-6 weeks. ° °The results of a hip operation are greatly improved after range of motion and muscle strengthening exercises. Follow all safety measures which are given to protect your hip. If any of these exercises cause increased pain or swelling in your joint, decrease the amount until you are comfortable again. Then slowly increase the exercises. Call your caregiver if you have problems or questions.  ° °HOME CARE INSTRUCTIONS  °Most of the following instructions are designed to prevent the dislocation of your new hip.  °Remove items at home which could result in a fall. This includes throw rugs or furniture in walking pathways.  °Continue medications as instructed at time of discharge. °· You may have some home medications which will be placed on hold until you complete the course of blood thinner medication. °· You may start showering once you are discharged home. Do not remove your dressing. °Do not put on socks or shoes without following the instructions of your caregivers.   °Sit on chairs with arms. Use the chair arms to help push yourself up when arising.  °Arrange for the use of a toilet seat elevator so you are not sitting low.  °· Walk with walker as instructed.  °You may resume a sexual relationship in one month or when given the OK by your caregiver.  °Use walker as long as suggested by your caregivers.  °You may put full weight on your legs and walk as much as is comfortable. °Avoid periods of inactivity such as sitting longer than an hour when not asleep. This helps prevent  blood clots.  °You may return to work once you are cleared by your surgeon.  °Do not drive a car for 6 weeks or until released by your surgeon.  °Do not drive while taking narcotics.  °Wear elastic stockings for two weeks following surgery during the day but you may remove then at night.  °Make sure you keep all of your appointments after your operation with all of your doctors and caregivers. You should call the office at the above phone number and make an appointment for approximately two weeks after the date of your surgery. °Please pick up a stool softener and laxative for home use as long as you are requiring pain medications. °· ICE to the affected hip every three hours for 30 minutes at a time and then as needed for pain and swelling. Continue to use ice on the hip for pain and swelling from surgery. You may notice swelling that will progress down to the foot and ankle.  This is normal after surgery.  Elevate the leg when you are not up walking on it.   °It is important for you to complete the blood thinner medication as prescribed by your doctor. °· Continue to use the breathing machine which will help keep your temperature down.  It is common for your temperature to cycle up and down following surgery, especially at night when you are not up moving around and exerting yourself.  The breathing machine keeps your lungs expanded and your temperature down. ° °RANGE OF MOTION AND STRENGTHENING EXERCISES  °These exercises are   designed to help you keep full movement of your hip joint. Follow your caregiver's or physical therapist's instructions. Perform all exercises about fifteen times, three times per day or as directed. Exercise both hips, even if you have had only one joint replacement. These exercises can be done on a training (exercise) mat, on the floor, on a table or on a bed. Use whatever works the best and is most comfortable for you. Use music or television while you are exercising so that the exercises  are a pleasant break in your day. This will make your life better with the exercises acting as a break in routine you can look forward to.  °Lying on your back, slowly slide your foot toward your buttocks, raising your knee up off the floor. Then slowly slide your foot back down until your leg is straight again.  °Lying on your back spread your legs as far apart as you can without causing discomfort.  °Lying on your side, raise your upper leg and foot straight up from the floor as far as is comfortable. Slowly lower the leg and repeat.  °Lying on your back, tighten up the muscle in the front of your thigh (quadriceps muscles). You can do this by keeping your leg straight and trying to raise your heel off the floor. This helps strengthen the largest muscle supporting your knee.  °Lying on your back, tighten up the muscles of your buttocks both with the legs straight and with the knee bent at a comfortable angle while keeping your heel on the floor.  ° °SKILLED REHAB INSTRUCTIONS: °If the patient is transferred to a skilled rehab facility following release from the hospital, a list of the current medications will be sent to the facility for the patient to continue.  When discharged from the skilled rehab facility, please have the facility set up the patient's Home Health Physical Therapy prior to being released. Also, the skilled facility will be responsible for providing the patient with their medications at time of release from the facility to include their pain medication and their blood thinner medication. If the patient is still at the rehab facility at time of the two week follow up appointment, the skilled rehab facility will also need to assist the patient in arranging follow up appointment in our office and any transportation needs. ° °MAKE SURE YOU:  °Understand these instructions.  °Will watch your condition.  °Will get help right away if you are not doing well or get worse. ° °Pick up stool softner and  laxative for home use following surgery while on pain medications. °Do not remove your dressing. °The dressing is waterproof--it is OK to take showers. °Continue to use ice for pain and swelling after surgery. °Do not use any lotions or creams on the incision until instructed by your surgeon. °Total Hip Protocol. ° ° °

## 2018-04-15 NOTE — Op Note (Signed)
OPERATIVE REPORT  SURGEON: Rod Can, MD   ASSISTANT: April Green, RNFA.  PREOPERATIVE DIAGNOSIS: Displaced Left femoral neck fracture.   POSTOPERATIVE DIAGNOSIS: Displaced Left femoral neck fracture.   PROCEDURE: Left hip hemiarthroplasty, anterior approach.   IMPLANTS: DePuy Tri Lock stem, size 2, std offset, with a 0 mm spacer and a 48 mm monopolar head ball.  ANESTHESIA:  General  ANTIBIOTICS: 2g ancef.  ESTIMATED BLOOD LOSS:-150 mL    DRAINS: None.  COMPLICATIONS: None   CONDITION: PACU - hemodynamically stable.   BRIEF CLINICAL NOTE: Howard Jackson is a 72 y.o. male with a displaced Left femoral neck fracture. The patient was admitted to the hospitalist service and underwent perioperative risk stratification and medical optimization. The risks, benefits, and alternatives to hemiarthroplasty were explained, and the patient elected to proceed.  PROCEDURE IN DETAIL: The patient was taken to the operating room and general anesthesia was induced on the hospital bed. The patient was then positioned on the Hana table. All bony prominences were well padded. The hip was prepped and draped in the normal sterile surgical fashion. A time-out was called verifying side and site of surgery. Antibiotics were given within 60 minutes of beginning the procedure.  The direct anterior approach to the hip was performed through the Hueter interval. Lateral femoral circumflex vessels were treated with the Auqumantys. The anterior capsule was exposed and an inverted T capsulotomy was made. Fracture hematoma was encountered and evacuated. The patient was found to have a comminuted Left subcapital femoral neck fracture. I freshened the femoral neck cut with a saw. I removed the femoral neck fragment. A corkscrew was placed into the head and the head was removed. This was passed to the back table and was measured.  Acetabular exposure was achieved. I examined the articular  cartilage which was intact. The labrum was intact. A 48 mm trial head was placed and found to have excellent fit.  I then gained femoral exposure taking care to protect the abductors and greater trochanter. This was performed using standard external rotation, extension, and adduction. The capsule was peeled off the inner aspect of the greater trochanter, taking care to preserve the short external rotators. A cookie cutter was used to enter the femoral canal, and then the femoral canal finder was used to confirm location. I then sequentially broached up to a size 2. Calcar planer was used on the femoral neck remnant. I paced a std neck and a 36+1.5 head ball.The hip was reduced. Leg lengths were checked fluoroscopically. The hip was dislocated and trial components were removed. I placed the real stem followed by the real spacer and head ball. A single reduction maneuver was performed and the hip was reduced. Fluoroscopy was used to confirm component position and leg lengths. At 90 degrees of external rotation and extension, the hip was stable to an anterior directed force.  The wound was copiously irrigated with normal saline solution. Marcaine solution was injected into the periarticular soft tissue. The wound was closed in layers using #1 Vicryl and V-Loc for the fascia, 2-0 Vicryl for the subcutaneous fat, 2-0 Monocryl for the deep dermal layer, 3-0 running Monocryl subcuticular stitch and glue for the skin. Once the glue was fully dried, an Aquacell Ag dressing was applied. The patient was then awakened from anesthesia and transported to the recovery room in stable condition. Sponge, needle, and instrument counts were correct at the end of the case x2. The patient tolerated the procedure well and there were no  known complications.  Postoperatively, the patient will be readmitted to the hospitalist service.  He may weight-bear as tolerated with a walker.  We will resume aspirin and  Plavix for DVT prophylaxis.  He will work with physical therapy and undergo disposition planning.  I will plan to see him back for routine postoperative care in 2 weeks.

## 2018-04-15 NOTE — Progress Notes (Signed)
PHARMACIST - PHYSICIAN ORDER COMMUNICATION  CONCERNING: P&T Medication Policy on Herbal Medications  DESCRIPTION:  This patient's order for:  MSM tablets  has been noted.  This product(s) is classified as an "herbal" or natural product. Due to a lack of definitive safety studies or FDA approval, nonstandard manufacturing practices, plus the potential risk of unknown drug-drug interactions while on inpatient medications, the Pharmacy and Therapeutics Committee does not permit the use of "herbal" or natural products of this type within [facility].   ACTION TAKEN: The pharmacy department is unable to verify this order at this time and your patient has been informed of this safety policy. Please reevaluate patient's clinical condition at discharge and address if the herbal or natural product(s) should be resumed at that time.  [place] Crane, Roylene Reason, [facility] Clinical Pharmacist Phone [phone]  04/15/2018 2:08 AM

## 2018-04-15 NOTE — Anesthesia Postprocedure Evaluation (Signed)
Anesthesia Post Note  Patient: Howard Jackson  Procedure(s) Performed: ANTERIOR APPROACH HEMI HIP ARTHROPLASTY (Left Hip)     Patient location during evaluation: PACU Anesthesia Type: General Level of consciousness: awake and alert Pain management: pain level controlled Vital Signs Assessment: post-procedure vital signs reviewed and stable Respiratory status: spontaneous breathing, nonlabored ventilation, respiratory function stable and patient connected to nasal cannula oxygen Cardiovascular status: blood pressure returned to baseline and stable Postop Assessment: no apparent nausea or vomiting Anesthetic complications: no    Last Vitals:  Vitals:   04/15/18 1400 04/15/18 1418  BP: 115/69 115/68  Pulse: 67 66  Resp: 18 17  Temp: (!) 36.3 C   SpO2: 96% 90%    Last Pain:  Vitals:   04/15/18 1400  TempSrc:   PainSc: 0-No pain                 Effie Berkshire

## 2018-04-15 NOTE — Progress Notes (Signed)
Initial Nutrition Assessment  DOCUMENTATION CODES:   Not applicable  INTERVENTION:    Monitor for adequacy of oral intake and add PO supplements as needed.  NUTRITION DIAGNOSIS:   Increased nutrient needs related to post-op healing as evidenced by estimated needs.  GOAL:   Patient will meet greater than or equal to 90% of their needs  MONITOR:   PO intake  REASON FOR ASSESSMENT:   Consult Hip fracture protocol  ASSESSMENT:   72 yo male with PMH of HTN, stroke, CAD, PAD, HLD, benign pheochromocytoma of R adrenal gland, and neurogenic bladder who was admitted on 10/2 with L hip fracture.  Patient out of room for surgery, so RD unable to speak with him. No nutrition problems identified PTA. Currently NPO for surgery.  Labs reviewed. Sodium 133 (L) Medications reviewed and include Aldactone, Flomax.    NUTRITION - FOCUSED PHYSICAL EXAM:  unable to complete NFPE at this time  Diet Order:   Diet Order            Diet NPO time specified Except for: Sips with Meds  Diet effective now              EDUCATION NEEDS:   No education needs have been identified at this time  Skin:  Skin Assessment: Reviewed RN Assessment  Last BM:  10/1  Height:   Ht Readings from Last 1 Encounters:  04/15/18 5\' 5"  (1.651 m)    Weight:   Wt Readings from Last 1 Encounters:  04/15/18 71.3 kg    Ideal Body Weight:  61.8 kg  BMI:  Body mass index is 26.16 kg/m.  Estimated Nutritional Needs:   Kcal:  7195-9747  Protein:  90-105 gm  Fluid:  1.8-2 L    Molli Barrows, RD, LDN, [place] Pager [phone] After Hours Pager [phone]

## 2018-04-15 NOTE — Anesthesia Procedure Notes (Signed)
Procedure Name: Intubation Date/Time: 04/15/2018 11:28 AM Performed by: Orlie Dakin, CRNA Pre-anesthesia Checklist: Patient identified, Emergency Drugs available, Suction available and Patient being monitored Patient Re-evaluated:Patient Re-evaluated prior to induction Oxygen Delivery Method: Circle system utilized Preoxygenation: Pre-oxygenation with 100% oxygen Induction Type: IV induction Ventilation: Mask ventilation without difficulty Laryngoscope Size: Miller and 3 Grade View: Grade I Tube type: Oral Tube size: 7.5 mm Number of attempts: 1 Airway Equipment and Method: Stylet Placement Confirmation: ETT inserted through vocal cords under direct vision,  positive ETCO2 and breath sounds checked- equal and bilateral Secured at: 23 cm Tube secured with: Tape Dental Injury: Teeth and Oropharynx as per pre-operative assessment  Comments: 4x4s bite block used.

## 2018-04-15 NOTE — ED Notes (Signed)
Report called to rn on 5n

## 2018-04-16 ENCOUNTER — Encounter (HOSPITAL_COMMUNITY): Payer: Self-pay | Admitting: Orthopedic Surgery

## 2018-04-16 LAB — CBC WITH DIFFERENTIAL/PLATELET
Abs Immature Granulocytes: 0.1 10*3/uL (ref 0.0–0.1)
BASOS PCT: 0 %
Basophils Absolute: 0 10*3/uL (ref 0.0–0.1)
EOS ABS: 0.1 10*3/uL (ref 0.0–0.7)
Eosinophils Relative: 1 %
HCT: 32.6 % — ABNORMAL LOW (ref 39.0–52.0)
Hemoglobin: 10.7 g/dL — ABNORMAL LOW (ref 13.0–17.0)
Immature Granulocytes: 0 %
Lymphocytes Relative: 11 %
Lymphs Abs: 1.3 10*3/uL (ref 0.7–4.0)
MCH: 33.3 pg (ref 26.0–34.0)
MCHC: 32.8 g/dL (ref 30.0–36.0)
MCV: 101.6 fL — AB (ref 78.0–100.0)
MONOS PCT: 11 %
Monocytes Absolute: 1.3 10*3/uL — ABNORMAL HIGH (ref 0.1–1.0)
Neutro Abs: 9.5 10*3/uL — ABNORMAL HIGH (ref 1.7–7.7)
Neutrophils Relative %: 77 %
PLATELETS: 143 10*3/uL — AB (ref 150–400)
RBC: 3.21 MIL/uL — ABNORMAL LOW (ref 4.22–5.81)
RDW: 12.4 % (ref 11.5–15.5)
WBC: 12.2 10*3/uL — ABNORMAL HIGH (ref 4.0–10.5)

## 2018-04-16 LAB — BASIC METABOLIC PANEL
Anion gap: 12 (ref 5–15)
BUN: 38 mg/dL — ABNORMAL HIGH (ref 8–23)
CALCIUM: 8.2 mg/dL — AB (ref 8.9–10.3)
CO2: 22 mmol/L (ref 22–32)
CREATININE: 1.64 mg/dL — AB (ref 0.61–1.24)
Chloride: 99 mmol/L (ref 98–111)
GFR calc non Af Amer: 40 mL/min — ABNORMAL LOW (ref >60)
GFR, EST AFRICAN AMERICAN: 46 mL/min — AB (ref >60)
GLUCOSE: 111 mg/dL — AB (ref 70–99)
Potassium: 4.7 mmol/L (ref 3.5–5.1)
Sodium: 133 mmol/L — ABNORMAL LOW (ref 135–145)

## 2018-04-16 MED ORDER — HYDROCODONE-ACETAMINOPHEN 5-325 MG PO TABS: 1.0000 | ORAL_TABLET | ORAL | 0 refills | Status: DC | PRN

## 2018-04-16 MED ORDER — SODIUM CHLORIDE 0.9 % IV SOLN
INTRAVENOUS | Status: AC
  Administered 2018-04-16: 13:00:00 via INTRAVENOUS

## 2018-04-16 MED ORDER — ASPIRIN 81 MG PO CHEW: 81.0000 mg | CHEWABLE_TABLET | Freq: Two times a day (BID) | ORAL | 1 refills | Status: DC

## 2018-04-16 MED ORDER — METHOCARBAMOL 500 MG PO TABS
500.0000 mg | ORAL_TABLET | Freq: Four times a day (QID) | ORAL | Status: DC | PRN
  Administered 2018-04-16 – 2018-04-19 (×5): 500 mg via ORAL
  Filled 2018-04-16 (×5): qty 1

## 2018-04-16 MED ORDER — CLOPIDOGREL BISULFATE 75 MG PO TABS
75.0000 mg | ORAL_TABLET | Freq: Every day | ORAL | Status: DC
  Administered 2018-04-16 – 2018-04-20 (×5): 75 mg via ORAL
  Filled 2018-04-16 (×5): qty 1

## 2018-04-16 MED ORDER — BACID PO TABS
2.0000 | ORAL_TABLET | Freq: Two times a day (BID) | ORAL | Status: DC
  Administered 2018-04-16 – 2018-04-20 (×8): 2 via ORAL
  Filled 2018-04-16 (×8): qty 2

## 2018-04-16 MED ORDER — ACETAMINOPHEN-CODEINE #3 300-30 MG PO TABS
1.0000 | ORAL_TABLET | ORAL | Status: DC | PRN
  Administered 2018-04-17 – 2018-04-19 (×5): 2 via ORAL
  Administered 2018-04-20: 1 via ORAL
  Filled 2018-04-16 (×5): qty 2
  Filled 2018-04-16: qty 1
  Filled 2018-04-16: qty 2

## 2018-04-16 NOTE — Evaluation (Signed)
Physical Therapy Evaluation Patient Details Name: Howard Jackson MRN: [id number] DOB: Aug 23, 1945 Today's Date: 04/16/2018   History of Present Illness  72 y.o. male with medical history significant of benign adrenal pheochromocytoma, primary aldosteronism, hypertension, hyperlipidemia, CVA with residual left-sided hemiparesis, anxiety, CAD, PVD, BPH, HTN, HLD who presents with left hip pain s/p mechanical fall. Found to have comminuted left femoral fracture, now s/p Left hip hemiarthroplasty, anterior approach. WBAT post op  Clinical Impression  Pt was able to stand with significant effort from low recliner chair.  He will have a challenging recovery due to PMH of stroke with L hemiperesis.  He reports he is unable to use a L platform RW because his left shoulder is unstable and he cannot put pressure through it.  He was ambulatory with a cane PTA.  He would benefit from post acute rehab before returning home.   PT to follow acutely for deficits listed below.      Follow Up Recommendations SNF    Equipment Recommendations  3in1 (PT)    Recommendations for Other Services   NA    Precautions / Restrictions Precautions Precautions: Fall;Other (comment) Precaution Comments: Pt reports unstable L shoulder, so doesn't believe he could use a platform RW Restrictions Weight Bearing Restrictions: Yes LLE Weight Bearing: Weight bearing as tolerated      Mobility  Bed Mobility               General bed mobility comments: Pt was OOB in the recliner chair and did not want to get back in the bed yet, but needed help to get repositioned comfortably.   Transfers Overall transfer level: Needs assistance Equipment used: None Transfers: Sit to/from Stand Sit to Stand: Mod assist         General transfer comment: Mod assist to help block bil LEs/feet and lift trunk to come to standing and push back into the chair.  Pt able to use his right side on chair armrests to assist significantly  with this move.  Two trials needed to be successful.   Ambulation/Gait             General Gait Details: Unable to attempt safely without two person assist.          Balance Overall balance assessment: Needs assistance Sitting-balance support: Single extremity supported;Feet supported Sitting balance-Leahy Scale: Fair     Standing balance support: Single extremity supported;During functional activity Standing balance-Leahy Scale: Poor Standing balance comment: Needs external support in standing.                              Pertinent Vitals/Pain Pain Assessment: Faces Faces Pain Scale: Hurts whole lot Pain Location: LLE Pain Descriptors / Indicators: Sharp;Shooting;Aching Pain Intervention(s): Limited activity within patient's tolerance;Monitored during session;Repositioned;Patient requesting pain meds-RN notified    Home Living Family/patient expects to be discharged to:: Private residence Living Arrangements: Alone   Type of Home: Apartment Home Access: Level entry;Elevator     Home Layout: One level Home Equipment: Tub bench;Cane - single point      Prior Function Level of Independence: Independent with assistive device(s)         Comments: uses cane, AE, and modifications to complete ADLs 2/2 L hemiparesis     Hand Dominance   Dominant Hand: Right    Extremity/Trunk Assessment   Upper Extremity Assessment Upper Extremity Assessment: Defer to OT evaluation    Lower Extremity Assessment Lower Extremity  Assessment: LLE deficits/detail LLE Deficits / Details: left leg with post op weakness and pain superimposed on L LE weakness from stroke, increased tone vs muscle tightness at knee, ankle, and hip.  Ankle with at least 3-/5, knee 3-/5, and hip 2-/5. Decreased ROM at ankle and knee (knee unable to flex to 90 uless assisted and ankle lacking ~5 degrees from neutral DF with PROM.        Communication   Communication: No difficulties   Cognition Arousal/Alertness: Awake/alert Behavior During Therapy: WFL for tasks assessed/performed Overall Cognitive Status: Within Functional Limits for tasks assessed                                        General Comments General comments (skin integrity, edema, etc.): VSS and checked before mobility.     Exercises Total Joint Exercises Ankle Circles/Pumps: AAROM;Left;10 reps Heel Slides: AAROM;Left;10 reps Hip ABduction/ADduction: AAROM;Left;10 reps   Assessment/Plan    PT Assessment Patient needs continued PT services  PT Problem List Decreased strength;Decreased range of motion;Decreased activity tolerance;Decreased balance;Decreased mobility;Decreased knowledge of use of DME;Pain       PT Treatment Interventions DME instruction;Gait training;Functional mobility training;Therapeutic activities;Therapeutic exercise;Balance training;Patient/family education;Manual techniques;Modalities    PT Goals (Current goals can be found in the Care Plan section)  Acute Rehab PT Goals Patient Stated Goal: rehab then home PT Goal Formulation: With patient Time For Goal Achievement: 04/23/18 Potential to Achieve Goals: Good    Frequency Min 3X/week           AM-PAC PT "6 Clicks" Daily Activity  Outcome Measure Difficulty turning over in bed (including adjusting bedclothes, sheets and blankets)?: Unable Difficulty moving from lying on back to sitting on the side of the bed? : Unable Difficulty sitting down on and standing up from a chair with arms (e.g., wheelchair, bedside commode, etc,.)?: Unable Help needed moving to and from a bed to chair (including a wheelchair)?: A Lot Help needed walking in hospital room?: Total Help needed climbing 3-5 steps with a railing? : Total 6 Click Score: 7    End of Session Equipment Utilized During Treatment: Gait belt Activity Tolerance: Patient limited by pain Patient left: in chair;with call bell/phone within reach Nurse  Communication: Mobility status PT Visit Diagnosis: Muscle weakness (generalized) (M62.81);Difficulty in walking, not elsewhere classified (R26.2);Pain Pain - Right/Left: Left Pain - part of body: Hip    Time: 2706-2376 PT Time Calculation (min) (ACUTE ONLY): 19 min   Charges:           Wells Guiles B. Delwin Raczkowski, PT, DPT  Acute Rehabilitation #[phone] pager #[phone] office   PT Evaluation $PT Eval Moderate Complexity: 1 Mod         04/16/2018, 4:19 PM

## 2018-04-16 NOTE — Progress Notes (Signed)
PROGRESS NOTE  Howard Jackson DJM:[id number] DOB: [birth year] DOA: 04/14/2018 PCP: [facility] [place]  HPI/Recap of past 24 hours: HPI from Dr Blaine Hamper Howard Jackson is a 72 y.o. male with medical history significant of benign adrenal pheochromocytoma, primary aldosteronism, hypertension, hyperlipidemia, CVA with residual left-sided hemiparesis, anxiety, CAD, PVD, BPH, HTN, HLD who presents with left hip pain s/p mechanical fall. Developed a small skin tear on his L elbow as well. X-ray showed comminuted left femoral fracture.  X-ray of left elbow is limited study, but did not show fracture. CT head is negative for acute intracranial abnormalities.  CT of C-spine no bony fracture. Ortho consulted. Pt admitted for further management.   Today, pt post op pain, but refusing all narcotics. Requesting meloxicam, but cant give due to AKI. Pt denies any chest pain, SOB, fever/chills, abdominal pain.  Assessment/Plan: Principal Problem:   Fracture of femoral neck, left, closed ([place]) Active Problems:   HTN (hypertension)   History of stroke   CAD (coronary artery disease)   Dyslipidemia   Benign pheochromocytoma of right adrenal gland   BPH (benign prostatic hyperplasia)   Fall   Closed displaced fracture of left femoral neck ([facility])  Fracture of left femoral neck s/p L hip hemiarthroplasty on 04/15/18 Due to mechanical fall X-ray hip showed commuted fracture through the left femoral neck Orthopedics on board Pain management, DVT ppx per Ortho PT/OT  AKI Start gentle hydration Avoid nephrotoxic Daily BMP  History of benign right adrenal pheochromocytoma/HTN BP controlled Cardiology consulted to assist in perioperative management, appreciate recs Continue labetalol, nifedipine, spironolactone, held hydrochlorothiazide and losartan due to AKI Monitor closely  History of CVA with residual left-sided deficits Continue statins, plavix  CAD status post multiple  DES Chest pain-free Continue statins, Plavix  Hyperlipidemia Continue statins  BPH Continue Flomax, Proscar     Code Status: Full  Family Communication: Daughter at bedside  Disposition Plan: SNF   Consultants:  Orthopedics  Procedures: L hip hemiarthroplasty on 04/15/18  Antimicrobials:  None  DVT prophylaxis: ASA BID + Plavix by Orthopedics   Objective: Vitals:   04/16/18 1300 04/16/18 1303 04/16/18 1313 04/16/18 1431  BP: (!) 119/107 (!) 121/108 (!) 112/56 100/88  Pulse:  64  67  Resp:    16  Temp:    98 F (36.7 C)  TempSrc:    Oral  SpO2:    97%  Weight:      Height:        Intake/Output Summary (Last 24 hours) at 04/16/2018 1512 Last data filed at 04/16/2018 1300 Gross per 24 hour  Intake 0 ml  Output 950 ml  Net -950 ml   Filed Weights   04/15/18 0234  Weight: 71.3 kg    Exam:   General: NAD  Cardiovascular: S1, S2 present  Respiratory: CTA B  Abdomen: Soft, nontender, nondistended, bowel sounds present  Musculoskeletal: TTP around the left hip, no edema noted   Skin: Normal  Psychiatry: Normal mood   Data Reviewed: CBC: Recent Labs  Lab 04/14/18 2147 04/16/18 0642  WBC 9.9 12.2*  NEUTROABS 8.3* 9.5*  HGB 14.4 10.7*  HCT 43.4 32.6*  MCV 101.2* 101.6*  PLT 163 222*   Basic Metabolic Panel: Recent Labs  Lab 04/14/18 2147 04/15/18 0946 04/16/18 0642  NA 133* 133* 133*  K 5.2* 4.2 4.7  CL 102 103 99  CO2 20* 20* 22  GLUCOSE 133* 99 111*  BUN 28* 23 38*  CREATININE 1.28*  1.15 1.64*  CALCIUM 9.5 8.6* 8.2*   GFR: Estimated Creatinine Clearance: 34.4 mL/min (A) (by C-G formula based on SCr of 1.64 mg/dL (H)). Liver Function Tests: No results for input(s): AST, ALT, ALKPHOS, BILITOT, PROT, ALBUMIN in the last 168 hours. No results for input(s): LIPASE, AMYLASE in the last 168 hours. No results for input(s): AMMONIA in the last 168 hours. Coagulation Profile: Recent Labs  Lab 04/14/18 2147  INR 1.11    Cardiac Enzymes: No results for input(s): CKTOTAL, CKMB, CKMBINDEX, TROPONINI in the last 168 hours. BNP (last 3 results) No results for input(s): PROBNP in the last 8760 hours. HbA1C: No results for input(s): HGBA1C in the last 72 hours. CBG: No results for input(s): GLUCAP in the last 168 hours. Lipid Profile: No results for input(s): CHOL, HDL, LDLCALC, TRIG, CHOLHDL, LDLDIRECT in the last 72 hours. Thyroid Function Tests: No results for input(s): TSH, T4TOTAL, FREET4, T3FREE, THYROIDAB in the last 72 hours. Anemia Panel: No results for input(s): VITAMINB12, FOLATE, FERRITIN, TIBC, IRON, RETICCTPCT in the last 72 hours. Urine analysis:    Component Value Date/Time   COLORURINE STRAW (A) 07/10/2016 1937   APPEARANCEUR CLEAR 07/10/2016 1937   LABSPEC 1.020 11/25/2016 1448   PHURINE 7.0 11/25/2016 1448   GLUCOSEU NEGATIVE 11/25/2016 1448   HGBUR NEGATIVE 11/25/2016 1448   BILIRUBINUR NEGATIVE 11/25/2016 1448   KETONESUR NEGATIVE 11/25/2016 1448   PROTEINUR NEGATIVE 11/25/2016 1448   UROBILINOGEN 0.2 11/25/2016 1448   NITRITE NEGATIVE 11/25/2016 1448   LEUKOCYTESUR NEGATIVE 11/25/2016 1448   Sepsis Labs: @LABRCNTIP (procalcitonin:4,lacticidven:4)  ) Recent Results (from the past 240 hour(s))  Surgical pcr screen     Status: None   Collection Time: 04/15/18  3:15 AM  Result Value Ref Range Status   MRSA, PCR NEGATIVE NEGATIVE Final   Staphylococcus aureus NEGATIVE NEGATIVE Final    Comment: (NOTE) The Xpert SA Assay (FDA approved for NASAL specimens in patients 79 years of age and older), is one component of a comprehensive surveillance program. It is not intended to diagnose infection nor to guide or monitor treatment. Performed at [facility], [place] [address], [place], [place] [address]       Studies: No results found.  Scheduled Meds: . aspirin  81 mg Oral BID WC  . budesonide (PULMICORT) nebulizer solution  0.25 mg Nebulization BID  . docusate  sodium  100 mg Oral BID  . finasteride  5 mg Oral Daily  . labetalol  300 mg Oral BID  . NIFEdipine  60 mg Oral Daily   And  . NIFEdipine  30 mg Oral QHS  . pyridOXINE  100 mg Oral Daily  . senna-docusate  2 tablet Oral QPM  . simvastatin  40 mg Oral QPM  . spironolactone  100 mg Oral Daily  . tamsulosin  0.4 mg Oral Daily    Continuous Infusions: . sodium chloride 75 mL/hr at 04/16/18 1302  . lactated ringers 10 mL/hr at 04/15/18 2240     LOS: 2 days     Alma Friendly, MD [facility]   If 7PM-7AM, please contact night-coverage [email or web address] 04/16/2018, 3:12 PM

## 2018-04-16 NOTE — Progress Notes (Signed)
    Subjective:  Patient reports pain as mild to moderate.  Denies N/V/CP/SOB. C/o peri-incisional numbness.  Objective:   VITALS:   Vitals:   04/15/18 1418 04/15/18 1935 04/15/18 2340 04/16/18 0939  BP: 115/68  122/63 106/65  Pulse: 66 73 68 63  Resp: 17 16 (!) 22   Temp:      TempSrc:      SpO2: 90% 96% 100%   Weight:      Height:        NAD ABD soft Sensation intact distally Intact pulses distally Dorsiflexion/Plantar flexion intact Incision: dressing C/D/I Compartment soft LLE chronic weakness from stroke   Lab Results  Component Value Date   WBC 12.2 (H) 04/16/2018   HGB 10.7 (L) 04/16/2018   HCT 32.6 (L) 04/16/2018   MCV 101.6 (H) 04/16/2018   PLT 143 (L) 04/16/2018   BMET    Component Value Date/Time   NA 133 (L) 04/16/2018 0642   K 4.7 04/16/2018 0642   CL 99 04/16/2018 0642   CO2 22 04/16/2018 0642   GLUCOSE 111 (H) 04/16/2018 0642   BUN 38 (H) 04/16/2018 0642   CREATININE 1.64 (H) 04/16/2018 0642   CREATININE 1.13 11/25/2016 1410   CALCIUM 8.2 (L) 04/16/2018 0642   GFRNONAA 40 (L) 04/16/2018 0642   GFRNONAA 64 11/25/2016 1410   GFRAA 46 (L) 04/16/2018 0642   GFRAA 74 11/25/2016 1410     Assessment/Plan: 1 Day Post-Op   Principal Problem:   Fracture of femoral neck, left, closed ([facility]) Active Problems:   HTN (hypertension)   History of stroke   CAD (coronary artery disease)   Dyslipidemia   Benign pheochromocytoma of right adrenal gland   BPH (benign prostatic hyperplasia)   Fall   Closed displaced fracture of left femoral neck ([place])   WBAT with walker DVT ppx: ASA 81mg  PO BID + plavix, SCDs, TEDS PO pain control PT/OT Dispo: D/C planning, D/C when ok with hospitalist   [place] Saphyra Hutt 04/16/2018, 9:47 AM   Rod Can, MD Cell [phone]

## 2018-04-16 NOTE — Evaluation (Signed)
Occupational Therapy Evaluation Patient Details Name: Howard Jackson MRN: [id number] DOB: [birth year] Today's Date: 04/16/2018    History of Present Illness 72 y.o. male with medical history significant of benign adrenal pheochromocytoma, primary aldosteronism, hypertension, hyperlipidemia, CVA with residual left-sided hemiparesis, anxiety, CAD, PVD, BPH, HTN, HLD who presents with left hip pain s/p mechanical fall. Found to have comminuted left femoral fracture, now s/p Left hip hemiarthroplasty, anterior approach.   Clinical Impression   Pt admitted with the above diagnoses and presents with below problem list. Pt will benefit from continued acute OT to address the below listed deficits and maximize independence with basic ADLs prior to d/c to venue below. PTA pt was mod I with ADLs, lives alone, and uses a cane. Pt is currently max A +2 for safety with LB ADLs and stand-pivot transfers, min A with UB ADLs, and mod A with bed mobility. Of note, pt reporting lightheadedness on initial stand from EOB, needing to sit back down. BP assessed 119/107. Pt wanting to transfer to recliner so completed as SPT. Pt then reclined to supine position in chair with blood pressure rechecked about 5 minutes later at 121/108. Nursing notified and present in room for last part of session.       Follow Up Recommendations  SNF    Equipment Recommendations  Other (comment)(defer to next venue)    Recommendations for Other Services PT consult     Precautions / Restrictions Precautions Precautions: Fall Restrictions Weight Bearing Restrictions: Yes LLE Weight Bearing: Weight bearing as tolerated      Mobility Bed Mobility Overal bed mobility: Needs Assistance Bed Mobility: Supine to Sit     Supine to sit: Mod assist;HOB elevated     General bed mobility comments: Assist to power up trunk and advance LLE  Transfers Overall transfer level: Needs assistance Equipment used: 1 person hand held  assist Transfers: Sit to/from [facility] Sit to Stand: Mod assist;From elevated surface Stand pivot transfers: Max assist;+2 safety/equipment;From elevated surface       General transfer comment: Stood from EOB 2x. Lightheaded on 1st standing attempt. Seated rest break then SPT.EOB>recliner going in direction of left side. Therapist standing in front of pt. Pt using therapist and bed railing for external support.     Balance Overall balance assessment: Needs assistance Sitting-balance support: Single extremity supported;Feet supported Sitting balance-Leahy Scale: Fair     Standing balance support: Single extremity supported;During functional activity Standing balance-Leahy Scale: Poor Standing balance comment: uses cane at baseline. needs external support for static standing                           ADL either performed or assessed with clinical judgement   ADL Overall ADL's : Needs assistance/impaired Eating/Feeding: Set up;Sitting   Grooming: Set up;Sitting   Upper Body Bathing: Set up;Sitting;Minimal assistance   Lower Body Bathing: Maximal assistance;+2 for safety/equipment;Sit to/from stand   Upper Body Dressing : Minimal assistance;Sitting   Lower Body Dressing: Maximal assistance;+2 for safety/equipment;Sit to/from stand   Toilet Transfer: Maximal assistance;+2 for safety/equipment;RW   Toileting- Clothing Manipulation and Hygiene: Maximal assistance;+2 for safety/equipment;Sit to/from stand   Tub/ Shower Transfer: Tub transfer;Tub bench;Maximal assistance;+2 for safety/equipment     General ADL Comments: Pt completed bed mobility, sat EOB a few minutes, 2x sit<>stand and 1 SPT.      Vision         Perception     Praxis  Pertinent Vitals/Pain Pain Assessment: 0-10 Pain Score: 9  Pain Location: LLE Pain Descriptors / Indicators: Sharp;Shooting;Aching Pain Intervention(s): Limited activity within patient's  tolerance;Monitored during session;Repositioned     Hand Dominance Right   Extremity/Trunk Assessment Upper Extremity Assessment Upper Extremity Assessment: LUE deficits/detail LUE Deficits / Details: baseline spasticity/contractures throughout LUE with impaired functional use.   Lower Extremity Assessment Lower Extremity Assessment: Defer to PT evaluation       Communication Communication Communication: No difficulties   Cognition Arousal/Alertness: Awake/alert Behavior During Therapy: WFL for tasks assessed/performed Overall Cognitive Status: Within Functional Limits for tasks assessed                                     General Comments  Pt reporting lightheadedness on initial stand from EOB, needing to sit back down. BP assessed 119/107. Pt wanting to transfer to recliner so completed as SPT. Pt then reclined in chair with blood pressure rechecked about 5 minutes later at 121/108. Nursing notified and present in room for last part of session.     Exercises     Shoulder Instructions      Home Living Family/patient expects to be discharged to:: Private residence Living Arrangements: Alone   Type of Home: Apartment Home Access: Level entry;Elevator     Home Layout: One level     Bathroom Shower/Tub: Tub/shower unit         Home Equipment: Tub bench;Cane - single point          Prior Functioning/Environment Level of Independence: Independent with assistive device(s)        Comments: uses cane, AE, and modifications to complete ADLs 2/2 L hemiparesis        OT Problem List: Decreased activity tolerance;Impaired balance (sitting and/or standing);Decreased knowledge of use of DME or AE;Decreased knowledge of precautions;Cardiopulmonary status limiting activity;Pain;Impaired UE functional use      OT Treatment/Interventions: Self-care/ADL training;DME and/or AE instruction;Therapeutic activities;Patient/family education;Balance training     OT Goals(Current goals can be found in the care plan section) Acute Rehab OT Goals Patient Stated Goal: rehab then home OT Goal Formulation: With patient/family Time For Goal Achievement: 04/23/18 Potential to Achieve Goals: Good ADL Goals Pt Will Perform Upper Body Bathing: sitting;with set-up Pt Will Perform Lower Body Bathing: with min assist;sit to/from stand Pt Will Perform Upper Body Dressing: with set-up;sitting Pt Will Perform Lower Body Dressing: with min assist;sit to/from stand Pt Will Transfer to Toilet: with min assist;ambulating Pt Will Perform Toileting - Clothing Manipulation and hygiene: with min assist;sit to/from stand Additional ADL Goal #1: Pt will complete bed mobility at min A level to prepare for OOB ADLs.  OT Frequency: Min 2X/week   Barriers to D/C:            Co-evaluation              AM-PAC PT "6 Clicks" Daily Activity     Outcome Measure Help from another person eating meals?: None Help from another person taking care of personal grooming?: None Help from another person toileting, which includes using toliet, bedpan, or urinal?: Total Help from another person bathing (including washing, rinsing, drying)?: A Lot Help from another person to put on and taking off regular upper body clothing?: A Little Help from another person to put on and taking off regular lower body clothing?: A Lot 6 Click Score: 16   End of Session Equipment Utilized During  Treatment: Gait belt Nurse Communication: Other (comment)(see general comments)  Activity Tolerance: Patient limited by pain;Other (comment)(lightheaded) Patient left: in chair;with call bell/phone within reach;with family/visitor present  OT Visit Diagnosis: Unsteadiness on feet (R26.81);Pain;Hemiplegia and hemiparesis;History of falling (Z91.81);Other abnormalities of gait and mobility (R26.89)(baseline hemiplegia) Hemiplegia - Right/Left: Left Hemiplegia - dominant/non-dominant: Non-Dominant Pain -  Right/Left: Left Pain - part of body: Hip                Time: 1217-1300 OT Time Calculation (min): 43 min Charges:  OT General Charges $OT Visit: 1 Visit OT Evaluation $OT Eval Moderate Complexity: 1 Mod OT Treatments $Self Care/Home Management : 23-37 mins  Tyrone Schimke, OT Acute Rehabilitation Services Pager: [phone] Office: [phone]   Hortencia Pilar 04/16/2018, 1:35 PM

## 2018-04-16 NOTE — Clinical Social Work Note (Signed)
Clinical Social Work Assessment  Patient Details  Name: Howard Jackson MRN: [id number] Date of Birth: [birth year]  Date of referral:  04/16/18               Reason for consult:  Discharge Planning                Permission sought to share information with:  Case Manager, Facility Sport and exercise psychologist, Family Supports Permission granted to share information::  Yes, Verbal Permission Granted  Name::     [place]::  SNFs  Relationship::  daughter  Contact Information:  [phone]  Housing/Transportation Living arrangements for the past 2 months:  Single Family Home Source of Information:  Patient Patient Interpreter Needed:  None Criminal Activity/Legal Involvement Pertinent to Current Situation/Hospitalization:  No - Comment as needed Significant Relationships:  Adult Children Lives with:  Self Do you feel safe going back to the place where you live?  No Need for family participation in patient care:  Yes (Comment)  Care giving concerns:  CSW received referral for possible SNF placement at time of discharge. Spoke with patient regarding possibility of SNF placement . Patient's family is currently unable to care for him at their home given patient's current needs and fall risk. Patient previously living alone before hospital stay.  Patient and  Daughter expressed understanding of PT recommendation and are agreeable to SNF placement at time of discharge. CSW to continue to follow and assist with discharge planning needs.     Social Worker assessment / plan:  Spoke with patient and daughter  concerning possibility of rehab at [facility] before returning home.    Employment status:  Retired Nurse, adult PT Recommendations:  [place] / Referral to community resources:  [place]  Patient/Family's Response to care:  Patient and   daughter  recognize need for rehab before returning home and are agreeable to a SNF  in [place]. They report preference for   [facility] on [address], second choice of [facility], and third choice of [facility] . CSW explained insurance authorization process. Patient's family reported that they want patient to get stronger to be able to come back home.    Patient/Family's Understanding of and Emotional Response to Diagnosis, Current Treatment, and Prognosis:  Patient/family is realistic regarding therapy needs and expressed being hopeful for SNF placement. Patient expressed understanding of CSW role and discharge process as well as medical condition. No questions/concerns about plan or treatment.    Emotional Assessment Appearance:  Appears stated age Attitude/Demeanor/Rapport:  Gracious, Self-Confident Affect (typically observed):  Accepting Orientation:  Oriented to Self, Oriented to Place, Oriented to  Time, Oriented to Situation Alcohol / Substance use:  Not Applicable Psych involvement (Current and /or in the community):  No (Comment)  Discharge Needs  Concerns to be addressed:  Discharge Planning Concerns Readmission within the last 30 days:  No Current discharge risk:  Dependent with Mobility Barriers to Discharge:  Continued Medical Work up   [employer], LCSW 04/16/2018, 4:12 PM

## 2018-04-16 NOTE — NC FL2 (Signed)
[place] LEVEL OF CARE SCREENING TOOL     IDENTIFICATION  Patient Name: Howard Jackson Birthdate: 07/19/1945 Sex: male Admission Date (Current Location): 04/14/2018  [facility] and [place] Number:  Herbalist and Address:  [facility], [place] [address] [place], [place] [address]      Provider Number: [id number]  Attending Physician Name and Address:  Alma Friendly, MD  Relative Name and Phone Number:  Darlen Round (daughter) [phone]    Current Level of Care: Hospital Recommended Level of Care: [place] Prior Approval Number:    Date Approved/Denied: 04/16/18 PASRR Number: [id number]  Discharge Plan: SNF    Current Diagnoses: Patient Active Problem List   Diagnosis Date Noted  . Fall 04/14/2018  . Fracture of femoral neck, left, closed ([place]) 04/14/2018  . Closed displaced fracture of left femoral neck ([place]) 04/14/2018  . Bunion   . Hypertension due to endocrine disorder 03/07/2017  . BPH (benign prostatic hyperplasia) 11/25/2016  . Chronic bilateral thoracic back pain 08/20/2016  . Mild intermittent asthma 06/18/2016  . Wheezing 06/18/2016  . Cough 06/18/2016  . Left flank pain 04/18/2015  . Benign pheochromocytoma of right adrenal gland 06/23/2014  . PAD (peripheral artery disease) ([place]) 03/31/2013  . HTN (hypertension) 03/31/2013  . History of stroke 03/31/2013  . CAD (coronary artery disease) 03/31/2013  . Renal artery stenosis ([place]) 03/31/2013  . Erectile dysfunction 03/31/2013  . Dyslipidemia 03/31/2013  . Carotid stenosis 03/31/2013  . Left inguinal hernia 09/22/2012    Orientation RESPIRATION BLADDER Height & Weight     Self, Time, Situation, Place  Normal Continent(urinary cath with foley care) Weight: 157 lb 3 oz (71.3 kg) Height:  5\' 5"  (165.1 cm)  BEHAVIORAL SYMPTOMS/MOOD NEUROLOGICAL BOWEL NUTRITION STATUS      Continent Diet(see discharge summary)  AMBULATORY STATUS  COMMUNICATION OF NEEDS Skin   Limited Assist Verbally Surgical wounds(left hip closed incision)                       Personal Care Assistance Level of Assistance  Bathing, Feeding, Dressing, Total care Bathing Assistance: Limited assistance Feeding assistance: Independent Dressing Assistance: Limited assistance Total Care Assistance: Limited assistance   Functional Limitations Info  Sight, Hearing, Speech Sight Info: Adequate Hearing Info: Adequate Speech Info: Adequate    SPECIAL CARE FACTORS FREQUENCY  PT (By licensed PT), OT (By licensed OT)     PT Frequency: 6x weekly OT Frequency: 6x weekly            Contractures Contractures Info: Not present    Additional Factors Info  Allergies   Allergies Info: Allergies:  Elavil Amitriptyline Hcl, Sulfa Antibiotics           Current Medications (04/16/2018):  This is the current hospital active medication list Current Facility-Administered Medications  Medication Dose Route Frequency Provider Last Rate Last Dose  . 0.9 %  sodium chloride infusion   Intravenous Continuous Alma Friendly, MD 75 mL/hr at 04/16/18 1302    . acetaminophen (TYLENOL) tablet 650 mg  650 mg Oral Q6H PRN Swinteck, Aaron Edelman, MD      . acetaminophen-codeine (TYLENOL #3) 300-30 MG per tablet 1-2 tablet  1-2 tablet Oral Q4H PRN Alma Friendly, MD      . alum & mag hydroxide-simeth (MAALOX/MYLANTA) 200-200-20 MG/5ML suspension 30 mL  30 mL Oral Q6H PRN Bodenheimer, Charles A, NP   30 mL at 04/15/18 2134  . aspirin  chewable tablet 81 mg  81 mg Oral BID WC Rod Can, MD   81 mg at 04/16/18 0953  . budesonide (PULMICORT) nebulizer solution 0.25 mg  0.25 mg Nebulization BID Rod Can, MD   0.25 mg at 04/16/18 0927  . clopidogrel (PLAVIX) tablet 75 mg  75 mg Oral Daily Alma Friendly, MD      . docusate sodium (COLACE) capsule 100 mg  100 mg Oral BID Rod Can, MD   100 mg at 04/16/18 0953  . finasteride (PROSCAR) tablet  5 mg  5 mg Oral Daily Rod Can, MD   5 mg at 04/16/18 0952  . hydrALAZINE (APRESOLINE) injection 5 mg  5 mg Intravenous Q2H PRN Swinteck, Aaron Edelman, MD      . HYDROcodone-acetaminophen (NORCO) 7.5-325 MG per tablet 1-2 tablet  1-2 tablet Oral Q4H PRN Swinteck, Aaron Edelman, MD      . HYDROcodone-acetaminophen (NORCO/VICODIN) 5-325 MG per tablet 1-2 tablet  1-2 tablet Oral Q4H PRN Swinteck, Aaron Edelman, MD      . labetalol (NORMODYNE) tablet 300 mg  300 mg Oral BID Rod Can, MD   300 mg at 04/16/18 0953  . lactated ringers infusion   Intravenous Continuous Rod Can, MD 10 mL/hr at 04/15/18 2240    . menthol-cetylpyridinium (CEPACOL) lozenge 3 mg  1 lozenge Oral PRN Swinteck, Aaron Edelman, MD       Or  . phenol (CHLORASEPTIC) mouth spray 1 spray  1 spray Mouth/Throat PRN Swinteck, Aaron Edelman, MD      . methocarbamol (ROBAXIN) tablet 500 mg  500 mg Oral Q6H PRN Alma Friendly, MD   500 mg at 04/16/18 1545  . metoCLOPramide (REGLAN) tablet 5-10 mg  5-10 mg Oral Q8H PRN Swinteck, Aaron Edelman, MD       Or  . metoCLOPramide (REGLAN) injection 5-10 mg  5-10 mg Intravenous Q8H PRN Swinteck, Aaron Edelman, MD      . morphine 2 MG/ML injection 0.5-1 mg  0.5-1 mg Intravenous Q2H PRN Swinteck, Aaron Edelman, MD      . NIFEdipine (PROCARDIA XL/ADALAT-CC) 24 hr tablet 60 mg  60 mg Oral Daily Rod Can, MD   60 mg at 04/16/18 0953   And  . NIFEdipine (PROCARDIA-XL/ADALAT-CC/NIFEDICAL-XL) 24 hr tablet 30 mg  30 mg Oral QHS Rod Can, MD   30 mg at 04/15/18 2126  . ondansetron (ZOFRAN) injection 4 mg  4 mg Intravenous Once PRN Swinteck, Aaron Edelman, MD      . polyethylene glycol (MIRALAX / GLYCOLAX) packet 17 g  17 g Oral Daily PRN Swinteck, Aaron Edelman, MD      . pyridOXINE (VITAMIN B-6) tablet 100 mg  100 mg Oral Daily Rod Can, MD   100 mg at 04/16/18 0952  . senna-docusate (Senokot-S) tablet 2 tablet  2 tablet Oral QPM Rod Can, MD   2 tablet at 04/15/18 1800  . simvastatin (ZOCOR) tablet 40 mg  40 mg Oral QPM  Swinteck, Aaron Edelman, MD   40 mg at 04/15/18 1800  . spironolactone (ALDACTONE) tablet 100 mg  100 mg Oral Daily Alma Friendly, MD   100 mg at 04/16/18 9518  . tamsulosin (FLOMAX) capsule 0.4 mg  0.4 mg Oral Daily Rod Can, MD   0.4 mg at 04/16/18 0952  . tiZANidine (ZANAFLEX) tablet 4 mg  4 mg Oral Q8H PRN Rod Can, MD   4 mg at 04/16/18 0953  . zolpidem (AMBIEN) tablet 5 mg  5 mg Oral QHS PRN Rod Can, MD  Discharge Medications: Please see discharge summary for a list of discharge medications.  Relevant Imaging Results:  Relevant Lab Results:   Additional Information SSN: [id number]  Alberteen Sam, LCSW

## 2018-04-16 NOTE — Care Management Note (Signed)
Case Management Note  Patient Details  Name: CRESPIN FORSTROM MRN: [id number] Date of Birth: 09/23/1945  Subjective/Objective:                    Action/Plan:  CSW working with patient and family for SNF placement Expected Discharge Date:                  Expected Discharge Plan:  Skilled Nursing Facility  In-House Referral:  Clinical Social Work  Discharge planning Services  CM Consult  Post Acute Care Choice:    Choice offered to:     DME Arranged:    DME Agency:     HH Arranged:    [place] Agency:     Status of Service:  Completed, signed off  If discussed at [employer], dates discussed:    Additional Comments:  Carles Collet, RN 04/16/2018, 11:12 AM

## 2018-04-17 LAB — BASIC METABOLIC PANEL
Anion gap: 9 (ref 5–15)
BUN: 29 mg/dL — AB (ref 8–23)
CHLORIDE: 100 mmol/L (ref 98–111)
CO2: 24 mmol/L (ref 22–32)
Calcium: 8.5 mg/dL — ABNORMAL LOW (ref 8.9–10.3)
Creatinine, Ser: 1.14 mg/dL (ref 0.61–1.24)
GFR calc Af Amer: >60 mL/min (ref >60)
GFR calc non Af Amer: >60 mL/min (ref >60)
GLUCOSE: 106 mg/dL — AB (ref 70–99)
POTASSIUM: 4.4 mmol/L (ref 3.5–5.1)
Sodium: 133 mmol/L — ABNORMAL LOW (ref 135–145)

## 2018-04-17 LAB — CBC WITH DIFFERENTIAL/PLATELET
ABS IMMATURE GRANULOCYTES: 0 10*3/uL (ref 0.0–0.1)
Basophils Absolute: 0 10*3/uL (ref 0.0–0.1)
Basophils Relative: 0 %
EOS PCT: 3 %
Eosinophils Absolute: 0.2 10*3/uL (ref 0.0–0.7)
HCT: 31.3 % — ABNORMAL LOW (ref 39.0–52.0)
HEMOGLOBIN: 10.2 g/dL — AB (ref 13.0–17.0)
Immature Granulocytes: 1 %
LYMPHS PCT: 18 %
Lymphs Abs: 1.5 10*3/uL (ref 0.7–4.0)
MCH: 32.8 pg (ref 26.0–34.0)
MCHC: 32.6 g/dL (ref 30.0–36.0)
MCV: 100.6 fL — AB (ref 78.0–100.0)
MONO ABS: 1 10*3/uL (ref 0.1–1.0)
MONOS PCT: 12 %
NEUTROS ABS: 5.5 10*3/uL (ref 1.7–7.7)
Neutrophils Relative %: 66 %
Platelets: 143 10*3/uL — ABNORMAL LOW (ref 150–400)
RBC: 3.11 MIL/uL — ABNORMAL LOW (ref 4.22–5.81)
RDW: 12.6 % (ref 11.5–15.5)
WBC: 8.3 10*3/uL (ref 4.0–10.5)

## 2018-04-17 MED ORDER — MAGNESIUM CITRATE PO SOLN
1.0000 | Freq: Once | ORAL | Status: AC
  Administered 2018-04-18: 1 via ORAL
  Filled 2018-04-17: qty 296

## 2018-04-17 NOTE — Progress Notes (Signed)
PROGRESS NOTE  Howard Jackson XTK:[id number] DOB: [birth year] DOA: 04/14/2018 PCP: [facility] [place]  HPI/Recap of past 24 hours: HPI from Dr Blaine Hamper Howard Jackson is a 73 y.o. male with medical history significant of benign adrenal pheochromocytoma, primary aldosteronism, hypertension, hyperlipidemia, CVA with residual left-sided hemiparesis, anxiety, CAD, PVD, BPH, HTN, HLD who presents with left hip pain s/p mechanical fall. Developed a small skin tear on his L elbow as well. X-ray showed comminuted left femoral fracture.  X-ray of left elbow is limited study, but did not show fracture. CT head is negative for acute intracranial abnormalities.  CT of C-spine no bony fracture. Ortho consulted. Pt admitted for further management.   Today, pt reported feeling better, pain controlled with tylenol and robaxin. All questions answered.  Assessment/Plan: Principal Problem:   Fracture of femoral neck, left, closed ([place]) Active Problems:   HTN (hypertension)   History of stroke   CAD (coronary artery disease)   Dyslipidemia   Benign pheochromocytoma of right adrenal gland   BPH (benign prostatic hyperplasia)   Fall   Closed displaced fracture of left femoral neck (HCC)  Fracture of left femoral neck s/p L hip hemiarthroplasty on 04/15/18 Due to mechanical fall X-ray hip showed commuted fracture through the left femoral neck Orthopedics on board Pain management, DVT ppx per Ortho PT/OT  AKI Resolved S/p gentle hydration Avoid nephrotoxic Daily BMP  History of benign right adrenal pheochromocytoma/HTN BP controlled, currently soft Cardiology consulted to assist in perioperative management, appreciate recs Continue nifedipine, spironolactone, held hydrochlorothiazide, losartan and labetolol due to soft BP Monitor closely  History of CVA with residual left-sided deficits Continue statins, plavix  CAD status post multiple DES Chest pain-free Continue statins,  Plavix  Hyperlipidemia Continue statins  BPH Continue Flomax, Proscar     Code Status: Full  Family Communication: Daughter at bedside  Disposition Plan: SNF   Consultants:  Orthopedics  Cardiology  Procedures: L hip hemiarthroplasty on 04/15/18  Antimicrobials:  None  DVT prophylaxis: ASA BID + Plavix by Orthopedics   Objective: Vitals:   04/17/18 0316 04/17/18 0600 04/17/18 0937 04/17/18 1446  BP: (!) 100/55 91/61  (!) 111/58  Pulse: 72 63  73  Resp: 17   18  Temp: 97.9 F (36.6 C)   98.1 F (36.7 C)  TempSrc: Oral   Oral  SpO2: 98% 98% 98% 99%  Weight:      Height:        Intake/Output Summary (Last 24 hours) at 04/17/2018 1625 Last data filed at 04/17/2018 1230 Gross per 24 hour  Intake 1928.45 ml  Output 675 ml  Net 1253.45 ml   Filed Weights   04/15/18 0234  Weight: 71.3 kg    Exam:   General: NAD  Cardiovascular: S1, S2 present  Respiratory: CTA B  Abdomen: Soft, nontender, nondistended, bowel sounds present  Musculoskeletal: TTP around the left hip, no edema noted   Skin: Normal  Psychiatry: Normal mood   Data Reviewed: CBC: Recent Labs  Lab 04/14/18 2147 04/16/18 0642 04/17/18 0518  WBC 9.9 12.2* 8.3  NEUTROABS 8.3* 9.5* 5.5  HGB 14.4 10.7* 10.2*  HCT 43.4 32.6* 31.3*  MCV 101.2* 101.6* 100.6*  PLT 163 143* 992*   Basic Metabolic Panel: Recent Labs  Lab 04/14/18 2147 04/15/18 0946 04/16/18 0642 04/17/18 0518  NA 133* 133* 133* 133*  K 5.2* 4.2 4.7 4.4  CL 102 103 99 100  CO2 20* 20* 22 24  GLUCOSE 133*  99 111* 106*  BUN 28* 23 38* 29*  CREATININE 1.28* 1.15 1.64* 1.14  CALCIUM 9.5 8.6* 8.2* 8.5*   GFR: Estimated Creatinine Clearance: 49.5 mL/min (by C-G formula based on SCr of 1.14 mg/dL). Liver Function Tests: No results for input(s): AST, ALT, ALKPHOS, BILITOT, PROT, ALBUMIN in the last 168 hours. No results for input(s): LIPASE, AMYLASE in the last 168 hours. No results for input(s): AMMONIA in  the last 168 hours. Coagulation Profile: Recent Labs  Lab 04/14/18 2147  INR 1.11   Cardiac Enzymes: No results for input(s): CKTOTAL, CKMB, CKMBINDEX, TROPONINI in the last 168 hours. BNP (last 3 results) No results for input(s): PROBNP in the last 8760 hours. HbA1C: No results for input(s): HGBA1C in the last 72 hours. CBG: No results for input(s): GLUCAP in the last 168 hours. Lipid Profile: No results for input(s): CHOL, HDL, LDLCALC, TRIG, CHOLHDL, LDLDIRECT in the last 72 hours. Thyroid Function Tests: No results for input(s): TSH, T4TOTAL, FREET4, T3FREE, THYROIDAB in the last 72 hours. Anemia Panel: No results for input(s): VITAMINB12, FOLATE, FERRITIN, TIBC, IRON, RETICCTPCT in the last 72 hours. Urine analysis:    Component Value Date/Time   COLORURINE STRAW (A) 07/10/2016 1937   APPEARANCEUR CLEAR 07/10/2016 1937   LABSPEC 1.020 11/25/2016 1448   PHURINE 7.0 11/25/2016 1448   GLUCOSEU NEGATIVE 11/25/2016 1448   HGBUR NEGATIVE 11/25/2016 1448   BILIRUBINUR NEGATIVE 11/25/2016 1448   KETONESUR NEGATIVE 11/25/2016 1448   PROTEINUR NEGATIVE 11/25/2016 1448   UROBILINOGEN 0.2 11/25/2016 1448   NITRITE NEGATIVE 11/25/2016 1448   LEUKOCYTESUR NEGATIVE 11/25/2016 1448   Sepsis Labs: @LABRCNTIP (procalcitonin:4,lacticidven:4)  ) Recent Results (from the past 240 hour(s))  Surgical pcr screen     Status: None   Collection Time: 04/15/18  3:15 AM  Result Value Ref Range Status   MRSA, PCR NEGATIVE NEGATIVE Final   Staphylococcus aureus NEGATIVE NEGATIVE Final    Comment: (NOTE) The Xpert SA Assay (FDA approved for NASAL specimens in patients 74 years of age and older), is one component of a comprehensive surveillance program. It is not intended to diagnose infection nor to guide or monitor treatment. Performed at [facility], [place] [address] [place], [place] [address]       Studies: No results found.  Scheduled Meds: . aspirin  81 mg Oral BID WC  .  budesonide (PULMICORT) nebulizer solution  0.25 mg Nebulization BID  . clopidogrel  75 mg Oral Daily  . docusate sodium  100 mg Oral BID  . finasteride  5 mg Oral Daily  . lactobacillus acidophilus  2 tablet Oral BID  . NIFEdipine  60 mg Oral Daily   And  . NIFEdipine  30 mg Oral QHS  . pyridOXINE  100 mg Oral Daily  . senna-docusate  2 tablet Oral QPM  . simvastatin  40 mg Oral QPM  . spironolactone  100 mg Oral Daily  . tamsulosin  0.4 mg Oral Daily    Continuous Infusions: . lactated ringers 10 mL/hr at 04/15/18 2240     LOS: 3 days     Alma Friendly, MD [facility]   If 7PM-7AM, please contact night-coverage [email or web address] 04/17/2018, 4:25 PM

## 2018-04-17 NOTE — Progress Notes (Signed)
Progress Note  Patient Name: Howard Jackson Date of Encounter: 04/17/2018  Primary Cardiologist: Sanda Klein, MD   Subjective   Postop day #2 left hip fracture status post ORIF.  Cardiac stable.  Inpatient Medications    Scheduled Meds: . aspirin  81 mg Oral BID WC  . budesonide (PULMICORT) nebulizer solution  0.25 mg Nebulization BID  . clopidogrel  75 mg Oral Daily  . docusate sodium  100 mg Oral BID  . finasteride  5 mg Oral Daily  . lactobacillus acidophilus  2 tablet Oral BID  . NIFEdipine  60 mg Oral Daily   And  . NIFEdipine  30 mg Oral QHS  . pyridOXINE  100 mg Oral Daily  . senna-docusate  2 tablet Oral QPM  . simvastatin  40 mg Oral QPM  . spironolactone  100 mg Oral Daily  . tamsulosin  0.4 mg Oral Daily   Continuous Infusions: . lactated ringers 10 mL/hr at 04/15/18 2240   PRN Meds: acetaminophen, acetaminophen-codeine, alum & mag hydroxide-simeth, hydrALAZINE, HYDROcodone-acetaminophen, HYDROcodone-acetaminophen, menthol-cetylpyridinium **OR** phenol, methocarbamol, metoCLOPramide **OR** metoCLOPramide (REGLAN) injection, morphine injection, ondansetron (ZOFRAN) IV, polyethylene glycol, tiZANidine, zolpidem   Vital Signs    Vitals:   04/16/18 2124 04/17/18 0316 04/17/18 0600 04/17/18 0937  BP: (!) 133/57 (!) 100/55 91/61   Pulse: 79 72 63   Resp:  17    Temp: 98.6 F (37 C) 97.9 F (36.6 C)    TempSrc: Oral Oral    SpO2: 100% 98% 98% 98%  Weight:      Height:        Intake/Output Summary (Last 24 hours) at 04/17/2018 1321 Last data filed at 04/17/2018 1107 Gross per 24 hour  Intake 1688.45 ml  Output 675 ml  Net 1013.45 ml   Filed Weights   04/15/18 0234  Weight: 71.3 kg    Telemetry    Sinus rhythm- Personally Reviewed  ECG    Not performed today- Personally Reviewed  Physical Exam   GEN: No acute distress.   Neck: No JVD Cardiac: RRR, no murmurs, rubs, or gallops.  Respiratory: Clear to auscultation bilaterally. GI:  Soft, nontender, non-distended  MS: No edema; No deformity. Neuro:  Nonfocal  Psych: Normal affect   Labs    Chemistry Recent Labs  Lab 04/15/18 0946 04/16/18 0642 04/17/18 0518  NA 133* 133* 133*  K 4.2 4.7 4.4  CL 103 99 100  CO2 20* 22 24  GLUCOSE 99 111* 106*  BUN 23 38* 29*  CREATININE 1.15 1.64* 1.14  CALCIUM 8.6* 8.2* 8.5*  GFRNONAA >60 40* >60  GFRAA >60 46* >60  ANIONGAP 10 12 9      Hematology Recent Labs  Lab 04/14/18 2147 04/16/18 0642 04/17/18 0518  WBC 9.9 12.2* 8.3  RBC 4.29 3.21* 3.11*  HGB 14.4 10.7* 10.2*  HCT 43.4 32.6* 31.3*  MCV 101.2* 101.6* 100.6*  MCH 33.6 33.3 32.8  MCHC 33.2 32.8 32.6  RDW 12.5 12.4 12.6  PLT 163 143* 143*    Cardiac EnzymesNo results for input(s): TROPONINI in the last 168 hours. No results for input(s): TROPIPOC in the last 168 hours.   BNPNo results for input(s): BNP, PROBNP in the last 168 hours.   DDimer No results for input(s): DDIMER in the last 168 hours.   Radiology    Pelvis Portable  Result Date: 04/15/2018 CLINICAL DATA:  Status post left hip arthroplasty. EXAM: PORTABLE PELVIS 1-2 VIEWS COMPARISON:  Fluoroscopic images of same day. FINDINGS: Status  post placement of left hip arthroplasty. The prosthesis appears to be well situated. No fracture or dislocation is noted. Expected postoperative changes are seen in surrounding soft tissues. IMPRESSION: Status post left hip arthroplasty. Electronically Signed   By: Marijo Conception, M.D.   On: 04/15/2018 13:25    Cardiac Studies   None  Patient Profile     Howard Jackson is a 72 y.o. male with a PMH of CAD s/p multiple PCI/DES with 5 stents in the RCA, LCx, and LAD (most recently 10/2009), HTN with benign pheochromocytoma, HLD, PAD, CVA, who is being seen today for the evaluation of preoperative risk assessment at the request of Dr. Horris Latino.  Assessment & Plan    1: Coronary artery disease- history of CAD status post multiple PCI and stent procedures to  the LAD, circumflex and RCA last done in 2011.  His last Myoview performed in 2014 was normal.  He was on aspirin Plavix prior to surgery which was held.  He is otherwise asymptomatic.  Can restart Plavix when okay with surgery.  2: Essential hypertension- blood pressure controlled on current medications  3: Hyperlipidemia-on statin therapy   [facility] will sign off.   Medication Recommendations: None Other recommendations (labs, testing, etc): None Follow up as an outpatient: Follow-up with Dr. Sallyanne Kuster as an outpatient.  For questions or updates, please contact [place] Please consult [email or web address] for contact info under        Signed, Quay Burow, MD  04/17/2018, 1:21 PM

## 2018-04-17 NOTE — Progress Notes (Signed)
Subjective: 2 Days Post-Op Procedure(s) (LRB): ANTERIOR APPROACH HEMI HIP ARTHROPLASTY (Left) Patient reports pain as 2 on 0-10 scale.   Howard Jackson is doing well. He still notes pain in the hip but it is controlled with pain medicine.  He has been able to tolerate food and liquid without difficulty.  He has not had a bowel movement yet.  He denies chest pain, shortness of breath, nausea, vomiting, diarrhea, fever, or chills.   Objective: Vital signs in last 24 hours: Temp:  [97.9 F (36.6 C)-98.6 F (37 C)] 97.9 F (36.6 C) (10/05 0316) Pulse Rate:  [63-79] 63 (10/05 0600) Resp:  [16-17] 17 (10/05 0316) BP: (91-133)/(55-108) 91/61 (10/05 0600) SpO2:  [97 %-100 %] 98 % (10/05 0600)  Intake/Output from previous day: 10/04 0701 - 10/05 0700 In: 1928.5 [P.O.:720; I.V.:1208.5] Out: 1325 [Urine:1325] Intake/Output this shift: No intake/output data recorded.  Recent Labs    04/14/18 2147 04/16/18 0642 04/17/18 0518  HGB 14.4 10.7* 10.2*   Recent Labs    04/16/18 0642 04/17/18 0518  WBC 12.2* 8.3  RBC 3.21* 3.11*  HCT 32.6* 31.3*  PLT 143* 143*   Recent Labs    04/16/18 0642 04/17/18 0518  NA 133* 133*  K 4.7 4.4  CL 99 100  CO2 22 24  BUN 38* 29*  CREATININE 1.64* 1.14  GLUCOSE 111* 106*  CALCIUM 8.2* 8.5*   Recent Labs    04/14/18 2147  INR 1.11    Alert and oriented x 4 Sensation intact distally Intact pulses distally Dorsiflexion/Plantar flexion intact Incision: dressing C/D/I     Assessment/Plan: 2 Days Post-Op Procedure(s) (LRB): ANTERIOR APPROACH HEMI HIP ARTHROPLASTY (Left) Advance diet  WBAT to the LLE Continue with pain management as needed Plan on discharge to SNF.    Howard Jackson 04/17/2018, 8:42 AM

## 2018-04-18 DIAGNOSIS — E785 Hyperlipidemia, unspecified: Secondary | ICD-10-CM

## 2018-04-18 MED ORDER — SENNOSIDES-DOCUSATE SODIUM 8.6-50 MG PO TABS
3.0000 | ORAL_TABLET | Freq: Every evening | ORAL | Status: DC
  Administered 2018-04-19: 3 via ORAL
  Filled 2018-04-18 (×3): qty 3

## 2018-04-18 MED ORDER — SENNOSIDES-DOCUSATE SODIUM 8.6-50 MG PO TABS: 3.0000 | ORAL_TABLET | Freq: Every evening | ORAL | Status: DC

## 2018-04-18 NOTE — Progress Notes (Signed)
Pt up in chair throughout the night. Stated he did not want to soil the bed or himself when having to use the bathroom.

## 2018-04-18 NOTE — Plan of Care (Signed)

## 2018-04-18 NOTE — Progress Notes (Signed)
PROGRESS NOTE  Howard Jackson SHF:[id number] DOB: March 24, 1946 DOA: 04/14/2018 PCP: [facility] [place]  HPI/Recap of past 24 hours: HPI from Dr Blaine Hamper Howard Jackson is a 72 y.o. male with medical history significant of benign adrenal pheochromocytoma, primary aldosteronism, hypertension, hyperlipidemia, CVA with residual left-sided hemiparesis, anxiety, CAD, PVD, BPH, HTN, HLD who presents with left hip pain s/p mechanical fall. Developed a small skin tear on his L elbow as well. X-ray showed comminuted left femoral fracture.  X-ray of left elbow is limited study, but did not show fracture. CT head is negative for acute intracranial abnormalities.  CT of C-spine no bony fracture. Ortho consulted. Pt admitted for further management.   Today, pt reported feeling better. Reported no BM since admission. Denies any new complaints.  Assessment/Plan: Principal Problem:   Fracture of femoral neck, left, closed ([place]) Active Problems:   HTN (hypertension)   History of stroke   CAD (coronary artery disease)   Dyslipidemia   Benign pheochromocytoma of right adrenal gland   BPH (benign prostatic hyperplasia)   Fall   Closed displaced fracture of left femoral neck ([facility])  Fracture of left femoral neck s/p L hip hemiarthroplasty on 04/15/18 Due to mechanical fall X-ray hip showed commuted fracture through the left femoral neck Orthopedics on board Pain management, DVT ppx per Ortho PT/OT  AKI Resolved S/p gentle hydration Avoid nephrotoxic Daily BMP  History of benign right adrenal pheochromocytoma/HTN BP controlled, currently soft Cardiology consulted to assist in perioperative management, appreciate recs Continue nifedipine, spironolactone, held hydrochlorothiazide, losartan and labetolol due to soft BP Monitor closely  History of CVA with residual left-sided deficits Continue statins, plavix  CAD status post multiple DES Chest pain-free Continue statins,  Plavix  Hyperlipidemia Continue statins  BPH Continue Flomax, Proscar     Code Status: Full  Family Communication: Ex-wife at bedside  Disposition Plan: SNF   Consultants:  Orthopedics  Cardiology  Procedures: L hip hemiarthroplasty on 04/15/18  Antimicrobials:  None  DVT prophylaxis: ASA BID + Plavix by Orthopedics   Objective: Vitals:   04/17/18 1938 04/17/18 2014 04/18/18 0603 04/18/18 0816  BP: (!) 118/55  111/65   Pulse: 77  76   Resp:      Temp: 98.4 F (36.9 C)  98.2 F (36.8 C)   TempSrc: Oral  Oral   SpO2: 100% 98% 98% 98%  Weight:      Height:        Intake/Output Summary (Last 24 hours) at 04/18/2018 1436 Last data filed at 04/18/2018 0900 Gross per 24 hour  Intake 240 ml  Output 1050 ml  Net -810 ml   Filed Weights   04/15/18 0234  Weight: 71.3 kg    Exam:   General: NAD  Cardiovascular: S1, S2 present  Respiratory: CTA B  Abdomen: Soft, nontender, nondistended, bowel sounds present  Musculoskeletal: TTP around the left hip, no edema noted   Skin: Normal  Psychiatry: Normal mood   Data Reviewed: CBC: Recent Labs  Lab 04/14/18 2147 04/16/18 0642 04/17/18 0518  WBC 9.9 12.2* 8.3  NEUTROABS 8.3* 9.5* 5.5  HGB 14.4 10.7* 10.2*  HCT 43.4 32.6* 31.3*  MCV 101.2* 101.6* 100.6*  PLT 163 143* 502*   Basic Metabolic Panel: Recent Labs  Lab 04/14/18 2147 04/15/18 0946 04/16/18 0642 04/17/18 0518  NA 133* 133* 133* 133*  K 5.2* 4.2 4.7 4.4  CL 102 103 99 100  CO2 20* 20* 22 24  GLUCOSE 133* 99  111* 106*  BUN 28* 23 38* 29*  CREATININE 1.28* 1.15 1.64* 1.14  CALCIUM 9.5 8.6* 8.2* 8.5*   GFR: Estimated Creatinine Clearance: 49.5 mL/min (by C-G formula based on SCr of 1.14 mg/dL). Liver Function Tests: No results for input(s): AST, ALT, ALKPHOS, BILITOT, PROT, ALBUMIN in the last 168 hours. No results for input(s): LIPASE, AMYLASE in the last 168 hours. No results for input(s): AMMONIA in the last 168  hours. Coagulation Profile: Recent Labs  Lab 04/14/18 2147  INR 1.11   Cardiac Enzymes: No results for input(s): CKTOTAL, CKMB, CKMBINDEX, TROPONINI in the last 168 hours. BNP (last 3 results) No results for input(s): PROBNP in the last 8760 hours. HbA1C: No results for input(s): HGBA1C in the last 72 hours. CBG: No results for input(s): GLUCAP in the last 168 hours. Lipid Profile: No results for input(s): CHOL, HDL, LDLCALC, TRIG, CHOLHDL, LDLDIRECT in the last 72 hours. Thyroid Function Tests: No results for input(s): TSH, T4TOTAL, FREET4, T3FREE, THYROIDAB in the last 72 hours. Anemia Panel: No results for input(s): VITAMINB12, FOLATE, FERRITIN, TIBC, IRON, RETICCTPCT in the last 72 hours. Urine analysis:    Component Value Date/Time   COLORURINE STRAW (A) 07/10/2016 1937   APPEARANCEUR CLEAR 07/10/2016 1937   LABSPEC 1.020 11/25/2016 1448   PHURINE 7.0 11/25/2016 1448   GLUCOSEU NEGATIVE 11/25/2016 1448   HGBUR NEGATIVE 11/25/2016 1448   BILIRUBINUR NEGATIVE 11/25/2016 1448   KETONESUR NEGATIVE 11/25/2016 1448   PROTEINUR NEGATIVE 11/25/2016 1448   UROBILINOGEN 0.2 11/25/2016 1448   NITRITE NEGATIVE 11/25/2016 1448   LEUKOCYTESUR NEGATIVE 11/25/2016 1448   Sepsis Labs: @LABRCNTIP (procalcitonin:4,lacticidven:4)  ) Recent Results (from the past 240 hour(s))  Surgical pcr screen     Status: None   Collection Time: 04/15/18  3:15 AM  Result Value Ref Range Status   MRSA, PCR NEGATIVE NEGATIVE Final   Staphylococcus aureus NEGATIVE NEGATIVE Final    Comment: (NOTE) The Xpert SA Assay (FDA approved for NASAL specimens in patients 20 years of age and older), is one component of a comprehensive surveillance program. It is not intended to diagnose infection nor to guide or monitor treatment. Performed at [facility], [place] [address], [place], [place] [address]       Studies: No results found.  Scheduled Meds: . aspirin  81 mg Oral BID WC  . budesonide  (PULMICORT) nebulizer solution  0.25 mg Nebulization BID  . clopidogrel  75 mg Oral Daily  . docusate sodium  100 mg Oral BID  . finasteride  5 mg Oral Daily  . lactobacillus acidophilus  2 tablet Oral BID  . NIFEdipine  60 mg Oral Daily   And  . NIFEdipine  30 mg Oral QHS  . pyridOXINE  100 mg Oral Daily  . senna-docusate  3 tablet Oral QPM  . simvastatin  40 mg Oral QPM  . spironolactone  100 mg Oral Daily  . tamsulosin  0.4 mg Oral Daily    Continuous Infusions: . lactated ringers 10 mL/hr at 04/15/18 2240     LOS: 4 days     Alma Friendly, MD [facility]   If 7PM-7AM, please contact night-coverage [email or web address] 04/18/2018, 2:36 PM

## 2018-04-19 MED ORDER — BISACODYL 10 MG RE SUPP
10.0000 mg | Freq: Once | RECTAL | Status: AC
  Administered 2018-04-19: 10 mg via RECTAL
  Filled 2018-04-19 (×2): qty 1

## 2018-04-19 MED ORDER — LABETALOL HCL 300 MG PO TABS
300.0000 mg | ORAL_TABLET | Freq: Two times a day (BID) | ORAL | Status: DC
  Administered 2018-04-19 – 2018-04-20 (×3): 300 mg via ORAL
  Filled 2018-04-19 (×3): qty 1

## 2018-04-19 NOTE — Progress Notes (Signed)
Physical Therapy Treatment Patient Details Name: Howard Jackson MRN: [id number] DOB: 1946-05-15 Today's Date: 04/19/2018    History of Present Illness 72 y.o. male with medical history significant of benign adrenal pheochromocytoma, primary aldosteronism, hypertension, hyperlipidemia, CVA with residual left-sided hemiparesis, anxiety, CAD, PVD, BPH, HTN, HLD who presents with left hip pain s/p mechanical fall. Found to have comminuted left femoral fracture, now s/p Left hip hemiarthroplasty, anterior approach. WBAT post op    PT Comments    Patient seen for mobility progression. Pt is making progress toward PT goals and requires mod A for functional transfers and min/mod A (+2 for safety) with gait training using hem walker due to spastic L UE. Pt with leg length discrepancy at baseline so try to ambulate with L shoe donned next session. Continue to progress as tolerated with anticipated d/c to SNF for further skilled PT services.     Follow Up Recommendations  SNF     Equipment Recommendations  3in1 (PT)    Recommendations for Other Services       Precautions / Restrictions Precautions Precautions: Fall Precaution Comments: spastic L UE from previous stroke Restrictions Weight Bearing Restrictions: Yes LUE Weight Bearing: Weight bearing as tolerated LLE Weight Bearing: Weight bearing as tolerated    Mobility  Bed Mobility               General bed mobility comments: pt OOB in chair upon arrival  Transfers Overall transfer level: Needs assistance Equipment used: Hemi-walker Transfers: Sit to/from Stand Sit to Stand: Mod assist         General transfer comment: assist to power up into standing and for balance with L LE blocked for safety; use of hemi walker upon standing  Ambulation/Gait Ambulation/Gait assistance: Min assist;Mod assist;+2 safety/equipment(chair follow) Gait Distance (Feet): (73ft total with seated rest break required ) Assistive device:  Hemi-walker Gait Pattern/deviations: Step-to pattern;Decreased step length - right;Decreased step length - left;Decreased stance time - left;Decreased weight shift to left;Decreased dorsiflexion - left Gait velocity: decreased   General Gait Details: pt with L LE external rotation and leg length discrepancy which pt reports is baseline; multimodal cues for safe use of hemi walker and mod cues throughout for positioning and vc for posture    Stairs             Wheelchair Mobility    Modified Rankin (Stroke Patients Only)       Balance Overall balance assessment: Needs assistance Sitting-balance support: Single extremity supported;Feet supported Sitting balance-Leahy Scale: Fair     Standing balance support: Single extremity supported;During functional activity Standing balance-Leahy Scale: Poor                              Cognition Arousal/Alertness: Awake/alert Behavior During Therapy: WFL for tasks assessed/performed Overall Cognitive Status: Within Functional Limits for tasks assessed                                        Exercises      General Comments        Pertinent Vitals/Pain Pain Assessment: Faces Faces Pain Scale: Hurts even more(to 8) Pain Location: L hip Pain Descriptors / Indicators: Grimacing;Guarding;Sore Pain Intervention(s): Limited activity within patient's tolerance;Monitored during session;Repositioned    Home Living  Prior Function            PT Goals (current goals can now be found in the care plan section) Progress towards PT goals: Progressing toward goals    Frequency    Min 3X/week      PT Plan Current plan remains appropriate    Co-evaluation              AM-PAC PT "6 Clicks" Daily Activity  Outcome Measure  Difficulty turning over in bed (including adjusting bedclothes, sheets and blankets)?: Unable Difficulty moving from lying on back to sitting  on the side of the bed? : Unable Difficulty sitting down on and standing up from a chair with arms (e.g., wheelchair, bedside commode, etc,.)?: Unable Help needed moving to and from a bed to chair (including a wheelchair)?: A Lot Help needed walking in hospital room?: A Lot Help needed climbing 3-5 steps with a railing? : A Lot 6 Click Score: 9    End of Session Equipment Utilized During Treatment: Gait belt Activity Tolerance: Patient tolerated treatment well Patient left: in chair;with call bell/phone within reach Nurse Communication: Mobility status PT Visit Diagnosis: Muscle weakness (generalized) (M62.81);Difficulty in walking, not elsewhere classified (R26.2);Pain Pain - Right/Left: Left Pain - part of body: Hip     Time: 1421-1446 PT Time Calculation (min) (ACUTE ONLY): 25 min  Charges:  $Gait Training: 23-37 mins                     Earney Navy, PTA Acute Rehabilitation Services Pager: [phone] Office: [phone]     Darliss Cheney 04/19/2018, 3:43 PM

## 2018-04-19 NOTE — Plan of Care (Signed)

## 2018-04-19 NOTE — Progress Notes (Signed)
CSW following up on insurance authorization through [facility], pending authorization still.   Once authorized can move forward with SNF selections. Dr. Ree Kida CSW of readiness to medically discharge.   [place], [place]

## 2018-04-19 NOTE — Progress Notes (Signed)
PROGRESS NOTE  Howard Jackson:[id number] DOB: 1945-11-04 DOA: 04/14/2018 PCP: [facility] [place]  HPI/Recap of past 24 hours: HPI from Dr Blaine Hamper Howard Jackson is a 72 y.o. male with medical history significant of benign adrenal pheochromocytoma, primary aldosteronism, hypertension, hyperlipidemia, CVA with residual left-sided hemiparesis, anxiety, CAD, PVD, BPH, HTN, HLD who presents with left hip pain s/p mechanical fall. Developed a small skin tear on his L elbow as well. X-ray showed comminuted left femoral fracture.  X-ray of left elbow is limited study, but did not show fracture. CT head is negative for acute intracranial abnormalities.  CT of C-spine no bony fracture. Ortho consulted. Pt admitted for further management.   Today, pt reported some post op pain, not taking any pain meds for fear of constipation. Advised pt that he will be given stool softners as long as he is on pain meds. Pt had a good BM today  Assessment/Plan: Principal Problem:   Fracture of femoral neck, left, closed ([place]) Active Problems:   HTN (hypertension)   History of stroke   CAD (coronary artery disease)   Dyslipidemia   Benign pheochromocytoma of right adrenal gland   BPH (benign prostatic hyperplasia)   Fall   Closed displaced fracture of left femoral neck (HCC)  Fracture of left femoral neck s/p L hip hemiarthroplasty on 04/15/18 Due to mechanical fall X-ray hip showed commuted fracture through the left femoral neck Orthopedics on board Pain management, DVT ppx per Ortho PT/OT  AKI Resolved S/p gentle hydration Avoid nephrotoxic Daily BMP  History of benign right adrenal pheochromocytoma/HTN BP controlled Cardiology consulted to assist in perioperative management, appreciate recs Continue nifedipine, spironolactone, labetalol, held hydrochlorothiazide, losartan for now, will re-start pending BP Monitor closely  History of CVA with residual left-sided  deficits Continue statins, plavix  CAD status post multiple DES Chest pain-free Continue statins, Plavix  Hyperlipidemia Continue statins  BPH Continue Flomax, Proscar     Code Status: Full  Family Communication: None at bedside  Disposition Plan: SNF, awaiting insurance authorization   Consultants:  Orthopedics  Cardiology  Procedures: L hip hemiarthroplasty on 04/15/18  Antimicrobials:  None  DVT prophylaxis: ASA BID + Plavix by Orthopedics   Objective: Vitals:   04/18/18 1330 04/18/18 2041 04/18/18 2100 04/19/18 0420  BP: 110/70  133/61 132/67  Pulse: 80  76 75  Resp:      Temp:   98.6 F (37 C) 98 F (36.7 C)  TempSrc:   Oral Oral  SpO2:  100% 99% 100%  Weight:      Height:        Intake/Output Summary (Last 24 hours) at 04/19/2018 1547 Last data filed at 04/18/2018 2200 Gross per 24 hour  Intake -  Output 225 ml  Net -225 ml   Filed Weights   04/15/18 0234  Weight: 71.3 kg    Exam:   General: NAD  Cardiovascular: S1, S2 present  Respiratory: CTA B  Abdomen: Soft, nontender, nondistended, bowel sounds present  Musculoskeletal: TTP around the left hip, no edema noted   Skin: Normal  Psychiatry: Normal mood   Data Reviewed: CBC: Recent Labs  Lab 04/14/18 2147 04/16/18 0642 04/17/18 0518  WBC 9.9 12.2* 8.3  NEUTROABS 8.3* 9.5* 5.5  HGB 14.4 10.7* 10.2*  HCT 43.4 32.6* 31.3*  MCV 101.2* 101.6* 100.6*  PLT 163 143* 938*   Basic Metabolic Panel: Recent Labs  Lab 04/14/18 2147 04/15/18 0946 04/16/18 0642 04/17/18 0518  NA 133*  133* 133* 133*  K 5.2* 4.2 4.7 4.4  CL 102 103 99 100  CO2 20* 20* 22 24  GLUCOSE 133* 99 111* 106*  BUN 28* 23 38* 29*  CREATININE 1.28* 1.15 1.64* 1.14  CALCIUM 9.5 8.6* 8.2* 8.5*   GFR: Estimated Creatinine Clearance: 49.5 mL/min (by C-G formula based on SCr of 1.14 mg/dL). Liver Function Tests: No results for input(s): AST, ALT, ALKPHOS, BILITOT, PROT, ALBUMIN in the last 168  hours. No results for input(s): LIPASE, AMYLASE in the last 168 hours. No results for input(s): AMMONIA in the last 168 hours. Coagulation Profile: Recent Labs  Lab 04/14/18 2147  INR 1.11   Cardiac Enzymes: No results for input(s): CKTOTAL, CKMB, CKMBINDEX, TROPONINI in the last 168 hours. BNP (last 3 results) No results for input(s): PROBNP in the last 8760 hours. HbA1C: No results for input(s): HGBA1C in the last 72 hours. CBG: No results for input(s): GLUCAP in the last 168 hours. Lipid Profile: No results for input(s): CHOL, HDL, LDLCALC, TRIG, CHOLHDL, LDLDIRECT in the last 72 hours. Thyroid Function Tests: No results for input(s): TSH, T4TOTAL, FREET4, T3FREE, THYROIDAB in the last 72 hours. Anemia Panel: No results for input(s): VITAMINB12, FOLATE, FERRITIN, TIBC, IRON, RETICCTPCT in the last 72 hours. Urine analysis:    Component Value Date/Time   COLORURINE STRAW (A) 07/10/2016 1937   APPEARANCEUR CLEAR 07/10/2016 1937   LABSPEC 1.020 11/25/2016 1448   PHURINE 7.0 11/25/2016 1448   GLUCOSEU NEGATIVE 11/25/2016 1448   HGBUR NEGATIVE 11/25/2016 1448   BILIRUBINUR NEGATIVE 11/25/2016 1448   KETONESUR NEGATIVE 11/25/2016 1448   PROTEINUR NEGATIVE 11/25/2016 1448   UROBILINOGEN 0.2 11/25/2016 1448   NITRITE NEGATIVE 11/25/2016 1448   LEUKOCYTESUR NEGATIVE 11/25/2016 1448   Sepsis Labs: @LABRCNTIP (procalcitonin:4,lacticidven:4)  ) Recent Results (from the past 240 hour(s))  Surgical pcr screen     Status: None   Collection Time: 04/15/18  3:15 AM  Result Value Ref Range Status   MRSA, PCR NEGATIVE NEGATIVE Final   Staphylococcus aureus NEGATIVE NEGATIVE Final    Comment: (NOTE) The Xpert SA Assay (FDA approved for NASAL specimens in patients 29 years of age and older), is one component of a comprehensive surveillance program. It is not intended to diagnose infection nor to guide or monitor treatment. Performed at [facility], [place] [address],  [place], [place] [address]       Studies: No results found.  Scheduled Meds: . aspirin  81 mg Oral BID WC  . budesonide (PULMICORT) nebulizer solution  0.25 mg Nebulization BID  . clopidogrel  75 mg Oral Daily  . finasteride  5 mg Oral Daily  . labetalol  300 mg Oral BID  . lactobacillus acidophilus  2 tablet Oral BID  . NIFEdipine  60 mg Oral Daily   And  . NIFEdipine  30 mg Oral QHS  . pyridOXINE  100 mg Oral Daily  . senna-docusate  3 tablet Oral QPM  . simvastatin  40 mg Oral QPM  . spironolactone  100 mg Oral Daily  . tamsulosin  0.4 mg Oral Daily    Continuous Infusions: . lactated ringers 10 mL/hr at 04/15/18 2240     LOS: 5 days     Alma Friendly, MD [facility]   If 7PM-7AM, please contact night-coverage [email or web address] 04/19/2018, 3:47 PM

## 2018-04-19 NOTE — Care Management Important Message (Signed)
Important Message  Patient Details  Name: Howard Jackson MRN: [id number] Date of Birth: 27-Apr-1946   Medicare Important Message Given:  Yes  IM signed on 04/16/2018   Orbie Pyo 04/19/2018, 8:38 AM

## 2018-04-19 NOTE — Progress Notes (Signed)
Patient has not had a BM since 04/13/18 even after taking prn meds. Patient refuses to take tylenol # 3 even though he complains of pain to the Lt hip/leg at a 9 out of 10. Patient is requesting a suppository for constipation and pain medication that will not cause constipation.

## 2018-04-19 NOTE — Progress Notes (Signed)
Pt told RN he wanted to make his daughter his POA, RN gave pt paperwork to fill out and notified chaplin. Chaplin told RN when the patient is done filling out the paperwork and ready to sign to notify her so she can Notarize the paper.

## 2018-04-20 DIAGNOSIS — I158 Other secondary hypertension: Secondary | ICD-10-CM

## 2018-04-20 MED ORDER — BACID PO TABS: 2.0000 | ORAL_TABLET | Freq: Two times a day (BID) | ORAL | Status: DC

## 2018-04-20 MED ORDER — SENNOSIDES-DOCUSATE SODIUM 8.6-50 MG PO TABS: 3.0000 | ORAL_TABLET | Freq: Every evening | ORAL | Status: DC

## 2018-04-20 MED ORDER — ASPIRIN 81 MG PO CHEW: 81.0000 mg | CHEWABLE_TABLET | Freq: Two times a day (BID) | ORAL | 1 refills | Status: DC

## 2018-04-20 MED ORDER — METHOCARBAMOL 500 MG PO TABS: 500.0000 mg | ORAL_TABLET | Freq: Four times a day (QID) | ORAL | Status: DC | PRN

## 2018-04-20 MED ORDER — SPIRONOLACTONE 100 MG PO TABS: 100.0000 mg | ORAL_TABLET | Freq: Every day | ORAL | Status: DC

## 2018-04-20 MED ORDER — ACETAMINOPHEN-CODEINE #3 300-30 MG PO TABS: 1.0000 | ORAL_TABLET | ORAL | 0 refills | Status: AC | PRN

## 2018-04-20 NOTE — Clinical Social Work Placement (Signed)
   CLINICAL SOCIAL WORK PLACEMENT  NOTE  Date:  04/20/2018  Patient Details  Name: Howard Jackson MRN: [id number] Date of Birth: 19-Sep-1945  Clinical Social Work is seeking post-discharge placement for this patient at the [place] level of care (*CSW will initial, date and re-position this form in  chart as items are completed):  Yes   Patient/family provided with [facility] Work Department's list of facilities offering this level of care within the geographic area requested by the patient (or if unable, by the patient's family).  Yes   Patient/family informed of their freedom to choose among providers that offer the needed level of care, that participate in Medicare, Medicaid or managed care program needed by the patient, have an available bed and are willing to accept the patient.      Patient/family informed of [facility]'s ownership interest in [facility] and [facility], as well as of the fact that they are under no obligation to receive care at these facilities.  PASRR submitted to EDS on       PASRR number received on 04/16/18     Existing PASRR number confirmed on       FL2 transmitted to all facilities in geographic area requested by pt/family on 04/16/18     FL2 transmitted to all facilities within larger geographic area on       Patient informed that his/her managed care company has contracts with or will negotiate with certain facilities, including the following:        Yes   Patient/family informed of bed offers received.  Patient chooses bed at [facility]     Physician recommends and patient chooses bed at      Patient to be transferred to [facility] on 04/20/18.  Patient to be transferred to facility by [facility]     Patient family notified on 04/20/18 of transfer.  Name of family member notified:  Abelardo Diesel (daughter)      PHYSICIAN       Additional Comment:     _______________________________________________ Alberteen Sam, LCSW 04/20/2018, 11:47 AM

## 2018-04-20 NOTE — Progress Notes (Signed)
Patient will DC to: [facility] Anticipated DC date: 04/20/18 Family notified: Amalthea (daughter) and spouse in room Transport by: Corey Harold  Per MD patient ready for DC to [facility]. RN, patient, patient's family, and facility notified of DC. Discharge Summary sent to facility. RN given number for report [phone] Room 120 B. DC packet on chart. Ambulance transport requested for patient.  CSW signing off.  [place], [place]

## 2018-04-20 NOTE — Discharge Summary (Signed)
Discharge Summary  Howard Jackson UXL:[id number] DOB: [birth year]  PCP: [facility] @ [place]  Admit date: 04/14/2018 Discharge date: 04/20/2018  Time spent: 35 mins  Recommendations for Outpatient Follow-up:  1. PCP 2. Orthopedics 3. Cardiology  Discharge Diagnoses:  Active Hospital Problems   Diagnosis Date Noted  . Fracture of femoral neck, left, closed ([place]) 04/14/2018  . Fall 04/14/2018  . Closed displaced fracture of left femoral neck ([place]) 04/14/2018  . BPH (benign prostatic hyperplasia) 11/25/2016  . Benign pheochromocytoma of right adrenal gland 06/23/2014  . CAD (coronary artery disease) 03/31/2013  . Dyslipidemia 03/31/2013  . History of stroke 03/31/2013  . HTN (hypertension) 03/31/2013    Resolved Hospital Problems  No resolved problems to display.    Discharge Condition: Stable  Diet recommendation: Heart healthy  Vitals:   04/19/18 2118 04/20/18 0502  BP: 124/63 128/70  Pulse: 69 68  Resp: 16 14  Temp: 98.6 F (37 C) 98.3 F (36.8 C)  SpO2: 97% 98%    History of present illness:  Howard Jackson a 72 y.o.malewith medical history significant ofbenign adrenal pheochromocytoma,primary aldosteronism, hypertension, hyperlipidemia, CVA with residual left-sided hemiparesis, anxiety, CAD, PVD, BPH, HTN, HLD who presents with left hip pain s/p mechanical fall. Developed a small skin tear on his L elbow as well. X-ray showed comminuted left femoral fracture. X-ray of left elbow is limited study, but did not show fracture. CT head is negative for acute intracranial abnormalities. CT of C-spine no bony fracture. Ortho consulted. Pt admitted for further management.   Today, pt denies any new complaints. Stable to be discharged to SNF for rehab  Hospital Course:  Principal Problem:   Fracture of femoral neck, left, closed ([place]) Active Problems:   HTN (hypertension)   History of stroke   CAD (coronary artery disease)  Dyslipidemia   Benign pheochromocytoma of right adrenal gland   BPH (benign prostatic hyperplasia)   Fall   Closed displaced fracture of left femoral neck ([facility])  Fracture of left femoral neck s/p L hip hemiarthroplasty on 04/15/18 Due to mechanical fall X-ray hip showed commuted fracture through the left femoral neck Orthopedics follow up Pain management: Tylenol #3, robaxan as per pt request DVT ppx with ASA, clopidogrel  AKI Resolved S/p gentle hydration Avoid nephrotoxic Daily BMP  History of benign right adrenal pheochromocytoma/HTN BP controlled Cardiology consulted to assist in perioperative management, appreciate recs Continue nifedipine, spironolactone, labetalol, held hydrochlorothiazide and losartan for now, please re-start pending BP Monitor closely  History of CVA with residual left-sided deficits Continue statins, plavix  CAD status post multiple DES Chest pain-free Continue statins, Plavix  Hyperlipidemia Continue statins  BPH Continue Flomax, Proscar   Procedures:  L hip hemiarthroplasty on 04/15/18  Consultations:  Orthopedics  Cardiology   Discharge Exam: BP 128/70 (BP Location: Right Arm)   Pulse 68   Temp 98.3 F (36.8 C) (Oral)   Resp 14   Ht 5\' 5"  (1.651 m)   Wt 71.3 kg   SpO2 98%   BMI 26.16 kg/m   General: NAD Cardiovascular: S1, S2 present Respiratory: CTAB  Discharge Instructions You were cared for by a hospitalist during your hospital stay. If you have any questions about your discharge medications or the care you received while you were in the hospital after you are discharged, you can call the unit and asked to speak with the hospitalist on call if the hospitalist that took care of you is not available. Once you are discharged,  your primary care physician will handle any further medical issues. Please note that NO REFILLS for any discharge medications will be authorized once you are discharged, as it is imperative that  you return to your primary care physician (or establish a relationship with a primary care physician if you do not have one) for your aftercare needs so that they can reassess your need for medications and monitor your lab values.   Allergies as of 04/20/2018      Reactions   Elavil [amitriptyline Hcl] Hypertension   Sulfa Antibiotics       Medication List    STOP taking these medications   amoxicillin 875 MG tablet Commonly known as:  AMOXIL   losartan-hydrochlorothiazide 100-25 MG tablet Commonly known as:  HYZAAR   triamcinolone cream 0.1 % Commonly known as:  KENALOG   VENTOLIN HFA 108 (90 Base) MCG/ACT inhaler Generic drug:  albuterol     TAKE these medications   acetaminophen-codeine 300-30 MG tablet Commonly known as:  TYLENOL #3 Take 1-2 tablets by mouth every 4 (four) hours as needed for up to 7 days for moderate pain.   aspirin 81 MG chewable tablet Chew 1 tablet (81 mg total) by mouth 2 (two) times daily with a meal.   clopidogrel 75 MG tablet Commonly known as:  PLAVIX Take 1 tablet (75 mg total) by mouth daily.   COLON CARE PO Take 2 capsules by mouth every evening.   finasteride 5 MG tablet Commonly known as:  PROSCAR Take 5 mg by mouth daily.   FLOVENT HFA 110 MCG/ACT inhaler Generic drug:  fluticasone Inhale 1 puff into the lungs 3 (three) times daily.   labetalol 300 MG tablet Commonly known as:  NORMODYNE TAKE 1 TABLET (300 MG TOTAL) BY MOUTH 2 (TWO) TIMES DAILY.   lactobacillus acidophilus Tabs tablet Take 2 tablets by mouth 2 (two) times daily.   meloxicam 7.5 MG tablet Commonly known as:  MOBIC Take 1 tablet (7.5 mg total) by mouth as needed for pain.   methocarbamol 500 MG tablet Commonly known as:  ROBAXIN Take 1 tablet (500 mg total) by mouth every 6 (six) hours as needed for muscle spasms.   MSM 1000 MG Tabs Take 500 mg by mouth daily.   NIFEdipine 60 MG 24 hr tablet Commonly known as:  PROCARDIA-XL/ADALAT CC Take 1 tablet  (60 mg) by mouth every morning. Use other prescription (30mg ) in evening only. What changed:    how much to take  how to take this  when to take this  additional instructions   NIFEdipine 30 MG 24 hr tablet Commonly known as:  PROCARDIA-XL/ADALAT CC Take 1 tablet (30 mg) by mouth every evening. Use other prescription (60mg ) in morning only. What changed:  Another medication with the same name was changed. Make sure you understand how and when to take each.   pyridOXINE 100 MG tablet Commonly known as:  VITAMIN B-6 Take 100 mg by mouth daily.   senna-docusate 8.6-50 MG tablet Commonly known as:  Senokot-S Take 3 tablets by mouth every evening. What changed:  how much to take   simvastatin 40 MG tablet Commonly known as:  ZOCOR Take 1 tablet (40 mg total) by mouth every evening.   spironolactone 100 MG tablet Commonly known as:  ALDACTONE Take 1 tablet (100 mg total) by mouth daily. Start taking on:  04/21/2018 What changed:    medication strength  how much to take   tamsulosin 0.4 MG Caps capsule Commonly known  as:  FLOMAX Take 1 capsule (0.4 mg total) by mouth daily.   tiZANidine 4 MG tablet Commonly known as:  ZANAFLEX TAKE 1-2 TABLETS (4-8 MG TOTAL) BY MOUTH AT BEDTIME AS NEEDED. FOR MUSCLE SPASMS What changed:    reasons to take this  additional instructions      Allergies  Allergen Reactions  . Elavil [Amitriptyline Hcl] Hypertension  . Sulfa Antibiotics     Contact information for follow-up providers    Swinteck, Aaron Edelman, MD. Schedule an appointment as soon as possible for a visit in 2 weeks.   Specialty:  Orthopedic Surgery Why:  For wound re-check Contact information: [address] [place] [address] [phone]        Sanda Klein, MD Follow up on 06/17/2018.   Specialty:  Cardiology Why:  Please arrive 15 minutes early for your 3:00pm cardiology appointment Contact information: [address] [place] [address] [place], Perla @ [place]. Schedule an appointment as soon as possible for a visit in 1 week(s).   Specialty:  Family Medicine Contact information: [place] [address] [phone]            Contact information for after-discharge care    Destination    [facility] Preferred SNF .   Service:  Skilled Nursing Contact information: [address] [place] [phone]                   The results of significant diagnostics from this hospitalization (including imaging, microbiology, ancillary and laboratory) are listed below for reference.    Significant Diagnostic Studies: Dg Elbow 2 Views Left  Result Date: 04/14/2018 CLINICAL DATA:  Pain after fall. EXAM: LEFT ELBOW - 2 VIEW COMPARISON:  None. FINDINGS: The lateral view is limited due to over flexion. Mild degenerative changes. No obvious joint effusion. No fractures identified. IMPRESSION: The study is limited due to positioning but no obvious fracture or effusion noted. Electronically Signed   By: Dorise Bullion III M.D   On: 04/14/2018 19:05   Ct Head Wo Contrast  Result Date: 04/14/2018 CLINICAL DATA:  Fall onto pavement EXAM: CT HEAD WITHOUT CONTRAST CT CERVICAL SPINE WITHOUT CONTRAST TECHNIQUE: Multidetector CT imaging of the head and cervical spine was performed following the standard protocol without intravenous contrast. Multiplanar CT image reconstructions of the cervical spine were also generated. COMPARISON:  CT head dated 10/02/2017 FINDINGS: CT HEAD FINDINGS Brain: No evidence of acute infarction, hemorrhage, hydrocephalus, extra-axial collection or mass lesion/mass effect. Encephalomalacic changes related to old right basal ganglia infarct. Ex vacuo dilatation of the right lateral ventricle. Mild subcortical white matter and periventricular small vessel ischemic changes. Vascular:  Intracranial atherosclerosis. Skull: Normal. Negative for fracture or focal lesion. Sinuses/Orbits: The visualized paranasal sinuses are essentially clear. The mastoid air cells are unopacified. Other: None. CT CERVICAL SPINE FINDINGS Alignment: Mild straightening of the cervical spine. Skull base and vertebrae: No acute fracture. No primary bone lesion or focal pathologic process. Soft tissues and spinal canal: No prevertebral fluid or swelling. No visible canal hematoma. Disc levels: Partial fusion at C5-6. Mild multilevel degenerative changes. Spinal canal is patent. Upper chest: Visualized lung apices are clear. Other: 11 mm right thyroid nodule IMPRESSION: No evidence of acute intracranial abnormality. Old right basal ganglia infarct. Mild small vessel ischemic changes. No evidence of traumatic injury to the cervical spine. Mild degenerative changes. Electronically Signed  By: Julian Hy M.D.   On: 04/14/2018 21:29   Ct Cervical Spine Wo Contrast  Result Date: 04/14/2018 CLINICAL DATA:  Fall onto pavement EXAM: CT HEAD WITHOUT CONTRAST CT CERVICAL SPINE WITHOUT CONTRAST TECHNIQUE: Multidetector CT imaging of the head and cervical spine was performed following the standard protocol without intravenous contrast. Multiplanar CT image reconstructions of the cervical spine were also generated. COMPARISON:  CT head dated 10/02/2017 FINDINGS: CT HEAD FINDINGS Brain: No evidence of acute infarction, hemorrhage, hydrocephalus, extra-axial collection or mass lesion/mass effect. Encephalomalacic changes related to old right basal ganglia infarct. Ex vacuo dilatation of the right lateral ventricle. Mild subcortical white matter and periventricular small vessel ischemic changes. Vascular: Intracranial atherosclerosis. Skull: Normal. Negative for fracture or focal lesion. Sinuses/Orbits: The visualized paranasal sinuses are essentially clear. The mastoid air cells are unopacified. Other: None. CT CERVICAL SPINE  FINDINGS Alignment: Mild straightening of the cervical spine. Skull base and vertebrae: No acute fracture. No primary bone lesion or focal pathologic process. Soft tissues and spinal canal: No prevertebral fluid or swelling. No visible canal hematoma. Disc levels: Partial fusion at C5-6. Mild multilevel degenerative changes. Spinal canal is patent. Upper chest: Visualized lung apices are clear. Other: 11 mm right thyroid nodule IMPRESSION: No evidence of acute intracranial abnormality. Old right basal ganglia infarct. Mild small vessel ischemic changes. No evidence of traumatic injury to the cervical spine. Mild degenerative changes. Electronically Signed   By: Julian Hy M.D.   On: 04/14/2018 21:29   Pelvis Portable  Result Date: 04/15/2018 CLINICAL DATA:  Status post left hip arthroplasty. EXAM: PORTABLE PELVIS 1-2 VIEWS COMPARISON:  Fluoroscopic images of same day. FINDINGS: Status post placement of left hip arthroplasty. The prosthesis appears to be well situated. No fracture or dislocation is noted. Expected postoperative changes are seen in surrounding soft tissues. IMPRESSION: Status post left hip arthroplasty. Electronically Signed   By: Marijo Conception, M.D.   On: 04/15/2018 13:25   Ct Hip Left Wo Contrast  Result Date: 04/14/2018 CLINICAL DATA:  Hip fracture EXAM: CT OF THE LEFT HIP WITHOUT CONTRAST TECHNIQUE: Multidetector CT imaging of the left hip was performed according to the standard protocol. Multiplanar CT image reconstructions were also generated. COMPARISON:  Radiograph 04/14/2018 FINDINGS: Bones/Joint/Cartilage Left femoral head projects in joint. Acute mildly comminuted and impacted fracture involving the left femoral neck. Fracture lucency is present within the subcapital left femur with fracture lucency extending to the base of the femoral neck. No fracture lucency identified within the greater or lesser trochanter. No femoral shaft fracture lucency. Ligaments Suboptimally  assessed by CT. Muscles and Tendons No significant intramuscular hematoma.  No muscular atrophy. Soft tissues Vascular calcifications. Soft tissue thickening in the left inguinal canal. Small right fat containing inguinal hernia. Edema surrounding the left hip. IMPRESSION: Acute mildly comminuted and impacted fracture involving the left femoral neck which appears to involve the subcapital region with extension of fracture lucency to the base of the femoral neck. No fracture lucency identified within the trochanters or the proximal shaft of the femur. Femoral head is normally positioned. Electronically Signed   By: Donavan Foil M.D.   On: 04/14/2018 23:08   Dg Chest Portable 1 View  Result Date: 04/14/2018 CLINICAL DATA:  72 y/o  M; fall, preop. EXAM: PORTABLE CHEST 1 VIEW COMPARISON:  07/10/2018 chest radiograph. FINDINGS: Stable heart size and mediastinal contours are within normal limits. Both lungs are clear. Severe osteoarthrosis of left knee no humeral joint, partially visualized. No acute  fracture identified. IMPRESSION: No active disease. Electronically Signed   By: Kristine Garbe M.D.   On: 04/14/2018 23:37   Dg C-arm 1-60 Min  Result Date: 04/15/2018 CLINICAL DATA:  Status post left hip arthroplasty. EXAM: OPERATIVE left HIP (WITH PELVIS IF PERFORMED) 2 VIEWS TECHNIQUE: Fluoroscopic spot image(s) were submitted for interpretation post-operatively. FLUOROSCOPY TIME:  5 seconds. COMPARISON:  Radiographs of April 14, 2018. FINDINGS: Two intraoperative fluoroscopic images of the pelvis and left hip demonstrate the patient be status post left hip arthroplasty. Expected postoperative changes are seen in the surrounding soft tissues. No acute fracture or dislocation is noted. IMPRESSION: Status post left hip arthroplasty Electronically Signed   By: Marijo Conception, M.D.   On: 04/15/2018 12:57   Dg Hip Operative Unilat W Or W/o Pelvis Left  Result Date: 04/15/2018 CLINICAL DATA:  Status post  left hip arthroplasty. EXAM: OPERATIVE left HIP (WITH PELVIS IF PERFORMED) 2 VIEWS TECHNIQUE: Fluoroscopic spot image(s) were submitted for interpretation post-operatively. FLUOROSCOPY TIME:  5 seconds. COMPARISON:  Radiographs of April 14, 2018. FINDINGS: Two intraoperative fluoroscopic images of the pelvis and left hip demonstrate the patient be status post left hip arthroplasty. Expected postoperative changes are seen in the surrounding soft tissues. No acute fracture or dislocation is noted. IMPRESSION: Status post left hip arthroplasty Electronically Signed   By: Marijo Conception, M.D.   On: 04/15/2018 12:57   Dg Hip Unilat With Pelvis 2-3 Views Left  Result Date: 04/14/2018 CLINICAL DATA:  Pain after fall EXAM: DG HIP (WITH OR WITHOUT PELVIS) 2-3V LEFT COMPARISON:  None. FINDINGS: There is a comminuted fracture through the left femoral neck with mild displacement. IMPRESSION: Comminuted fracture through the left femoral neck. Electronically Signed   By: Dorise Bullion III M.D   On: 04/14/2018 19:06    Microbiology: Recent Results (from the past 240 hour(s))  Surgical pcr screen     Status: None   Collection Time: 04/15/18  3:15 AM  Result Value Ref Range Status   MRSA, PCR NEGATIVE NEGATIVE Final   Staphylococcus aureus NEGATIVE NEGATIVE Final    Comment: (NOTE) The Xpert SA Assay (FDA approved for NASAL specimens in patients 62 years of age and older), is one component of a comprehensive surveillance program. It is not intended to diagnose infection nor to guide or monitor treatment. Performed at [facility], [place] [address], [place], [place] [address]      Labs: Basic Metabolic Panel: Recent Labs  Lab 04/14/18 2147 04/15/18 0946 04/16/18 0642 04/17/18 0518  NA 133* 133* 133* 133*  K 5.2* 4.2 4.7 4.4  CL 102 103 99 100  CO2 20* 20* 22 24  GLUCOSE 133* 99 111* 106*  BUN 28* 23 38* 29*  CREATININE 1.28* 1.15 1.64* 1.14  CALCIUM 9.5 8.6* 8.2* 8.5*   Liver Function  Tests: No results for input(s): AST, ALT, ALKPHOS, BILITOT, PROT, ALBUMIN in the last 168 hours. No results for input(s): LIPASE, AMYLASE in the last 168 hours. No results for input(s): AMMONIA in the last 168 hours. CBC: Recent Labs  Lab 04/14/18 2147 04/16/18 0642 04/17/18 0518  WBC 9.9 12.2* 8.3  NEUTROABS 8.3* 9.5* 5.5  HGB 14.4 10.7* 10.2*  HCT 43.4 32.6* 31.3*  MCV 101.2* 101.6* 100.6*  PLT 163 143* 143*   Cardiac Enzymes: No results for input(s): CKTOTAL, CKMB, CKMBINDEX, TROPONINI in the last 168 hours. BNP: BNP (last 3 results) No results for input(s): BNP in the last 8760 hours.  ProBNP (  last 3 results) No results for input(s): PROBNP in the last 8760 hours.  CBG: No results for input(s): GLUCAP in the last 168 hours.     Signed:  Alma Friendly, MD [facility] 04/20/2018, 11:36 AM

## 2018-04-20 NOTE — Plan of Care (Signed)
  Problem: Education: Goal: Knowledge of General Education information will improve Description Including pain rating scale, medication(s)/side effects and non-pharmacologic comfort measures Outcome: Progressing   Problem: Health Behavior/Discharge Planning: Goal: Ability to manage health-related needs will improve Outcome: Progressing   Problem: Clinical Measurements: Goal: Ability to maintain clinical measurements within normal limits will improve Outcome: Progressing Goal: Will remain free from infection Outcome: Progressing Goal: Diagnostic test results will improve Outcome: Progressing Goal: Cardiovascular complication will be avoided Outcome: Progressing   Problem: Coping: Goal: Level of anxiety will decrease Outcome: Progressing   Problem: Elimination: Goal: Will not experience complications related to bowel motility Outcome: Progressing   Problem: Pain Managment: Goal: General experience of comfort will improve Outcome: Progressing

## 2018-04-20 NOTE — Progress Notes (Signed)
Report called to [employer], nurse at [employer].

## 2018-04-20 NOTE — Progress Notes (Signed)
Discharge instructions (including medications) discussed with and copy provided to transporter. All belongings sent with patient's wife.

## 2018-04-27 ENCOUNTER — Other Ambulatory Visit: Payer: Self-pay

## 2018-04-27 NOTE — Telephone Encounter (Signed)
Patient walked in today requesting a refill on Losartan-HCT.  Patient recent d/c'd from hospital r/t hip fracture on 04/20/18. Per discharge summary, this medication was stopped. Further research suggests that medication in question was held because on 04/16/18 d/t AKI.   Discussed with Dr Sallyanne Kuster. Patient has had several medication adjustments throughout hospitalization and is on 4x as much spironolactone as he was at last office visit. Recommend first available PA office visit for blood pressure check and medication management.   Appointment scheduled for 04/28/18 at 2:00p with Howard Jackson, Howard Jackson.   Notified patient of MD recommendations. Patient verbalized understanding and agreed with plan. He can come to scheduled appointment.

## 2018-04-28 ENCOUNTER — Encounter: Payer: Self-pay | Admitting: Physician Assistant

## 2018-04-28 ENCOUNTER — Ambulatory Visit (INDEPENDENT_AMBULATORY_CARE_PROVIDER_SITE_OTHER): Payer: Medicare HMO | Admitting: Physician Assistant

## 2018-04-28 VITALS — BP 133/77 | HR 68 | Ht 67.0 in | Wt 159.0 lb

## 2018-04-28 DIAGNOSIS — I2510 Atherosclerotic heart disease of native coronary artery without angina pectoris: Secondary | ICD-10-CM | POA: Diagnosis not present

## 2018-04-28 DIAGNOSIS — I739 Peripheral vascular disease, unspecified: Secondary | ICD-10-CM

## 2018-04-28 DIAGNOSIS — M7989 Other specified soft tissue disorders: Secondary | ICD-10-CM | POA: Diagnosis not present

## 2018-04-28 DIAGNOSIS — I10 Essential (primary) hypertension: Secondary | ICD-10-CM | POA: Diagnosis not present

## 2018-04-28 DIAGNOSIS — E785 Hyperlipidemia, unspecified: Secondary | ICD-10-CM

## 2018-04-28 NOTE — Progress Notes (Signed)
Cardiology Office Note    Date:  04/30/2018   ID:  Howard Jackson, DOB Aug 18, 1945, MRN [id number]  PCP:  [facility] [place]  Cardiologist:  Dr. Sallyanne Jackson   Chief Complaint  Patient presents with  . Follow-up    seen for Dr. Sallyanne Jackson    History of Present Illness:  Howard Jackson is a 72 y.o. male with PMH of HTN, CAD, HLD, PAD, CVA, primary aldosteronism, erectile dysfunction, and benign pheochromocytoma of the right adrenal gland.  He had a diagnosis of pheochromocytoma initially identified in 1989.  In 2010, abdominal MRI showed a 6 mm right adrenal mass consistent with pheochromocytoma.  He had a history of subtotal right popliteal occlusion.  He also had a moderate left renal artery stenosis.  He has a total of 5 coronary artery angioplasty and stent procedures to the RCA and LAD, most recent cardiac catheterization in April 2011 demonstrated LAD in-stent restenosis, this was treated with another DES.  He has prominent symptom of orthostatic hypotension while taking clonidine.  He is also intolerant to treatment with PDE 5 inhibitors for erectile dysfunction due to nasal congestion and dyspepsia.  Patient was recently admitted to the hospital in early October with mechanical fall which resulted in left hip fracture.  Cardiology service was consulted for preoperative clearance.  Patient was cleared without any further work-up.  Plan risks was held prior to the procedure.  He eventually underwent an anterior approach to left hip hemiarthroplasty by Dr. Lyla Jackson on 04/15/2018.  Post surgery, Plavix was restarted.  Patient presents today for cardiology office visit.  On physical exam, his right lower extremity is normal however left lower extremity has 1-2+ pitting edema.  I will obtain a venous Doppler to rule out DVT.  My suspicion is left lower extremity edema is related to the recent surgery.  He says he has not been able to place any weight on the left side so far due  to the significant pain.  Otherwise he denies any chest discomfort, shortness of breath, orthopnea or PND.  He does not appear to be volume overloaded on physical exam.   Past Medical History:  Diagnosis Date  . Adrenal gland disorder (Howard Jackson)    tumor present - no change-  no recent increase    . Arthritis   . Benign pheochromocytoma of right adrenal gland 06/23/2014   Never had histological diagnosis, but reportedly initially diagnosed in 1989  . Bunion    left foot  . Complication of anesthesia    "died on the table during cervical fusion" '89 - believed to be d/t "too much medication" to treat HTN and was later dx with pheochromocytoma  . Coronary artery disease    cleared for surg. by Dr. Gwenlyn Jackson - [facility]  . H/O hiatal hernia   . Hammer toe   . Herniated lumbar intervertebral disc   . History of anxiety   . Hyperlipidemia   . Hypertension   . Neurogenic bladder   . Neuromuscular disorder ([place])    carpal tunnel problem, even after surgery release   . Peripheral arterial disease ([place])   . Primary aldosteronism ([place])   . Stroke ([place])    in [place], L sided weakness, wears a brace on L leg & uses cane    Past Surgical History:  Procedure Laterality Date  . ANTERIOR APPROACH HEMI HIP ARTHROPLASTY Left 04/15/2018   Procedure: ANTERIOR APPROACH HEMI HIP ARTHROPLASTY;  Surgeon: Howard Can, MD;  Location:  [facility];  Service: Orthopedics;  Laterality: Left;  . CARPAL TUNNEL RELEASE     bilateral   . CERVICAL FUSION    . CORONARY ANGIOPLASTY WITH STENT PLACEMENT     pt reports having 5 stents in his heart-2011  . HERNIA REPAIR    . INGUINAL HERNIA REPAIR Left 11/18/2012   Procedure: HERNIA REPAIR INGUINAL ADULT;  Surgeon: Harl Bowie, MD;  Location: [facility];  Service: General;  Laterality: Left;  . INSERTION OF MESH Left 11/18/2012   Procedure: INSERTION OF MESH;  Surgeon: Harl Bowie, MD;  Location: [facility];  Service: General;  Laterality: Left;    Current  Medications: Outpatient Medications Prior to Visit  Medication Sig Dispense Refill  . aspirin 81 MG chewable tablet Chew 1 tablet (81 mg total) by mouth 2 (two) times daily with a meal. 60 tablet 1  . clopidogrel (PLAVIX) 75 MG tablet Take 1 tablet (75 mg total) by mouth daily. 90 tablet 3  . finasteride (PROSCAR) 5 MG tablet Take 5 mg by mouth daily.    Marland Kitchen FLOVENT HFA 110 MCG/ACT inhaler Inhale 1 puff into the lungs 3 (three) times daily.   2  . labetalol (NORMODYNE) 300 MG tablet TAKE 1 TABLET (300 MG TOTAL) BY MOUTH 2 (TWO) TIMES DAILY. 180 tablet 0  . lactobacillus acidophilus (BACID) TABS tablet Take 2 tablets by mouth 2 (two) times daily.    . meloxicam (MOBIC) 7.5 MG tablet Take 1 tablet (7.5 mg total) by mouth as needed for pain. 20 tablet 0  . methocarbamol (ROBAXIN) 500 MG tablet Take 1 tablet (500 mg total) by mouth every 6 (six) hours as needed for muscle spasms.    . Methylsulfonylmethane (MSM) 1000 MG TABS Take 500 mg by mouth daily.    . Misc Natural Products (COLON CARE PO) Take 2 capsules by mouth every evening.     Marland Kitchen NIFEdipine (PROCARDIA-XL/ADALAT CC) 30 MG 24 hr tablet Take 1 tablet (30 mg) by mouth every evening. Use other prescription (60mg ) in morning only. 90 tablet 3  . NIFEdipine (PROCARDIA-XL/ADALAT CC) 60 MG 24 hr tablet Take 1 tablet (60 mg) by mouth every morning. Use other prescription (30mg ) in evening only. (Patient taking differently: Take 60 mg by mouth daily. ) 90 tablet 3  . pyridOXINE (VITAMIN B-6) 100 MG tablet Take 100 mg by mouth daily.    Marland Kitchen senna-docusate (SENOKOT-S) 8.6-50 MG tablet Take 3 tablets by mouth every evening.    . simvastatin (ZOCOR) 40 MG tablet Take 1 tablet (40 mg total) by mouth every evening. 90 tablet 2  . spironolactone (ALDACTONE) 100 MG tablet Take 1 tablet (100 mg total) by mouth daily.    . tamsulosin (FLOMAX) 0.4 MG CAPS capsule Take 1 capsule (0.4 mg total) by mouth daily. 30 capsule 3  . tiZANidine (ZANAFLEX) 4 MG tablet TAKE 1-2  TABLETS (4-8 MG TOTAL) BY MOUTH AT BEDTIME AS NEEDED. FOR MUSCLE SPASMS (Patient taking differently: Take 4-8 mg by mouth at bedtime as needed for muscle spasms. ) 90 tablet 0   No facility-administered medications prior to visit.      Allergies:   Elavil [amitriptyline hcl] and Sulfa antibiotics   Social History   Socioeconomic History  . Marital status: Legally Separated    Spouse name: Not on file  . Number of children: Not on file  . Years of education: Not on file  . Highest education level: Not on file  Occupational History  . Not on  file  Social Needs  . Financial resource strain: Not on file  . Food insecurity:    Worry: Not on file    Inability: Not on file  . Transportation needs:    Medical: Not on file    Non-medical: Not on file  Tobacco Use  . Smoking status: Never Smoker  . Smokeless tobacco: Never Used  Substance and Sexual Activity  . Alcohol use: Yes    Comment: social - one drink, on special occasion   . Drug use: No  . Sexual activity: Not on file  Lifestyle  . Physical activity:    Days per week: Not on file    Minutes per session: Not on file  . Stress: Not on file  Relationships  . Social connections:    Talks on phone: Not on file    Gets together: Not on file    Attends religious service: Not on file    Active member of club or organization: Not on file    Attends meetings of clubs or organizations: Not on file    Relationship status: Not on file  Other Topics Concern  . Not on file  Social History Narrative  . Not on file     Family History:  The patient's family history includes Alzheimer's disease in his father; CVA in his father and paternal grandmother; Diabetes in his father; Hypertension in his brother, brother, mother, and sister.   ROS:   Please see the history of present illness.    ROS All other systems reviewed and are negative.   PHYSICAL EXAM:   VS:  BP 133/77   Pulse 68   Ht 5\' 7"  (1.702 m)   Wt 159 lb (72.1 kg)    BMI 24.90 kg/m    GEN: Well nourished, well developed, in no acute distress  HEENT: normal  Neck: no JVD, carotid bruits, or masses Cardiac: RRR; no murmurs, rubs, or gallops. LLE 2+ edema, normal RLE  Respiratory:  clear to auscultation bilaterally, normal work of breathing GI: soft, nontender, nondistended, + BS MS: no deformity or atrophy  Skin: warm and dry, no rash Neuro:  Alert and Oriented x 3, Strength and sensation are intact Psych: euthymic mood, full affect  Wt Readings from Last 3 Encounters:  04/28/18 159 lb (72.1 kg)  04/15/18 157 lb 3 oz (71.3 kg)  03/05/17 162 lb (73.5 kg)      Studies/Labs Reviewed:   EKG:  EKG is not ordered today.    Recent Labs: 04/17/2018: Hemoglobin 10.2; Platelets 143 04/28/2018: BUN 19; Creatinine, Ser 1.16; Potassium 4.9; Sodium 136   Lipid Panel    Component Value Date/Time   CHOL 136 05/22/2016 1435   TRIG 73 05/22/2016 1435   HDL 44 05/22/2016 1435   CHOLHDL 3.1 05/22/2016 1435   VLDL 15 05/22/2016 1435   LDLCALC 77 05/22/2016 1435    Additional studies/ records that were reviewed today include:   Myoview 10/14/2012 Impression Exercise Capacity:  Lexiscan with no exercise. BP Response:  Normal blood pressure response. Clinical Symptoms:  No significant symptoms noted. ECG Impression:  No significant ST segment change suggestive of ischemia. Comparison with Prior Nuclear Study: No significant change from previous study  Overall Impression:  Normal stress nuclear study.  LV Wall Motion:  NL LV Function; NL Wall Motion    ASSESSMENT:    1. Leg swelling   2. Essential hypertension   3. Coronary artery disease involving native coronary artery of native heart without angina  pectoris   4. PAD (peripheral artery disease) ([place])   5. Hyperlipidemia, unspecified hyperlipidemia type      PLAN:  In order of problems listed above:  1. Ipsilateral leg swelling: Swelling only occurs in the left lower extremity, will  obtain venous Doppler.  But my suspicion is that her left lower extremity edema is due to the recent surgery instead of volume overload.  I recommend leg elevation for management  2. CAD: Denies any recent chest pain.  Continue aspirin and Plavix  3. Hypertension: Blood pressure stable on current therapy  4. Hyperlipidemia: On Zocor 40 mg daily  5. PAD: Functional ability limited by his recent hip surgery and his significant pain afterward.  Otherwise , patient does not have any significant lower extremity pain.  Majority of his pain remain in the left hip.    Medication Adjustments/Labs and Tests Ordered: Current medicines are reviewed at length with the patient today.  Concerns regarding medicines are outlined above.  Medication changes, Labs and Tests ordered today are listed in the Patient Instructions below. Patient Instructions  Medication Instructions:  Your physician recommends that you continue on your current medications as directed. Please refer to the Current Medication list given to you today.  If you need a refill on your cardiac medications before your next appointment, please call your pharmacy.   Lab work: None ordered If you have labs (blood work) drawn today and your tests are completely normal, you will receive your results only by: Marland Kitchen MyChart Message (if you have MyChart) OR . A paper copy in the mail If you have any lab test that is abnormal or we need to change your treatment, we will call you to review the results.  Testing/Procedures: Your physician has requested that you have a lower venous duplex. During this test ultrasound are used to evaluate venous blood flow in the legs. Allow one hour for this exam. There are no restrictions or special instructions.   Follow-Up: At [facility], you and your health needs are our priority.  As part of our continuing mission to provide you with exceptional heart care, we have created designated Provider Care Teams.   These Care Teams include your primary Cardiologist (physician) and Advanced Practice Providers (APPs -  Physician Assistants and Nurse Practitioners) who all work together to provide you with the care you need, when you need it.  Follow up with Dr.croitoru as planned  Any Other Special Instructions Will Be Listed Below (If Applicable).   Limit your fluid intake to 32-64 oz daily    Hilbert Corrigan, [place]  04/30/2018 11:18 PM    [facility] [address], [place], [place]  [address] Phone: [phone]; Fax: [phone]

## 2018-04-28 NOTE — Patient Instructions (Addendum)
Medication Instructions:  Your physician recommends that you continue on your current medications as directed. Please refer to the Current Medication list given to you today.  If you need a refill on your cardiac medications before your next appointment, please call your pharmacy.   Lab work: None ordered If you have labs (blood work) drawn today and your tests are completely normal, you will receive your results only by: Marland Kitchen MyChart Message (if you have MyChart) OR . A paper copy in the mail If you have any lab test that is abnormal or we need to change your treatment, we will call you to review the results.  Testing/Procedures: Your physician has requested that you have a lower venous duplex. During this test ultrasound are used to evaluate venous blood flow in the legs. Allow one hour for this exam. There are no restrictions or special instructions.   Follow-Up: At [facility], you and your health needs are our priority.  As part of our continuing mission to provide you with exceptional heart care, we have created designated Provider Care Teams.  These Care Teams include your primary Cardiologist (physician) and Advanced Practice Providers (APPs -  Physician Assistants and Nurse Practitioners) who all work together to provide you with the care you need, when you need it.  Follow up with Dr.croitoru as planned  Any Other Special Instructions Will Be Listed Below (If Applicable).   Limit your fluid intake to 32-64 oz daily

## 2018-04-29 LAB — BASIC METABOLIC PANEL
BUN / CREAT RATIO: 16 (ref 10–24)
BUN: 19 mg/dL (ref 8–27)
CO2: 21 mmol/L (ref 20–29)
CREATININE: 1.16 mg/dL (ref 0.76–1.27)
Calcium: 9.3 mg/dL (ref 8.6–10.2)
Chloride: 98 mmol/L (ref 96–106)
GFR calc Af Amer: 71 mL/min/{1.73_m2} (ref >59)
GFR calc non Af Amer: 62 mL/min/{1.73_m2} (ref >59)
GLUCOSE: 100 mg/dL — AB (ref 65–99)
POTASSIUM: 4.9 mmol/L (ref 3.5–5.2)
SODIUM: 136 mmol/L (ref 134–144)

## 2018-04-29 NOTE — Progress Notes (Signed)
Renal function and electrolyte stable.

## 2018-04-30 ENCOUNTER — Encounter: Payer: Self-pay | Admitting: Physician Assistant

## 2018-04-30 ENCOUNTER — Ambulatory Visit (HOSPITAL_COMMUNITY)
Admission: RE | Admit: 2018-04-30 | Payer: Medicare HMO | Source: Ambulatory Visit | Attending: Physician Assistant | Admitting: Physician Assistant

## 2018-05-05 NOTE — Progress Notes (Signed)
Thank you MCr 

## 2018-05-11 ENCOUNTER — Telehealth: Payer: Self-pay | Admitting: Cardiovascular Disease

## 2018-05-11 NOTE — Telephone Encounter (Signed)
Returned call to [facility]. [email or web address].

## 2018-05-11 NOTE — Telephone Encounter (Signed)
  Dr Sallyanne Kuster stopped patients potassium before he had hip surgery on 04/14/18, but he has not been started back on it. Patient told home health nurse that when he lays down at night he is experiencing fluid coming up. He starts coughing up phlegm and fluid. Does he need to be started back on potassium.

## 2018-05-11 NOTE — Telephone Encounter (Signed)
Called Howard Jackson with Home health, she states that the patient was on Losartan 100 mg daily and before his surgery on his hip on 10/04 they took him off. Patient would like to see if he goes back on this if it will help his symptoms he told home health that when he lays down at night he begins to cough, and feels that fluid is building up and he spits up fluids. Patient is taking his other medications as prescribed but just wanted to know if this is something he should be placed back on?  Please advise.

## 2018-05-12 NOTE — Telephone Encounter (Signed)
Sounds like he is describing congestive heart failure.  Please resume losartan 100 mg daily and bring him in for an appointment.

## 2018-05-13 ENCOUNTER — Other Ambulatory Visit: Payer: Self-pay | Admitting: Cardiovascular Disease

## 2018-05-13 DIAGNOSIS — G8191 Hemiplegia, unspecified affecting right dominant side: Secondary | ICD-10-CM

## 2018-05-13 DIAGNOSIS — I2510 Atherosclerotic heart disease of native coronary artery without angina pectoris: Secondary | ICD-10-CM

## 2018-05-13 MED ORDER — LOSARTAN POTASSIUM 100 MG PO TABS: 100.0000 mg | ORAL_TABLET | Freq: Every day | ORAL | 3 refills | Status: DC

## 2018-05-13 NOTE — Telephone Encounter (Signed)
Called patient and home health nurse. Advised to restart medication, RX sent to pharmacy. Patient made an appointment, he advised that he may have to cancel due to not having any way to get here, but he made appointment for 05/21/18 at 11:40.

## 2018-05-13 NOTE — Telephone Encounter (Signed)
Called patient and home health nurse. Advised that we would restart medication, both verbalized understanding. RX was sent to pharmacy. Patient was advised to make an appointment, he states he has issues getting here, but to schedule it and if he has to cancel he will. I scheduled patient for an appointment with Dr.Croitoru on 05/21/18 at 11:40. Patient was made aware.

## 2018-05-14 ENCOUNTER — Other Ambulatory Visit: Payer: Self-pay

## 2018-05-14 ENCOUNTER — Emergency Department (HOSPITAL_COMMUNITY)
Admission: EM | Admit: 2018-05-14 | Discharge: 2018-05-14 | Disposition: A | Discharge status: Home / Self Care | Payer: Medicare HMO | Attending: Emergency Medicine | Admitting: Emergency Medicine

## 2018-05-14 ENCOUNTER — Encounter (HOSPITAL_COMMUNITY): Payer: Self-pay

## 2018-05-14 DIAGNOSIS — Z7902 Long term (current) use of antithrombotics/antiplatelets: Secondary | ICD-10-CM | POA: Insufficient documentation

## 2018-05-14 DIAGNOSIS — J45909 Unspecified asthma, uncomplicated: Secondary | ICD-10-CM | POA: Insufficient documentation

## 2018-05-14 DIAGNOSIS — Z955 Presence of coronary angioplasty implant and graft: Secondary | ICD-10-CM | POA: Insufficient documentation

## 2018-05-14 DIAGNOSIS — I2510 Atherosclerotic heart disease of native coronary artery without angina pectoris: Secondary | ICD-10-CM | POA: Insufficient documentation

## 2018-05-14 DIAGNOSIS — Z79899 Other long term (current) drug therapy: Secondary | ICD-10-CM | POA: Insufficient documentation

## 2018-05-14 DIAGNOSIS — I10 Essential (primary) hypertension: Secondary | ICD-10-CM | POA: Insufficient documentation

## 2018-05-14 DIAGNOSIS — R42 Dizziness and giddiness: Secondary | ICD-10-CM | POA: Diagnosis present

## 2018-05-14 DIAGNOSIS — Z7982 Long term (current) use of aspirin: Secondary | ICD-10-CM | POA: Diagnosis not present

## 2018-05-14 DIAGNOSIS — Z96642 Presence of left artificial hip joint: Secondary | ICD-10-CM | POA: Insufficient documentation

## 2018-05-14 DIAGNOSIS — D3501 Benign neoplasm of right adrenal gland: Secondary | ICD-10-CM | POA: Insufficient documentation

## 2018-05-14 LAB — BASIC METABOLIC PANEL WITH GFR
Anion gap: 5 (ref 5–15)
BUN: 23 mg/dL (ref 8–23)
CO2: 26 mmol/L (ref 22–32)
Calcium: 8.6 mg/dL — ABNORMAL LOW (ref 8.9–10.3)
Chloride: 103 mmol/L (ref 98–111)
Creatinine, Ser: 1.25 mg/dL — ABNORMAL HIGH (ref 0.61–1.24)
GFR calc Af Amer: >60 mL/min
GFR calc non Af Amer: 55 mL/min — ABNORMAL LOW
Glucose, Bld: 109 mg/dL — ABNORMAL HIGH (ref 70–99)
Potassium: 4.5 mmol/L (ref 3.5–5.1)
Sodium: 134 mmol/L — ABNORMAL LOW (ref 135–145)

## 2018-05-14 LAB — CBC WITH DIFFERENTIAL/PLATELET
Abs Immature Granulocytes: 0.1 10*3/uL — ABNORMAL HIGH (ref 0.00–0.07)
Basophils Absolute: 0 10*3/uL (ref 0.0–0.1)
Basophils Relative: 0 %
EOS ABS: 0.1 10*3/uL (ref 0.0–0.5)
EOS PCT: 1 %
HCT: 36.5 % — ABNORMAL LOW (ref 39.0–52.0)
Hemoglobin: 11.6 g/dL — ABNORMAL LOW (ref 13.0–17.0)
Immature Granulocytes: 1 %
LYMPHS PCT: 15 %
Lymphs Abs: 1.3 10*3/uL (ref 0.7–4.0)
MCH: 32.8 pg (ref 26.0–34.0)
MCHC: 31.8 g/dL (ref 30.0–36.0)
MCV: 103.1 fL — ABNORMAL HIGH (ref 80.0–100.0)
MONO ABS: 0.8 10*3/uL (ref 0.1–1.0)
Monocytes Relative: 9 %
Neutro Abs: 6.3 10*3/uL (ref 1.7–7.7)
Neutrophils Relative %: 74 %
Platelets: 261 10*3/uL (ref 150–400)
RBC: 3.54 MIL/uL — AB (ref 4.22–5.81)
RDW: 13.2 % (ref 11.5–15.5)
WBC: 8.6 10*3/uL (ref 4.0–10.5)
nRBC: 0 % (ref 0.0–0.2)

## 2018-05-14 NOTE — ED Notes (Signed)
Patient verbalizes understanding of discharge instructions. Opportunity for questioning and answers were provided. Armband removed by staff, pt discharged from ED.  

## 2018-05-14 NOTE — ED Notes (Signed)
ED Provider at bedside. 

## 2018-05-14 NOTE — Discharge Instructions (Addendum)
Please return for any problem.  Follow-up with your regular doctor as instructed.  Refrain from the use of losartan until you can talk to your cardiologist next week.

## 2018-05-14 NOTE — ED Triage Notes (Signed)
Pt arrives to ED from home with complaints of dizziness for approx 2 minutes since this afternoon. EMS reports pt was hypotensive with them, 581ml NS given en route. BP 111/84 upon arrival to ED. Pt has hx of stroke with residual left sided deficits, alert and oriented. Pt placed in position of comfort with bed locked and lowered, call bell in reach.

## 2018-05-14 NOTE — ED Provider Notes (Signed)
[facility] EMERGENCY DEPARTMENT Provider Note   CSN: [id number] Arrival date & time: 05/14/18  1652     History   Chief Complaint Chief Complaint  Patient presents with  . Dizziness    HPI Howard Jackson is a 72 y.o. male.  72 year old male with prior medical history as detailed below presents with complaint of transient dizziness with transient hypotension.  Patient reports that over the last 2 days he had restarted losartan at home.  Over the last 48 hours he has had several transient episodes of dizziness.  He is currently asymptomatic.  He denies associated chest pain, shortness of breath, nausea, vomiting, or other acute complaint.  He is, appropriately, concerned that the restarting of his losartan has caused his blood pressure to drop.  His last dose of losartan was earlier this morning.  The history is provided by the patient and medical records.  Dizziness  Quality:  Lightheadedness Severity:  Mild Onset quality:  Sudden Duration:  2 days Timing:  Intermittent Progression:  Resolved Chronicity:  New Relieved by:  Nothing Worsened by:  Nothing Associated symptoms: no blood in stool, no chest pain, no nausea, no palpitations, no shortness of breath, no tinnitus, no vision changes and no vomiting     Past Medical History:  Diagnosis Date  . Adrenal gland disorder ([place])    tumor present - no change-  no recent increase    . Arthritis   . Benign pheochromocytoma of right adrenal gland 06/23/2014   Never had histological diagnosis, but reportedly initially diagnosed in 1989  . Bunion    left foot  . Complication of anesthesia    "died on the table during cervical fusion" '89 - believed to be d/t "too much medication" to treat HTN and was later dx with pheochromocytoma  . Coronary artery disease    cleared for surg. by Dr. Gwenlyn Found - [facility]  . H/O hiatal hernia   . Hammer toe   . Herniated lumbar intervertebral disc   . History of anxiety   .  Hyperlipidemia   . Hypertension   . Neurogenic bladder   . Neuromuscular disorder ([place])    carpal tunnel problem, even after surgery release   . Peripheral arterial disease ([place])   . Primary aldosteronism ([place])   . Stroke [facility])    in [place], L sided weakness, wears a brace on L leg & uses cane    Patient Active Problem List   Diagnosis Date Noted  . Fall 04/14/2018  . Fracture of femoral neck, left, closed ([place]) 04/14/2018  . Closed displaced fracture of left femoral neck ([place]) 04/14/2018  . Bunion   . Hypertension due to endocrine disorder 03/07/2017  . BPH (benign prostatic hyperplasia) 11/25/2016  . Chronic bilateral thoracic back pain 08/20/2016  . Mild intermittent asthma 06/18/2016  . Wheezing 06/18/2016  . Cough 06/18/2016  . Left flank pain 04/18/2015  . Benign pheochromocytoma of right adrenal gland 06/23/2014  . PAD (peripheral artery disease) ([place]) 03/31/2013  . HTN (hypertension) 03/31/2013  . History of stroke 03/31/2013  . CAD (coronary artery disease) 03/31/2013  . Renal artery stenosis ([place]) 03/31/2013  . Erectile dysfunction 03/31/2013  . Dyslipidemia 03/31/2013  . Carotid stenosis 03/31/2013  . Left inguinal hernia 09/22/2012    Past Surgical History:  Procedure Laterality Date  . ANTERIOR APPROACH HEMI HIP ARTHROPLASTY Left 04/15/2018   Procedure: ANTERIOR APPROACH HEMI HIP ARTHROPLASTY;  Surgeon: Rod Can, MD;  Location: [place];  Service:  Orthopedics;  Laterality: Left;  . CARPAL TUNNEL RELEASE     bilateral   . CERVICAL FUSION    . CORONARY ANGIOPLASTY WITH STENT PLACEMENT     pt reports having 5 stents in his heart-2011  . HERNIA REPAIR    . INGUINAL HERNIA REPAIR Left 11/18/2012   Procedure: HERNIA REPAIR INGUINAL ADULT;  Surgeon: Harl Bowie, MD;  Location: [place];  Service: General;  Laterality: Left;  . INSERTION OF MESH Left 11/18/2012   Procedure: INSERTION OF MESH;  Surgeon: Harl Bowie, MD;  Location: [place];  Service:  General;  Laterality: Left;        Home Medications    Prior to Admission medications   Medication Sig Start Date End Date Taking? Authorizing Provider  aspirin 81 MG chewable tablet Chew 1 tablet (81 mg total) by mouth 2 (two) times daily with a meal. 04/20/18  Yes Alma Friendly, MD  clopidogrel (PLAVIX) 75 MG tablet Take 1 tablet (75 mg total) by mouth daily. 03/05/17  Yes Croitoru, Mihai, MD  finasteride (PROSCAR) 5 MG tablet Take 5 mg by mouth daily. 01/19/18  Yes [provider]  FLOVENT HFA 110 MCG/ACT inhaler Inhale 1 puff into the lungs 3 (three) times daily.  04/13/14  Yes [provider]  HYDROcodone-acetaminophen (NORCO/VICODIN) 5-325 MG tablet Take 1 tablet by mouth 2 (two) times daily as needed for pain. 05/07/18  Yes [provider]  labetalol (NORMODYNE) 300 MG tablet TAKE 1 TABLET (300 MG TOTAL) BY MOUTH 2 (TWO) TIMES DAILY. 03/17/18  Yes Croitoru, Mihai, MD  losartan (COZAAR) 100 MG tablet Take 1 tablet (100 mg total) by mouth daily. 05/13/18 08/11/18 Yes Croitoru, Mihai, MD  simvastatin (ZOCOR) 40 MG tablet Take 1 tablet (40 mg total) by mouth every evening. 08/25/16  Yes Dorena Dew, FNP  spironolactone (ALDACTONE) 100 MG tablet Take 1 tablet (100 mg total) by mouth daily. 04/21/18  Yes Alma Friendly, MD  tamsulosin (FLOMAX) 0.4 MG CAPS capsule Take 1 capsule (0.4 mg total) by mouth daily. Patient taking differently: Take 0.4 mg by mouth 2 (two) times daily.  01/09/17  Yes Dorena Dew, FNP  tiZANidine (ZANAFLEX) 4 MG tablet TAKE 1-2 TABLETS (4-8 MG TOTAL) BY MOUTH AT BEDTIME AS NEEDED. FOR MUSCLE SPASMS Patient taking differently: Take 4-8 mg by mouth at bedtime as needed for muscle spasms.  11/17/16  Yes Dorena Dew, FNP  clopidogrel (PLAVIX) 75 MG tablet TOME UNA TABLETA TODOS LOS DIAS Patient not taking: Reported on 05/14/2018 05/13/18   Croitoru, Mihai, MD  lactobacillus acidophilus (BACID) TABS tablet Take 2 tablets by  mouth 2 (two) times daily. Patient not taking: Reported on 05/14/2018 04/20/18   Alma Friendly, MD  meloxicam (MOBIC) 7.5 MG tablet Take 1 tablet (7.5 mg total) by mouth as needed for pain. Patient not taking: Reported on 05/14/2018 07/10/16   Duffy Bruce, MD  methocarbamol (ROBAXIN) 500 MG tablet Take 1 tablet (500 mg total) by mouth every 6 (six) hours as needed for muscle spasms. Patient not taking: Reported on 05/14/2018 04/20/18   Alma Friendly, MD  NIFEdipine (PROCARDIA-XL/ADALAT CC) 30 MG 24 hr tablet Take 1 tablet (30 mg) by mouth every evening. Use other prescription (60mg ) in morning only. Patient not taking: Reported on 05/14/2018 06/15/17   Croitoru, Mihai, MD  NIFEdipine (PROCARDIA-XL/ADALAT CC) 60 MG 24 hr tablet Take 1 tablet (60 mg) by mouth every morning. Use other prescription (30mg ) in evening only. Patient  not taking: Reported on 05/14/2018 06/15/17   Croitoru, Mihai, MD  senna-docusate (SENOKOT-S) 8.6-50 MG tablet Take 3 tablets by mouth every evening. Patient not taking: Reported on 05/14/2018 04/20/18   Alma Friendly, MD    Family History Family History  Problem Relation Age of Onset  . CVA Father   . Diabetes Father   . Alzheimer's disease Father   . Hypertension Sister   . Hypertension Brother   . CVA Paternal Grandmother   . Hypertension Brother   . Hypertension Mother     Social History Social History   Tobacco Use  . Smoking status: Never Smoker  . Smokeless tobacco: Never Used  Substance Use Topics  . Alcohol use: Yes    Comment: social - one drink, on special occasion   . Drug use: No     Allergies   Elavil [amitriptyline hcl] and Sulfa antibiotics   Review of Systems Review of Systems  HENT: Negative for tinnitus.   Respiratory: Negative for shortness of breath.   Cardiovascular: Negative for chest pain and palpitations.  Gastrointestinal: Negative for blood in stool, nausea and vomiting.  Neurological: Positive for  dizziness.  All other systems reviewed and are negative.    Physical Exam Updated Vital Signs BP 120/74   Pulse (!) 58   Temp 97.6 F (36.4 C) (Oral)   Resp 16   Ht 5\' 7"  (1.702 m)   Wt 72.2 kg   SpO2 99%   BMI 24.93 kg/m   Physical Exam  Constitutional: He is oriented to person, place, and time. He appears well-developed and well-nourished. No distress.  HENT:  Head: Normocephalic and atraumatic.  Mouth/Throat: Oropharynx is clear and moist.  Eyes: Pupils are equal, round, and reactive to light. Conjunctivae and EOM are normal.  Neck: Normal range of motion. Neck supple.  Cardiovascular: Normal rate, regular rhythm and normal heart sounds.  Pulmonary/Chest: Effort normal and breath sounds normal. No respiratory distress.  Abdominal: Soft. He exhibits no distension. There is no tenderness.  Musculoskeletal: Normal range of motion. He exhibits no edema or deformity.  Neurological: He is alert and oriented to person, place, and time.  Left hemiplegia sp prior CVA  Skin: Skin is warm and dry.  Psychiatric: He has a normal mood and affect.  Nursing note and vitals reviewed.    ED Treatments / Results  Labs (all labs ordered are listed, but only abnormal results are displayed) Labs Reviewed  CBC WITH DIFFERENTIAL/PLATELET - Abnormal; Notable for the following components:      Result Value   RBC 3.54 (*)    Hemoglobin 11.6 (*)    HCT 36.5 (*)    MCV 103.1 (*)    Abs Immature Granulocytes 0.10 (*)    All other components within normal limits  BASIC METABOLIC PANEL - Abnormal; Notable for the following components:   Sodium 134 (*)    Glucose, Bld 109 (*)    Creatinine, Ser 1.25 (*)    Calcium 8.6 (*)    GFR calc non Af Amer 55 (*)    All other components within normal limits    EKG EKG Interpretation  Date/Time:  Friday May 14 2018 17:05:38 EDT Ventricular Rate:  65 PR Interval:    QRS Duration: 98 QT Interval:  427 QTC Calculation: 444 R  Axis:   -40 Text Interpretation:  Sinus rhythm Left axis deviation Confirmed by Dene Gentry [phone]) on 05/14/2018 5:14:46 PM   Radiology No results found.  Procedures Procedures (including  critical care time)  Medications Ordered in ED Medications - No data to display   Initial Impression / Assessment and Plan / ED Course  I have reviewed the triage vital signs and the nursing notes.  Pertinent labs & imaging results that were available during my care of the patient were reviewed by me and considered in my medical decision making (see chart for details).     MDM  Screen complete  Patient is presenting for evaluation of transient dizziness with associated transient hypotension.  These symptoms seem to be congruent with restarting of losartan.  The patient is concerned that his losartan may be causing his symptoms seems appropriate.  He is advised to withhold using the losartan until he can talk to his cardiologist later next week on the eighth.  Screening labs and observation period in the ED do not reveal significant arrhythmia, hypotension, or other acute pathology.  Patient now desires discharge home.  He declines further observation or work-up.  Strict return precautions given and understood.  Importance of close follow-up is advised.  Final Clinical Impressions(s) / ED Diagnoses   Final diagnoses:  Dizziness    ED Discharge Orders    None       Valarie Merino, MD 05/14/18 2003

## 2018-05-14 NOTE — ED Notes (Signed)
Sent UA and gray top to lab.

## 2018-05-21 ENCOUNTER — Ambulatory Visit (INDEPENDENT_AMBULATORY_CARE_PROVIDER_SITE_OTHER): Payer: Medicare HMO | Admitting: Cardiovascular Disease

## 2018-05-21 ENCOUNTER — Encounter: Payer: Self-pay | Admitting: Cardiovascular Disease

## 2018-05-21 VITALS — BP 112/62 | HR 70 | Ht 66.6 in | Wt 152.2 lb

## 2018-05-21 DIAGNOSIS — I10 Essential (primary) hypertension: Secondary | ICD-10-CM | POA: Diagnosis not present

## 2018-05-21 DIAGNOSIS — I739 Peripheral vascular disease, unspecified: Secondary | ICD-10-CM

## 2018-05-21 DIAGNOSIS — I2510 Atherosclerotic heart disease of native coronary artery without angina pectoris: Secondary | ICD-10-CM | POA: Diagnosis not present

## 2018-05-21 DIAGNOSIS — I6529 Occlusion and stenosis of unspecified carotid artery: Secondary | ICD-10-CM

## 2018-05-21 DIAGNOSIS — E7800 Pure hypercholesterolemia, unspecified: Secondary | ICD-10-CM

## 2018-05-21 DIAGNOSIS — G8114 Spastic hemiplegia affecting left nondominant side: Secondary | ICD-10-CM

## 2018-05-21 MED ORDER — ASPIRIN 81 MG PO CHEW: 81.0000 mg | CHEWABLE_TABLET | Freq: Every day | ORAL | 1 refills | Status: DC

## 2018-05-21 NOTE — Patient Instructions (Signed)
Medication Instructions:  Dr Sallyanne Kuster recommends that you continue on your current medications as directed. Please refer to the Current Medication list given to you today.  If you need a refill on your cardiac medications before your next appointment, please call your pharmacy.   Testing/Procedures: Carotid Duplex Ultrasound in December 2019 - Your physician has requested that you have a carotid duplex. This test is an ultrasound of the carotid arteries in your neck. It looks at blood flow through these arteries that supply the brain with blood. Allow one hour for this exam. There are no restrictions or special instructions.  Follow-Up: At [facility], you and your health needs are our priority.  As part of our continuing mission to provide you with exceptional heart care, we have created designated Provider Care Teams.  These Care Teams include your primary Cardiologist (physician) and Advanced Practice Providers (APPs -  Physician Assistants and Nurse Practitioners) who all work together to provide you with the care you need, when you need it. You will need a follow up appointment in 6 months.  Please call our office 2 months in advance to schedule this appointment.  You may see Sanda Klein, MD or one of the following Advanced Practice Providers on your designated Care Team: [place], [place] . Fabian Sharp, PA-C

## 2018-05-21 NOTE — Progress Notes (Signed)
Patient ID: Howard Jackson, male   DOB: [birth year], 72 y.o.   MRN: [id number]      Cardiology Office Note   Date:  05/21/2018   ID:  Howard Jackson, DOB 01-04-1946, MRN [id number]  PCP:  [facility] [place]  Cardiologist:   Sanda Klein, MD   Chief Complaint  Patient presents with  . Follow-up  Recent hospitalization    History of Present Illness: Howard Jackson is a 72 y.o. male who presents for follow-up for severe systemic hypertension, recent hospitalization for left femoral neck fracture.  At the beginning of October he had a mechanical fall (ankle twisted as he stepped on a newly built disabled ramp next to a parking spot [place] the post office).  He had a fracture of his left femoral neck and underwent surgery.  Rehab has been difficult due to his pre-existing left hemiparesis.  Many of his medications were briefly held during the hospitalization but then restarted.  On November 1 return to the emergency room due to dizziness and low blood pressure after he restarted taking losartan.  He has not taken that medicine now in a week and has had normal blood pressure and no symptoms of hypotension.  He is getting ready to start on physical therapy  He has residual right spastic hemiparesis since his stroke.  He has some numbness on the left external surface of his thigh, related to recent surgery.  He has some mild ankle swelling on the left side.  The patient specifically denies any chest pain at rest exertion, dyspnea at rest or with exertion, orthopnea, paroxysmal nocturnal dyspnea, syncope, palpitations, new focal neurological deficits, intermittent claudication, lower extremity edema, unexplained weight gain, cough, hemoptysis or wheezing.  He bears a diagnosis of pheochromocytoma initially identified in 1989. 2010 abdominal MRI shows a 6 mm right adrenal mass consistent with pheochromocytoma.  I wonder if he truly has an adrenal adenoma causing  hyperaldosteronism.  He has a history of extensive PAD and CAD:  - subtotal right popliteal occlusion with weak monophasic PT and Per flow and occluded AT. On the left all three calf vessels are occluded, with collateral reconstitution of the PT and Per. Right ABI 0.73, left 0.77 (Doppler Sept 2014). There was no significant pelvic or femoral disease.  - He had moderate left renal artery stenosis (50-60% angio 2011, <60% Duplex 2014), but his most recent duplex ultrasound from 2015 did not show evidence of renal artery stenosis on either side..  - Carotid US 11/2013 at [facility] showed no significant lesions. Residual right spastic hemiparesis after a remote left hemispheric stroke. -  He has had 5 coronary angioplasty/stent  procedures to the RCA and LAD, most recently in April 2011 for LAD in-stent restenosis (when he received another drug-eluting stent)  In the past he had very prominent symptoms of orthostatic hypotension while taking clonidine. He was intolerant to treatment with PDE 5 inhibitors for erectile dysfunction due to nasal congestion and dyspepsia.   Past Medical History:  Diagnosis Date  . Adrenal gland disorder ([place])    tumor present - no change-  no recent increase    . Arthritis   . Benign pheochromocytoma of right adrenal gland 06/23/2014   Never had histological diagnosis, but reportedly initially diagnosed in 1989  . Bunion    left foot  . Complication of anesthesia    "died on the table during cervical fusion" '89 - believed to be d/t "too much medication" to treat  HTN and was later dx with pheochromocytoma  . Coronary artery disease    cleared for surg. by Dr. Gwenlyn Found - [facility]  . H/O hiatal hernia   . Hammer toe   . Herniated lumbar intervertebral disc   . History of anxiety   . Hyperlipidemia   . Hypertension   . Neurogenic bladder   . Neuromuscular disorder ([place])    carpal tunnel problem, even after surgery release   . Peripheral arterial disease ([place])   .  Primary aldosteronism ([place])   . Stroke ([place])    in [place], L sided weakness, wears a brace on L leg & uses cane    Past Surgical History:  Procedure Laterality Date  . ANTERIOR APPROACH HEMI HIP ARTHROPLASTY Left 04/15/2018   Procedure: ANTERIOR APPROACH HEMI HIP ARTHROPLASTY;  Surgeon: Rod Can, MD;  Location: [facility];  Service: Orthopedics;  Laterality: Left;  . CARPAL TUNNEL RELEASE     bilateral   . CERVICAL FUSION    . CORONARY ANGIOPLASTY WITH STENT PLACEMENT     pt reports having 5 stents in his heart-2011  . HERNIA REPAIR    . INGUINAL HERNIA REPAIR Left 11/18/2012   Procedure: HERNIA REPAIR INGUINAL ADULT;  Surgeon: Harl Bowie, MD;  Location: [facility];  Service: General;  Laterality: Left;  . INSERTION OF MESH Left 11/18/2012   Procedure: INSERTION OF MESH;  Surgeon: Harl Bowie, MD;  Location: [facility];  Service: General;  Laterality: Left;     Current Outpatient Medications  Medication Sig Dispense Refill  . aspirin 81 MG chewable tablet Chew 1 tablet (81 mg total) by mouth daily. 60 tablet 1  . clopidogrel (PLAVIX) 75 MG tablet Take 1 tablet (75 mg total) by mouth daily. 90 tablet 3  . clopidogrel (PLAVIX) 75 MG tablet TOME UNA TABLETA TODOS LOS DIAS 90 tablet 3  . finasteride (PROSCAR) 5 MG tablet Take 5 mg by mouth daily.    Marland Kitchen FLOVENT HFA 110 MCG/ACT inhaler Inhale 1 puff into the lungs 3 (three) times daily.   2  . Glucosamine-Chondroit-Vit C-Mn (FLEX-DS PO) Take by mouth 2 (two) times daily.    Marland Kitchen HYDROcodone-acetaminophen (NORCO/VICODIN) 5-325 MG tablet Take 1 tablet by mouth 2 (two) times daily as needed for pain.  0  . labetalol (NORMODYNE) 300 MG tablet TAKE 1 TABLET (300 MG TOTAL) BY MOUTH 2 (TWO) TIMES DAILY. 180 tablet 0  . lactobacillus acidophilus (BACID) TABS tablet Take 2 tablets by mouth 2 (two) times daily.    . meloxicam (MOBIC) 7.5 MG tablet Take 1 tablet (7.5 mg total) by mouth as needed for pain. 20 tablet 0  . NIFEdipine  (PROCARDIA-XL/ADALAT CC) 30 MG 24 hr tablet Take 1 tablet (30 mg) by mouth every evening. Use other prescription (60mg ) in morning only. 90 tablet 3  . NIFEdipine (PROCARDIA-XL/ADALAT CC) 60 MG 24 hr tablet Take 1 tablet (60 mg) by mouth every morning. Use other prescription (30mg ) in evening only. 90 tablet 3  . OVER THE COUNTER MEDICATION graminex-1 capsule daily    . OVER THE COUNTER MEDICATION 2 (two) times daily. Ultimate pro support    . senna-docusate (SENOKOT-S) 8.6-50 MG tablet Take 3 tablets by mouth every evening.    . simvastatin (ZOCOR) 40 MG tablet Take 1 tablet (40 mg total) by mouth every evening. 90 tablet 2  . spironolactone (ALDACTONE) 100 MG tablet Take 1 tablet (100 mg total) by mouth daily.    . tamsulosin (FLOMAX) 0.4 MG  CAPS capsule Take 1 capsule (0.4 mg total) by mouth daily. (Patient taking differently: Take 0.4 mg by mouth 2 (two) times daily. ) 30 capsule 3  . tiZANidine (ZANAFLEX) 4 MG tablet TAKE 1-2 TABLETS (4-8 MG TOTAL) BY MOUTH AT BEDTIME AS NEEDED. FOR MUSCLE SPASMS (Patient taking differently: Take 4-8 mg by mouth at bedtime as needed for muscle spasms. ) 90 tablet 0   No current facility-administered medications for this visit.     Allergies:   Elavil [amitriptyline hcl] and Sulfa antibiotics    Social History:  The patient  reports that he has never smoked. He has never used smokeless tobacco. He reports that he drinks alcohol. He reports that he does not use drugs.   Family History:  The patient's family history includes Alzheimer's disease in his father; CVA in his father and paternal grandmother; Diabetes in his father; Hypertension in his brother, brother, mother, and sister.    ROS:  Please see the history of present illness.    Otherwise, review of systems positive for  Persistent spastic right hemiparesis.   All other systems are reviewed and are negative  PHYSICAL EXAM: VS:  BP 112/62   Pulse 70   Ht 5' 6.6" (1.692 m)   Wt 152 lb 3.2 oz (69  kg)   SpO2 97%   BMI 24.13 kg/m  , BMI Body mass index is 24.13 kg/m.  General: Alert, oriented x3, no distress, he is smiling and appears comfortable.  He is in a wheelchair. Head: no evidence of trauma, PERRL, EOMI, no exophtalmos or lid lag, no myxedema, no xanthelasma; normal ears, nose and oropharynx Neck: normal jugular venous pulsations and no hepatojugular reflux; brisk carotid pulses without delay and no carotid bruits Chest: clear to auscultation, no signs of consolidation by percussion or palpation, normal fremitus, symmetrical and full respiratory excursions Cardiovascular: normal position and quality of the apical impulse, regular rhythm, normal first and second heart sounds, no murmurs, rubs or gallops Abdomen: no tenderness or distention, no masses by palpation, no abnormal pulsatility or arterial bruits, normal bowel sounds, no hepatosplenomegaly Extremities: no clubbing, cyanosis or edema; 2+ radial, ulnar and brachial pulses bilaterally; 2+ right femoral, posterior tibial and dorsalis pedis pulses; 2+ left femoral, posterior tibial and dorsalis pedis pulses; no subclavian or femoral bruits Neurological:  Spastic left  hemiparesis Psych: euthymic mood, full affect   EKG:  EKG is not ordered today.  The tracing from November 1 shows sinus rhythm, left axis deviation, no repolarization abnormalities, unchanged from previous tracings.  Recent Labs: 05/14/2018: BUN 23; Creatinine, Ser 1.25; Hemoglobin 11.6; Platelets 261; Potassium 4.5; Sodium 134    Lipid Panel    Component Value Date/Time   CHOL 136 05/22/2016 1435   TRIG 73 05/22/2016 1435   HDL 44 05/22/2016 1435   CHOLHDL 3.1 05/22/2016 1435   VLDL 15 05/22/2016 1435   LDLCALC 77 05/22/2016 1435      Wt Readings from Last 3 Encounters:  05/21/18 152 lb 3.2 oz (69 kg)  05/14/18 159 lb 2.8 oz (72.2 kg)  04/28/18 159 lb (72.1 kg)      ASSESSMENT AND PLAN:  1. HTN: Very well controlled, no longer taking ARB.   He is on spironolactone, labetalol and nifedipine.  I am a little doubtful of the diagnosis of pheochromocytoma, but for safety, he should not be given unopposed beta-blockers.  Judging by how he has responded to high doses of spironolactone, I think is more likely he has a  aldosterone secreting adenoma. 2. PAD: Despite extensive PAD he does not have intermittent claudication, but this is likely because of his very sedentary lifestyle as a consequence of his hemiparesis and recent fracture. 3. CAD: He does not have angina pectoris despite the fact that he is only taking nifedipine as an antianginal.  He is on chronic clopidogrel therapy for his previous stroke. 4. HLP: On simvastatin, lipids followed at PCP office 5. L hemiparesis: Until his hip fracture he was well compensated and was able to travel independently, and he has had some setbacks with the additional problems following the surgery.  He will require physical therapy, probably for a longer than typical period.  We will repeat his carotid duplex    Labs/ tests ordered today include:   No orders of the defined types were placed in this encounter.    Patient Instructions  Medication Instructions:  Dr Sallyanne Kuster recommends that you continue on your current medications as directed. Please refer to the Current Medication list given to you today.  If you need a refill on your cardiac medications before your next appointment, please call your pharmacy.   Testing/Procedures: Carotid Duplex Ultrasound in December 2019 - Your physician has requested that you have a carotid duplex. This test is an ultrasound of the carotid arteries in your neck. It looks at blood flow through these arteries that supply the brain with blood. Allow one hour for this exam. There are no restrictions or special instructions.  Follow-Up: At [facility], you and your health needs are our priority.  As part of our continuing mission to provide you with exceptional  heart care, we have created designated Provider Care Teams.  These Care Teams include your primary Cardiologist (physician) and Advanced Practice Providers (APPs -  Physician Assistants and Nurse Practitioners) who all work together to provide you with the care you need, when you need it. You will need a follow up appointment in 6 months.  Please call our office 2 months in advance to schedule this appointment.  You may see Sanda Klein, MD or one of the following Advanced Practice Providers on your designated Care Team: [place], [place] . Fabian Sharp, PA-C     Signed, Sanda Klein, MD  05/21/2018 3:20 PM    Sanda Klein, MD, [facility] [phone] office [phone] pager

## 2018-05-28 ENCOUNTER — Telehealth: Payer: Self-pay | Admitting: Cardiovascular Disease

## 2018-05-28 ENCOUNTER — Other Ambulatory Visit: Payer: Self-pay | Admitting: Cardiovascular Disease

## 2018-05-28 DIAGNOSIS — I152 Hypertension secondary to endocrine disorders: Secondary | ICD-10-CM

## 2018-05-28 NOTE — Telephone Encounter (Signed)
Outpatient Medication Detail    Disp Refills Start End   labetalol (NORMODYNE) 300 MG tablet 180 tablet 0 05/28/2018    Sig - Route: TAKE 1 TABLET (300 MG TOTAL) BY MOUTH 2 (TWO) TIMES DAILY. - Oral   Sent to pharmacy as: labetalol (NORMODYNE) 300 MG tablet   E-Prescribing Status: Receipt confirmed by pharmacy (05/28/2018 11:22 AM EST)   Associated Diagnoses   Hypertension due to endocrine disorder     Pharmacy   [id number] - [place], [place] - [address]

## 2018-05-28 NOTE — Telephone Encounter (Signed)
Called patient to let him know that this was sent in earlier today. I didn't get an answer and his vm is full. [facility] and spoke with the pharmacist and they did receive the rx but it is not time for the patient to refill this medication per insurance. He should have enough medication to last about another month. The pharmacist stated that they sent the patient a msg. I attempted again to reach the patient but still didn't get an answer.

## 2018-05-28 NOTE — Telephone Encounter (Signed)
New message    *STAT* If patient is at the pharmacy, call can be transferred to refill team.   1. Which medications need to be refilled? (please list name of each medication and dose if known) labetalol (NORMODYNE) 300 MG tablet  2. Which pharmacy/location (including street and city if local pharmacy) is medication to be sent to?[id number] [place], [place] [address].  3. Do they need a 30 day or 90 day supply? [place]

## 2018-05-28 NOTE — Telephone Encounter (Signed)
Follow Up:   Patient calling because the pharmacy said he has no more refills.

## 2018-06-01 ENCOUNTER — Other Ambulatory Visit: Payer: Self-pay | Admitting: Cardiovascular Disease

## 2018-06-06 ENCOUNTER — Other Ambulatory Visit: Payer: Self-pay | Admitting: Cardiovascular Disease

## 2018-06-17 ENCOUNTER — Ambulatory Visit (INDEPENDENT_AMBULATORY_CARE_PROVIDER_SITE_OTHER): Payer: Medicare HMO | Admitting: Cardiovascular Disease

## 2018-06-17 ENCOUNTER — Encounter (HOSPITAL_COMMUNITY): Payer: Medicare HMO

## 2018-06-17 ENCOUNTER — Encounter: Payer: Self-pay | Admitting: Cardiovascular Disease

## 2018-06-17 ENCOUNTER — Ambulatory Visit: Payer: Medicare HMO | Admitting: Cardiovascular Disease

## 2018-06-17 VITALS — BP 109/60 | HR 68 | Ht 66.0 in | Wt 154.0 lb

## 2018-06-17 DIAGNOSIS — I152 Hypertension secondary to endocrine disorders: Secondary | ICD-10-CM

## 2018-06-17 DIAGNOSIS — I2510 Atherosclerotic heart disease of native coronary artery without angina pectoris: Secondary | ICD-10-CM

## 2018-06-17 DIAGNOSIS — I739 Peripheral vascular disease, unspecified: Secondary | ICD-10-CM

## 2018-06-17 DIAGNOSIS — G8114 Spastic hemiplegia affecting left nondominant side: Secondary | ICD-10-CM

## 2018-06-17 DIAGNOSIS — E7800 Pure hypercholesterolemia, unspecified: Secondary | ICD-10-CM | POA: Diagnosis not present

## 2018-06-17 NOTE — Progress Notes (Signed)
Patient ID: Howard Jackson, male   DOB: February 10, 1946, 72 y.o.   MRN: [id number]      Cardiology Office Note   Date:  06/20/2018   ID:  Howard Jackson, DOB 1945-08-14, MRN [id number]  PCP:  [facility] [place]  Cardiologist:   Sanda Klein, MD   Chief Complaint  Patient presents with  . Hypertension  . Coronary Artery Disease  . PAD  Recent hospitalization    History of Present Illness: Howard Jackson is a 72 y.o. male who presents for follow-up for severe systemic hypertension.  He continues to complain of a lot of problems with his left leg, particular left hip pain.  He has started physical therapy.  The patient specifically denies any chest pain at rest exertion, dyspnea at rest or with exertion, orthopnea, paroxysmal nocturnal dyspnea, syncope, palpitations, focal neurological deficits, intermittent claudication, lower extremity edema, unexplained weight gain, cough, hemoptysis or wheezing.  He bears a diagnosis of pheochromocytoma initially identified in 1989. 2010 abdominal MRI shows a 6 mm right adrenal mass consistent with pheochromocytoma.  I wonder if he truly has an adrenal adenoma causing hyperaldosteronism.  He has a history of extensive PAD and CAD:  - subtotal right popliteal occlusion with weak monophasic PT and Per flow and occluded AT. On the left all three calf vessels are occluded, with collateral reconstitution of the PT and Per. Right ABI 0.73, left 0.77 (Doppler Sept 2014). There was no significant pelvic or femoral disease.  - He had moderate left renal artery stenosis (50-60% angio 2011, <60% Duplex 2014), but his most recent duplex ultrasound from 2015 did not show evidence of renal artery stenosis on either side..  - Carotid US 11/2013 at [facility] showed no significant lesions. Residual right spastic hemiparesis after a remote left hemispheric stroke. -  He has had 5 coronary angioplasty/stent  procedures to the RCA and LAD, most recently  in April 2011 for LAD in-stent restenosis (when he received another drug-eluting stent)  In the past he had very prominent symptoms of orthostatic hypotension while taking clonidine. He was intolerant to treatment with PDE 5 inhibitors for erectile dysfunction due to nasal congestion and dyspepsia.   Past Medical History:  Diagnosis Date  . Adrenal gland disorder ([place])    tumor present - no change-  no recent increase    . Arthritis   . Benign pheochromocytoma of right adrenal gland 06/23/2014   Never had histological diagnosis, but reportedly initially diagnosed in 1989  . Bunion    left foot  . Complication of anesthesia    "died on the table during cervical fusion" '89 - believed to be d/t "too much medication" to treat HTN and was later dx with pheochromocytoma  . Coronary artery disease    cleared for surg. by Dr. Gwenlyn Found - [facility]  . H/O hiatal hernia   . Hammer toe   . Herniated lumbar intervertebral disc   . History of anxiety   . Hyperlipidemia   . Hypertension   . Neurogenic bladder   . Neuromuscular disorder ([place])    carpal tunnel problem, even after surgery release   . Peripheral arterial disease ([place])   . Primary aldosteronism ([place])   . Stroke ([place])    in [place], L sided weakness, wears a brace on L leg & uses cane    Past Surgical History:  Procedure Laterality Date  . ANTERIOR APPROACH HEMI HIP ARTHROPLASTY Left 04/15/2018   Procedure: ANTERIOR APPROACH HEMI  HIP ARTHROPLASTY;  Surgeon: Rod Can, MD;  Location: [place];  Service: Orthopedics;  Laterality: Left;  . CARPAL TUNNEL RELEASE     bilateral   . CERVICAL FUSION    . CORONARY ANGIOPLASTY WITH STENT PLACEMENT     pt reports having 5 stents in his heart-2011  . HERNIA REPAIR    . INGUINAL HERNIA REPAIR Left 11/18/2012   Procedure: HERNIA REPAIR INGUINAL ADULT;  Surgeon: Harl Bowie, MD;  Location: [place];  Service: General;  Laterality: Left;  . INSERTION OF MESH Left 11/18/2012   Procedure:  INSERTION OF MESH;  Surgeon: Harl Bowie, MD;  Location: [facility];  Service: General;  Laterality: Left;     Current Outpatient Medications  Medication Sig Dispense Refill  . aspirin 81 MG chewable tablet Chew 1 tablet (81 mg total) by mouth daily. 60 tablet 1  . clopidogrel (PLAVIX) 75 MG tablet Take 1 tablet (75 mg total) by mouth daily. 90 tablet 3  . clopidogrel (PLAVIX) 75 MG tablet TOME UNA TABLETA TODOS LOS DIAS 90 tablet 3  . finasteride (PROSCAR) 5 MG tablet Take 5 mg by mouth daily.    Marland Kitchen FLOVENT HFA 110 MCG/ACT inhaler Inhale 1 puff into the lungs 3 (three) times daily.   2  . Glucosamine-Chondroit-Vit C-Mn (FLEX-DS PO) Take by mouth 2 (two) times daily.    Marland Kitchen HYDROcodone-acetaminophen (NORCO/VICODIN) 5-325 MG tablet Take 1 tablet by mouth 2 (two) times daily as needed for pain.  0  . labetalol (NORMODYNE) 300 MG tablet TAKE 1 TABLET (300 MG TOTAL) BY MOUTH 2 (TWO) TIMES DAILY. 180 tablet 0  . lactobacillus acidophilus (BACID) TABS tablet Take 2 tablets by mouth 2 (two) times daily.    . meloxicam (MOBIC) 7.5 MG tablet Take 1 tablet (7.5 mg total) by mouth as needed for pain. 20 tablet 0  . NIFEdipine (ADALAT CC) 30 MG 24 hr tablet TAKE 1 TABLET BY MOUTH EVERY EVENING (USE OTHER RX 60MG  IN MORNING ONLY) 90 tablet 2  . NIFEdipine (ADALAT CC) 60 MG 24 hr tablet TAKE 1 TABLET BY MOUTH EVERY MORNING (USE OTHER RX 30MG  IN EVENING ONLY) 90 tablet 1  . OVER THE COUNTER MEDICATION graminex-1 capsule daily    . OVER THE COUNTER MEDICATION 2 (two) times daily. Ultimate pro support    . senna-docusate (SENOKOT-S) 8.6-50 MG tablet Take 3 tablets by mouth every evening.    . simvastatin (ZOCOR) 40 MG tablet Take 1 tablet (40 mg total) by mouth every evening. 90 tablet 2  . spironolactone (ALDACTONE) 100 MG tablet Take 1 tablet (100 mg total) by mouth daily.    . tamsulosin (FLOMAX) 0.4 MG CAPS capsule Take 1 capsule (0.4 mg total) by mouth daily. (Patient taking differently: Take 0.4 mg by  mouth 2 (two) times daily. ) 30 capsule 3  . tiZANidine (ZANAFLEX) 4 MG tablet TAKE 1-2 TABLETS (4-8 MG TOTAL) BY MOUTH AT BEDTIME AS NEEDED. FOR MUSCLE SPASMS (Patient taking differently: Take 4-8 mg by mouth at bedtime as needed for muscle spasms. ) 90 tablet 0   No current facility-administered medications for this visit.     Allergies:   Elavil [amitriptyline hcl] and Sulfa antibiotics    Social History:  The patient  reports that he has never smoked. He has never used smokeless tobacco. He reports that he drinks alcohol. He reports that he does not use drugs.   Family History:  The patient's family history includes Alzheimer's disease in his father;  CVA in his father and paternal grandmother; Diabetes in his father; Hypertension in his brother, brother, mother, and sister.    ROS:  Please see the history of present illness.    Otherwise, review of systems positive for  Persistent spastic right hemiparesis.   All other symptoms are reviewed and are negative  PHYSICAL EXAM: VS:  BP 109/60   Pulse 68   Ht 5\' 6"  (1.676 m)   Wt 154 lb (69.9 kg)   SpO2 99%   BMI 24.86 kg/m  , BMI Body mass index is 24.86 kg/m.   General: Alert, oriented x3, no distress, in a wheelchair Head: no evidence of trauma, PERRL, EOMI, no exophtalmos or lid lag, no myxedema, no xanthelasma; normal ears, nose and oropharynx Neck: normal jugular venous pulsations and no hepatojugular reflux; brisk carotid pulses without delay and no carotid bruits Chest: clear to auscultation, no signs of consolidation by percussion or palpation, normal fremitus, symmetrical and full respiratory excursions Cardiovascular: normal position and quality of the apical impulse, regular rhythm, normal first and second heart sounds, no murmurs, rubs or gallops Abdomen: no tenderness or distention, no masses by palpation, no abnormal pulsatility or arterial bruits, normal bowel sounds, no hepatosplenomegaly Extremities: no clubbing,  cyanosis or edema; 2+ radial, ulnar and brachial pulses bilaterally; 1+ right femoral, posterior tibial and dorsalis pedis pulses; 1+ left femoral, posterior tibial and dorsalis pedis pulses; no subclavian or femoral bruits Neurological: Left suspect stick hemiparesis Psych: Normal mood and affect   EKG:  EKG is not ordered today.  The tracing from November 1 shows sinus rhythm, left axis deviation, no repolarization abnormalities, unchanged from previous tracings.  Recent Labs: 05/14/2018: BUN 23; Creatinine, Ser 1.25; Hemoglobin 11.6; Platelets 261; Potassium 4.5; Sodium 134    Lipid Panel    Component Value Date/Time   CHOL 136 05/22/2016 1435   TRIG 73 05/22/2016 1435   HDL 44 05/22/2016 1435   CHOLHDL 3.1 05/22/2016 1435   VLDL 15 05/22/2016 1435   LDLCALC 77 05/22/2016 1435      Wt Readings from Last 3 Encounters:  06/17/18 154 lb (69.9 kg)  05/21/18 152 lb 3.2 oz (69 kg)  05/14/18 159 lb 2.8 oz (72.2 kg)      ASSESSMENT AND PLAN:  1. HTN: Remains well controlled despite the losartan. I am a little doubtful of the diagnosis of pheochromocytoma, but for safety, he should not be given unopposed beta-blockers.  Judging by how he has responded to high doses of spironolactone, I think is more likely he has a aldosterone secreting adenoma. 2. PAD: Does not have claudication, probably in large part because of his inactivity.  Due to have repeat carotid duplex study. 3. CAD: On chronic clopidogrel (for history of stroke) angina free.  Taking simvastatin. 4. HLP: PCP follows lipids.  Most recent lipid profile have shows an LDL just above 70. 5. L hemiparesis: Until his hip fracture he was well compensated and was able to travel independently, and he has had some setbacks with the additional problems following the surgery.  He is scheduled for repeat carotid duplex study on December 10.  We will try to perform that today to avoid the complications related to his  transportation.   Labs/ tests ordered today include:   No orders of the defined types were placed in this encounter.    Patient Instructions  Medication Instructions:  Dr Sallyanne Kuster recommends that you continue on your current medications as directed. Please refer to the Current Medication  list given to you today.  If you need a refill on your cardiac medications before your next appointment, please call your pharmacy.   Testing/Procedures: Come back today at 1:45p for your carotid doppler.  Follow-Up: Dr Sallyanne Kuster recommends that you schedule a follow-up appointment in 3 months.     Signed, Sanda Klein, MD  06/20/2018 11:57 AM    Sanda Klein, MD, [facility] [phone] office [phone] pager

## 2018-06-17 NOTE — Patient Instructions (Signed)
Medication Instructions:  Dr Sallyanne Kuster recommends that you continue on your current medications as directed. Please refer to the Current Medication list given to you today.  If you need a refill on your cardiac medications before your next appointment, please call your pharmacy.   Testing/Procedures: Come back today at 1:45p for your carotid doppler.  Follow-Up: Dr Sallyanne Kuster recommends that you schedule a follow-up appointment in 3 months.

## 2018-06-20 ENCOUNTER — Encounter: Payer: Self-pay | Admitting: Cardiovascular Disease

## 2018-06-20 DIAGNOSIS — E7800 Pure hypercholesterolemia, unspecified: Secondary | ICD-10-CM | POA: Insufficient documentation

## 2018-06-20 DIAGNOSIS — E785 Hyperlipidemia, unspecified: Secondary | ICD-10-CM | POA: Insufficient documentation

## 2018-06-20 DIAGNOSIS — G8114 Spastic hemiplegia affecting left nondominant side: Secondary | ICD-10-CM | POA: Insufficient documentation

## 2018-06-22 ENCOUNTER — Inpatient Hospital Stay (HOSPITAL_COMMUNITY): Admission: RE | Admit: 2018-06-22 | Payer: Medicare HMO | Source: Ambulatory Visit

## 2018-07-22 ENCOUNTER — Other Ambulatory Visit: Payer: Self-pay | Admitting: Internal Medicine

## 2018-07-22 DIAGNOSIS — Z8781 Personal history of (healed) traumatic fracture: Secondary | ICD-10-CM

## 2018-08-04 ENCOUNTER — Telehealth: Payer: Self-pay

## 2018-08-04 ENCOUNTER — Ambulatory Visit (HOSPITAL_COMMUNITY)
Admission: RE | Admit: 2018-08-04 | Discharge: 2018-08-04 | Disposition: A | Discharge status: Home / Self Care | Payer: Medicare HMO | Source: Ambulatory Visit | Attending: Cardiology | Admitting: Cardiology

## 2018-08-04 DIAGNOSIS — I6529 Occlusion and stenosis of unspecified carotid artery: Secondary | ICD-10-CM | POA: Diagnosis present

## 2018-08-04 DIAGNOSIS — G8114 Spastic hemiplegia affecting left nondominant side: Secondary | ICD-10-CM | POA: Insufficient documentation

## 2018-08-04 DIAGNOSIS — I152 Hypertension secondary to endocrine disorders: Secondary | ICD-10-CM

## 2018-08-04 MED ORDER — NIFEDIPINE ER 30 MG PO TB24: 30.0000 mg | ORAL_TABLET | Freq: Every morning | ORAL | 3 refills | Status: DC

## 2018-08-04 MED ORDER — NIFEDIPINE ER 30 MG PO TB24: ORAL_TABLET | ORAL | 3 refills | Status: DC

## 2018-08-04 MED ORDER — LABETALOL HCL 300 MG PO TABS: 300.0000 mg | ORAL_TABLET | Freq: Two times a day (BID) | ORAL | 3 refills | Status: DC

## 2018-08-04 MED ORDER — NIFEDIPINE ER 60 MG PO TB24: ORAL_TABLET | ORAL | 3 refills | Status: DC

## 2018-08-04 MED ORDER — NIFEDIPINE ER 60 MG PO TB24: 60.0000 mg | ORAL_TABLET | Freq: Every evening | ORAL | 3 refills | Status: DC

## 2018-08-04 NOTE — Telephone Encounter (Signed)
The fall would not affect his stent. Volatile BP has always been a problem for him. OK for refills MCr

## 2018-08-04 NOTE — Telephone Encounter (Signed)
Spoke with patient.  Patient reports that he has been taking his nifedipine 60 mg twice daily due to his current issues (see note below).  Discussed with Dr Sallyanne Kuster - he recommended switching patient to 30 mg QAM and 60 mg QPM.  Returned call to patient. Patient verbalized understanding and agreed with plan. Updated prescriptions sent to patient's preferred pharmacy electronically.

## 2018-08-04 NOTE — Telephone Encounter (Signed)
Please also tell him his carotid scan was normal

## 2018-08-31 ENCOUNTER — Other Ambulatory Visit: Payer: Self-pay | Admitting: Internal Medicine

## 2018-08-31 DIAGNOSIS — M899 Disorder of bone, unspecified: Secondary | ICD-10-CM

## 2018-08-31 DIAGNOSIS — Z8781 Personal history of (healed) traumatic fracture: Secondary | ICD-10-CM

## 2018-09-14 ENCOUNTER — Other Ambulatory Visit: Payer: Medicare HMO

## 2018-09-20 ENCOUNTER — Ambulatory Visit
Admission: RE | Admit: 2018-09-20 | Discharge: 2018-09-20 | Disposition: A | Discharge status: Home / Self Care | Payer: Medicare HMO | Source: Ambulatory Visit | Attending: Internal Medicine | Admitting: Internal Medicine

## 2018-09-20 DIAGNOSIS — M899 Disorder of bone, unspecified: Secondary | ICD-10-CM

## 2018-09-20 DIAGNOSIS — Z8781 Personal history of (healed) traumatic fracture: Secondary | ICD-10-CM

## 2018-10-23 ENCOUNTER — Other Ambulatory Visit: Payer: Self-pay | Admitting: Cardiovascular Disease

## 2018-11-04 ENCOUNTER — Telehealth: Payer: Self-pay

## 2018-11-04 NOTE — Telephone Encounter (Signed)
Virtual Visit Pre-Appointment Phone Call  "Howard Jackson, I am calling you today to discuss your upcoming appointment. We are currently trying to limit exposure to the virus that causes COVID-19 by seeing patients at home rather than in the office."  1. "What is the BEST phone number to call the day of the visit?" - include this in appointment notes  2. "Do you have or have access to (through a family member/friend) a smartphone with video capability that we can use for your visit?" a. If yes - list this number in appt notes as "cell" (if different from BEST phone #) and list the appointment type as a VIDEO visit in appointment notes b. If no - list the appointment type as a PHONE visit in appointment notes  3. Confirm consent - "In the setting of the current Covid19 crisis, you are scheduled for a PHONE visit with your provider on 11/09/2018 at 2:00PM.  Just as we do with many in-office visits, in order for you to participate in this visit, we must obtain consent.  If you'd like, I can send this to your mychart (if signed up) or email for you to review.  Otherwise, I can obtain your verbal consent now.  All virtual visits are billed to your insurance company just like a normal visit would be.  By agreeing to a virtual visit, we'd like you to understand that the technology does not allow for your provider to perform an examination, and thus may limit your provider's ability to fully assess your condition. If your provider identifies any concerns that need to be evaluated in person, we will make arrangements to do so.  Finally, though the technology is pretty good, we cannot assure that it will always work on either your or our end, and in the setting of a video visit, we may have to convert it to a phone-only visit.  In either situation, we cannot ensure that we have a secure connection.  Are you willing to proceed?" STAFF: Did the patient verbally acknowledge consent to telehealth visit? Document YES/NO  here: YES  4. Advise patient to be prepared - "Two hours prior to your appointment, go ahead and check your blood pressure, pulse, oxygen saturation, and your weight (if you have the equipment to check those) and write them all down. When your visit starts, your provider will ask you for this information. If you have an Apple Watch or Kardia device, please plan to have heart rate information ready on the day of your appointment. Please have a pen and paper handy nearby the day of the visit as well."  5. Give patient instructions for MyChart download to smartphone OR [email or web address] as below if video visit (depending on what platform provider is using)  6. Inform patient they will receive a phone call 15 minutes prior to their appointment time (may be from unknown caller ID) so they should be prepared to answer    TELEPHONE CALL NOTE  Howard Jackson has been deemed a candidate for a follow-up tele-health visit to limit community exposure during the Covid-19 pandemic. I spoke with the patient via phone to ensure availability of phone/video source, confirm preferred email & phone number, and discuss instructions and expectations.  I reminded Howard Jackson to be prepared with any vital sign and/or heart rhythm information that could potentially be obtained via home monitoring, at the time of his visit. I reminded Howard Jackson to expect a phone call prior to his  visit.  Howard Jackson, CMA 11/04/2018 1:42 PM   INSTRUCTIONS FOR DOWNLOADING THE MYCHART APP TO SMARTPHONE  - The patient must first make sure to have activated MyChart and know their login information - If Apple, go to [employer] and type in MyChart in the search bar and download the app. If Android, ask patient to go to [employer] and type in [place] in the search bar and download the app. The app is free but as with any other app downloads, their phone may require them to verify saved payment information or  Apple/Android password.  - The patient will need to then log into the app with their MyChart username and password, and select [facility] as their healthcare provider to link the account. When it is time for your visit, go to the MyChart app, find appointments, and click Begin Video Visit. Be sure to Select Allow for your device to access the Microphone and Camera for your visit. You will then be connected, and your provider will be with you shortly.  **If they have any issues connecting, or need assistance please contact MyChart service desk [phone])**  **If using a computer, in order to ensure the best quality for their visit they will need to use either of the following Internet Browsers: [employer], or Google Chrome**  IF USING DOXIMITY or DOXY.ME - The patient will receive a link just prior to their visit by text.     FULL LENGTH CONSENT FOR TELE-HEALTH VISIT   I hereby voluntarily request, consent and authorize [facility] and its employed or contracted physicians, physician assistants, nurse practitioners or other licensed health care professionals (the Practitioner), to provide me with telemedicine health care services (the "Services") as deemed necessary by the treating Practitioner. I acknowledge and consent to receive the Services by the Practitioner via telemedicine. I understand that the telemedicine visit will involve communicating with the Practitioner through live audiovisual communication technology and the disclosure of certain medical information by electronic transmission. I acknowledge that I have been given the opportunity to request an in-person assessment or other available alternative prior to the telemedicine visit and am voluntarily participating in the telemedicine visit.  I understand that I have the right to withhold or withdraw my consent to the use of telemedicine in the course of my care at any time, without affecting my right to future care  or treatment, and that the Practitioner or I may terminate the telemedicine visit at any time. I understand that I have the right to inspect all information obtained and/or recorded in the course of the telemedicine visit and may receive copies of available information for a reasonable fee.  I understand that some of the potential risks of receiving the Services via telemedicine include:  Marland Kitchen Delay or interruption in medical evaluation due to technological equipment failure or disruption; . Information transmitted may not be sufficient (e.g. poor resolution of images) to allow for appropriate medical decision making by the Practitioner; and/or  . In rare instances, security protocols could fail, causing a breach of personal health information.  Furthermore, I acknowledge that it is my responsibility to provide information about my medical history, conditions and care that is complete and accurate to the best of my ability. I acknowledge that Practitioner's advice, recommendations, and/or decision may be based on factors not within their control, such as incomplete or inaccurate data provided by me or distortions of diagnostic images or specimens that may result from electronic transmissions. I  understand that the practice of medicine is not an exact science and that Practitioner makes no warranties or guarantees regarding treatment outcomes. I acknowledge that I will receive a copy of this consent concurrently upon execution via email to the email address I last provided but may also request a printed copy by calling the office of [facility].    I understand that my insurance will be billed for this visit.   I have read or had this consent read to me. . I understand the contents of this consent, which adequately explains the benefits and risks of the Services being provided via telemedicine.  . I have been provided ample opportunity to ask questions regarding this consent and the Services and have had  my questions answered to my satisfaction. . I give my informed consent for the services to be provided through the use of telemedicine in my medical care  By participating in this telemedicine visit I agree to the above.

## 2018-11-08 ENCOUNTER — Telehealth: Payer: Self-pay | Admitting: Cardiovascular Disease

## 2018-11-09 ENCOUNTER — Telehealth (INDEPENDENT_AMBULATORY_CARE_PROVIDER_SITE_OTHER): Payer: Medicare HMO | Admitting: Cardiovascular Disease

## 2018-11-09 VITALS — BP 128/77 | HR 59 | Ht 67.0 in

## 2018-11-09 DIAGNOSIS — I2510 Atherosclerotic heart disease of native coronary artery without angina pectoris: Secondary | ICD-10-CM

## 2018-11-09 DIAGNOSIS — E7800 Pure hypercholesterolemia, unspecified: Secondary | ICD-10-CM | POA: Diagnosis not present

## 2018-11-09 DIAGNOSIS — G8114 Spastic hemiplegia affecting left nondominant side: Secondary | ICD-10-CM

## 2018-11-09 DIAGNOSIS — I152 Hypertension secondary to endocrine disorders: Secondary | ICD-10-CM | POA: Diagnosis not present

## 2018-11-09 DIAGNOSIS — I6529 Occlusion and stenosis of unspecified carotid artery: Secondary | ICD-10-CM

## 2018-11-09 DIAGNOSIS — I739 Peripheral vascular disease, unspecified: Secondary | ICD-10-CM

## 2018-11-09 DIAGNOSIS — I701 Atherosclerosis of renal artery: Secondary | ICD-10-CM

## 2018-11-09 NOTE — Telephone Encounter (Signed)
Message sent to provider 

## 2018-11-09 NOTE — Progress Notes (Signed)
Virtual Visit via Video Note   This visit type was conducted due to national recommendations for restrictions regarding the COVID-19 Pandemic (e.g. social distancing) in an effort to limit this patient's exposure and mitigate transmission in our community.  Due to his co-morbid illnesses, this patient is at least at moderate risk for complications without adequate follow up.  This format is felt to be most appropriate for this patient at this time.  All issues noted in this document were discussed and addressed.  A limited physical exam was performed with this format.  Please refer to the patient's chart for his consent to telehealth for [facility].   Evaluation Performed:  Follow-up visit  Date:  11/09/2018   ID:  Howard Jackson, DOB 1945/08/12, MRN [id number]  Patient Location: Home Provider Location: Home  PCP:  Kristen Loader, FNP  Cardiologist:  Sanda Klein, MD  Electrophysiologist:  None   Chief Complaint: Fatigue, intermittent blood pressure problems  History of Present Illness:    Howard Jackson is a 73 y.o. male with CAD, PAD and hypertension related to an adrenal tumor.  Overall he is done well and denies problems with angina at rest or with activity, dyspnea at rest or with activity, new focal neurological complaints, patient's, syncope.  He continues to have relatively volatile blood pressure and recently has had some episodes of hypotension symptoms of dizziness and weakness.  This usually occurs in the evenings.  He has had systolic blood pressure as low as 89 mmHg.  He also complains of intermittent problems with ankle edema.  He has an acceptable lipid profile last October with an LDL of 92, although this is not at target (less than 70).  HDL was 46, total cholesterol 153, triglycerides 72.  I am not sure why his LDL has worsened, since on the same medication it was at target the couple of years ago.  He reports being less physically active.  Labs  performed December 2019 showed a creatinine of 1.05, hemoglobin 14.5, potassium 4.4  He bears a diagnosis of pheochromocytoma initially identified in 1989. 2010 abdominal MRI shows a 6 mm right adrenal mass consistent with pheochromocytoma.  I wonder if he truly has an adrenal adenoma causing hyperaldosteronism.  He has a history of extensive PAD and CAD:  - subtotal right popliteal occlusion with weak monophasic PT and Per flow and occluded AT. On the left all three calf vessels are occluded, with collateral reconstitution of the PT and Per. Right ABI 0.73, left 0.77 (Doppler Sept 2014). There was no significant pelvic or femoral disease.  - He had moderate left renal artery stenosis (50-60% angio 2011, <60% Duplex 2014), but his most recent duplex ultrasound from 2015 did not show evidence of renal artery stenosis on either side..  - Carotid US 11/2013 at [facility] showed no significant lesions. Residual right spastic hemiparesis after a remote left hemispheric stroke. -  He has had 5 coronary angioplasty/stent  procedures to the RCA and LAD, most recently in April 2011 for LAD in-stent restenosis (when he received another drug-eluting stent)  In the past he had very prominent symptoms of orthostatic hypotension while taking clonidine. He was intolerant to treatment with PDE 5 inhibitors for erectile dysfunction due to nasal congestion and dyspepsia.  The patient does not have symptoms concerning for COVID-19 infection (fever, chills, cough, or new shortness of breath).    Past Medical History:  Diagnosis Date  . Adrenal gland disorder ([place])    tumor  present - no change-  no recent increase    . Arthritis   . Benign pheochromocytoma of right adrenal gland 06/23/2014   Never had histological diagnosis, but reportedly initially diagnosed in 1989  . Bunion    left foot  . Complication of anesthesia    "died on the table during cervical fusion" '89 - believed to be d/t "too much medication"  to treat HTN and was later dx with pheochromocytoma  . Coronary artery disease    cleared for surg. by Dr. Gwenlyn Found - [facility]  . H/O hiatal hernia   . Hammer toe   . Herniated lumbar intervertebral disc   . History of anxiety   . Hyperlipidemia   . Hypertension   . Neurogenic bladder   . Neuromuscular disorder ([place])    carpal tunnel problem, even after surgery release   . Peripheral arterial disease ([place])   . Primary aldosteronism ([place])   . Stroke ([place])    in [place], L sided weakness, wears a brace on L leg & uses cane   Past Surgical History:  Procedure Laterality Date  . ANTERIOR APPROACH HEMI HIP ARTHROPLASTY Left 04/15/2018   Procedure: ANTERIOR APPROACH HEMI HIP ARTHROPLASTY;  Surgeon: Rod Can, MD;  Location: [place];  Service: Orthopedics;  Laterality: Left;  . CARPAL TUNNEL RELEASE     bilateral   . CERVICAL FUSION    . CORONARY ANGIOPLASTY WITH STENT PLACEMENT     pt reports having 5 stents in his heart-2011  . HERNIA REPAIR    . INGUINAL HERNIA REPAIR Left 11/18/2012   Procedure: HERNIA REPAIR INGUINAL ADULT;  Surgeon: Harl Bowie, MD;  Location: [place];  Service: General;  Laterality: Left;  . INSERTION OF MESH Left 11/18/2012   Procedure: INSERTION OF MESH;  Surgeon: Harl Bowie, MD;  Location: [place];  Service: General;  Laterality: Left;     Current Meds  Medication Sig  . clopidogrel (PLAVIX) 75 MG tablet Take 1 tablet (75 mg total) by mouth daily.  . finasteride (PROSCAR) 5 MG tablet Take 5 mg by mouth daily.  Marland Kitchen FLOVENT HFA 110 MCG/ACT inhaler Inhale 1 puff into the lungs 3 (three) times daily.   . Glucosamine-Chondroit-Vit C-Mn (FLEX-DS PO) Take by mouth 2 (two) times daily.  Marland Kitchen labetalol (NORMODYNE) 300 MG tablet Take 1 tablet (300 mg total) by mouth 2 (two) times daily.  Marland Kitchen lactobacillus acidophilus (BACID) TABS tablet Take 2 tablets by mouth 2 (two) times daily.  Marland Kitchen NIFEdipine (ADALAT CC) 60 MG 24 hr tablet Take 1 tablet (60 mg total) by mouth every  evening. (use other 30 mg Rx in the [place])  . OVER THE COUNTER MEDICATION graminex-1 capsule daily  . OVER THE COUNTER MEDICATION 2 (two) times daily. Ultimate pro support  . senna-docusate (SENOKOT-S) 8.6-50 MG tablet Take 3 tablets by mouth every evening.  . simvastatin (ZOCOR) 40 MG tablet Take 1 tablet (40 mg total) by mouth every evening.  Marland Kitchen spironolactone (ALDACTONE) 100 MG tablet Take 1 tablet (100 mg total) by mouth daily.  . tamsulosin (FLOMAX) 0.4 MG CAPS capsule Take 1 capsule (0.4 mg total) by mouth daily. (Patient taking differently: Take 0.4 mg by mouth 2 (two) times daily. )  . [DISCONTINUED] NIFEdipine (ADALAT CC) 30 MG 24 hr tablet TAKE 1 TABLET BY MOUTH EVERY EVENING (USE OTHER RX 60MG  IN MORNING ONLY)     Allergies:   Elavil [amitriptyline hcl] and Sulfa antibiotics   Social History  Tobacco Use  . Smoking status: Never Smoker  . Smokeless tobacco: Never Used  Substance Use Topics  . Alcohol use: Yes    Comment: social - one drink, on special occasion   . Drug use: No     Family Hx: The patient's family history includes Alzheimer's disease in his father; CVA in his father and paternal grandmother; Diabetes in his father; Hypertension in his brother, brother, mother, and sister.  ROS:   Please see the history of present illness.    All other systems reviewed and are negative.   Prior CV studies:   The following studies were reviewed today: Labs from outside source Carotid duplex ultrasound August 04, 2018  Labs/Other Tests and Data Reviewed:    EKG:  An ECG dated 05/15/2018 was personally reviewed today and demonstrated:  Normal sinus rhythm, left axis deviation not quite meeting criteria for left anterior fascicular block  Recent Labs: 05/14/2018: BUN 23; Creatinine, Ser 1.25; Hemoglobin 11.6; Platelets 261; Potassium 4.5; Sodium 134   Recent Lipid Panel Lab Results  Component Value Date/Time   CHOL 136 05/22/2016 02:35 PM   TRIG 73 05/22/2016  02:35 PM   HDL 44 05/22/2016 02:35 PM   CHOLHDL 3.1 05/22/2016 02:35 PM   LDLCALC 77 05/22/2016 02:35 PM    Wt Readings from Last 3 Encounters:  06/17/18 154 lb (69.9 kg)  05/21/18 152 lb 3.2 oz (69 kg)  05/14/18 159 lb 2.8 oz (72.2 kg)     Objective:    Vital Signs:  BP 128/77   Pulse (!) 59   Ht 5\' 7"  (1.702 m)   BMI 24.12 kg/m    VITAL SIGNS:  reviewed Unable to examine  ASSESSMENT & PLAN:    1. CAD: Denies angina pectoris. 2. PAD: Also denies intermittent claudication.  He is relatively sedentary and both problems may be masked by this. 3. HTN: Based on past history and response to medication he probably has primary hyperaldosteronism rather than pheochromocytoma.  He is now having some episodes of low blood pressure as well as some ankle swelling.  I asked him to reduce his nifedipine XL (he will only take the 60 mg daily evening dose and no longer take the 30 mg in the morning).  I would hope this would help both with episodes of hypotension and reduce his ankle swelling. 4. HLP: LDL cholesterol is not as well-controlled as it was in the past.  He reports compliance with simvastatin.  He is due for repeat evaluation if his LDL is still not as target would recommend replacing this with a more potent statin such as rosuvastatin 20 mg daily or atorvastatin 80 mg daily.  He usually gets his lipids checked with his primary care provider at [facility]. 5. L hemiparesis: Sequela of old ischemic strokes, on lifelong clopidogrel.  Carotid duplex ultrasound performed January 2020 showed no evidence of significant obstruction in either carotid artery.  I do not think he needs routine follow-up carotid studies, barring new symptoms.  COVID-19 Education: The signs and symptoms of COVID-19 were discussed with the patient and how to seek care for testing (follow up with PCP or arrange E-visit).  The importance of social distancing was discussed today.  Time:   Today, I have spent 21 minutes with  the patient with telehealth technology discussing the above problems.     Medication Adjustments/Labs and Tests Ordered: Current medicines are reviewed at length with the patient today.  Concerns regarding medicines are outlined above.  Tests Ordered: Orders Placed This Encounter  Procedures  . Basic metabolic panel    Medication Changes: No orders of the defined types were placed in this encounter.   Disposition:  Follow up 6 months  Signed, Sanda Klein, MD  11/09/2018 4:20 PM    [facility]

## 2018-11-09 NOTE — Patient Instructions (Signed)
Medication Instructions:  Stop morning dose of Nifedipine. Keep taking Nifedipine 60 mg every evening Continue all other medications  If you need a refill on your cardiac medications before your next appointment, please call your pharmacy.   Lab work: Lab Paramedic ) to be done in mid June You can have done at our office lab or at [place]. Need to have done every 6 months.     If you have done at our office lab no appointment is needed.Lab opens 8:00 am to 12:00 noon or 2:00 pm to 4:00 pm.   Testing/Procedures: None ordered  Follow-Up: At [facility], you and your health needs are our priority.  As part of our continuing mission to provide you with exceptional heart care, we have created designated Provider Care Teams.  These Care Teams include your primary Cardiologist (physician) and Advanced Practice Providers (APPs -  Physician Assistants and Nurse Practitioners) who all work together to provide you with the care you need, when you need it. . Schedule follow up appointment with Dr.Croitoru in 6 months    Call 3 months before to schedule.

## 2018-11-24 DIAGNOSIS — I69354 Hemiplegia and hemiparesis following cerebral infarction affecting left non-dominant side: Secondary | ICD-10-CM | POA: Diagnosis not present

## 2018-11-24 DIAGNOSIS — M6281 Muscle weakness (generalized): Secondary | ICD-10-CM | POA: Diagnosis not present

## 2018-11-24 DIAGNOSIS — S72042D Displaced fracture of base of neck of left femur, subsequent encounter for closed fracture with routine healing: Secondary | ICD-10-CM | POA: Diagnosis not present

## 2018-12-05 IMAGING — US US ABDOMEN COMPLETE
1 series · 13 of 25 positions shown · non-contrast
Comparison: MRI of the abdomen dated November 03, 2008

CLINICAL DATA: Left upper quadrant abdominal pain. History of
hyperlipidemia.

EXAM:
ABDOMEN ULTRASOUND COMPLETE

[Series 1: us abdomen complete · 0.22mm/px · 13 of 104 slices shown]
[im 1/104]
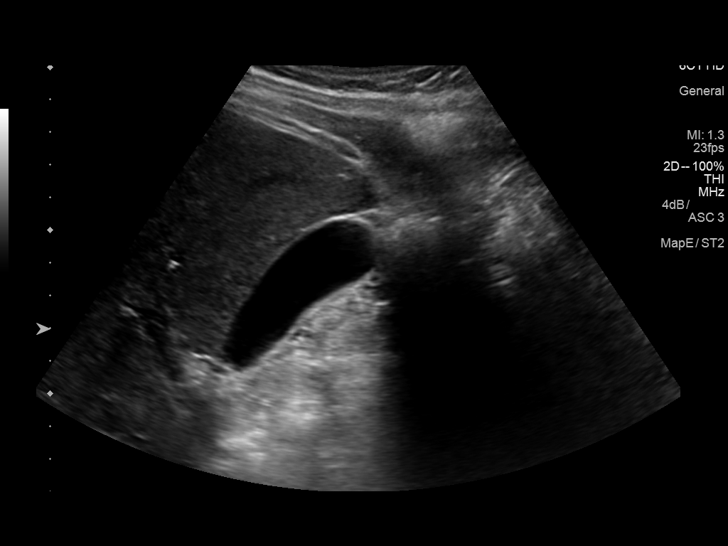
[im 9/104]
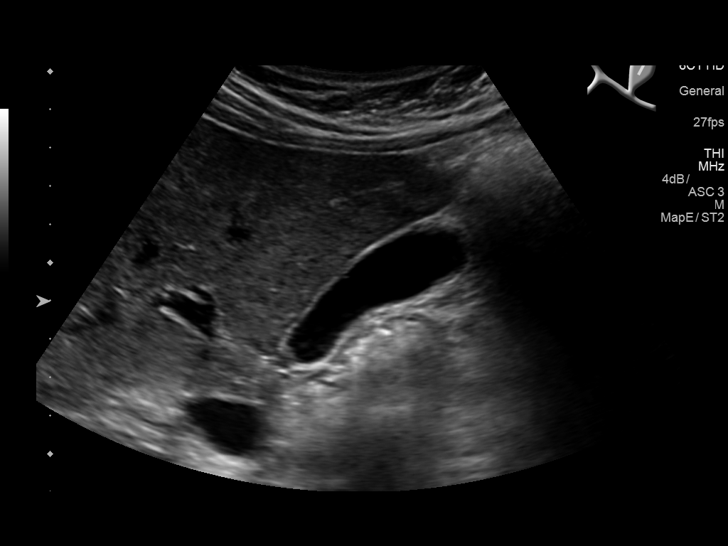
[im 18/104]
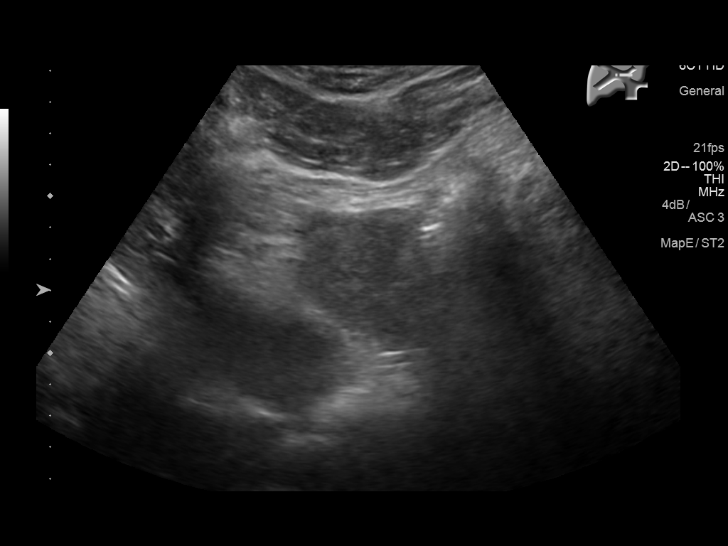
[im 26/104]
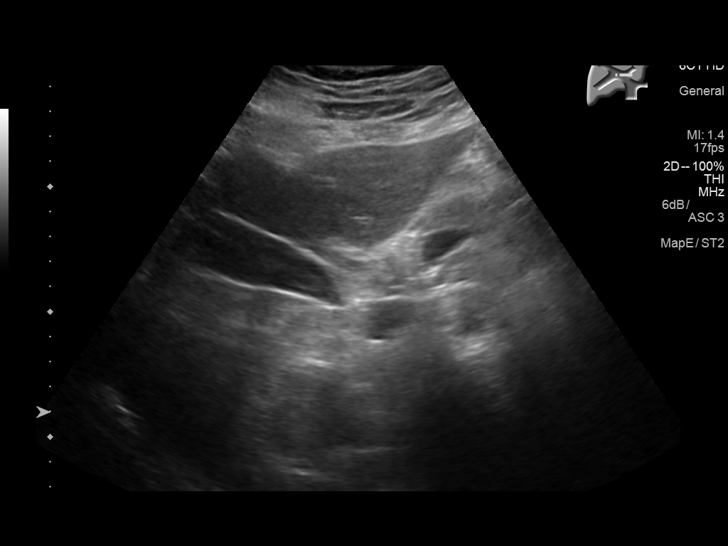
[im 35/104]
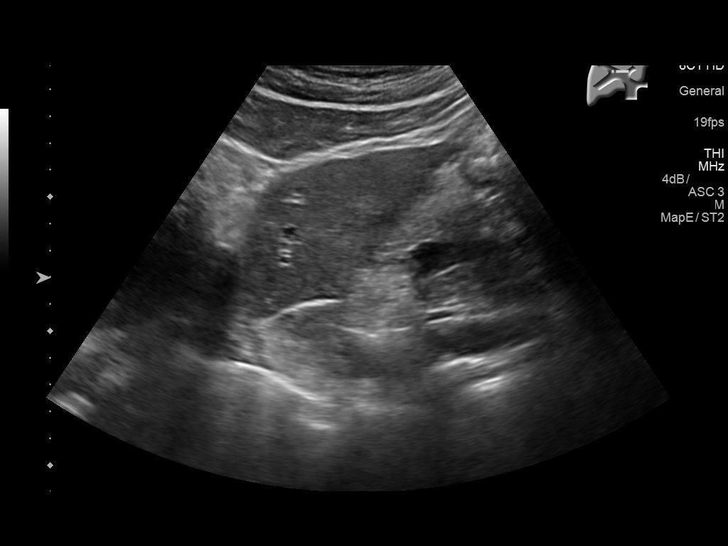
[im 43/104]
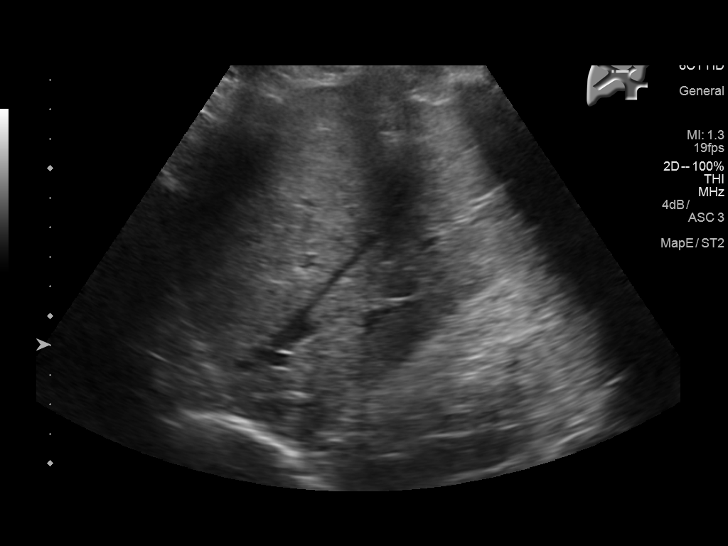
[im 52/104]
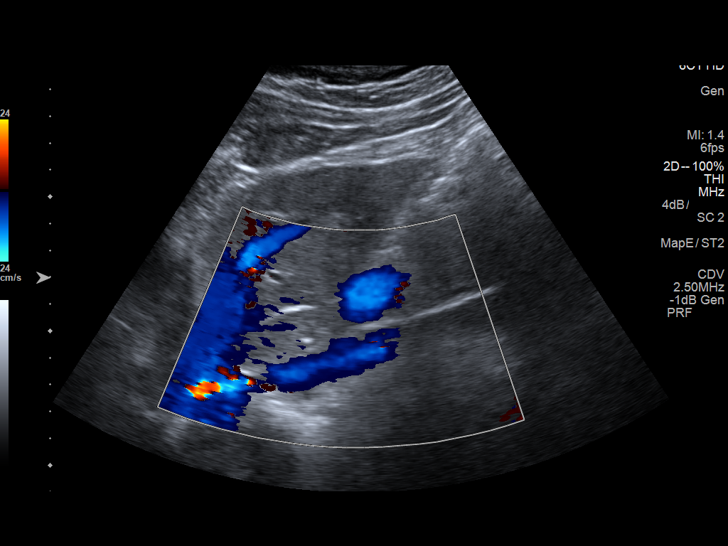
[im 61/104]
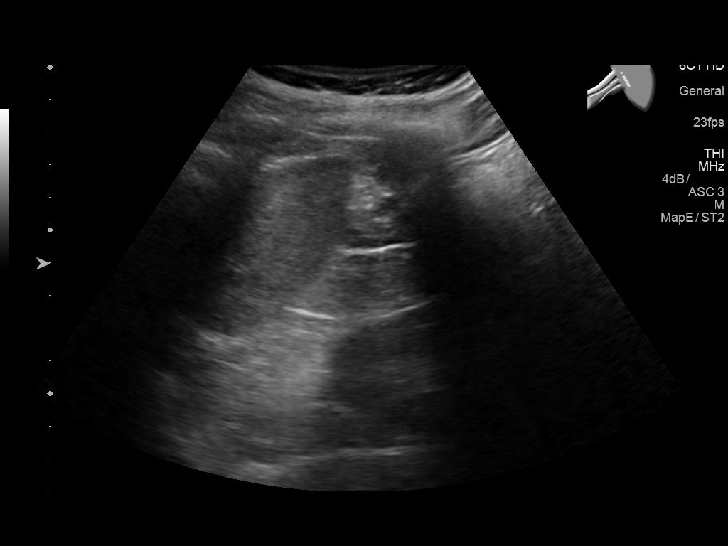
[im 69/104]
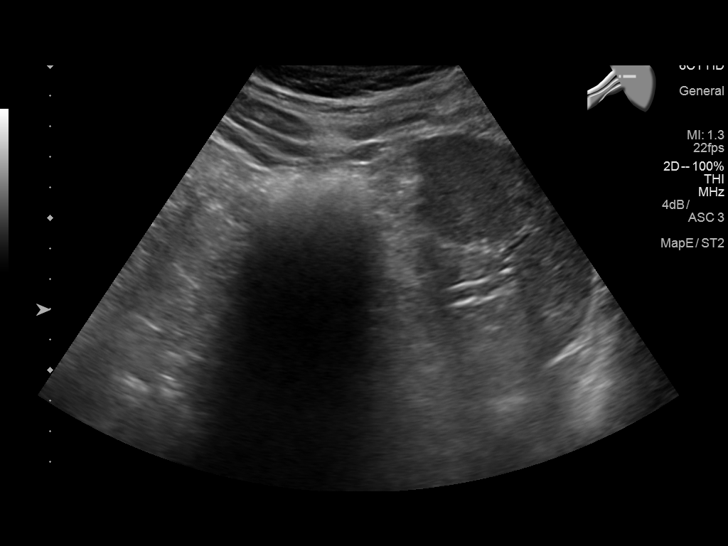
[im 78/104]
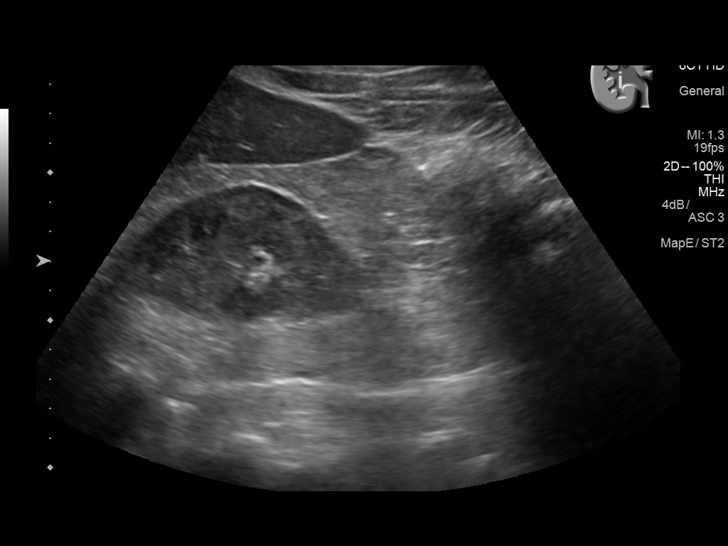
[im 86/104]
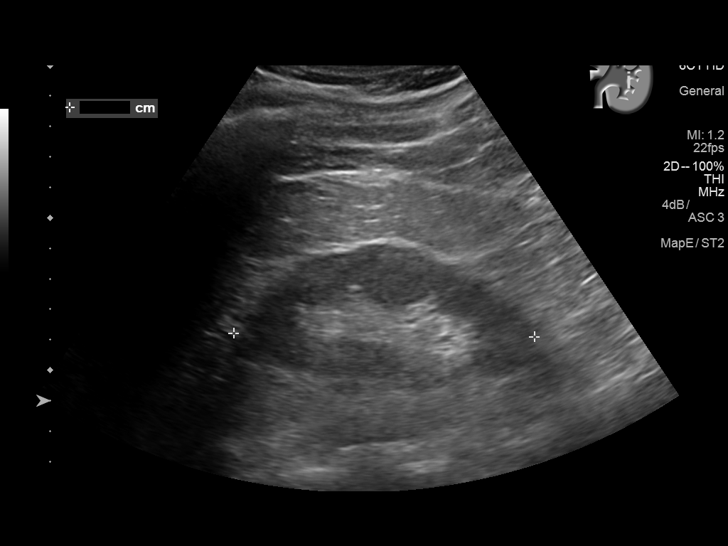
[im 95/104]
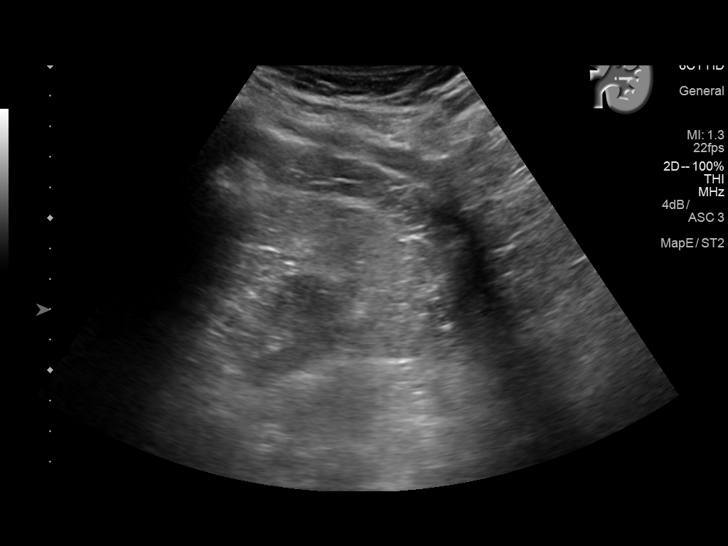
[im 104/104]
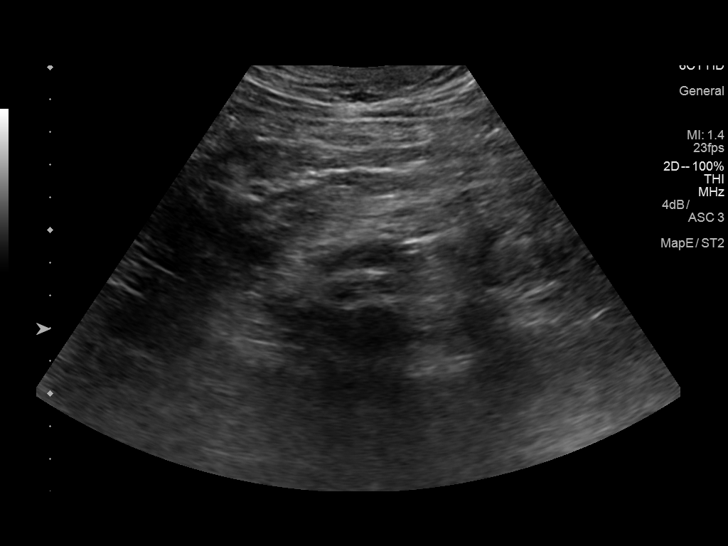

[13 of 25 positions shown; findings below may reference images not displayed]

FINDINGS: Gallbladder: No gallstones or wall thickening visualized. No
sonographic Murphy sign noted by sonographer.

Common bile duct: Diameter: 5.7 mm

Liver: No focal lesion identified. Within normal limits in
parenchymal echogenicity.

IVC: No abnormality visualized.

Pancreas: Visualization of the pancreatic tail is limited due to
bowel gas. The pancreatic head and body are normal in appearance.

Spleen: The spleen is normal in size and echotexture. A splenic
lesion previously demonstrated on the October 2008 MRI is not evident
on today's ultrasound.

Right Kidney: Length: 9.9 cm. Echogenicity within normal limits. No
mass or hydronephrosis visualized.

Left Kidney: Length: 9.9 cm. Echogenicity within normal limits. No
mass or hydronephrosis visualized.

Abdominal aorta: No aneurysm visualized.

Other findings: There is no ascites.
IMPRESSION: No splenomegaly or discrete splenic lesion is observed. On the
previous MRI a solid mass in the spleen was felt to reflect a benign
lesion such is a hamartoma.

No gallstones or sonographic evidence of acute cholecystitis. If
there are clinical concerns of chronic cholecystitis, a nuclear
medicine hepatobiliary scan with gallbladder ejection fraction
determination may be useful.

The kidneys exhibit no evidence of obstruction or other acute
abnormality.

## 2018-12-25 DIAGNOSIS — I69354 Hemiplegia and hemiparesis following cerebral infarction affecting left non-dominant side: Secondary | ICD-10-CM | POA: Diagnosis not present

## 2018-12-25 DIAGNOSIS — S72042D Displaced fracture of base of neck of left femur, subsequent encounter for closed fracture with routine healing: Secondary | ICD-10-CM | POA: Diagnosis not present

## 2018-12-25 DIAGNOSIS — M6281 Muscle weakness (generalized): Secondary | ICD-10-CM | POA: Diagnosis not present

## 2019-01-05 DIAGNOSIS — Z7189 Other specified counseling: Secondary | ICD-10-CM | POA: Diagnosis not present

## 2019-01-07 ENCOUNTER — Telehealth: Payer: Self-pay | Admitting: Cardiovascular Disease

## 2019-01-10 NOTE — Telephone Encounter (Signed)
Opened in error

## 2019-01-11 DIAGNOSIS — T148XXA Other injury of unspecified body region, initial encounter: Secondary | ICD-10-CM | POA: Diagnosis not present

## 2019-01-11 DIAGNOSIS — L989 Disorder of the skin and subcutaneous tissue, unspecified: Secondary | ICD-10-CM | POA: Diagnosis not present

## 2019-01-11 DIAGNOSIS — L299 Pruritus, unspecified: Secondary | ICD-10-CM | POA: Diagnosis not present

## 2019-01-14 DIAGNOSIS — Z20828 Contact with and (suspected) exposure to other viral communicable diseases: Secondary | ICD-10-CM | POA: Diagnosis not present

## 2019-01-20 NOTE — Telephone Encounter (Signed)
Encounter not needed

## 2019-01-24 DIAGNOSIS — S72042D Displaced fracture of base of neck of left femur, subsequent encounter for closed fracture with routine healing: Secondary | ICD-10-CM | POA: Diagnosis not present

## 2019-01-24 DIAGNOSIS — I69354 Hemiplegia and hemiparesis following cerebral infarction affecting left non-dominant side: Secondary | ICD-10-CM | POA: Diagnosis not present

## 2019-01-24 DIAGNOSIS — M6281 Muscle weakness (generalized): Secondary | ICD-10-CM | POA: Diagnosis not present

## 2019-01-30 ENCOUNTER — Emergency Department (HOSPITAL_COMMUNITY)
Admission: EM | Admit: 2019-01-30 | Discharge: 2019-01-30 | Disposition: A | Discharge status: Home / Self Care | Payer: Medicare HMO | Attending: Emergency Medicine | Admitting: Emergency Medicine

## 2019-01-30 ENCOUNTER — Encounter (HOSPITAL_COMMUNITY): Payer: Self-pay | Admitting: Emergency Medicine

## 2019-01-30 ENCOUNTER — Emergency Department (HOSPITAL_COMMUNITY): Payer: Medicare HMO

## 2019-01-30 DIAGNOSIS — I10 Essential (primary) hypertension: Secondary | ICD-10-CM | POA: Diagnosis not present

## 2019-01-30 DIAGNOSIS — Z79899 Other long term (current) drug therapy: Secondary | ICD-10-CM | POA: Diagnosis not present

## 2019-01-30 DIAGNOSIS — G44209 Tension-type headache, unspecified, not intractable: Secondary | ICD-10-CM | POA: Diagnosis not present

## 2019-01-30 DIAGNOSIS — D3501 Benign neoplasm of right adrenal gland: Secondary | ICD-10-CM | POA: Diagnosis not present

## 2019-01-30 DIAGNOSIS — I2510 Atherosclerotic heart disease of native coronary artery without angina pectoris: Secondary | ICD-10-CM | POA: Insufficient documentation

## 2019-01-30 DIAGNOSIS — Z7902 Long term (current) use of antithrombotics/antiplatelets: Secondary | ICD-10-CM | POA: Diagnosis not present

## 2019-01-30 DIAGNOSIS — H538 Other visual disturbances: Secondary | ICD-10-CM | POA: Diagnosis not present

## 2019-01-30 DIAGNOSIS — E871 Hypo-osmolality and hyponatremia: Secondary | ICD-10-CM | POA: Insufficient documentation

## 2019-01-30 DIAGNOSIS — R51 Headache: Secondary | ICD-10-CM | POA: Diagnosis not present

## 2019-01-30 DIAGNOSIS — R52 Pain, unspecified: Secondary | ICD-10-CM | POA: Diagnosis not present

## 2019-01-30 LAB — CBC
HCT: 42.9 % (ref 39.0–52.0)
Hemoglobin: 14.5 g/dL (ref 13.0–17.0)
MCH: 33.4 pg (ref 26.0–34.0)
MCHC: 33.8 g/dL (ref 30.0–36.0)
MCV: 98.8 fL (ref 80.0–100.0)
Platelets: 147 10*3/uL — ABNORMAL LOW (ref 150–400)
RBC: 4.34 MIL/uL (ref 4.22–5.81)
RDW: 12.3 % (ref 11.5–15.5)
WBC: 6.2 10*3/uL (ref 4.0–10.5)
nRBC: 0 % (ref 0.0–0.2)

## 2019-01-30 LAB — BASIC METABOLIC PANEL
Anion gap: 10 (ref 5–15)
BUN: 15 mg/dL (ref 8–23)
CO2: 22 mmol/L (ref 22–32)
Calcium: 8.4 mg/dL — ABNORMAL LOW (ref 8.9–10.3)
Chloride: 93 mmol/L — ABNORMAL LOW (ref 98–111)
Creatinine, Ser: 1.12 mg/dL (ref 0.61–1.24)
GFR calc Af Amer: >60 mL/min (ref >60)
GFR calc non Af Amer: >60 mL/min (ref >60)
Glucose, Bld: 102 mg/dL — ABNORMAL HIGH (ref 70–99)
Potassium: 4.5 mmol/L (ref 3.5–5.1)
Sodium: 125 mmol/L — ABNORMAL LOW (ref 135–145)

## 2019-01-30 MED ORDER — SODIUM CHLORIDE 0.9 % IV BOLUS (SEPSIS)
500.0000 mL | Freq: Once | INTRAVENOUS | Status: AC
  Administered 2019-01-30: 500 mL via INTRAVENOUS

## 2019-01-30 MED ORDER — METOCLOPRAMIDE HCL 5 MG/ML IJ SOLN
10.0000 mg | Freq: Once | INTRAMUSCULAR | Status: AC
  Administered 2019-01-30: 10 mg via INTRAVENOUS
  Filled 2019-01-30: qty 2

## 2019-01-30 MED ORDER — KETOROLAC TROMETHAMINE 30 MG/ML IJ SOLN
15.0000 mg | Freq: Once | INTRAMUSCULAR | Status: AC
  Administered 2019-01-30: 15 mg via INTRAVENOUS
  Filled 2019-01-30: qty 1

## 2019-01-30 MED ORDER — DEXAMETHASONE SODIUM PHOSPHATE 10 MG/ML IJ SOLN
10.0000 mg | Freq: Once | INTRAMUSCULAR | Status: AC
  Administered 2019-01-30: 10 mg via INTRAVENOUS
  Filled 2019-01-30: qty 1

## 2019-01-30 MED ORDER — DIPHENHYDRAMINE HCL 50 MG/ML IJ SOLN
25.0000 mg | Freq: Once | INTRAMUSCULAR | Status: AC
  Administered 2019-01-30: 25 mg via INTRAVENOUS
  Filled 2019-01-30: qty 1

## 2019-01-30 NOTE — ED Notes (Signed)
Patient verbalizes understanding of discharge instructions. Opportunity for questioning and answers were provided. Armband removed by staff, pt discharged from ED home via POV.  

## 2019-01-30 NOTE — ED Provider Notes (Signed)
TIME SEEN: 2:18 AM  CHIEF COMPLAINT: Headache  HPI: Patient is a 73 year old male with history of previous stroke with left-sided deficits on Plavix, previous cervical fusion in 1989, hypertension, hyperlipidemia who presents to the emergency department with a posterior headache.  States headache started in the back of his neck and then is in the back of his head described as a tightness for the past 3 days.  It was gradual in onset.  He has had similar headaches before.  States he went to [facility] and received Reglan, Benadryl and Decadron which resolved his symptoms.  It appears per our records at this was in 2017.  He denies any new numbness, tingling or focal weakness.  No chest pain or shortness of breath.  No fever.  No head injury.  States he became concerned and wanted head imaging because he is on Plavix.  He denies head injury.  States blood pressure at home was in the 190s/100s.  It is now in the 120s/60s.  States he got his blood pressure better by drinking Maalox and eating a banana prior to arrival.  ROS: See HPI Constitutional: no fever  Eyes: no drainage  ENT: no runny nose   Cardiovascular:  no chest pain  Resp: no SOB  GI: no vomiting GU: no dysuria Integumentary: no rash  Allergy: no hives  Musculoskeletal: no leg swelling  Neurological: no slurred speech ROS otherwise negative  PAST MEDICAL HISTORY/PAST SURGICAL HISTORY:  Past Medical History:  Diagnosis Date  . Adrenal gland disorder ([place])    tumor present - no change-  no recent increase    . Arthritis   . Benign pheochromocytoma of right adrenal gland 06/23/2014   Never had histological diagnosis, but reportedly initially diagnosed in 1989  . Bunion    left foot  . Complication of anesthesia    "died on the table during cervical fusion" '89 - believed to be d/t "too much medication" to treat HTN and was later dx with pheochromocytoma  . Coronary artery disease    cleared for surg. by Dr. Gwenlyn Found -  [facility]  . H/O hiatal hernia   . Hammer toe   . Herniated lumbar intervertebral disc   . History of anxiety   . Hyperlipidemia   . Hypertension   . Neurogenic bladder   . Neuromuscular disorder ([place])    carpal tunnel problem, even after surgery release   . Peripheral arterial disease ([place])   . Primary aldosteronism ([place])   . Stroke ([place])    in [place], L sided weakness, wears a brace on L leg & uses cane    MEDICATIONS:  Prior to Admission medications   Medication Sig Start Date End Date Taking? Authorizing Provider  clopidogrel (PLAVIX) 75 MG tablet Take 1 tablet (75 mg total) by mouth daily. 03/05/17   Croitoru, Mihai, MD  finasteride (PROSCAR) 5 MG tablet Take 5 mg by mouth daily. 01/19/18   [provider]  FLOVENT HFA 110 MCG/ACT inhaler Inhale 1 puff into the lungs 3 (three) times daily.  04/13/14   [provider]  Glucosamine-Chondroit-Vit C-Mn (FLEX-DS PO) Take by mouth 2 (two) times daily.    [provider]  labetalol (NORMODYNE) 300 MG tablet Take 1 tablet (300 mg total) by mouth 2 (two) times daily. 08/04/18   Croitoru, Mihai, MD  lactobacillus acidophilus (BACID) TABS tablet Take 2 tablets by mouth 2 (two) times daily. 04/20/18   Alma Friendly, MD  NIFEdipine (ADALAT CC) 60  MG 24 hr tablet Take 1 tablet (60 mg total) by mouth every evening. (use other 30 mg Rx in the MORNING ONLY) 08/04/18   Croitoru, Dani Gobble, MD  OVER THE COUNTER MEDICATION graminex-1 capsule daily    [provider]  OVER THE COUNTER MEDICATION 2 (two) times daily. Ultimate pro support    [provider]  senna-docusate (SENOKOT-S) 8.6-50 MG tablet Take 3 tablets by mouth every evening. 04/20/18   Alma Friendly, MD  simvastatin (ZOCOR) 40 MG tablet Take 1 tablet (40 mg total) by mouth every evening. 08/25/16   Dorena Dew, FNP  spironolactone (ALDACTONE) 100 MG tablet Take 1 tablet (100 mg total) by mouth daily. 04/21/18   Alma Friendly, MD   tamsulosin (FLOMAX) 0.4 MG CAPS capsule Take 1 capsule (0.4 mg total) by mouth daily. Patient taking differently: Take 0.4 mg by mouth 2 (two) times daily.  01/09/17   Dorena Dew, FNP    ALLERGIES:  Allergies  Allergen Reactions  . Elavil [Amitriptyline Hcl] Hypertension  . Sulfa Antibiotics     SOCIAL HISTORY:  Social History   Tobacco Use  . Smoking status: Never Smoker  . Smokeless tobacco: Never Used  Substance Use Topics  . Alcohol use: Yes    Comment: social - one drink, on special occasion     FAMILY HISTORY: Family History  Problem Relation Age of Onset  . CVA Father   . Diabetes Father   . Alzheimer's disease Father   . Hypertension Sister   . Hypertension Brother   . CVA Paternal Grandmother   . Hypertension Brother   . Hypertension Mother     EXAM: BP 121/69 (BP Location: Right Arm)   Pulse 71   Temp 98.4 F (36.9 C) (Oral)   Resp 18   Ht 5\' 7"  (1.702 m)   Wt 70.3 kg   SpO2 98%   BMI 24.28 kg/m  CONSTITUTIONAL: Alert and oriented and responds appropriately to questions. Well-appearing; well-nourished, elderly, no distress HEAD: Normocephalic, atraumatic EYES: Conjunctivae clear, pupils appear equal, EOMI ENT: normal nose; moist mucous membranes NECK: Supple, no meningismus, no nuchal rigidity, no LAD, tender to palpation over the cervical paraspinal muscles bilaterally and over the occiput CARD: RRR; S1 and S2 appreciated; no murmurs, no clicks, no rubs, no gallops RESP: Normal chest excursion without splinting or tachypnea; breath sounds clear and equal bilaterally; no wheezes, no rhonchi, no rales, no hypoxia or respiratory distress, speaking full sentences ABD/GI: Normal bowel sounds; non-distended; soft, non-tender, no rebound, no guarding, no peritoneal signs, no hepatosplenomegaly BACK:  The back appears normal and is non-tender to palpation, there is no CVA tenderness EXT: Normal ROM in all joints; non-tender to palpation; no edema;  normal capillary refill; no cyanosis, no calf tenderness or swelling    SKIN: Normal color for age and race; warm; no rash NEURO: contracture and weakness of the left upper extremity which is chronic from previous stroke, weakness in the left lower extremity which is chronic, normal strength in the right upper and lower extremity, cranial nerves II through XII intact, normal speech, sensation to light touch intact diffusely PSYCH: The patient's mood and manner are appropriate. Grooming and personal hygiene are appropriate.  MEDICAL DECISION MAKING: Patient here with general onset headache.  Likely a tension headache secondary to cervical spine muscles.  He has no new focal neurologic deficits.  He does report he has had similar headaches in in 2017 it was treated with a migraine cocktail.  It appears he received Reglan, Benadryl and Decadron which she is requesting again today.  He is not diabetic.  Came in by EMS.  Doubt intracranial hemorrhage, stroke, meningitis.  He is requesting head imaging today given he is on Plavix which I do not feel is unreasonable.  Will reassess after migraine cocktail.  ED PROGRESS: Patient's head CT shows no acute abnormality.  His labs show hyponatremia with a sodium of 125.  We will give him a 500 mL IV fluid bolus.  I do not feel he needs admission at this time for this hyponatremia given he is asymptomatic from it but I recommended close follow-up with his PCP.  He states headache is improved with Reglan, Benadryl and Decadron but not completely gone.  He is requesting something else for headache.  Will give dose of IV Toradol.  7:25 AM  Pt reports headache is almost completely resolved and he is comfortable with plan for discharge home.  We have discussed that he has hyponatremia today and have recommended that he have this rechecked by his PCP.  We have discussed strict return precautions.  Patient is comfortable with this plan.   At this time, I do not feel there is  any life-threatening condition present. I have reviewed and discussed all results (EKG, imaging, lab, urine as appropriate) and exam findings with patient/family. I have reviewed nursing notes and appropriate previous records.  I feel the patient is safe to be discharged home without further emergent workup and can continue workup as an outpatient as needed. Discussed usual and customary return precautions. Patient/family verbalize understanding and are comfortable with this plan.  Outpatient follow-up has been provided as needed. All questions have been answered.      Honi Name, Delice Bison, DO 01/30/19 [phone]

## 2019-01-30 NOTE — ED Triage Notes (Signed)
BIB EMS from home. Pt reports HA/HTN X3 days, R cheek numbness onset earlier this evening. Hx of stroke, with L sided deficits, contracted on L side. VSS.

## 2019-01-30 NOTE — Discharge Instructions (Addendum)
You may take Tylenol 1000 mg every 6 hours as needed for pain.  Your head CT today was normal.  I recommend that you follow-up with your primary care provider this week to have your sodium rechecked.  It was 125 here in the emergency department.  You were given 500 mL normal saline IV fluids.

## 2019-02-03 DIAGNOSIS — I69354 Hemiplegia and hemiparesis following cerebral infarction affecting left non-dominant side: Secondary | ICD-10-CM | POA: Diagnosis not present

## 2019-02-03 DIAGNOSIS — E871 Hypo-osmolality and hyponatremia: Secondary | ICD-10-CM | POA: Diagnosis not present

## 2019-02-03 DIAGNOSIS — M2550 Pain in unspecified joint: Secondary | ICD-10-CM | POA: Diagnosis not present

## 2019-02-08 ENCOUNTER — Telehealth: Payer: Self-pay | Admitting: Cardiovascular Disease

## 2019-02-08 NOTE — Telephone Encounter (Signed)
S/w Clarene Critchley and stated they draw blood from pt spin it down, add calcium and inject back in to pt.  Helps with joints and rejuvenates tissue on the myelin sheaths and nerves This is to help pt who had a stroke.  Stated pt cannot be on plavix for 6 days prior to procedure or 6 days after procedure for the injection to work. If pt is on blood thinner the plasma does not adhere to other cells due to the calcium.  Pt can be on ASA but no NSAIDS.

## 2019-02-08 NOTE — Telephone Encounter (Addendum)
   Primary Cardiologist:Mihai Croitoru, MD  Thank you. Patient has extensive history of CAD, LE PAD, possible renal artery stenosis (not seen on f/u study), prior stroke with hemiparesis, CAD s/p several prior stents, HTN related to adrenal gland tumor, HLD. Last PCI that I can find outlined in chart was 10/2009. As below, procedure will require patient to be off Plavix for at least 6 days prior to procedure and 6 days after procedure for plasma joint injection. Will route to Dr. Sallyanne Kuster for input if he would be comfortable with patient undergoing this kind of hold. Per surgeon note, he can be on ASA for this - also woud like clarification on recommendation for this as he only has a one time dose listed in [facility] (was not on it at last telemed visit 11/09/18).  Dr. Sallyanne Kuster - - Please route response to P CV DIV PREOP (the pre-op pool). Thank you.  Charlie Pitter, PA-C 02/08/2019, 4:32 PM

## 2019-02-08 NOTE — Telephone Encounter (Signed)
Callback, can you find out what the platelet rich plasma is for and further details about what kind of treatment this is? (Infusion, injection, etc)

## 2019-02-08 NOTE — Telephone Encounter (Signed)
New Message        [facility] Pre-operative Risk Assessment    Request for surgical clearance:  1. What type of surgery is being performed? Platelet Rich Plasma   2. When is this surgery scheduled? TBD   3. What type of clearance is required (medical clearance vs. Pharmacy clearance to hold med vs. Both)? BOTH   4. Are there any medications that need to be held prior to surgery and how long? Plavix, dont know for how long   5. Practice name and name of physician performing surgery? Dr Niel Hummer   6. What is your office phone number [phone]   7.   What is your office fax number [phone]  8.   Anesthesia type (None, local, MAC, general) ? None    Howard Jackson 02/08/2019, 1:22 PM  _________________________________________________________________   (provider comments below)

## 2019-02-10 NOTE — Telephone Encounter (Signed)
Howard Jackson from surgeon's office is calling to check the status of pt's clearance. She was advised we are waiting on input from Cardiologist and as soon as it has been determined yay or nay, she will get a fax. Confirmed fax # with her.  She appreciated [place] speaking with her.

## 2019-02-13 ENCOUNTER — Encounter: Payer: Self-pay | Admitting: Cardiovascular Disease

## 2019-02-13 NOTE — Telephone Encounter (Signed)
Letter sent to Dr. Niel Hummer via epic.

## 2019-02-21 DIAGNOSIS — I129 Hypertensive chronic kidney disease with stage 1 through stage 4 chronic kidney disease, or unspecified chronic kidney disease: Secondary | ICD-10-CM | POA: Diagnosis not present

## 2019-02-21 DIAGNOSIS — N189 Chronic kidney disease, unspecified: Secondary | ICD-10-CM | POA: Diagnosis not present

## 2019-02-21 DIAGNOSIS — D497 Neoplasm of unspecified behavior of endocrine glands and other parts of nervous system: Secondary | ICD-10-CM | POA: Diagnosis not present

## 2019-02-24 DIAGNOSIS — S72042D Displaced fracture of base of neck of left femur, subsequent encounter for closed fracture with routine healing: Secondary | ICD-10-CM | POA: Diagnosis not present

## 2019-02-24 DIAGNOSIS — M6281 Muscle weakness (generalized): Secondary | ICD-10-CM | POA: Diagnosis not present

## 2019-02-24 DIAGNOSIS — I69354 Hemiplegia and hemiparesis following cerebral infarction affecting left non-dominant side: Secondary | ICD-10-CM | POA: Diagnosis not present

## 2019-03-11 DIAGNOSIS — M79661 Pain in right lower leg: Secondary | ICD-10-CM | POA: Diagnosis not present

## 2019-03-11 DIAGNOSIS — M7989 Other specified soft tissue disorders: Secondary | ICD-10-CM | POA: Diagnosis not present

## 2019-03-11 DIAGNOSIS — I739 Peripheral vascular disease, unspecified: Secondary | ICD-10-CM | POA: Diagnosis not present

## 2019-03-11 DIAGNOSIS — I10 Essential (primary) hypertension: Secondary | ICD-10-CM | POA: Diagnosis not present

## 2019-03-15 ENCOUNTER — Other Ambulatory Visit: Payer: Self-pay | Admitting: Family Medicine

## 2019-03-15 DIAGNOSIS — M79661 Pain in right lower leg: Secondary | ICD-10-CM

## 2019-03-15 DIAGNOSIS — M7989 Other specified soft tissue disorders: Secondary | ICD-10-CM

## 2019-03-18 ENCOUNTER — Ambulatory Visit
Admission: RE | Admit: 2019-03-18 | Discharge: 2019-03-18 | Disposition: A | Discharge status: Home / Self Care | Payer: Medicare HMO | Source: Ambulatory Visit | Attending: Family Medicine | Admitting: Family Medicine

## 2019-03-18 DIAGNOSIS — R600 Localized edema: Secondary | ICD-10-CM | POA: Diagnosis not present

## 2019-03-18 DIAGNOSIS — M7989 Other specified soft tissue disorders: Secondary | ICD-10-CM

## 2019-03-18 DIAGNOSIS — M79661 Pain in right lower leg: Secondary | ICD-10-CM

## 2019-03-27 DIAGNOSIS — I69354 Hemiplegia and hemiparesis following cerebral infarction affecting left non-dominant side: Secondary | ICD-10-CM | POA: Diagnosis not present

## 2019-03-27 DIAGNOSIS — M6281 Muscle weakness (generalized): Secondary | ICD-10-CM | POA: Diagnosis not present

## 2019-03-27 DIAGNOSIS — S72042D Displaced fracture of base of neck of left femur, subsequent encounter for closed fracture with routine healing: Secondary | ICD-10-CM | POA: Diagnosis not present

## 2019-04-12 DIAGNOSIS — M79641 Pain in right hand: Secondary | ICD-10-CM | POA: Diagnosis not present

## 2019-04-26 DIAGNOSIS — S72042D Displaced fracture of base of neck of left femur, subsequent encounter for closed fracture with routine healing: Secondary | ICD-10-CM | POA: Diagnosis not present

## 2019-04-26 DIAGNOSIS — I69354 Hemiplegia and hemiparesis following cerebral infarction affecting left non-dominant side: Secondary | ICD-10-CM | POA: Diagnosis not present

## 2019-04-26 DIAGNOSIS — M6281 Muscle weakness (generalized): Secondary | ICD-10-CM | POA: Diagnosis not present

## 2019-04-27 ENCOUNTER — Other Ambulatory Visit: Payer: Self-pay | Admitting: Cardiovascular Disease

## 2019-04-27 DIAGNOSIS — Z79899 Other long term (current) drug therapy: Secondary | ICD-10-CM | POA: Diagnosis not present

## 2019-04-27 DIAGNOSIS — I10 Essential (primary) hypertension: Secondary | ICD-10-CM | POA: Diagnosis not present

## 2019-04-27 DIAGNOSIS — E279 Disorder of adrenal gland, unspecified: Secondary | ICD-10-CM | POA: Diagnosis not present

## 2019-04-27 DIAGNOSIS — Z23 Encounter for immunization: Secondary | ICD-10-CM | POA: Diagnosis not present

## 2019-04-27 DIAGNOSIS — E2609 Other primary hyperaldosteronism: Secondary | ICD-10-CM | POA: Diagnosis not present

## 2019-04-28 ENCOUNTER — Other Ambulatory Visit: Payer: Self-pay | Admitting: Cardiovascular Disease

## 2019-04-28 DIAGNOSIS — G8191 Hemiplegia, unspecified affecting right dominant side: Secondary | ICD-10-CM

## 2019-04-28 DIAGNOSIS — I2510 Atherosclerotic heart disease of native coronary artery without angina pectoris: Secondary | ICD-10-CM

## 2019-05-10 DIAGNOSIS — I639 Cerebral infarction, unspecified: Secondary | ICD-10-CM | POA: Diagnosis not present

## 2019-05-10 DIAGNOSIS — J4520 Mild intermittent asthma, uncomplicated: Secondary | ICD-10-CM | POA: Diagnosis not present

## 2019-05-10 DIAGNOSIS — I2510 Atherosclerotic heart disease of native coronary artery without angina pectoris: Secondary | ICD-10-CM | POA: Diagnosis not present

## 2019-05-10 DIAGNOSIS — N401 Enlarged prostate with lower urinary tract symptoms: Secondary | ICD-10-CM | POA: Diagnosis not present

## 2019-05-10 DIAGNOSIS — I10 Essential (primary) hypertension: Secondary | ICD-10-CM | POA: Diagnosis not present

## 2019-05-19 DIAGNOSIS — N401 Enlarged prostate with lower urinary tract symptoms: Secondary | ICD-10-CM | POA: Diagnosis not present

## 2019-05-19 DIAGNOSIS — N138 Other obstructive and reflux uropathy: Secondary | ICD-10-CM | POA: Diagnosis not present

## 2019-05-26 DIAGNOSIS — J4520 Mild intermittent asthma, uncomplicated: Secondary | ICD-10-CM | POA: Diagnosis not present

## 2019-05-26 DIAGNOSIS — N401 Enlarged prostate with lower urinary tract symptoms: Secondary | ICD-10-CM | POA: Diagnosis not present

## 2019-05-26 DIAGNOSIS — E785 Hyperlipidemia, unspecified: Secondary | ICD-10-CM | POA: Diagnosis not present

## 2019-05-26 DIAGNOSIS — I2510 Atherosclerotic heart disease of native coronary artery without angina pectoris: Secondary | ICD-10-CM | POA: Diagnosis not present

## 2019-05-26 DIAGNOSIS — I639 Cerebral infarction, unspecified: Secondary | ICD-10-CM | POA: Diagnosis not present

## 2019-05-26 DIAGNOSIS — I10 Essential (primary) hypertension: Secondary | ICD-10-CM | POA: Diagnosis not present

## 2019-05-27 DIAGNOSIS — I69354 Hemiplegia and hemiparesis following cerebral infarction affecting left non-dominant side: Secondary | ICD-10-CM | POA: Diagnosis not present

## 2019-05-27 DIAGNOSIS — M6281 Muscle weakness (generalized): Secondary | ICD-10-CM | POA: Diagnosis not present

## 2019-05-27 DIAGNOSIS — S72042D Displaced fracture of base of neck of left femur, subsequent encounter for closed fracture with routine healing: Secondary | ICD-10-CM | POA: Diagnosis not present

## 2019-06-01 ENCOUNTER — Other Ambulatory Visit: Payer: Self-pay | Admitting: Cardiovascular Disease

## 2019-06-02 ENCOUNTER — Other Ambulatory Visit: Payer: Self-pay | Admitting: Cardiovascular Disease

## 2019-06-03 ENCOUNTER — Other Ambulatory Visit: Payer: Self-pay | Admitting: Cardiovascular Disease

## 2019-06-05 NOTE — Progress Notes (Signed)
Cardiology Clinic Note   Patient Name: Howard Jackson Date of Encounter: 06/06/2019  Primary Care Provider:  Kristen Loader, FNP Primary Cardiologist:  Sanda Klein, MD  Patient Profile    Howard Jackson 73 year old male presents to the clinic today for follow-up of his peripheral arterial disease, essential hypertension, coronary artery disease, dyslipidemia, lower extremity edema, and carotid stenosis.  Past Medical History    Past Medical History:  Diagnosis Date  . Adrenal gland disorder ([place])    tumor present - no change-  no recent increase    . Arthritis   . Benign pheochromocytoma of right adrenal gland 06/23/2014   Never had histological diagnosis, but reportedly initially diagnosed in 1989  . Bunion    left foot  . Complication of anesthesia    "died on the table during cervical fusion" '89 - believed to be d/t "too much medication" to treat HTN and was later dx with pheochromocytoma  . Coronary artery disease    cleared for surg. by Dr. Gwenlyn Found - [facility]  . H/O hiatal hernia   . Hammer toe   . Herniated lumbar intervertebral disc   . History of anxiety   . Hyperlipidemia   . Hypertension   . Neurogenic bladder   . Neuromuscular disorder ([place])    carpal tunnel problem, even after surgery release   . Peripheral arterial disease ([place])   . Primary aldosteronism ([place])   . Stroke ([place])    in [place], L sided weakness, wears a brace on L leg & uses cane   Past Surgical History:  Procedure Laterality Date  . ANTERIOR APPROACH HEMI HIP ARTHROPLASTY Left 04/15/2018   Procedure: ANTERIOR APPROACH HEMI HIP ARTHROPLASTY;  Surgeon: Rod Can, MD;  Location: [place];  Service: Orthopedics;  Laterality: Left;  . CARPAL TUNNEL RELEASE     bilateral   . CERVICAL FUSION    . CORONARY ANGIOPLASTY WITH STENT PLACEMENT     pt reports having 5 stents in his heart-2011  . HERNIA REPAIR    . INGUINAL HERNIA REPAIR Left 11/18/2012   Procedure: HERNIA REPAIR INGUINAL  ADULT;  Surgeon: Harl Bowie, MD;  Location: [place];  Service: General;  Laterality: Left;  . INSERTION OF MESH Left 11/18/2012   Procedure: INSERTION OF MESH;  Surgeon: Harl Bowie, MD;  Location: [place];  Service: General;  Laterality: Left;    Allergies  Allergies  Allergen Reactions  . Elavil [Amitriptyline Hcl] Hypertension  . Sulfa Antibiotics Other (See Comments)    Constipation  Other reaction(s): Other (See Comments) Constipation     History of Present Illness    Mr. Jarmin has a past medical history of coronary artery disease, lower extremity peripheral arterial disease, possible renal artery stenosis, prior CVA with hemiparesis, hypertension related to adrenal gland tumor, hyperlipidemia.  His last PCI was 10/2009.  He was recently granted surgical clearance for platelet rich plasma procedure.  His last seen by Dr. Loletha Grayer on 11/09/2018.  During that time he was having labile blood pressures and his nifedipine XL was reduced to 60 mg at bedtime and his 30 mg dose in the a.m. was discontinued.  He was also having some ankle swelling.  With the discontinuation of the nifedipine in the a.m. the hope was that this would reduce the swelling as well.  He is on lifelong clopidogrel due to his sequela of CVA.  Carotid Dopplers on 07/2018 showed no evidence of significant obstruction right/left coronary arteries.  He presents the clinic today and states he has been doing well. He did not have his shoulder PRP injection.  He states that the injection was going to be very costly and he did not know how much benefit it would be.  He states he continues to have some lower extremity swelling however, he notices that if he elevates his lower extremities at night swelling is gone morning.  He continues to try to eat a low-sodium diet.  He is sedentary due to his left-sided hemiparesis.  He denies chest pain, increased shortness of breath, increased lower extremity edema, fatigue, palpitations,  melena, hematuria, hemoptysis, diaphoresis, weakness, presyncope, syncope, orthopnea, and PND.   Home Medications    Prior to Admission medications   Medication Sig Start Date End Date Taking? Authorizing Provider  aspirin 325 MG tablet Take 650 mg by mouth once.    [provider]  clopidogrel (PLAVIX) 75 MG tablet TOME UNA TABLETA TODOS LOS DIAS 04/29/19   Croitoru, Mihai, MD  finasteride (PROSCAR) 5 MG tablet Take 5 mg by mouth daily. 01/19/18   [provider]  FLOVENT HFA 110 MCG/ACT inhaler Inhale 1 puff into the lungs 3 (three) times daily.  04/13/14   [provider]  Glucosamine-Chondroit-Vit C-Mn (FLEX-DS PO) Take 2 tablets by mouth every evening.     [provider]  labetalol (NORMODYNE) 300 MG tablet Take 1 tablet (300 mg total) by mouth 2 (two) times daily. 08/04/18   Croitoru, Mihai, MD  lactobacillus acidophilus (BACID) TABS tablet Take 2 tablets by mouth 2 (two) times daily. 04/20/18   Alma Friendly, MD  NIFEdipine (ADALAT CC) 60 MG 24 hr tablet TAKE 1 TABLET (60 MG TOTAL) BY MOUTH EVERY EVENING. (USE OTHER 30 MG RX IN THE MORNING ONLY) 04/29/19   Croitoru, Mihai, MD  senna-docusate (SENOKOT-S) 8.6-50 MG tablet Take 3 tablets by mouth every evening. Patient taking differently: Take 1 tablet by mouth at bedtime as needed for mild constipation.  04/20/18   Alma Friendly, MD  simvastatin (ZOCOR) 40 MG tablet Take 1 tablet (40 mg total) by mouth every evening. 08/25/16   Dorena Dew, FNP  spironolactone (ALDACTONE) 100 MG tablet Take 1 tablet (100 mg total) by mouth daily. 04/21/18   Alma Friendly, MD  tamsulosin (FLOMAX) 0.4 MG CAPS capsule Take 1 capsule (0.4 mg total) by mouth daily. Patient taking differently: Take 0.4 mg by mouth 2 (two) times daily.  01/09/17   Dorena Dew, FNP    Family History    Family History  Problem Relation Age of Onset  . CVA Father   . Diabetes Father   . Alzheimer's disease Father    . Hypertension Sister   . Hypertension Brother   . CVA Paternal Grandmother   . Hypertension Brother   . Hypertension Mother    He indicated that the status of his mother is unknown. He indicated that his father is deceased. He indicated that his sister is alive. He indicated that both of his brothers are alive. He indicated that his maternal grandmother is deceased. He indicated that his maternal grandfather is deceased. He indicated that his paternal grandmother is deceased. He indicated that his paternal grandfather is deceased.  Social History    Social History   Socioeconomic History  . Marital status: Legally Separated    Spouse name: Not on file  . Number of children: Not on file  . Years of education: Not on file  . Highest  education level: Not on file  Occupational History  . Not on file  Social Needs  . Financial resource strain: Not on file  . Food insecurity    Worry: Not on file    Inability: Not on file  . Transportation needs    Medical: Not on file    Non-medical: Not on file  Tobacco Use  . Smoking status: Never Smoker  . Smokeless tobacco: Never Used  Substance and Sexual Activity  . Alcohol use: Yes    Comment: social - one drink, on special occasion   . Drug use: No  . Sexual activity: Not on file  Lifestyle  . Physical activity    Days per week: Not on file    Minutes per session: Not on file  . Stress: Not on file  Relationships  . Social Herbalist on phone: Not on file    Gets together: Not on file    Attends religious service: Not on file    Active member of club or organization: Not on file    Attends meetings of clubs or organizations: Not on file    Relationship status: Not on file  . Intimate partner violence    Fear of current or ex partner: Not on file    Emotionally abused: Not on file    Physically abused: Not on file    Forced sexual activity: Not on file  Other Topics Concern  . Not on file  Social History Narrative   . Not on file     Review of Systems    General:  No chills, fever, night sweats or weight changes.  Cardiovascular:  No chest pain, dyspnea on exertion, edema, orthopnea, palpitations, paroxysmal nocturnal dyspnea. Dermatological: No rash, lesions/masses Respiratory: No cough, dyspnea Urologic: No hematuria, dysuria Abdominal:   No nausea, vomiting, diarrhea, bright red blood per rectum, melena, or hematemesis Neurologic:  No visual changes, wkns, changes in mental status. All other systems reviewed and are otherwise negative except as noted above.  Physical Exam    VS:  BP 120/70   Pulse 67   Temp 97.7 F (36.5 C)   Ht 5\' 7"  (1.702 m)   Wt 150 lb (68 kg) Comment: wheelchair not sure if accurate  SpO2 98%   BMI 23.49 kg/m  , BMI Body mass index is 23.49 kg/m. GEN: Well nourished, well developed, in no acute distress. HEENT: normal. Neck: Supple, no JVD, carotid bruits, or masses. Cardiac: RRR, no murmurs, rubs, or gallops. No clubbing, cyanosis, +1 bilateral pitting ankle edema.  Radials/DP/PT 2+ and equal bilaterally.  Respiratory:  Respirations regular and unlabored, clear to auscultation bilaterally. GI: Soft, nontender, nondistended, BS + x 4. MS: no deformity or atrophy. Skin: warm and dry, no rash. Neuro:  Strength and sensation are intact. Psych: Normal affect.  Accessory Clinical Findings    ECG personally reviewed by me today-sinus rhythm with premature atrial complexes 67 bpm- No acute changes  EKG 05/15/2018 Normal sinus rhythm 65 bpm  Myocardial perfusion study 10/14/2012 Impression Exercise Capacity:  Lexiscan with no exercise. BP Response:  Normal blood pressure response. Clinical Symptoms:  No significant symptoms noted. ECG Impression:  No significant ST segment change suggestive of ischemia. Comparison with Prior Nuclear Study: No significant change from previous study  Overall Impression:  Normal stress nuclear study.   Assessment & Plan   1.   Coronary artery disease-no chest pain today.  EKG shows sinus rhythm with premature atrial complexes left  axis deviation 67 bpm Continue clopidogrel 75 mg tablet daily Continue aspirin Continue simvastatin 40 mg tablet daily Heart healthy low-sodium high-fiber diet Increase physical activity as tolerated  Lower extremity edema-bilateral ankle +1 pitting. Elevate lower extremities when not active Heart healthy low-sodium diet-salty 6 given Light compression of lower extremities as tolerated  Peripheral arterial disease-no complaints of claudication Continue to monitor  Hypertension-BP today 120/70. Continue labetalol 300 mg twice daily Continue 60 mg tablet every evening Continue spironolactone 100 mg tablet daily Hyperlipidemia-LDL 77 05/22/2016 Continue simvastatin 40 mg at bedtime Heart healthy low-sodium diet-salty 6 given  Left hemiparesis-prior CVA.  Carotid US 07/2018 no evidence of carotid artery obstruction/disease. Continue clopidogrel 75 mg tablet daily Heart healthy low-sodium high-fiber diet Continue rosuvastatin  Disposition: Follow-up with Dr. Sallyanne Kuster in 6 months.  Jossie Ng. [facility] [place] [address] Office [phone] Fax [phone]

## 2019-06-06 ENCOUNTER — Ambulatory Visit (INDEPENDENT_AMBULATORY_CARE_PROVIDER_SITE_OTHER): Payer: Medicare HMO | Admitting: General Practice

## 2019-06-06 ENCOUNTER — Encounter: Payer: Self-pay | Admitting: General Practice

## 2019-06-06 ENCOUNTER — Ambulatory Visit: Payer: Medicare HMO | Admitting: General Practice

## 2019-06-06 ENCOUNTER — Other Ambulatory Visit: Payer: Self-pay

## 2019-06-06 VITALS — BP 120/70 | HR 67 | Temp 97.7°F | Ht 67.0 in | Wt 150.0 lb

## 2019-06-06 DIAGNOSIS — I152 Hypertension secondary to endocrine disorders: Secondary | ICD-10-CM | POA: Diagnosis not present

## 2019-06-06 DIAGNOSIS — I2510 Atherosclerotic heart disease of native coronary artery without angina pectoris: Secondary | ICD-10-CM | POA: Diagnosis not present

## 2019-06-06 DIAGNOSIS — M7989 Other specified soft tissue disorders: Secondary | ICD-10-CM | POA: Diagnosis not present

## 2019-06-06 DIAGNOSIS — I10 Essential (primary) hypertension: Secondary | ICD-10-CM

## 2019-06-06 DIAGNOSIS — G8114 Spastic hemiplegia affecting left nondominant side: Secondary | ICD-10-CM

## 2019-06-06 DIAGNOSIS — I739 Peripheral vascular disease, unspecified: Secondary | ICD-10-CM

## 2019-06-06 NOTE — Patient Instructions (Signed)
PLEASE READ AND FOLLOW SALTY 6 ATTACHED.  Follow-Up: IN 6 months  Either In Person or Virtual You may see Sanda Klein, MD, Coletta Memos, FNP or one of the following Advanced Practice Providers on your designated Care Team:  Almyra Deforest, PA-C  Fabian Sharp, PA-C or [employer], [place].    Medication Instructions:  The current medical regimen is effective;  continue present plan and medications as directed. Please refer to the Current Medication list given to you today. If you need a refill on your cardiac medications before your next appointment, please call your pharmacy.  At [facility], you and your health needs are our priority.  As part of our continuing mission to provide you with exceptional heart care, we have created designated Provider Care Teams.  These Care Teams include your primary Cardiologist (physician) and Advanced Practice Providers (APPs -  Physician Assistants and Nurse Practitioners) who all work together to provide you with the care you need, when you need it.  Thank you for choosing [facility] at [facility]!!

## 2019-06-30 ENCOUNTER — Other Ambulatory Visit: Payer: Self-pay

## 2019-06-30 NOTE — Patient Outreach (Signed)
Howard Jackson) Care Management  06/30/2019  Howard Jackson [birth year] [id number]   Telephone Screen  Referral Date: 06/30/2019 Referral Source: Nurse Line([place]) Referral Reason: " caller states he might have experienced chills-wants to know if he should get COVID test" Insurance: [facility]   Outreach attempt #1 to patient. No answer at present. RN CM left HIPPA compliant voicemail message along with contact info.    Plan: RN CM will make outreach attempt to patient within 3-4 business days. RN CM will send unsuccessful outreach letter to patient.  Howard Montgomery, RN,BSN,CCM [place] Management Telephonic Care Management Coordinator Direct Phone: [phone] Toll Free: [phone] Fax: [phone]

## 2019-07-01 ENCOUNTER — Other Ambulatory Visit: Payer: Self-pay

## 2019-07-01 NOTE — Patient Outreach (Signed)
[facility]) Care Management  07/01/2019  Howard Jackson Jan 06, 1946 [id number]   Telephone Screen  Referral Date: 06/30/2019 Referral Source: Nurse Line([place]) Referral Reason: " caller states he might have experienced chills-wants to know if he should get COVID test" Insurance: [facility]   Outreach attempt #2 to patient. Spoke with patient and discussed recent call to nurse call center. Patient rushing to end call and not wanting to answer questions. He voiced he was feeling fine and has not had any chills or other symptoms. He denies needing any further assistance and did not wish to complete screening assessment. RN CM advised patient to feel free to call 24 hr Nurse Line for any future questions or concerns. He voiced understanding and ended call.       Plan: RN CM will close case at this time.    Enzo Montgomery, RN,BSN,CCM [place] Management Telephonic Care Management Coordinator Direct Phone: [phone] Toll Free: [phone] Fax: [phone]

## 2019-07-06 DIAGNOSIS — I10 Essential (primary) hypertension: Secondary | ICD-10-CM | POA: Diagnosis not present

## 2019-07-06 DIAGNOSIS — M25552 Pain in left hip: Secondary | ICD-10-CM | POA: Diagnosis not present

## 2019-07-06 DIAGNOSIS — Z20828 Contact with and (suspected) exposure to other viral communicable diseases: Secondary | ICD-10-CM | POA: Diagnosis not present

## 2019-07-06 DIAGNOSIS — G8929 Other chronic pain: Secondary | ICD-10-CM | POA: Diagnosis not present

## 2019-07-06 DIAGNOSIS — Z993 Dependence on wheelchair: Secondary | ICD-10-CM | POA: Diagnosis not present

## 2019-07-12 ENCOUNTER — Other Ambulatory Visit: Payer: Self-pay | Admitting: Cardiovascular Disease

## 2019-07-12 DIAGNOSIS — E785 Hyperlipidemia, unspecified: Secondary | ICD-10-CM | POA: Diagnosis not present

## 2019-07-12 DIAGNOSIS — N401 Enlarged prostate with lower urinary tract symptoms: Secondary | ICD-10-CM | POA: Diagnosis not present

## 2019-07-12 DIAGNOSIS — I639 Cerebral infarction, unspecified: Secondary | ICD-10-CM | POA: Diagnosis not present

## 2019-07-12 DIAGNOSIS — I2510 Atherosclerotic heart disease of native coronary artery without angina pectoris: Secondary | ICD-10-CM | POA: Diagnosis not present

## 2019-07-12 DIAGNOSIS — I10 Essential (primary) hypertension: Secondary | ICD-10-CM | POA: Diagnosis not present

## 2019-07-12 DIAGNOSIS — J4520 Mild intermittent asthma, uncomplicated: Secondary | ICD-10-CM | POA: Diagnosis not present

## 2019-07-13 DIAGNOSIS — M25551 Pain in right hip: Secondary | ICD-10-CM | POA: Diagnosis not present

## 2019-07-13 DIAGNOSIS — M79605 Pain in left leg: Secondary | ICD-10-CM | POA: Diagnosis not present

## 2019-07-14 ENCOUNTER — Other Ambulatory Visit: Payer: Self-pay | Admitting: Cardiovascular Disease

## 2019-07-14 DIAGNOSIS — I152 Hypertension secondary to endocrine disorders: Secondary | ICD-10-CM

## 2019-07-19 DIAGNOSIS — N401 Enlarged prostate with lower urinary tract symptoms: Secondary | ICD-10-CM | POA: Diagnosis not present

## 2019-07-19 DIAGNOSIS — E785 Hyperlipidemia, unspecified: Secondary | ICD-10-CM | POA: Diagnosis not present

## 2019-07-19 DIAGNOSIS — I10 Essential (primary) hypertension: Secondary | ICD-10-CM | POA: Diagnosis not present

## 2019-07-19 DIAGNOSIS — J4520 Mild intermittent asthma, uncomplicated: Secondary | ICD-10-CM | POA: Diagnosis not present

## 2019-07-19 DIAGNOSIS — I2510 Atherosclerotic heart disease of native coronary artery without angina pectoris: Secondary | ICD-10-CM | POA: Diagnosis not present

## 2019-07-19 DIAGNOSIS — I639 Cerebral infarction, unspecified: Secondary | ICD-10-CM | POA: Diagnosis not present

## 2019-08-02 DIAGNOSIS — M25551 Pain in right hip: Secondary | ICD-10-CM | POA: Diagnosis not present

## 2019-08-02 DIAGNOSIS — M79662 Pain in left lower leg: Secondary | ICD-10-CM | POA: Diagnosis not present

## 2019-08-05 ENCOUNTER — Other Ambulatory Visit: Payer: Self-pay | Admitting: Cardiovascular Disease

## 2019-08-05 NOTE — Telephone Encounter (Signed)
Rx(s) sent to pharmacy electronically.  

## 2019-08-23 DIAGNOSIS — I69354 Hemiplegia and hemiparesis following cerebral infarction affecting left non-dominant side: Secondary | ICD-10-CM | POA: Diagnosis not present

## 2019-08-23 DIAGNOSIS — Z8673 Personal history of transient ischemic attack (TIA), and cerebral infarction without residual deficits: Secondary | ICD-10-CM | POA: Diagnosis not present

## 2019-08-23 DIAGNOSIS — Z981 Arthrodesis status: Secondary | ICD-10-CM | POA: Diagnosis not present

## 2019-08-23 DIAGNOSIS — G8194 Hemiplegia, unspecified affecting left nondominant side: Secondary | ICD-10-CM | POA: Diagnosis not present

## 2019-08-31 DIAGNOSIS — J4520 Mild intermittent asthma, uncomplicated: Secondary | ICD-10-CM | POA: Diagnosis not present

## 2019-08-31 DIAGNOSIS — E785 Hyperlipidemia, unspecified: Secondary | ICD-10-CM | POA: Diagnosis not present

## 2019-08-31 DIAGNOSIS — I2510 Atherosclerotic heart disease of native coronary artery without angina pectoris: Secondary | ICD-10-CM | POA: Diagnosis not present

## 2019-08-31 DIAGNOSIS — I10 Essential (primary) hypertension: Secondary | ICD-10-CM | POA: Diagnosis not present

## 2019-08-31 DIAGNOSIS — N401 Enlarged prostate with lower urinary tract symptoms: Secondary | ICD-10-CM | POA: Diagnosis not present

## 2019-08-31 DIAGNOSIS — I639 Cerebral infarction, unspecified: Secondary | ICD-10-CM | POA: Diagnosis not present

## 2019-09-08 DIAGNOSIS — G8929 Other chronic pain: Secondary | ICD-10-CM | POA: Diagnosis not present

## 2019-09-08 DIAGNOSIS — E2609 Other primary hyperaldosteronism: Secondary | ICD-10-CM | POA: Diagnosis not present

## 2019-09-08 DIAGNOSIS — D497 Neoplasm of unspecified behavior of endocrine glands and other parts of nervous system: Secondary | ICD-10-CM | POA: Diagnosis not present

## 2019-09-08 DIAGNOSIS — Z8781 Personal history of (healed) traumatic fracture: Secondary | ICD-10-CM | POA: Diagnosis not present

## 2019-09-08 DIAGNOSIS — I69354 Hemiplegia and hemiparesis following cerebral infarction affecting left non-dominant side: Secondary | ICD-10-CM | POA: Diagnosis not present

## 2019-09-08 DIAGNOSIS — N183 Chronic kidney disease, stage 3 unspecified: Secondary | ICD-10-CM | POA: Diagnosis not present

## 2019-09-08 DIAGNOSIS — I129 Hypertensive chronic kidney disease with stage 1 through stage 4 chronic kidney disease, or unspecified chronic kidney disease: Secondary | ICD-10-CM | POA: Diagnosis not present

## 2019-09-08 DIAGNOSIS — M25552 Pain in left hip: Secondary | ICD-10-CM | POA: Diagnosis not present

## 2019-09-27 ENCOUNTER — Other Ambulatory Visit: Payer: Self-pay | Admitting: Family Medicine

## 2019-09-27 DIAGNOSIS — M545 Low back pain, unspecified: Secondary | ICD-10-CM

## 2019-09-27 DIAGNOSIS — R197 Diarrhea, unspecified: Secondary | ICD-10-CM | POA: Diagnosis not present

## 2019-09-27 DIAGNOSIS — Z03818 Encounter for observation for suspected exposure to other biological agents ruled out: Secondary | ICD-10-CM | POA: Diagnosis not present

## 2019-10-07 NOTE — Telephone Encounter (Signed)
error 

## 2019-10-10 DIAGNOSIS — I2510 Atherosclerotic heart disease of native coronary artery without angina pectoris: Secondary | ICD-10-CM | POA: Diagnosis not present

## 2019-10-10 DIAGNOSIS — J4520 Mild intermittent asthma, uncomplicated: Secondary | ICD-10-CM | POA: Diagnosis not present

## 2019-10-10 DIAGNOSIS — N401 Enlarged prostate with lower urinary tract symptoms: Secondary | ICD-10-CM | POA: Diagnosis not present

## 2019-10-10 DIAGNOSIS — E785 Hyperlipidemia, unspecified: Secondary | ICD-10-CM | POA: Diagnosis not present

## 2019-10-10 DIAGNOSIS — I639 Cerebral infarction, unspecified: Secondary | ICD-10-CM | POA: Diagnosis not present

## 2019-10-10 DIAGNOSIS — I10 Essential (primary) hypertension: Secondary | ICD-10-CM | POA: Diagnosis not present

## 2019-10-20 DIAGNOSIS — M47816 Spondylosis without myelopathy or radiculopathy, lumbar region: Secondary | ICD-10-CM | POA: Diagnosis not present

## 2019-10-20 DIAGNOSIS — M5126 Other intervertebral disc displacement, lumbar region: Secondary | ICD-10-CM | POA: Diagnosis not present

## 2019-10-20 DIAGNOSIS — M5386 Other specified dorsopathies, lumbar region: Secondary | ICD-10-CM | POA: Diagnosis not present

## 2019-10-28 DIAGNOSIS — I10 Essential (primary) hypertension: Secondary | ICD-10-CM | POA: Diagnosis not present

## 2019-11-03 DIAGNOSIS — M48061 Spinal stenosis, lumbar region without neurogenic claudication: Secondary | ICD-10-CM | POA: Diagnosis not present

## 2019-11-03 DIAGNOSIS — M5126 Other intervertebral disc displacement, lumbar region: Secondary | ICD-10-CM | POA: Diagnosis not present

## 2019-11-03 DIAGNOSIS — M47816 Spondylosis without myelopathy or radiculopathy, lumbar region: Secondary | ICD-10-CM | POA: Diagnosis not present

## 2019-11-03 DIAGNOSIS — M4800 Spinal stenosis, site unspecified: Secondary | ICD-10-CM | POA: Diagnosis not present

## 2019-11-03 DIAGNOSIS — K219 Gastro-esophageal reflux disease without esophagitis: Secondary | ICD-10-CM | POA: Diagnosis not present

## 2019-11-10 DIAGNOSIS — E279 Disorder of adrenal gland, unspecified: Secondary | ICD-10-CM | POA: Diagnosis not present

## 2019-11-10 DIAGNOSIS — R42 Dizziness and giddiness: Secondary | ICD-10-CM | POA: Diagnosis not present

## 2019-11-10 DIAGNOSIS — Z5181 Encounter for therapeutic drug level monitoring: Secondary | ICD-10-CM | POA: Diagnosis not present

## 2019-11-10 DIAGNOSIS — H9190 Unspecified hearing loss, unspecified ear: Secondary | ICD-10-CM | POA: Diagnosis not present

## 2019-11-10 DIAGNOSIS — E2609 Other primary hyperaldosteronism: Secondary | ICD-10-CM | POA: Diagnosis not present

## 2019-11-10 DIAGNOSIS — H9319 Tinnitus, unspecified ear: Secondary | ICD-10-CM | POA: Diagnosis not present

## 2019-11-10 DIAGNOSIS — I10 Essential (primary) hypertension: Secondary | ICD-10-CM | POA: Diagnosis not present

## 2019-11-11 DIAGNOSIS — M47816 Spondylosis without myelopathy or radiculopathy, lumbar region: Secondary | ICD-10-CM | POA: Diagnosis not present

## 2019-11-11 DIAGNOSIS — I639 Cerebral infarction, unspecified: Secondary | ICD-10-CM | POA: Diagnosis not present

## 2019-11-11 DIAGNOSIS — E785 Hyperlipidemia, unspecified: Secondary | ICD-10-CM | POA: Diagnosis not present

## 2019-11-11 DIAGNOSIS — N401 Enlarged prostate with lower urinary tract symptoms: Secondary | ICD-10-CM | POA: Diagnosis not present

## 2019-11-11 DIAGNOSIS — I2510 Atherosclerotic heart disease of native coronary artery without angina pectoris: Secondary | ICD-10-CM | POA: Diagnosis not present

## 2019-11-11 DIAGNOSIS — I10 Essential (primary) hypertension: Secondary | ICD-10-CM | POA: Diagnosis not present

## 2019-11-11 DIAGNOSIS — J4520 Mild intermittent asthma, uncomplicated: Secondary | ICD-10-CM | POA: Diagnosis not present

## 2019-12-08 DIAGNOSIS — I10 Essential (primary) hypertension: Secondary | ICD-10-CM | POA: Diagnosis not present

## 2019-12-08 DIAGNOSIS — M47816 Spondylosis without myelopathy or radiculopathy, lumbar region: Secondary | ICD-10-CM | POA: Diagnosis not present

## 2019-12-08 DIAGNOSIS — J4520 Mild intermittent asthma, uncomplicated: Secondary | ICD-10-CM | POA: Diagnosis not present

## 2019-12-08 DIAGNOSIS — N401 Enlarged prostate with lower urinary tract symptoms: Secondary | ICD-10-CM | POA: Diagnosis not present

## 2019-12-08 DIAGNOSIS — E785 Hyperlipidemia, unspecified: Secondary | ICD-10-CM | POA: Diagnosis not present

## 2019-12-08 DIAGNOSIS — I639 Cerebral infarction, unspecified: Secondary | ICD-10-CM | POA: Diagnosis not present

## 2019-12-08 DIAGNOSIS — I2510 Atherosclerotic heart disease of native coronary artery without angina pectoris: Secondary | ICD-10-CM | POA: Diagnosis not present

## 2019-12-16 ENCOUNTER — Telehealth: Payer: Self-pay

## 2019-12-16 ENCOUNTER — Encounter: Payer: Self-pay | Admitting: General Practice

## 2019-12-16 ENCOUNTER — Telehealth: Payer: Self-pay | Admitting: Cardiovascular Disease

## 2019-12-16 ENCOUNTER — Ambulatory Visit (INDEPENDENT_AMBULATORY_CARE_PROVIDER_SITE_OTHER): Payer: Medicare HMO | Admitting: General Practice

## 2019-12-16 ENCOUNTER — Other Ambulatory Visit: Payer: Self-pay

## 2019-12-16 VITALS — BP 138/62 | HR 70 | Temp 98.0°F | Ht 67.0 in | Wt 161.0 lb

## 2019-12-16 DIAGNOSIS — I499 Cardiac arrhythmia, unspecified: Secondary | ICD-10-CM

## 2019-12-16 DIAGNOSIS — M7989 Other specified soft tissue disorders: Secondary | ICD-10-CM

## 2019-12-16 DIAGNOSIS — I739 Peripheral vascular disease, unspecified: Secondary | ICD-10-CM

## 2019-12-16 DIAGNOSIS — I10 Essential (primary) hypertension: Secondary | ICD-10-CM | POA: Diagnosis not present

## 2019-12-16 DIAGNOSIS — I2510 Atherosclerotic heart disease of native coronary artery without angina pectoris: Secondary | ICD-10-CM

## 2019-12-16 NOTE — Patient Instructions (Signed)
Medication Instructions:  Your physician recommends that you continue on your current medications as directed. Please refer to the Current Medication list given to you today.  *If you need a refill on your cardiac medications before your next appointment, please call your pharmacy*   Lab Work: BMET, CBC today  If you have labs (blood work) drawn today and your tests are completely normal, you will receive your results only by: Marland Kitchen MyChart Message (if you have MyChart) OR . A paper copy in the mail If you have any lab test that is abnormal or we need to change your treatment, we will call you to review the results.   Testing/Procedures:  [facility] [place]- Long Term Monitor Instructions   Your physician has requested you wear your ZIO patch monitor 7 days.   This is a single patch monitor.  [employer] supplies one patch monitor per enrollment.  Additional stickers are not available.   Please do not apply patch if you will be having a Nuclear Stress Test, Echocardiogram, Cardiac CT, MRI, or Chest Xray during the time frame you would be wearing the monitor. The patch cannot be worn during these tests.  You cannot remove and re-apply the ZIO XT patch monitor.   Your ZIO patch monitor will be sent USPS Priority mail from [facility] directly to your home address. The monitor may also be mailed to a PO BOX if home delivery is not available.   It may take 3-5 days to receive your monitor after you have been enrolled.   Once you have received you monitor, please review enclosed instructions.  Your monitor has already been registered assigning a specific monitor serial # to you.   Applying the monitor   Shave hair from upper left chest.   Hold abrader disc by orange tab.  Rub abrader in 40 strokes over left upper chest as indicated in your monitor instructions.   Clean area with 4 enclosed alcohol pads .  Use all pads to assure are is cleaned thoroughly.  Let dry.   Apply patch as indicated in  monitor instructions.  Patch will be place under collarbone on left side of chest with arrow pointing upward.   Rub patch adhesive wings for 2 minutes.Remove white label marked "1".  Remove white label marked "2".  Rub patch adhesive wings for 2 additional minutes.   While looking in a mirror, press and release button in center of patch.  A small green light will flash 3-4 times .  This will be your only indicator the monitor has been turned on.     Do not shower for the first 24 hours.  You may shower after the first 24 hours.   Press button if you feel a symptom. You will hear a small click.  Record Date, Time and Symptom in the Patient Log Book.   When you are ready to remove patch, follow instructions on last 2 pages of Patient Log Book.  Stick patch monitor onto last page of Patient Log Book.   Place Patient Log Book in [place] box.  Use locking tab on box and tape box closed securely.  The [employer] and [employer] has [employer] on it.  Please place in mailbox as soon as possible.  Your physician should have your test results approximately 7 days after the monitor has been mailed back to [facility].   Call [place] at [phone] if you have questions regarding your ZIO XT patch monitor.  Call them  immediately if you see an orange light blinking on your monitor.   If your monitor falls off in less than 4 days contact our Monitor department at [phone].  If your monitor becomes loose or falls off after 4 days call [employer] at [phone] for suggestions on securing your monitor.     Follow-Up: At [facility], you and your health needs are our priority.  As part of our continuing mission to provide you with exceptional heart care, we have created designated Provider Care Teams.  These Care Teams include your primary Cardiologist (physician) and Advanced Practice Providers (APPs -  Physician Assistants and Nurse Practitioners) who all work together to provide  you with the care you need, when you need it.  We recommend signing up for the patient portal called "MyChart".  Sign up information is provided on this After Visit Summary.  MyChart is used to connect with patients for Virtual Visits (Telemedicine).  Patients are able to view lab/test results, encounter notes, upcoming appointments, etc.  Non-urgent messages can be sent to your provider as well.   To learn more about what you can do with MyChart, go to [email or web address].    Your next appointment:   6-8 week(s)  The format for your next appointment:   In Person  Provider:   You may see Sanda Klein, MD or one of the following Advanced Practice Providers on your designated Care Team:    Coletta Memos NP   Other Instructions Monitor your blood pressure daily (or if you start feeling bad), write it down .  Bring the log to your next appointment.

## 2019-12-16 NOTE — Telephone Encounter (Signed)
Spoke with patient. Patient had an episode of chest pain last night that resolved on it's own. Patient reports his blood pressure monitor told him his HR is irregular. Last night BP 106/57, HR 69 so he drank coffee and ate some salt to increase his blood pressure. Today BP 130/57, HR 49. He has mild shortness of breath and he is fatigued. He requests an in office visit. Patient is not actively having chest pain at the time of the call. Patient placed on schedule for 2:45pm today with Coletta Memos, NP. If he develops CP or worsened shortness of breath he should report to the nearest emergency room for evaluation.

## 2019-12-16 NOTE — [place] Notes (Signed)
Thanks

## 2019-12-16 NOTE — Progress Notes (Signed)
Cardiology Clinic Note   Patient Name: Howard Jackson Date of Encounter: 12/16/2019  Primary Care Provider:  Kristen Loader, FNP Primary Cardiologist:  Sanda Klein, MD  Patient Profile    Leanord Jackson 74 year old male presents today for an evaluation of his irregular heart rate.  Past Medical History    Past Medical History:  Diagnosis Date  . Adrenal gland disorder ([place])    tumor present - no change-  no recent increase    . Arthritis   . Benign pheochromocytoma of right adrenal gland 06/23/2014   Never had histological diagnosis, but reportedly initially diagnosed in 1989  . Bunion    left foot  . Complication of anesthesia    "died on the table during cervical fusion" '89 - believed to be d/t "too much medication" to treat HTN and was later dx with pheochromocytoma  . Coronary artery disease    cleared for surg. by Dr. Gwenlyn Found - [facility]  . H/O hiatal hernia   . Hammer toe   . Herniated lumbar intervertebral disc   . History of anxiety   . Hyperlipidemia   . Hypertension   . Neurogenic bladder   . Neuromuscular disorder ([place])    carpal tunnel problem, even after surgery release   . Peripheral arterial disease ([place])   . Primary aldosteronism ([place])   . Stroke ([place])    in [place], L sided weakness, wears a brace on L leg & uses cane   Past Surgical History:  Procedure Laterality Date  . ANTERIOR APPROACH HEMI HIP ARTHROPLASTY Left 04/15/2018   Procedure: ANTERIOR APPROACH HEMI HIP ARTHROPLASTY;  Surgeon: Rod Can, MD;  Location: [facility];  Service: Orthopedics;  Laterality: Left;  . CARPAL TUNNEL RELEASE     bilateral   . CERVICAL FUSION    . CORONARY ANGIOPLASTY WITH STENT PLACEMENT     pt reports having 5 stents in his heart-2011  . HERNIA REPAIR    . INGUINAL HERNIA REPAIR Left 11/18/2012   Procedure: HERNIA REPAIR INGUINAL ADULT;  Surgeon: Harl Bowie, MD;  Location: [facility];  Service: General;  Laterality: Left;  . INSERTION OF MESH Left  11/18/2012   Procedure: INSERTION OF MESH;  Surgeon: Harl Bowie, MD;  Location: [facility];  Service: General;  Laterality: Left;    Allergies  Allergies  Allergen Reactions  . Elavil [Amitriptyline Hcl] Hypertension  . Sulfa Antibiotics Other (See Comments)    Constipation  Other reaction(s): Other (See Comments) Constipation     History of Present Illness    [name] is past medical history of PAD,  CAD, renal artery stenosis, carotid stenosis, asthma, dyslipidemia, and close displaced fracture of the left femoral neck. 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  His last PCI was 10/2009.  He was recently granted surgical clearance for platelet rich plasma procedure.  He was last seen by Dr. Loletha Grayer on 11/09/2018.  During that time he was having labile blood pressures and his nifedipine XL was reduced to 60 mg at bedtime and his 30 mg dose in the a.m. was discontinued.  He was also having some ankle swelling.  With the discontinuation of the nifedipine in the a.m. the hope was that this would reduce the swelling as well.  He is on lifelong clopidogrel due to his sequela of CVA.  Carotid Dopplers on 07/2018 showed no evidence of significant obstruction right/left coronary arteries.  He presented to the clinic 06/06/19 and stated he  had been doing well.   He did not have his shoulder PRP injection. He stated that the injection was going to be very costly and he did not know how much benefit it would be.  He stated he continued to have some lower extremity swelling. However, he noticed that if he elevated his lower extremities at night swelling was gone morning.  He continued to try to eat a low-sodium diet.  He was sedentary due to his left-sided hemiparesis.  Contacted nurse triage line today 12/16/2019 and stated that he had an episode of chest pain last night that resolved on its own.  He indicated that his blood pressure monitor showed an alert for irregular heart  rate.  His blood pressure last night was 106/57 with a heart rate of 69.  He told the triage nurse that he drank some coffee and ate some salt to increase his blood pressure.  Her blood pressure on recheck was 130/57 with a heart rate of 49.  He stated he had mild shortness of breath and was somewhat fatigued.  He presents the clinic today for follow-up evaluation and states he had one episode of chest pain last night that lasted for about 30 minutes.  His discomfort was relieved with rest.  It was nonradiating and not associated with activity.  He was taking his blood pressure this morning and noted an irregular heartbeat alert on his monitor.  He states during that time he had chills but did not notice racing of his heart.  He states that he drank some coffee and felt better.  Also states he has been limiting his fluids due to frequent urination.  He states he has an appointment with his PCP in 2 days for evaluation of a cough that has been present for several days.  Clear to auscultation today.  I will order a 7-day event monitor for further evaluation, CBC, BMP, give him a blood pressure log and have him return in 6 to 8 weeks for further evaluation.  Today he denies chest pain, shortness of breath, lower extremity edema, fatigue, palpitations, melena, hematuria, hemoptysis, diaphoresis, weakness, presyncope, syncope, orthopnea, and PND.   Home Medications    Prior to Admission medications   Medication Sig Start Date End Date Taking? Authorizing Provider  aspirin 325 MG tablet Take 650 mg by mouth once.    [provider]  clopidogrel (PLAVIX) 75 MG tablet TOME UNA TABLETA TODOS LOS DIAS 04/29/19   Croitoru, Mihai, MD  finasteride (PROSCAR) 5 MG tablet Take 5 mg by mouth daily. 01/19/18   [provider]  FLOVENT HFA 110 MCG/ACT inhaler Inhale 1 puff into the lungs 3 (three) times daily.  04/13/14   [provider]  Glucosamine-Chondroit-Vit C-Mn (FLEX-DS PO) Take 2 tablets  by mouth every evening.     [provider]  labetalol (NORMODYNE) 300 MG tablet TAKE 1 TABLET (300 MG TOTAL) BY MOUTH 2 (TWO) TIMES DAILY. 07/14/19   Croitoru, Mihai, MD  lactobacillus acidophilus (BACID) TABS tablet Take 2 tablets by mouth 2 (two) times daily. 04/20/18   Alma Friendly, MD  mupirocin ointment (BACTROBAN) 2 %  05/26/19   [provider]  NIFEdipine (ADALAT CC) 30 MG 24 hr tablet TAKE 1 TABLET BY MOUTH EVERY EVENING (USE OTHER RX 60MG  IN MORNING ONLY) 08/05/19   Croitoru, Mihai, MD  NIFEdipine (ADALAT CC) 60 MG 24 hr tablet TAKE 1 TABLET (60 MG TOTAL) BY MOUTH EVERY EVENING. (USE OTHER 30 MG RX IN  THE MORNING ONLY) 04/29/19   Croitoru, Mihai, MD  senna-docusate (SENOKOT-S) 8.6-50 MG tablet Take 3 tablets by mouth every evening. Patient taking differently: Take 1 tablet by mouth at bedtime as needed for mild constipation.  04/20/18   Alma Friendly, MD  simvastatin (ZOCOR) 40 MG tablet Take 1 tablet (40 mg total) by mouth every evening. 08/25/16   Dorena Dew, FNP  spironolactone (ALDACTONE) 100 MG tablet Take 1 tablet (100 mg total) by mouth daily. 04/21/18   Alma Friendly, MD  tamsulosin (FLOMAX) 0.4 MG CAPS capsule Take 1 capsule (0.4 mg total) by mouth daily. Patient taking differently: Take 0.4 mg by mouth 2 (two) times daily.  01/09/17   Dorena Dew, FNP  tiZANidine (ZANAFLEX) 4 MG tablet TAKE 2 TABLETS BY MOUTH AT BEDTIME AS NEEDED 03/16/19   [provider]    Family History    Family History  Problem Relation Age of Onset  . CVA Father   . Diabetes Father   . Alzheimer's disease Father   . Hypertension Sister   . Hypertension Brother   . CVA Paternal Grandmother   . Hypertension Brother   . Hypertension Mother    He indicated that the status of his mother is unknown. He indicated that his father is deceased. He indicated that his sister is alive. He indicated that both of his brothers are alive. He indicated that  his maternal grandmother is deceased. He indicated that his maternal grandfather is deceased. He indicated that his paternal grandmother is deceased. He indicated that his paternal grandfather is deceased.  Social History    Social History   Socioeconomic History  . Marital status: Legally Separated    Spouse name: Not on file  . Number of children: Not on file  . Years of education: Not on file  . Highest education level: Not on file  Occupational History  . Not on file  Tobacco Use  . Smoking status: Never Smoker  . Smokeless tobacco: Never Used  Substance and Sexual Activity  . Alcohol use: Yes    Comment: social - one drink, on special occasion   . Drug use: No  . Sexual activity: Not on file  Other Topics Concern  . Not on file  Social History Narrative  . Not on file   Social Determinants of Health   Financial Resource Strain:   . Difficulty of Paying Living Expenses:   Food Insecurity:   . Worried About Charity fundraiser in the Last Year:   . Arboriculturist in the Last Year:   Transportation Needs:   . Film/video editor (Medical):   Marland Kitchen Lack of Transportation (Non-Medical):   Physical Activity:   . Days of Exercise per Week:   . Minutes of Exercise per Session:   Stress:   . Feeling of Stress :   Social Connections:   . Frequency of Communication with Friends and Family:   . Frequency of Social Gatherings with Friends and Family:   . Attends Religious Services:   . Active Member of Clubs or Organizations:   . Attends Archivist Meetings:   Marland Kitchen Marital Status:   Intimate Partner Violence:   . Fear of Current or Ex-Partner:   . Emotionally Abused:   Marland Kitchen Physically Abused:   . Sexually Abused:      Review of Systems    General:  No chills, fever, night sweats or weight changes.  Cardiovascular:  No chest  pain, dyspnea on exertion, edema, orthopnea, palpitations, paroxysmal nocturnal dyspnea. Dermatological: No rash,  lesions/masses Respiratory: No cough, dyspnea Urologic: No hematuria, dysuria Abdominal:   No nausea, vomiting, diarrhea, bright red blood per rectum, melena, or hematemesis Neurologic:  No visual changes, wkns, changes in mental status. All other systems reviewed and are otherwise negative except as noted above.  Physical Exam    VS:  BP 138/62   Pulse 70   Temp 98 F (36.7 C)   Ht 5\' 7"  (1.702 m)   Wt 161 lb (73 kg)   SpO2 98%   BMI 25.22 kg/m  , BMI Body mass index is 25.22 kg/m. GEN: Well nourished, well developed, in no acute distress. HEENT: normal. Neck: Supple, no JVD, carotid bruits, or masses. Cardiac: RRR, no murmurs, rubs, or gallops. No clubbing, cyanosis, edema.  Radials/DP/PT 2+ and equal bilaterally.  Respiratory:  Respirations regular and unlabored, clear to auscultation bilaterally. GI: Soft, nontender, nondistended, BS + x 4. MS: no deformity or atrophy. Skin: warm and dry, no rash. Neuro:  Strength and sensation are intact. Psych: Normal affect.  Accessory Clinical Findings    ECG personally reviewed by me today-normal sinus rhythm left axis deviation 70 bpm  EKG 06/06/2019 sinus rhythm with premature atrial complexes 67 bpm- No acute changes  EKG 05/15/2018 Normal sinus rhythm 65 bpm  Myocardial perfusion study 10/14/2012 Impression Exercise Capacity: Lexiscan with no exercise. BP Response: Normal blood pressure response. Clinical Symptoms: No significant symptoms noted. ECG Impression: No significant ST segment change suggestive of ischemia. Comparison with Prior Nuclear Study: No significant change from previous study  Overall Impression: Normal stress nuclear study.   Assessment & Plan   1.  Irregular heart rate-EKG today shows normal sinus rhythm left axis deviation 70 bpm.  Cardiac unaware.  Blood pressure machine indicated irregular heartbeat. Avoid triggers caffeine, chocolate, EtOH etc. Heart healthy low-sodium diet Increase  physical activity as tolerated Order 7-day ZIO monitor Order CBC and BMP  Coronary artery disease-no chest pain today.   Continue clopidogrel 75 mg tablet daily Continue aspirin Continue simvastatin 40 mg tablet daily Heart healthy low-sodium high-fiber diet Increase physical activity as tolerated  Lower extremity edema-left  ankle +1 pitting. Elevate lower extremities when not active Heart healthy low-sodium diet-salty 6 given Elevate lower extremities when not active.  Peripheral arterial disease-no complaints of claudication Continue to monitor  Hypertension-BP today  138/62. Continue labetalol 300 mg twice daily Continue 60 mg tablet every evening Continue spironolactone 100 mg tablet daily Hyperlipidemia-LDL 77 05/22/2016 Continue simvastatin 40 mg at bedtime Heart healthy low-sodium diet-salty 6 given  Disposition: Follow-up with me in 6-8 weeks.   Jossie Ng. Nahomy Limburg NP-C    12/16/2019, 2:45 PM [facility] [address] Office [phone] Fax [phone]

## 2019-12-16 NOTE — Telephone Encounter (Signed)
Called pt to go over monitor instructions. Verified address. 7 day Zio ordered to be mailed to pt.

## 2019-12-16 NOTE — Telephone Encounter (Signed)
Pt was having some sx that he wanted to discuss with Dr. Loletha Grayer Or the APP. His first available appt was not until July.  He would like to speak with a nurse to describe his symptoms and see what he needs to do. If he does not pick up please call his daughter Nasean Zapf  [phone].

## 2019-12-17 LAB — BASIC METABOLIC PANEL
BUN/Creatinine Ratio: 15 (ref 10–24)
BUN: 19 mg/dL (ref 8–27)
CO2: 22 mmol/L (ref 20–29)
Calcium: 9.6 mg/dL (ref 8.6–10.2)
Chloride: 98 mmol/L (ref 96–106)
Creatinine, Ser: 1.25 mg/dL (ref 0.76–1.27)
GFR calc Af Amer: 64 mL/min/{1.73_m2} (ref >59)
GFR calc non Af Amer: 56 mL/min/{1.73_m2} — ABNORMAL LOW (ref >59)
Glucose: 93 mg/dL (ref 65–99)
Potassium: 4.6 mmol/L (ref 3.5–5.2)
Sodium: 136 mmol/L (ref 134–144)

## 2019-12-17 LAB — CBC
Hematocrit: 44.6 % (ref 37.5–51.0)
Hemoglobin: 15.5 g/dL (ref 13.0–17.7)
MCH: 33.3 pg — ABNORMAL HIGH (ref 26.6–33.0)
MCHC: 34.8 g/dL (ref 31.5–35.7)
MCV: 96 fL (ref 79–97)
Platelets: 193 10*3/uL (ref 150–450)
RBC: 4.65 x10E6/uL (ref 4.14–5.80)
RDW: 12.7 % (ref 11.6–15.4)
WBC: 5.5 10*3/uL (ref 3.4–10.8)

## 2019-12-21 ENCOUNTER — Other Ambulatory Visit (INDEPENDENT_AMBULATORY_CARE_PROVIDER_SITE_OTHER): Payer: Medicare HMO

## 2019-12-21 DIAGNOSIS — M4180 Other forms of scoliosis, site unspecified: Secondary | ICD-10-CM | POA: Diagnosis not present

## 2019-12-21 DIAGNOSIS — I499 Cardiac arrhythmia, unspecified: Secondary | ICD-10-CM | POA: Diagnosis not present

## 2019-12-21 DIAGNOSIS — M48062 Spinal stenosis, lumbar region with neurogenic claudication: Secondary | ICD-10-CM | POA: Diagnosis not present

## 2019-12-22 ENCOUNTER — Telehealth: Payer: Self-pay

## 2019-12-22 NOTE — Telephone Encounter (Signed)
Patient returning call.

## 2019-12-22 NOTE — Telephone Encounter (Signed)
Primary Cardiologist:Mihai Croitoru, MD  Chart reviewed as part of pre-operative protocol coverage. Because of Howard Jackson past medical history and time since last visit, he/she will require a follow-up visit in order to better assess preoperative cardiovascular risk.  Patient undergoing evaluation for palpitations.  Needs result from 7-day ZIO monitor prior to cardiac clearance.  Pre-op covering staff: - Please schedule appointment and call patient to inform them. - Please contact requesting surgeon's office via preferred method (i.e, phone, fax) to inform them of need for appointment prior to surgery.  If applicable, this message will also be routed to pharmacy pool and/or primary cardiologist for input on holding anticoagulant/antiplatelet agent as requested below so that this information is available at time of patient's appointment.   Howard Jackson. Howard Hanselman NP-C    12/22/2019, 10:50 AM [address] Office [phone] Fax [phone]

## 2019-12-22 NOTE — Telephone Encounter (Signed)
Forwarded to requesting providers office via [facility] fax function to update on status of clearance.  Left detailed message informing pt of status of cardiac clearance.  Will await monitor result

## 2019-12-22 NOTE — Telephone Encounter (Signed)
   [facility] Pre-operative Risk Assessment    HEARTCARE STAFF: - Please ensure there is not already an duplicate clearance open for this procedure. - Under Visit Info/Reason for Call, type in Other and utilize the format Clearance MM/DD/YY or Clearance TBD. Do not use dashes or single digits. - If request is for dental extraction, please clarify the # of teeth to be extracted.  Request for surgical clearance:  1. What type of surgery is being performed? L4-5 TRANSLAMINAR EPIDURAL STEROID INJECTION   2. When is this surgery scheduled? TBD   3. What type of clearance is required (medical clearance vs. Pharmacy clearance to hold med vs. Both)? BOTH  4. Are there any medications that need to be held prior to surgery and how long? PLAVIX   5. Practice name and name of physician performing surgery? [facility]  ATTN:JESSICA   6. What is the office phone number? [phone]   7.   What is the office fax number? [phone]  8.   Anesthesia type (None, local, MAC, general) ? IV SEDATION

## 2019-12-23 ENCOUNTER — Telehealth: Payer: Self-pay | Admitting: General Practice

## 2019-12-23 NOTE — Telephone Encounter (Signed)
Howard Jackson is calling back stating the company advised them they do not have a hypoallergenic alternative. He states he will wear it as long as he can, but would like to know what other options he has if he is unable to wear this monitor as long as needed. Please advise.

## 2019-12-23 NOTE — Telephone Encounter (Signed)
Left message for pt to call.

## 2019-12-23 NOTE — Telephone Encounter (Signed)
New message   Patient states that he is having issues with heart monitor he is itching and he feels like the monitor is pinching him. Please call to discuss.

## 2019-12-23 NOTE — Telephone Encounter (Signed)
Patient returning call.

## 2019-12-23 NOTE — Telephone Encounter (Signed)
Spoke with pt, aware to try mylanta or maalox under the new patch.

## 2019-12-23 NOTE — Telephone Encounter (Signed)
Pt called to report that he has a rash and severe itching under his Zio patch.. he has used "anti-itch" cream on the parts he can but it is still bothering him under the monitor. I asked him to call Zio for advice and I will forward to your monitor nurses for review. Pt just placed it on 12/21/19 and was ordered for 7 days.

## 2019-12-26 DIAGNOSIS — R42 Dizziness and giddiness: Secondary | ICD-10-CM | POA: Diagnosis not present

## 2019-12-26 DIAGNOSIS — H6123 Impacted cerumen, bilateral: Secondary | ICD-10-CM | POA: Diagnosis not present

## 2019-12-26 DIAGNOSIS — Z8673 Personal history of transient ischemic attack (TIA), and cerebral infarction without residual deficits: Secondary | ICD-10-CM | POA: Diagnosis not present

## 2019-12-27 ENCOUNTER — Telehealth: Payer: Self-pay | Admitting: Cardiovascular Disease

## 2019-12-27 NOTE — Telephone Encounter (Signed)
The patient wanted to know how long he needs to wear his heart monitor. He said to complete the 7 days he would take it off at 9:00 pm.  Please call the patient to confirm orders

## 2019-12-27 NOTE — Telephone Encounter (Signed)
Returned call to pt he states that he will be mailing back monitor tomorrow. He has appt with [facility] 01-04-20 to review. Hopefully the results will be available

## 2019-12-27 NOTE — Telephone Encounter (Signed)
Called and spoke with pt, difficult to understand him over the phone. Notified that he is supposed to wear the monitor for 7 days and then he can remove it and send it back. Pt states he will mail it back on Thursday. Notified this was fine, pt verbalized understanding with no other questions at this time.

## 2019-12-29 DIAGNOSIS — H906 Mixed conductive and sensorineural hearing loss, bilateral: Secondary | ICD-10-CM | POA: Diagnosis not present

## 2019-12-29 DIAGNOSIS — R42 Dizziness and giddiness: Secondary | ICD-10-CM | POA: Diagnosis not present

## 2020-01-04 DIAGNOSIS — I2510 Atherosclerotic heart disease of native coronary artery without angina pectoris: Secondary | ICD-10-CM | POA: Diagnosis not present

## 2020-01-04 DIAGNOSIS — I639 Cerebral infarction, unspecified: Secondary | ICD-10-CM | POA: Diagnosis not present

## 2020-01-04 DIAGNOSIS — I10 Essential (primary) hypertension: Secondary | ICD-10-CM | POA: Diagnosis not present

## 2020-01-04 DIAGNOSIS — J4520 Mild intermittent asthma, uncomplicated: Secondary | ICD-10-CM | POA: Diagnosis not present

## 2020-01-04 DIAGNOSIS — N401 Enlarged prostate with lower urinary tract symptoms: Secondary | ICD-10-CM | POA: Diagnosis not present

## 2020-01-04 DIAGNOSIS — E785 Hyperlipidemia, unspecified: Secondary | ICD-10-CM | POA: Diagnosis not present

## 2020-01-04 DIAGNOSIS — M47816 Spondylosis without myelopathy or radiculopathy, lumbar region: Secondary | ICD-10-CM | POA: Diagnosis not present

## 2020-01-19 DIAGNOSIS — I499 Cardiac arrhythmia, unspecified: Secondary | ICD-10-CM | POA: Diagnosis not present

## 2020-01-23 DIAGNOSIS — D497 Neoplasm of unspecified behavior of endocrine glands and other parts of nervous system: Secondary | ICD-10-CM | POA: Diagnosis not present

## 2020-01-23 DIAGNOSIS — E2609 Other primary hyperaldosteronism: Secondary | ICD-10-CM | POA: Diagnosis not present

## 2020-01-23 DIAGNOSIS — Z6825 Body mass index (BMI) 25.0-25.9, adult: Secondary | ICD-10-CM | POA: Diagnosis not present

## 2020-01-27 DIAGNOSIS — M47816 Spondylosis without myelopathy or radiculopathy, lumbar region: Secondary | ICD-10-CM | POA: Diagnosis not present

## 2020-01-27 DIAGNOSIS — I10 Essential (primary) hypertension: Secondary | ICD-10-CM | POA: Diagnosis not present

## 2020-01-27 DIAGNOSIS — I2510 Atherosclerotic heart disease of native coronary artery without angina pectoris: Secondary | ICD-10-CM | POA: Diagnosis not present

## 2020-01-27 DIAGNOSIS — E785 Hyperlipidemia, unspecified: Secondary | ICD-10-CM | POA: Diagnosis not present

## 2020-01-27 DIAGNOSIS — N401 Enlarged prostate with lower urinary tract symptoms: Secondary | ICD-10-CM | POA: Diagnosis not present

## 2020-01-27 DIAGNOSIS — J4520 Mild intermittent asthma, uncomplicated: Secondary | ICD-10-CM | POA: Diagnosis not present

## 2020-01-27 DIAGNOSIS — I639 Cerebral infarction, unspecified: Secondary | ICD-10-CM | POA: Diagnosis not present

## 2020-02-03 ENCOUNTER — Other Ambulatory Visit: Payer: Self-pay

## 2020-02-03 ENCOUNTER — Encounter: Payer: Self-pay | Admitting: Cardiovascular Disease

## 2020-02-03 ENCOUNTER — Ambulatory Visit (INDEPENDENT_AMBULATORY_CARE_PROVIDER_SITE_OTHER): Payer: Medicare HMO | Admitting: Cardiovascular Disease

## 2020-02-03 VITALS — BP 128/69 | HR 73 | Ht 67.0 in | Wt 154.0 lb

## 2020-02-03 DIAGNOSIS — G8114 Spastic hemiplegia affecting left nondominant side: Secondary | ICD-10-CM | POA: Diagnosis not present

## 2020-02-03 DIAGNOSIS — E7800 Pure hypercholesterolemia, unspecified: Secondary | ICD-10-CM | POA: Diagnosis not present

## 2020-02-03 DIAGNOSIS — I10 Essential (primary) hypertension: Secondary | ICD-10-CM

## 2020-02-03 DIAGNOSIS — I739 Peripheral vascular disease, unspecified: Secondary | ICD-10-CM

## 2020-02-03 DIAGNOSIS — I2510 Atherosclerotic heart disease of native coronary artery without angina pectoris: Secondary | ICD-10-CM | POA: Diagnosis not present

## 2020-02-03 NOTE — Patient Instructions (Signed)
Medication Instructions:  No Changes *If you need a refill on your cardiac medications before your next appointment, please call your pharmacy*   Lab Work: Lipid Panel Today If you have labs (blood work) drawn today and your tests are completely normal, you will receive your results only by: Marland Kitchen MyChart Message (if you have MyChart) OR . A paper copy in the mail If you have any lab test that is abnormal or we need to change your treatment, we will call you to review the results.   Testing/Procedures: None ordered   Follow-Up: At [facility], you and your health needs are our priority.  As part of our continuing mission to provide you with exceptional heart care, we have created designated Provider Care Teams.  These Care Teams include your primary Cardiologist (physician) and Advanced Practice Providers (APPs -  Physician Assistants and Nurse Practitioners) who all work together to provide you with the care you need, when you need it.  We recommend signing up for the patient portal called "MyChart".  Sign up information is provided on this After Visit Summary.  MyChart is used to connect with patients for Virtual Visits (Telemedicine).  Patients are able to view lab/test results, encounter notes, upcoming appointments, etc.  Non-urgent messages can be sent to your provider as well.   To learn more about what you can do with MyChart, go to [email or web address].    Your next appointment:   12 month(s)  The format for your next appointment:   In Person  Provider:   You may see Sanda Klein, MD or one of the following Advanced Practice Providers on your designated Care Team:    Almyra Deforest, PA-C  Fabian Sharp, PA-C or   Roby Lofts, [place]

## 2020-02-03 NOTE — Progress Notes (Signed)
Cardiology office note   Evaluation Performed:  Follow-up visit  Date:  02/05/2020   ID:  Howard Jackson, DOB 09-19-1945, MRN [id number]  PCP:  Kristen Loader, FNP  Cardiologist:  Sanda Klein, MD  Electrophysiologist:  None   Chief Complaint: Fatigue, intermittent blood pressure problems  History of Present Illness:    Howard Jackson is a 74 y.o. male with CAD, PAD and hypertension related to an adrenal tumor.  Blood pressure control has been excellent and much more stable in the past.  He has been keeping a daily log and almost without exception his blood pressure is around 130/70.  He has no cardiovascular complaints but reports that he "hurts all over".  He wore a Zio arrhythmia monitor that showed normal sinus rhythm with occasional brief episodes of paroxysmal atrial tachycardia.  There was no atrial fibrillation and no significant ventricular arrhythmia.  The patient specifically denies any chest pain at rest exertion, dyspnea at rest or with exertion, orthopnea, paroxysmal nocturnal dyspnea, syncope, palpitations, new focal neurological deficits, intermittent claudication, lower extremity edema, unexplained weight gain, cough, hemoptysis or wheezing.  I do not have his most recent lipid labs, but his primary care provider told him that his cholesterol profile was all in desirable range.  His most recent creatinine checked on 12/16/2019 was 1.25 which is better than previous readings.  On the same date was 4.6.  He inquires about taking arjuna.  He bears a diagnosis of pheochromocytoma initially identified in 1989. 2010 abdominal MRI shows a 6 mm right adrenal mass consistent with pheochromocytoma.  I wonder if he truly has an adrenal adenoma causing hyperaldosteronism.  He has a history of extensive PAD and CAD:  - subtotal right popliteal occlusion with weak monophasic PT and Per flow and occluded AT. On the left all three calf vessels are occluded, with  collateral reconstitution of the PT and Per. Right ABI 0.73, left 0.77 (Doppler Sept 2014). There was no significant pelvic or femoral disease.  - He had moderate left renal artery stenosis (50-60% angio 2011, <60% Duplex 2014), but his most recent duplex ultrasound from 2015 did not show evidence of renal artery stenosis on either side..  - Carotid US 11/2013 at [facility] showed no significant lesions. Residual right spastic hemiparesis after a remote left hemispheric stroke. -  He has had 5 coronary angioplasty/stent  procedures to the RCA and LAD, most recently in April 2011 for LAD in-stent restenosis (when he received another drug-eluting stent)  In the past he had very prominent symptoms of orthostatic hypotension while taking clonidine. He was intolerant to treatment with PDE 5 inhibitors for erectile dysfunction due to nasal congestion and dyspepsia.    Past Medical History:  Diagnosis Date  . Adrenal gland disorder ([place])    tumor present - no change-  no recent increase    . Arthritis   . Benign pheochromocytoma of right adrenal gland 06/23/2014   Never had histological diagnosis, but reportedly initially diagnosed in 1989  . Bunion    left foot  . Complication of anesthesia    "died on the table during cervical fusion" '89 - believed to be d/t "too much medication" to treat HTN and was later dx with pheochromocytoma  . Coronary artery disease    cleared for surg. by Dr. Gwenlyn Found - [facility]  . H/O hiatal hernia   . Hammer toe   . Herniated lumbar intervertebral disc   . History of anxiety   . Hyperlipidemia   .  Hypertension   . Neurogenic bladder   . Neuromuscular disorder ([place])    carpal tunnel problem, even after surgery release   . Peripheral arterial disease ([place])   . Primary aldosteronism ([place])   . Stroke ([place])    in [place], L sided weakness, wears a brace on L leg & uses cane   Past Surgical History:  Procedure Laterality Date  . ANTERIOR APPROACH HEMI HIP ARTHROPLASTY  Left 04/15/2018   Procedure: ANTERIOR APPROACH HEMI HIP ARTHROPLASTY;  Surgeon: Rod Can, MD;  Location: [place];  Service: Orthopedics;  Laterality: Left;  . CARPAL TUNNEL RELEASE     bilateral   . CERVICAL FUSION    . CORONARY ANGIOPLASTY WITH STENT PLACEMENT     pt reports having 5 stents in his heart-2011  . HERNIA REPAIR    . INGUINAL HERNIA REPAIR Left 11/18/2012   Procedure: HERNIA REPAIR INGUINAL ADULT;  Surgeon: Harl Bowie, MD;  Location: [facility];  Service: General;  Laterality: Left;  . INSERTION OF MESH Left 11/18/2012   Procedure: INSERTION OF MESH;  Surgeon: Harl Bowie, MD;  Location: [facility];  Service: General;  Laterality: Left;     Current Meds  Medication Sig  . aspirin 325 MG tablet Take 650 mg by mouth once.  Marland Kitchen CITRUS BERGAMOT PO Take by mouth.  . clopidogrel (PLAVIX) 75 MG tablet TOME UNA TABLETA TODOS LOS DIAS  . finasteride (PROSCAR) 5 MG tablet Take 5 mg by mouth daily.  Marland Kitchen FLOVENT HFA 110 MCG/ACT inhaler Inhale 1 puff into the lungs 3 (three) times daily.   . Glucosamine-Chondroit-Vit C-Mn (FLEX-DS PO) Take 2 tablets by mouth every evening.   . labetalol (NORMODYNE) 300 MG tablet TAKE 1 TABLET (300 MG TOTAL) BY MOUTH 2 (TWO) TIMES DAILY.  Marland Kitchen lactobacillus acidophilus (BACID) TABS tablet Take 2 tablets by mouth 2 (two) times daily.  . mupirocin ointment (BACTROBAN) 2 %   . NIFEdipine (ADALAT CC) 30 MG 24 hr tablet TAKE 1 TABLET BY MOUTH EVERY EVENING (USE OTHER RX 60MG  IN MORNING ONLY)  . NIFEdipine (ADALAT CC) 60 MG 24 hr tablet TAKE 1 TABLET (60 MG TOTAL) BY MOUTH EVERY EVENING. (USE OTHER 30 MG RX IN THE MORNING ONLY)  . pantoprazole (PROTONIX) 40 MG tablet   . senna-docusate (SENOKOT-S) 8.6-50 MG tablet Take 3 tablets by mouth every evening. (Patient taking differently: Take 1 tablet by mouth at bedtime as needed for mild constipation. )  . simvastatin (ZOCOR) 40 MG tablet Take 1 tablet (40 mg total) by mouth every evening.  Marland Kitchen spironolactone  (ALDACTONE) 100 MG tablet Take 1 tablet (100 mg total) by mouth daily.  . tamsulosin (FLOMAX) 0.4 MG CAPS capsule Take 1 capsule (0.4 mg total) by mouth daily. (Patient taking differently: Take 0.4 mg by mouth 2 (two) times daily. )  . tiZANidine (ZANAFLEX) 4 MG tablet TAKE 2 TABLETS BY MOUTH AT BEDTIME AS NEEDED     Allergies:   Elavil [amitriptyline hcl] and Sulfa antibiotics   Social History   Tobacco Use  . Smoking status: Never Smoker  . Smokeless tobacco: Never Used  Vaping Use  . Vaping Use: Never used  Substance Use Topics  . Alcohol use: Yes    Comment: social - one drink, on special occasion   . Drug use: No     Family Hx: The patient's family history includes Alzheimer's disease in his father; CVA in his father and paternal grandmother; Diabetes in his father; Hypertension in his  brother, brother, mother, and sister.  ROS:   Please see the history of present illness.    All other systems reviewed and are negative.   Prior CV studies:   The following studies were reviewed today: Labs from outside source Carotid duplex ultrasound August 04, 2018  Labs/Other Tests and Data Reviewed:    EKG:  An ECG dated 05/15/2018 was personally reviewed today and demonstrated:  Normal sinus rhythm, left axis deviation not quite meeting criteria for left anterior fascicular block  Recent Labs: 12/16/2019: BUN 19; Creatinine, Ser 1.25; Hemoglobin 15.5; Platelets 193; Potassium 4.6; Sodium 136   Recent Lipid Panel Lab Results  Component Value Date/Time   CHOL 156 02/03/2020 02:50 PM   TRIG 52 02/03/2020 02:50 PM   HDL 49 02/03/2020 02:50 PM   CHOLHDL 3.2 02/03/2020 02:50 PM   CHOLHDL 3.1 05/22/2016 02:35 PM   LDLCALC 96 02/03/2020 02:50 PM    Wt Readings from Last 3 Encounters:  02/03/20 154 lb (69.9 kg)  12/16/19 161 lb (73 kg)  06/06/19 150 lb (68 kg)     Objective:    Vital Signs:  BP 128/69   Pulse 73   Ht 5\' 7"  (1.702 m)   Wt 154 lb (69.9 kg)   SpO2 99%   BMI  24.12 kg/m     General: Alert, oriented x3, no distress Head: no evidence of trauma, PERRL, EOMI, no exophtalmos or lid lag, no myxedema, no xanthelasma; normal ears, nose and oropharynx Neck: normal jugular venous pulsations and no hepatojugular reflux; brisk carotid pulses without delay and no carotid bruits Chest: clear to auscultation, no signs of consolidation by percussion or palpation, normal fremitus, symmetrical and full respiratory excursions Cardiovascular: normal position and quality of the apical impulse, regular rhythm, normal first and second heart sounds, no murmurs, rubs or gallops Abdomen: no tenderness or distention, no masses by palpation, no abnormal pulsatility or arterial bruits, normal bowel sounds, no hepatosplenomegaly Extremities: no clubbing, cyanosis or edema; 2+ radial, ulnar and brachial pulses bilaterally; 2+ right femoral, posterior tibial and dorsalis pedis pulses; 2+ left femoral, posterior tibial and dorsalis pedis pulses; no subclavian or femoral bruits Neurological: Spastic paresis of the left upper extremity Psych: Normal mood and affect   ASSESSMENT & PLAN:    1. CAD: Does not have angina pectoris.  He is fairly sedentary. 2. PAD: Also denies intermittent claudication which might also be masked by his sedentary lifestyle. 3. HTN: Excellent and consistently good blood pressure control.  Based on past history and response to medication he probably has primary hyperaldosteronism rather than pheochromocytoma.  Spironolactone should remain part of his medical hypertensive regimen.  Avoid unopposed beta blockers (he is on labetalol). 4. HLP: Labs were checked today and his LDL cholesterol is not in target range (less than 70).  Will recommend switching to rosuvastatin 20 mg once daily. 5. L hemiparesis: Sequela of old ischemic strokes, on lifelong clopidogrel.  Carotid duplex ultrasound performed January 2020 showed no evidence of significant obstruction in  either carotid artery.  No plan to perform routine carotid ultrasound testing, unless new symptoms develop.  Patient Instructions  Medication Instructions:  No Changes *If you need a refill on your cardiac medications before your next appointment, please call your pharmacy*   Lab Work: Lipid Panel Today If you have labs (blood work) drawn today and your tests are completely normal, you will receive your results only by: Marland Kitchen MyChart Message (if you have MyChart) OR . A paper copy in  the mail If you have any lab test that is abnormal or we need to change your treatment, we will call you to review the results.   Testing/Procedures: None ordered   Follow-Up: At [facility], you and your health needs are our priority.  As part of our continuing mission to provide you with exceptional heart care, we have created designated Provider Care Teams.  These Care Teams include your primary Cardiologist (physician) and Advanced Practice Providers (APPs -  Physician Assistants and Nurse Practitioners) who all work together to provide you with the care you need, when you need it.  We recommend signing up for the patient portal called "MyChart".  Sign up information is provided on this After Visit Summary.  MyChart is used to connect with patients for Virtual Visits (Telemedicine).  Patients are able to view lab/test results, encounter notes, upcoming appointments, etc.  Non-urgent messages can be sent to your provider as well.   To learn more about what you can do with MyChart, go to [email or web address].    Your next appointment:   12 month(s)  The format for your next appointment:   In Person  Provider:   You may see Sanda Klein, MD or one of the following Advanced Practice Providers on your designated Care Team:    Almyra Deforest, PA-C  Fabian Sharp, [place] or   Roby Lofts, PA-C       Signed, Sanda Klein, MD  02/05/2020 1:39 PM    [place]

## 2020-02-04 LAB — LIPID PANEL
Chol/HDL Ratio: 3.2 ratio (ref 0.0–5.0)
Cholesterol, Total: 156 mg/dL (ref 100–199)
HDL: 49 mg/dL (ref >39)
LDL Chol Calc (NIH): 96 mg/dL (ref 0–99)
Triglycerides: 52 mg/dL (ref 0–149)
VLDL Cholesterol Cal: 11 mg/dL (ref 5–40)

## 2020-02-05 ENCOUNTER — Encounter: Payer: Self-pay | Admitting: Cardiovascular Disease

## 2020-02-07 ENCOUNTER — Other Ambulatory Visit: Payer: Self-pay | Admitting: *Deleted

## 2020-02-07 DIAGNOSIS — E7800 Pure hypercholesterolemia, unspecified: Secondary | ICD-10-CM

## 2020-02-07 MED ORDER — ROSUVASTATIN CALCIUM 20 MG PO TABS: 20.0000 mg | ORAL_TABLET | Freq: Every day | ORAL | 3 refills | Status: DC

## 2020-02-08 ENCOUNTER — Telehealth: Payer: Self-pay

## 2020-02-08 NOTE — Telephone Encounter (Signed)
   [facility] Pre-operative Risk Assessment    Request for surgical clearance:  1. What type of surgery is being performed? L4-5 TRANSLAMINAR EPIDURAL STEROID INJECTION   2. When is this surgery scheduled? TBD   3. What type of clearance is required (medical clearance vs. Pharmacy clearance to hold med vs. Both)? BOTH  4. Are there any medications that need to be held prior to surgery and how long? PLAVIX   5. Practice name and name of physician performing surgery? [facility] ATTN: [place]   6. What is the office phone number? [phone]   7.   What is the office fax number? [phone]  8.   Anesthesia type (None, local, MAC, general) ? IV SEDATION

## 2020-02-08 NOTE — Telephone Encounter (Signed)
Dr Sallyanne Kuster we need clearance to hold Plavix for epidural injection.  Please respond to CV DIV PRE OP.  Thanks  Kerin Ransom PA-C 02/08/2020 2:45 PM

## 2020-02-08 NOTE — Telephone Encounter (Signed)
OK to hold clopidogrel for 5-7 days before epidural

## 2020-02-08 NOTE — Telephone Encounter (Signed)
   Primary Cardiologist: Sanda Klein, MD  Chart reviewed and patient contacted today by phone as part of pre-operative protocol coverage. Given past medical history and time since last visit, based on ACC/AHA guidelines, Nijel Flink would be at acceptable risk for the planned procedure without further cardiovascular testing.   OK to hold Plavix 5-7 days pre op if needed, resume as soon as possible post op.  I will route this recommendation to the requesting party via Epic fax function and remove from pre-op pool.  Please call with questions.  Kerin Ransom, PA-C 02/08/2020, 3:15 PM

## 2020-02-21 ENCOUNTER — Telehealth: Payer: Self-pay | Admitting: Cardiovascular Disease

## 2020-02-21 NOTE — Telephone Encounter (Signed)
     Pt called, he said he received a bill for $92 from [employer], he said he wasn't aware that he have to pay for that.he said, Dr. Loletha Grayer could have let him know about the bill.

## 2020-03-05 DIAGNOSIS — M47816 Spondylosis without myelopathy or radiculopathy, lumbar region: Secondary | ICD-10-CM | POA: Diagnosis not present

## 2020-03-05 DIAGNOSIS — I639 Cerebral infarction, unspecified: Secondary | ICD-10-CM | POA: Diagnosis not present

## 2020-03-05 DIAGNOSIS — I2510 Atherosclerotic heart disease of native coronary artery without angina pectoris: Secondary | ICD-10-CM | POA: Diagnosis not present

## 2020-03-05 DIAGNOSIS — J4520 Mild intermittent asthma, uncomplicated: Secondary | ICD-10-CM | POA: Diagnosis not present

## 2020-03-05 DIAGNOSIS — K219 Gastro-esophageal reflux disease without esophagitis: Secondary | ICD-10-CM | POA: Diagnosis not present

## 2020-03-05 DIAGNOSIS — E785 Hyperlipidemia, unspecified: Secondary | ICD-10-CM | POA: Diagnosis not present

## 2020-03-05 DIAGNOSIS — I10 Essential (primary) hypertension: Secondary | ICD-10-CM | POA: Diagnosis not present

## 2020-03-05 DIAGNOSIS — N401 Enlarged prostate with lower urinary tract symptoms: Secondary | ICD-10-CM | POA: Diagnosis not present

## 2020-04-01 ENCOUNTER — Other Ambulatory Visit: Payer: Self-pay | Admitting: Cardiovascular Disease

## 2020-04-05 DIAGNOSIS — J22 Unspecified acute lower respiratory infection: Secondary | ICD-10-CM | POA: Diagnosis not present

## 2020-04-05 DIAGNOSIS — J029 Acute pharyngitis, unspecified: Secondary | ICD-10-CM | POA: Diagnosis not present

## 2020-04-05 DIAGNOSIS — R05 Cough: Secondary | ICD-10-CM | POA: Diagnosis not present

## 2020-04-10 DIAGNOSIS — N401 Enlarged prostate with lower urinary tract symptoms: Secondary | ICD-10-CM | POA: Diagnosis not present

## 2020-04-10 DIAGNOSIS — I10 Essential (primary) hypertension: Secondary | ICD-10-CM | POA: Diagnosis not present

## 2020-04-10 DIAGNOSIS — K219 Gastro-esophageal reflux disease without esophagitis: Secondary | ICD-10-CM | POA: Diagnosis not present

## 2020-04-10 DIAGNOSIS — E785 Hyperlipidemia, unspecified: Secondary | ICD-10-CM | POA: Diagnosis not present

## 2020-04-10 DIAGNOSIS — I639 Cerebral infarction, unspecified: Secondary | ICD-10-CM | POA: Diagnosis not present

## 2020-04-10 DIAGNOSIS — J4520 Mild intermittent asthma, uncomplicated: Secondary | ICD-10-CM | POA: Diagnosis not present

## 2020-04-10 DIAGNOSIS — M47816 Spondylosis without myelopathy or radiculopathy, lumbar region: Secondary | ICD-10-CM | POA: Diagnosis not present

## 2020-04-10 DIAGNOSIS — I2510 Atherosclerotic heart disease of native coronary artery without angina pectoris: Secondary | ICD-10-CM | POA: Diagnosis not present

## 2020-04-17 ENCOUNTER — Other Ambulatory Visit: Payer: Self-pay | Admitting: Cardiovascular Disease

## 2020-04-30 DIAGNOSIS — I639 Cerebral infarction, unspecified: Secondary | ICD-10-CM | POA: Diagnosis not present

## 2020-04-30 DIAGNOSIS — J4520 Mild intermittent asthma, uncomplicated: Secondary | ICD-10-CM | POA: Diagnosis not present

## 2020-04-30 DIAGNOSIS — N401 Enlarged prostate with lower urinary tract symptoms: Secondary | ICD-10-CM | POA: Diagnosis not present

## 2020-04-30 DIAGNOSIS — I10 Essential (primary) hypertension: Secondary | ICD-10-CM | POA: Diagnosis not present

## 2020-04-30 DIAGNOSIS — I2510 Atherosclerotic heart disease of native coronary artery without angina pectoris: Secondary | ICD-10-CM | POA: Diagnosis not present

## 2020-04-30 DIAGNOSIS — K219 Gastro-esophageal reflux disease without esophagitis: Secondary | ICD-10-CM | POA: Diagnosis not present

## 2020-04-30 DIAGNOSIS — M47816 Spondylosis without myelopathy or radiculopathy, lumbar region: Secondary | ICD-10-CM | POA: Diagnosis not present

## 2020-04-30 DIAGNOSIS — E785 Hyperlipidemia, unspecified: Secondary | ICD-10-CM | POA: Diagnosis not present

## 2020-05-04 ENCOUNTER — Other Ambulatory Visit: Payer: Self-pay

## 2020-05-04 DIAGNOSIS — I152 Hypertension secondary to endocrine disorders: Secondary | ICD-10-CM

## 2020-05-04 MED ORDER — LABETALOL HCL 300 MG PO TABS: 300.0000 mg | ORAL_TABLET | Freq: Two times a day (BID) | ORAL | 3 refills | Status: DC

## 2020-05-18 DIAGNOSIS — N183 Chronic kidney disease, stage 3 unspecified: Secondary | ICD-10-CM | POA: Diagnosis not present

## 2020-05-18 DIAGNOSIS — D497 Neoplasm of unspecified behavior of endocrine glands and other parts of nervous system: Secondary | ICD-10-CM | POA: Diagnosis not present

## 2020-05-18 DIAGNOSIS — Z6824 Body mass index (BMI) 24.0-24.9, adult: Secondary | ICD-10-CM | POA: Diagnosis not present

## 2020-05-18 DIAGNOSIS — E2609 Other primary hyperaldosteronism: Secondary | ICD-10-CM | POA: Diagnosis not present

## 2020-05-18 DIAGNOSIS — Z9989 Dependence on other enabling machines and devices: Secondary | ICD-10-CM | POA: Diagnosis not present

## 2020-05-18 DIAGNOSIS — Z7902 Long term (current) use of antithrombotics/antiplatelets: Secondary | ICD-10-CM | POA: Diagnosis not present

## 2020-05-18 DIAGNOSIS — I69354 Hemiplegia and hemiparesis following cerebral infarction affecting left non-dominant side: Secondary | ICD-10-CM | POA: Diagnosis not present

## 2020-05-21 DIAGNOSIS — N401 Enlarged prostate with lower urinary tract symptoms: Secondary | ICD-10-CM | POA: Diagnosis not present

## 2020-05-21 DIAGNOSIS — N529 Male erectile dysfunction, unspecified: Secondary | ICD-10-CM | POA: Diagnosis not present

## 2020-05-21 DIAGNOSIS — N138 Other obstructive and reflux uropathy: Secondary | ICD-10-CM | POA: Diagnosis not present

## 2020-05-24 DIAGNOSIS — H1013 Acute atopic conjunctivitis, bilateral: Secondary | ICD-10-CM | POA: Diagnosis not present

## 2020-05-24 DIAGNOSIS — Z0000 Encounter for general adult medical examination without abnormal findings: Secondary | ICD-10-CM | POA: Diagnosis not present

## 2020-05-24 DIAGNOSIS — M48061 Spinal stenosis, lumbar region without neurogenic claudication: Secondary | ICD-10-CM | POA: Diagnosis not present

## 2020-05-24 DIAGNOSIS — K219 Gastro-esophageal reflux disease without esophagitis: Secondary | ICD-10-CM | POA: Diagnosis not present

## 2020-05-24 DIAGNOSIS — L853 Xerosis cutis: Secondary | ICD-10-CM | POA: Diagnosis not present

## 2020-05-24 DIAGNOSIS — M25531 Pain in right wrist: Secondary | ICD-10-CM | POA: Diagnosis not present

## 2020-06-06 DIAGNOSIS — K219 Gastro-esophageal reflux disease without esophagitis: Secondary | ICD-10-CM | POA: Diagnosis not present

## 2020-06-06 DIAGNOSIS — G8929 Other chronic pain: Secondary | ICD-10-CM | POA: Diagnosis not present

## 2020-06-06 DIAGNOSIS — E785 Hyperlipidemia, unspecified: Secondary | ICD-10-CM | POA: Diagnosis not present

## 2020-06-06 DIAGNOSIS — I10 Essential (primary) hypertension: Secondary | ICD-10-CM | POA: Diagnosis not present

## 2020-06-06 DIAGNOSIS — I639 Cerebral infarction, unspecified: Secondary | ICD-10-CM | POA: Diagnosis not present

## 2020-06-06 DIAGNOSIS — N401 Enlarged prostate with lower urinary tract symptoms: Secondary | ICD-10-CM | POA: Diagnosis not present

## 2020-06-06 DIAGNOSIS — I2510 Atherosclerotic heart disease of native coronary artery without angina pectoris: Secondary | ICD-10-CM | POA: Diagnosis not present

## 2020-06-06 DIAGNOSIS — J4520 Mild intermittent asthma, uncomplicated: Secondary | ICD-10-CM | POA: Diagnosis not present

## 2020-06-06 DIAGNOSIS — M47816 Spondylosis without myelopathy or radiculopathy, lumbar region: Secondary | ICD-10-CM | POA: Diagnosis not present

## 2020-06-12 DIAGNOSIS — I10 Essential (primary) hypertension: Secondary | ICD-10-CM | POA: Diagnosis not present

## 2020-06-12 DIAGNOSIS — E279 Disorder of adrenal gland, unspecified: Secondary | ICD-10-CM | POA: Diagnosis not present

## 2020-06-12 DIAGNOSIS — E2609 Other primary hyperaldosteronism: Secondary | ICD-10-CM | POA: Diagnosis not present

## 2020-06-21 ENCOUNTER — Telehealth: Payer: Self-pay | Admitting: Cardiovascular Disease

## 2020-06-21 MED ORDER — NIFEDIPINE ER 30 MG PO TB24: 30.0000 mg | ORAL_TABLET | Freq: Every evening | ORAL | 1 refills | Status: DC

## 2020-06-21 MED ORDER — NIFEDIPINE ER 60 MG PO TB24: 60.0000 mg | ORAL_TABLET | Freq: Every evening | ORAL | 1 refills | Status: DC

## 2020-06-21 NOTE — Telephone Encounter (Signed)
Defer zanaflex to PCP/pain specialist

## 2020-06-21 NOTE — Telephone Encounter (Signed)
° ° ° ° °*  STAT* If patient is at the pharmacy, call can be transferred to refill team.   1. Which medications need to be refilled? (please list name of each medication and dose if known)   NIFEdipine (ADALAT CC) 30 MG 24 hr tablet    NIFEdipine (ADALAT CC) 60 MG 24 hr tablet    tiZANidine (ZANAFLEX) 4 MG tablet    2. Which pharmacy/location (including street and city if local pharmacy) is medication to be sent to? [place], [place]  3. Do they need a 30 day or 90 day supply? 90 days  Lainie from [facility] called, they need to get new prescription sent for these medications

## 2020-07-05 ENCOUNTER — Encounter: Payer: Self-pay | Admitting: *Deleted

## 2020-07-05 ENCOUNTER — Other Ambulatory Visit: Payer: Self-pay | Admitting: *Deleted

## 2020-07-05 DIAGNOSIS — E7800 Pure hypercholesterolemia, unspecified: Secondary | ICD-10-CM

## 2020-07-09 DIAGNOSIS — G8929 Other chronic pain: Secondary | ICD-10-CM | POA: Diagnosis not present

## 2020-07-09 DIAGNOSIS — E785 Hyperlipidemia, unspecified: Secondary | ICD-10-CM | POA: Diagnosis not present

## 2020-07-09 DIAGNOSIS — N401 Enlarged prostate with lower urinary tract symptoms: Secondary | ICD-10-CM | POA: Diagnosis not present

## 2020-07-09 DIAGNOSIS — J4520 Mild intermittent asthma, uncomplicated: Secondary | ICD-10-CM | POA: Diagnosis not present

## 2020-07-09 DIAGNOSIS — I2510 Atherosclerotic heart disease of native coronary artery without angina pectoris: Secondary | ICD-10-CM | POA: Diagnosis not present

## 2020-07-09 DIAGNOSIS — I639 Cerebral infarction, unspecified: Secondary | ICD-10-CM | POA: Diagnosis not present

## 2020-07-09 DIAGNOSIS — I10 Essential (primary) hypertension: Secondary | ICD-10-CM | POA: Diagnosis not present

## 2020-07-09 DIAGNOSIS — M47816 Spondylosis without myelopathy or radiculopathy, lumbar region: Secondary | ICD-10-CM | POA: Diagnosis not present

## 2020-07-09 DIAGNOSIS — K219 Gastro-esophageal reflux disease without esophagitis: Secondary | ICD-10-CM | POA: Diagnosis not present

## 2020-07-12 DIAGNOSIS — J22 Unspecified acute lower respiratory infection: Secondary | ICD-10-CM | POA: Diagnosis not present

## 2020-07-12 DIAGNOSIS — Z03818 Encounter for observation for suspected exposure to other biological agents ruled out: Secondary | ICD-10-CM | POA: Diagnosis not present

## 2020-07-14 ENCOUNTER — Other Ambulatory Visit: Payer: Self-pay | Admitting: Cardiovascular Disease

## 2020-07-14 DIAGNOSIS — G8191 Hemiplegia, unspecified affecting right dominant side: Secondary | ICD-10-CM

## 2020-07-14 DIAGNOSIS — I2510 Atherosclerotic heart disease of native coronary artery without angina pectoris: Secondary | ICD-10-CM

## 2020-07-16 ENCOUNTER — Other Ambulatory Visit: Payer: Self-pay

## 2020-07-16 MED ORDER — NIFEDIPINE ER 60 MG PO TB24: 60.0000 mg | ORAL_TABLET | Freq: Every evening | ORAL | 1 refills | Status: DC

## 2020-07-16 MED ORDER — NIFEDIPINE ER 30 MG PO TB24: 30.0000 mg | ORAL_TABLET | Freq: Every evening | ORAL | 1 refills | Status: DC

## 2020-07-31 DIAGNOSIS — Z87438 Personal history of other diseases of male genital organs: Secondary | ICD-10-CM | POA: Diagnosis not present

## 2020-07-31 DIAGNOSIS — R350 Frequency of micturition: Secondary | ICD-10-CM | POA: Diagnosis not present

## 2020-08-08 DIAGNOSIS — I639 Cerebral infarction, unspecified: Secondary | ICD-10-CM | POA: Diagnosis not present

## 2020-08-08 DIAGNOSIS — E785 Hyperlipidemia, unspecified: Secondary | ICD-10-CM | POA: Diagnosis not present

## 2020-08-08 DIAGNOSIS — G8929 Other chronic pain: Secondary | ICD-10-CM | POA: Diagnosis not present

## 2020-08-08 DIAGNOSIS — J4520 Mild intermittent asthma, uncomplicated: Secondary | ICD-10-CM | POA: Diagnosis not present

## 2020-08-08 DIAGNOSIS — K219 Gastro-esophageal reflux disease without esophagitis: Secondary | ICD-10-CM | POA: Diagnosis not present

## 2020-08-08 DIAGNOSIS — M47816 Spondylosis without myelopathy or radiculopathy, lumbar region: Secondary | ICD-10-CM | POA: Diagnosis not present

## 2020-08-08 DIAGNOSIS — N401 Enlarged prostate with lower urinary tract symptoms: Secondary | ICD-10-CM | POA: Diagnosis not present

## 2020-08-08 DIAGNOSIS — I10 Essential (primary) hypertension: Secondary | ICD-10-CM | POA: Diagnosis not present

## 2020-08-08 DIAGNOSIS — I2510 Atherosclerotic heart disease of native coronary artery without angina pectoris: Secondary | ICD-10-CM | POA: Diagnosis not present

## 2020-08-10 DIAGNOSIS — N390 Urinary tract infection, site not specified: Secondary | ICD-10-CM | POA: Diagnosis not present

## 2020-08-10 DIAGNOSIS — R350 Frequency of micturition: Secondary | ICD-10-CM | POA: Diagnosis not present

## 2020-08-17 DIAGNOSIS — N390 Urinary tract infection, site not specified: Secondary | ICD-10-CM | POA: Diagnosis not present

## 2020-08-21 DIAGNOSIS — S91112A Laceration without foreign body of left great toe without damage to nail, initial encounter: Secondary | ICD-10-CM | POA: Diagnosis not present

## 2020-08-21 DIAGNOSIS — S91115A Laceration without foreign body of left lesser toe(s) without damage to nail, initial encounter: Secondary | ICD-10-CM | POA: Diagnosis not present

## 2020-08-29 DIAGNOSIS — L299 Pruritus, unspecified: Secondary | ICD-10-CM | POA: Diagnosis not present

## 2020-08-29 DIAGNOSIS — G8194 Hemiplegia, unspecified affecting left nondominant side: Secondary | ICD-10-CM | POA: Diagnosis not present

## 2020-08-29 DIAGNOSIS — M21612 Bunion of left foot: Secondary | ICD-10-CM | POA: Diagnosis not present

## 2020-08-29 DIAGNOSIS — M2042 Other hammer toe(s) (acquired), left foot: Secondary | ICD-10-CM | POA: Diagnosis not present

## 2020-08-29 DIAGNOSIS — M2550 Pain in unspecified joint: Secondary | ICD-10-CM | POA: Diagnosis not present

## 2020-08-29 DIAGNOSIS — S91112D Laceration without foreign body of left great toe without damage to nail, subsequent encounter: Secondary | ICD-10-CM | POA: Diagnosis not present

## 2020-08-29 DIAGNOSIS — K5900 Constipation, unspecified: Secondary | ICD-10-CM | POA: Diagnosis not present

## 2020-08-29 DIAGNOSIS — S91115D Laceration without foreign body of left lesser toe(s) without damage to nail, subsequent encounter: Secondary | ICD-10-CM | POA: Diagnosis not present

## 2020-08-29 DIAGNOSIS — I10 Essential (primary) hypertension: Secondary | ICD-10-CM | POA: Diagnosis not present

## 2020-08-29 DIAGNOSIS — B351 Tinea unguium: Secondary | ICD-10-CM | POA: Diagnosis not present

## 2020-09-07 DIAGNOSIS — I639 Cerebral infarction, unspecified: Secondary | ICD-10-CM | POA: Diagnosis not present

## 2020-09-07 DIAGNOSIS — J4520 Mild intermittent asthma, uncomplicated: Secondary | ICD-10-CM | POA: Diagnosis not present

## 2020-09-07 DIAGNOSIS — N401 Enlarged prostate with lower urinary tract symptoms: Secondary | ICD-10-CM | POA: Diagnosis not present

## 2020-09-07 DIAGNOSIS — I2510 Atherosclerotic heart disease of native coronary artery without angina pectoris: Secondary | ICD-10-CM | POA: Diagnosis not present

## 2020-09-07 DIAGNOSIS — G8929 Other chronic pain: Secondary | ICD-10-CM | POA: Diagnosis not present

## 2020-09-07 DIAGNOSIS — E785 Hyperlipidemia, unspecified: Secondary | ICD-10-CM | POA: Diagnosis not present

## 2020-09-07 DIAGNOSIS — K219 Gastro-esophageal reflux disease without esophagitis: Secondary | ICD-10-CM | POA: Diagnosis not present

## 2020-09-07 DIAGNOSIS — I10 Essential (primary) hypertension: Secondary | ICD-10-CM | POA: Diagnosis not present

## 2020-09-19 DIAGNOSIS — Z7902 Long term (current) use of antithrombotics/antiplatelets: Secondary | ICD-10-CM | POA: Diagnosis not present

## 2020-09-19 DIAGNOSIS — I69354 Hemiplegia and hemiparesis following cerebral infarction affecting left non-dominant side: Secondary | ICD-10-CM | POA: Diagnosis not present

## 2020-09-19 DIAGNOSIS — I2510 Atherosclerotic heart disease of native coronary artery without angina pectoris: Secondary | ICD-10-CM | POA: Diagnosis not present

## 2020-09-19 DIAGNOSIS — E2609 Other primary hyperaldosteronism: Secondary | ICD-10-CM | POA: Diagnosis not present

## 2020-09-19 DIAGNOSIS — N183 Chronic kidney disease, stage 3 unspecified: Secondary | ICD-10-CM | POA: Diagnosis not present

## 2020-09-19 DIAGNOSIS — D497 Neoplasm of unspecified behavior of endocrine glands and other parts of nervous system: Secondary | ICD-10-CM | POA: Diagnosis not present

## 2020-09-19 DIAGNOSIS — I739 Peripheral vascular disease, unspecified: Secondary | ICD-10-CM | POA: Diagnosis not present

## 2020-09-21 ENCOUNTER — Other Ambulatory Visit: Payer: Self-pay

## 2020-09-21 ENCOUNTER — Ambulatory Visit (INDEPENDENT_AMBULATORY_CARE_PROVIDER_SITE_OTHER): Payer: Medicare HMO | Admitting: Podiatry

## 2020-09-21 ENCOUNTER — Encounter: Payer: Self-pay | Admitting: Podiatry

## 2020-09-21 DIAGNOSIS — B351 Tinea unguium: Secondary | ICD-10-CM | POA: Diagnosis not present

## 2020-09-21 DIAGNOSIS — M79675 Pain in left toe(s): Secondary | ICD-10-CM

## 2020-09-21 DIAGNOSIS — M79674 Pain in right toe(s): Secondary | ICD-10-CM | POA: Diagnosis not present

## 2020-09-25 ENCOUNTER — Encounter: Payer: Self-pay | Admitting: Podiatry

## 2020-09-25 NOTE — Progress Notes (Signed)
  Subjective:  Patient ID: Howard Jackson, male    DOB: 30-Oct-1945,  MRN: [id number]  Chief Complaint  Patient presents with  . Nail Problem    Nail trim    75 y.o. male returns for the above complaint.  Patient presents with thickened elongated dystrophic toenails x10.  Painful to palpation.  Patient would like to have them professionally debrided down as he  is not able to take care of it himself.  He states that there is painful to touch.  Painful when ambulating.  He denies any other acute complaints.  He denies having diabetes  Objective:  There were no vitals filed for this visit. Podiatric Exam: Vascular: dorsalis pedis and posterior tibial pulses are palpable bilateral. Capillary return is immediate. Temperature gradient is WNL. Skin turgor WNL  Sensorium: Normal Semmes Weinstein monofilament test. Normal tactile sensation bilaterally. Nail Exam: Pt has thick disfigured discolored nails with subungual debris noted bilateral entire nail hallux through fifth toenails.  Pain on palpation to the nails. Ulcer Exam: There is no evidence of ulcer or pre-ulcerative changes or infection. Orthopedic Exam: Muscle tone and strength are WNL. No limitations in general ROM. No crepitus or effusions noted. HAV  B/L.  Hammer toes 2-5  B/L. Skin: No Porokeratosis. No infection or ulcers    Assessment & Plan:   1. Pain due to onychomycosis of toenails of both feet     Patient was evaluated and treated and all questions answered.  Onychomycosis with pain  -Nails palliatively debrided as below. -Educated on self-care  Procedure: Nail Debridement Rationale: pain  Type of Debridement: manual, sharp debridement. Instrumentation: Nail nipper, rotary burr. Number of Nails: 10  Procedures and Treatment: Consent by patient was obtained for treatment procedures. The patient understood the discussion of treatment and procedures well. All questions were answered thoroughly reviewed. Debridement  of mycotic and hypertrophic toenails, 1 through 5 bilateral and clearing of subungual debris. No ulceration, no infection noted.  Return Visit-Office Procedure: Patient instructed to return to the office for a follow up visit 3 months for continued evaluation and treatment.  [name], DPM    Return in about 3 months (around 12/22/2020) for DR MAYER .

## 2020-10-02 DIAGNOSIS — R1013 Epigastric pain: Secondary | ICD-10-CM | POA: Diagnosis not present

## 2020-10-02 DIAGNOSIS — K5900 Constipation, unspecified: Secondary | ICD-10-CM | POA: Diagnosis not present

## 2020-10-11 DIAGNOSIS — I2510 Atherosclerotic heart disease of native coronary artery without angina pectoris: Secondary | ICD-10-CM | POA: Diagnosis not present

## 2020-10-11 DIAGNOSIS — N401 Enlarged prostate with lower urinary tract symptoms: Secondary | ICD-10-CM | POA: Diagnosis not present

## 2020-10-11 DIAGNOSIS — G8929 Other chronic pain: Secondary | ICD-10-CM | POA: Diagnosis not present

## 2020-10-11 DIAGNOSIS — J4520 Mild intermittent asthma, uncomplicated: Secondary | ICD-10-CM | POA: Diagnosis not present

## 2020-10-11 DIAGNOSIS — I639 Cerebral infarction, unspecified: Secondary | ICD-10-CM | POA: Diagnosis not present

## 2020-10-11 DIAGNOSIS — K219 Gastro-esophageal reflux disease without esophagitis: Secondary | ICD-10-CM | POA: Diagnosis not present

## 2020-10-11 DIAGNOSIS — I10 Essential (primary) hypertension: Secondary | ICD-10-CM | POA: Diagnosis not present

## 2020-10-11 DIAGNOSIS — E785 Hyperlipidemia, unspecified: Secondary | ICD-10-CM | POA: Diagnosis not present

## 2020-10-11 DIAGNOSIS — M47816 Spondylosis without myelopathy or radiculopathy, lumbar region: Secondary | ICD-10-CM | POA: Diagnosis not present

## 2020-10-19 ENCOUNTER — Other Ambulatory Visit: Payer: Self-pay

## 2020-10-19 ENCOUNTER — Other Ambulatory Visit: Payer: Self-pay | Admitting: Gastroenterology

## 2020-10-19 ENCOUNTER — Ambulatory Visit
Admission: RE | Admit: 2020-10-19 | Discharge: 2020-10-19 | Disposition: A | Discharge status: Home / Self Care | Payer: Medicare HMO | Source: Ambulatory Visit | Attending: Gastroenterology | Admitting: Gastroenterology

## 2020-10-19 DIAGNOSIS — R1031 Right lower quadrant pain: Secondary | ICD-10-CM | POA: Diagnosis not present

## 2020-10-19 DIAGNOSIS — R109 Unspecified abdominal pain: Secondary | ICD-10-CM | POA: Diagnosis not present

## 2020-10-30 DIAGNOSIS — K5900 Constipation, unspecified: Secondary | ICD-10-CM | POA: Diagnosis not present

## 2020-10-30 DIAGNOSIS — K219 Gastro-esophageal reflux disease without esophagitis: Secondary | ICD-10-CM | POA: Diagnosis not present

## 2020-11-09 DIAGNOSIS — N401 Enlarged prostate with lower urinary tract symptoms: Secondary | ICD-10-CM | POA: Diagnosis not present

## 2020-11-09 DIAGNOSIS — K219 Gastro-esophageal reflux disease without esophagitis: Secondary | ICD-10-CM | POA: Diagnosis not present

## 2020-11-09 DIAGNOSIS — I10 Essential (primary) hypertension: Secondary | ICD-10-CM | POA: Diagnosis not present

## 2020-11-09 DIAGNOSIS — I2510 Atherosclerotic heart disease of native coronary artery without angina pectoris: Secondary | ICD-10-CM | POA: Diagnosis not present

## 2020-11-09 DIAGNOSIS — I639 Cerebral infarction, unspecified: Secondary | ICD-10-CM | POA: Diagnosis not present

## 2020-11-09 DIAGNOSIS — M47816 Spondylosis without myelopathy or radiculopathy, lumbar region: Secondary | ICD-10-CM | POA: Diagnosis not present

## 2020-11-09 DIAGNOSIS — E785 Hyperlipidemia, unspecified: Secondary | ICD-10-CM | POA: Diagnosis not present

## 2020-11-09 DIAGNOSIS — J4520 Mild intermittent asthma, uncomplicated: Secondary | ICD-10-CM | POA: Diagnosis not present

## 2020-11-09 DIAGNOSIS — G8929 Other chronic pain: Secondary | ICD-10-CM | POA: Diagnosis not present

## 2020-11-21 DIAGNOSIS — J4520 Mild intermittent asthma, uncomplicated: Secondary | ICD-10-CM | POA: Diagnosis not present

## 2020-11-21 DIAGNOSIS — N401 Enlarged prostate with lower urinary tract symptoms: Secondary | ICD-10-CM | POA: Diagnosis not present

## 2020-11-21 DIAGNOSIS — I10 Essential (primary) hypertension: Secondary | ICD-10-CM | POA: Diagnosis not present

## 2020-11-21 DIAGNOSIS — G8929 Other chronic pain: Secondary | ICD-10-CM | POA: Diagnosis not present

## 2020-11-21 DIAGNOSIS — K219 Gastro-esophageal reflux disease without esophagitis: Secondary | ICD-10-CM | POA: Diagnosis not present

## 2020-11-21 DIAGNOSIS — I639 Cerebral infarction, unspecified: Secondary | ICD-10-CM | POA: Diagnosis not present

## 2020-11-21 DIAGNOSIS — M47816 Spondylosis without myelopathy or radiculopathy, lumbar region: Secondary | ICD-10-CM | POA: Diagnosis not present

## 2020-11-21 DIAGNOSIS — E785 Hyperlipidemia, unspecified: Secondary | ICD-10-CM | POA: Diagnosis not present

## 2020-11-21 DIAGNOSIS — I2510 Atherosclerotic heart disease of native coronary artery without angina pectoris: Secondary | ICD-10-CM | POA: Diagnosis not present

## 2020-12-11 DIAGNOSIS — I10 Essential (primary) hypertension: Secondary | ICD-10-CM | POA: Diagnosis not present

## 2020-12-11 DIAGNOSIS — R6882 Decreased libido: Secondary | ICD-10-CM | POA: Diagnosis not present

## 2020-12-11 DIAGNOSIS — E2609 Other primary hyperaldosteronism: Secondary | ICD-10-CM | POA: Diagnosis not present

## 2020-12-11 DIAGNOSIS — E279 Disorder of adrenal gland, unspecified: Secondary | ICD-10-CM | POA: Diagnosis not present

## 2020-12-24 ENCOUNTER — Telehealth: Payer: Self-pay | Admitting: Cardiovascular Disease

## 2020-12-24 MED ORDER — NIFEDIPINE ER 60 MG PO TB24: 60.0000 mg | ORAL_TABLET | Freq: Every evening | ORAL | 3 refills | Status: DC

## 2020-12-24 MED ORDER — NIFEDIPINE ER 30 MG PO TB24: ORAL_TABLET | ORAL | 3 refills | Status: DC

## 2020-12-24 NOTE — Telephone Encounter (Signed)
Pt c/o medication issue:  1. Name of Medication:  NIFEdipine (ADALAT CC) 30 MG 24 hr tablet NIFEdipine (ADALAT CC) 60 MG 24 hr tablet  2. How are you currently taking this medication (dosage and times per day)? N/A  3. Are you having a reaction (difficulty breathing--STAT)? Not that they are aware   4. What is your medication issue? Pharmacy is calling requesting an updated prescription for the 30 MG's due to the directions for taking not matching. States he would need a 90 day supply to the pharmacy listed in the contact info. Please advise.

## 2020-12-24 NOTE — Telephone Encounter (Signed)
Called patient phone # listed in chart is a non working #.Called patient's daughter Amalthea.She stated father takes Nifedipine 30 mg in am and 60 mg at night.New prescriptions sent to pharmacy.

## 2021-01-01 DIAGNOSIS — M47816 Spondylosis without myelopathy or radiculopathy, lumbar region: Secondary | ICD-10-CM | POA: Diagnosis not present

## 2021-01-01 DIAGNOSIS — G8929 Other chronic pain: Secondary | ICD-10-CM | POA: Diagnosis not present

## 2021-01-01 DIAGNOSIS — K219 Gastro-esophageal reflux disease without esophagitis: Secondary | ICD-10-CM | POA: Diagnosis not present

## 2021-01-01 DIAGNOSIS — I2510 Atherosclerotic heart disease of native coronary artery without angina pectoris: Secondary | ICD-10-CM | POA: Diagnosis not present

## 2021-01-01 DIAGNOSIS — I10 Essential (primary) hypertension: Secondary | ICD-10-CM | POA: Diagnosis not present

## 2021-01-01 DIAGNOSIS — J4520 Mild intermittent asthma, uncomplicated: Secondary | ICD-10-CM | POA: Diagnosis not present

## 2021-01-01 DIAGNOSIS — N401 Enlarged prostate with lower urinary tract symptoms: Secondary | ICD-10-CM | POA: Diagnosis not present

## 2021-01-01 DIAGNOSIS — E785 Hyperlipidemia, unspecified: Secondary | ICD-10-CM | POA: Diagnosis not present

## 2021-01-01 DIAGNOSIS — I639 Cerebral infarction, unspecified: Secondary | ICD-10-CM | POA: Diagnosis not present

## 2021-01-11 ENCOUNTER — Encounter (HOSPITAL_COMMUNITY): Payer: Self-pay | Admitting: *Deleted

## 2021-01-11 ENCOUNTER — Other Ambulatory Visit: Payer: Self-pay

## 2021-01-11 ENCOUNTER — Emergency Department (HOSPITAL_COMMUNITY)
Admission: EM | Admit: 2021-01-11 | Discharge: 2021-01-12 | Disposition: A | Discharge status: Home / Self Care | Payer: Medicare HMO | Attending: Physician Assistant | Admitting: Physician Assistant

## 2021-01-11 DIAGNOSIS — Z5321 Procedure and treatment not carried out due to patient leaving prior to being seen by health care provider: Secondary | ICD-10-CM | POA: Insufficient documentation

## 2021-01-11 DIAGNOSIS — K5900 Constipation, unspecified: Secondary | ICD-10-CM | POA: Diagnosis not present

## 2021-01-11 DIAGNOSIS — H00015 Hordeolum externum left lower eyelid: Secondary | ICD-10-CM | POA: Diagnosis not present

## 2021-01-11 DIAGNOSIS — K625 Hemorrhage of anus and rectum: Secondary | ICD-10-CM | POA: Diagnosis not present

## 2021-01-11 LAB — CBC WITH DIFFERENTIAL/PLATELET
Abs Immature Granulocytes: 0.03 10*3/uL (ref 0.00–0.07)
Basophils Absolute: 0 10*3/uL (ref 0.0–0.1)
Basophils Relative: 0 %
Eosinophils Absolute: 0.1 10*3/uL (ref 0.0–0.5)
Eosinophils Relative: 2 %
HCT: 44.7 % (ref 39.0–52.0)
Hemoglobin: 15 g/dL (ref 13.0–17.0)
Immature Granulocytes: 1 %
Lymphocytes Relative: 31 %
Lymphs Abs: 1.6 10*3/uL (ref 0.7–4.0)
MCH: 33.2 pg (ref 26.0–34.0)
MCHC: 33.6 g/dL (ref 30.0–36.0)
MCV: 98.9 fL (ref 80.0–100.0)
Monocytes Absolute: 0.7 10*3/uL (ref 0.1–1.0)
Monocytes Relative: 14 %
Neutro Abs: 2.7 10*3/uL (ref 1.7–7.7)
Neutrophils Relative %: 52 %
Platelets: 200 10*3/uL (ref 150–400)
RBC: 4.52 MIL/uL (ref 4.22–5.81)
RDW: 13.1 % (ref 11.5–15.5)
WBC: 5.1 10*3/uL (ref 4.0–10.5)
nRBC: 0 % (ref 0.0–0.2)

## 2021-01-11 LAB — COMPREHENSIVE METABOLIC PANEL
ALT: 34 U/L (ref 0–44)
AST: 21 U/L (ref 15–41)
Albumin: 3.8 g/dL (ref 3.5–5.0)
Alkaline Phosphatase: 50 U/L (ref 38–126)
Anion gap: 7 (ref 5–15)
BUN: 18 mg/dL (ref 8–23)
CO2: 26 mmol/L (ref 22–32)
Calcium: 9.5 mg/dL (ref 8.9–10.3)
Chloride: 100 mmol/L (ref 98–111)
Creatinine, Ser: 1.73 mg/dL — ABNORMAL HIGH (ref 0.61–1.24)
GFR, Estimated: 40 mL/min — ABNORMAL LOW (ref >60)
Glucose, Bld: 128 mg/dL — ABNORMAL HIGH (ref 70–99)
Potassium: 4 mmol/L (ref 3.5–5.1)
Sodium: 133 mmol/L — ABNORMAL LOW (ref 135–145)
Total Bilirubin: 0.7 mg/dL (ref 0.3–1.2)
Total Protein: 6.3 g/dL — ABNORMAL LOW (ref 6.5–8.1)

## 2021-01-11 LAB — URINALYSIS, ROUTINE W REFLEX MICROSCOPIC
Bilirubin Urine: NEGATIVE
Glucose, UA: NEGATIVE mg/dL
Hgb urine dipstick: NEGATIVE
Ketones, ur: NEGATIVE mg/dL
Leukocytes,Ua: NEGATIVE
Nitrite: NEGATIVE
Protein, ur: NEGATIVE mg/dL
Specific Gravity, Urine: 1.015 (ref 1.005–1.030)
pH: 6 (ref 5.0–8.0)

## 2021-01-11 LAB — LIPASE, BLOOD: Lipase: 30 U/L (ref 11–51)

## 2021-01-11 NOTE — ED Provider Notes (Signed)
Emergency Medicine Provider Triage Evaluation Note  Howard Jackson , a 75 y.o. male  was evaluated in triage.  Pt complains of abdominal pain. He states he has a history of CVA and constipation. Notes increasing constipation the past week. Two days ago strained to have a BM and noticed BRB in the toilet. He went to his PCP and noted increased left sided abdominal pain as well so he was sent to the ED for evaluation. Denies fevers, n/v, CP, SOB.   Physical Exam  BP (!) 142/70   Pulse 69   Temp 98.1 F (36.7 C) (Oral)   Resp 16   SpO2 99%  Gen:   Awake, no distress   Resp:  Normal effort  MSK:   Moves extremities without difficulty  Other:  Protuberant abdomen that is soft. TTP noted to the left central abdomen.   Medical Decision Making  Medically screening exam initiated at 8:00 PM.  Appropriate orders placed.  Howard Jackson was informed that the remainder of the evaluation will be completed by another provider, this initial triage assessment does not replace that evaluation, and the importance of remaining in the ED until their evaluation is complete.   Rayna Sexton, PA-C 01/11/21 2001    Drenda Freeze, MD 01/11/21 [phone]

## 2021-01-11 NOTE — ED Triage Notes (Signed)
The pt is c/o rectal bleeding after being constipated  he saw his doctor yesterday

## 2021-01-12 NOTE — ED Notes (Signed)
Pt left AMA °

## 2021-01-24 DIAGNOSIS — L989 Disorder of the skin and subcutaneous tissue, unspecified: Secondary | ICD-10-CM | POA: Diagnosis not present

## 2021-01-27 ENCOUNTER — Other Ambulatory Visit: Payer: Self-pay | Admitting: Cardiovascular Disease

## 2021-01-29 DIAGNOSIS — I739 Peripheral vascular disease, unspecified: Secondary | ICD-10-CM | POA: Diagnosis not present

## 2021-01-29 DIAGNOSIS — M21612 Bunion of left foot: Secondary | ICD-10-CM | POA: Diagnosis not present

## 2021-01-29 DIAGNOSIS — R21 Rash and other nonspecific skin eruption: Secondary | ICD-10-CM | POA: Diagnosis not present

## 2021-01-29 DIAGNOSIS — M2042 Other hammer toe(s) (acquired), left foot: Secondary | ICD-10-CM | POA: Diagnosis not present

## 2021-02-06 DIAGNOSIS — L989 Disorder of the skin and subcutaneous tissue, unspecified: Secondary | ICD-10-CM | POA: Diagnosis not present

## 2021-03-04 DIAGNOSIS — I10 Essential (primary) hypertension: Secondary | ICD-10-CM | POA: Diagnosis not present

## 2021-03-11 DIAGNOSIS — G8929 Other chronic pain: Secondary | ICD-10-CM | POA: Diagnosis not present

## 2021-03-11 DIAGNOSIS — N401 Enlarged prostate with lower urinary tract symptoms: Secondary | ICD-10-CM | POA: Diagnosis not present

## 2021-03-11 DIAGNOSIS — I639 Cerebral infarction, unspecified: Secondary | ICD-10-CM | POA: Diagnosis not present

## 2021-03-11 DIAGNOSIS — K219 Gastro-esophageal reflux disease without esophagitis: Secondary | ICD-10-CM | POA: Diagnosis not present

## 2021-03-11 DIAGNOSIS — J4520 Mild intermittent asthma, uncomplicated: Secondary | ICD-10-CM | POA: Diagnosis not present

## 2021-03-11 DIAGNOSIS — I2510 Atherosclerotic heart disease of native coronary artery without angina pectoris: Secondary | ICD-10-CM | POA: Diagnosis not present

## 2021-03-11 DIAGNOSIS — E785 Hyperlipidemia, unspecified: Secondary | ICD-10-CM | POA: Diagnosis not present

## 2021-03-11 DIAGNOSIS — I10 Essential (primary) hypertension: Secondary | ICD-10-CM | POA: Diagnosis not present

## 2021-04-11 DIAGNOSIS — Z20822 Contact with and (suspected) exposure to covid-19: Secondary | ICD-10-CM | POA: Diagnosis not present

## 2021-04-11 DIAGNOSIS — R059 Cough, unspecified: Secondary | ICD-10-CM | POA: Diagnosis not present

## 2021-05-02 DIAGNOSIS — I639 Cerebral infarction, unspecified: Secondary | ICD-10-CM | POA: Diagnosis not present

## 2021-05-02 DIAGNOSIS — I10 Essential (primary) hypertension: Secondary | ICD-10-CM | POA: Diagnosis not present

## 2021-05-02 DIAGNOSIS — N401 Enlarged prostate with lower urinary tract symptoms: Secondary | ICD-10-CM | POA: Diagnosis not present

## 2021-05-02 DIAGNOSIS — E785 Hyperlipidemia, unspecified: Secondary | ICD-10-CM | POA: Diagnosis not present

## 2021-05-02 DIAGNOSIS — K219 Gastro-esophageal reflux disease without esophagitis: Secondary | ICD-10-CM | POA: Diagnosis not present

## 2021-05-02 DIAGNOSIS — I2510 Atherosclerotic heart disease of native coronary artery without angina pectoris: Secondary | ICD-10-CM | POA: Diagnosis not present

## 2021-05-02 DIAGNOSIS — J4520 Mild intermittent asthma, uncomplicated: Secondary | ICD-10-CM | POA: Diagnosis not present

## 2021-05-02 DIAGNOSIS — G8929 Other chronic pain: Secondary | ICD-10-CM | POA: Diagnosis not present

## 2021-05-06 ENCOUNTER — Other Ambulatory Visit: Payer: Self-pay | Admitting: Cardiovascular Disease

## 2021-05-06 DIAGNOSIS — I152 Hypertension secondary to endocrine disorders: Secondary | ICD-10-CM

## 2021-05-07 DIAGNOSIS — L089 Local infection of the skin and subcutaneous tissue, unspecified: Secondary | ICD-10-CM | POA: Diagnosis not present

## 2021-05-07 DIAGNOSIS — I10 Essential (primary) hypertension: Secondary | ICD-10-CM | POA: Diagnosis not present

## 2021-05-07 DIAGNOSIS — M25551 Pain in right hip: Secondary | ICD-10-CM | POA: Diagnosis not present

## 2021-05-07 DIAGNOSIS — M25512 Pain in left shoulder: Secondary | ICD-10-CM | POA: Diagnosis not present

## 2021-05-07 DIAGNOSIS — M542 Cervicalgia: Secondary | ICD-10-CM | POA: Diagnosis not present

## 2021-05-08 DIAGNOSIS — M19012 Primary osteoarthritis, left shoulder: Secondary | ICD-10-CM | POA: Diagnosis not present

## 2021-05-08 DIAGNOSIS — M50323 Other cervical disc degeneration at C6-C7 level: Secondary | ICD-10-CM | POA: Diagnosis not present

## 2021-05-08 DIAGNOSIS — M1611 Unilateral primary osteoarthritis, right hip: Secondary | ICD-10-CM | POA: Diagnosis not present

## 2021-05-08 DIAGNOSIS — M47819 Spondylosis without myelopathy or radiculopathy, site unspecified: Secondary | ICD-10-CM | POA: Diagnosis not present

## 2021-05-10 DIAGNOSIS — I69354 Hemiplegia and hemiparesis following cerebral infarction affecting left non-dominant side: Secondary | ICD-10-CM | POA: Diagnosis not present

## 2021-05-10 DIAGNOSIS — E2609 Other primary hyperaldosteronism: Secondary | ICD-10-CM | POA: Diagnosis not present

## 2021-05-10 DIAGNOSIS — I10 Essential (primary) hypertension: Secondary | ICD-10-CM | POA: Diagnosis not present

## 2021-05-30 DIAGNOSIS — R059 Cough, unspecified: Secondary | ICD-10-CM | POA: Diagnosis not present

## 2021-05-30 DIAGNOSIS — H109 Unspecified conjunctivitis: Secondary | ICD-10-CM | POA: Diagnosis not present

## 2021-05-30 DIAGNOSIS — Z20822 Contact with and (suspected) exposure to covid-19: Secondary | ICD-10-CM | POA: Diagnosis not present

## 2021-05-30 DIAGNOSIS — L989 Disorder of the skin and subcutaneous tissue, unspecified: Secondary | ICD-10-CM | POA: Diagnosis not present

## 2021-07-13 DIAGNOSIS — K219 Gastro-esophageal reflux disease without esophagitis: Secondary | ICD-10-CM | POA: Diagnosis not present

## 2021-07-13 DIAGNOSIS — N401 Enlarged prostate with lower urinary tract symptoms: Secondary | ICD-10-CM | POA: Diagnosis not present

## 2021-07-13 DIAGNOSIS — I10 Essential (primary) hypertension: Secondary | ICD-10-CM | POA: Diagnosis not present

## 2021-07-13 DIAGNOSIS — G8929 Other chronic pain: Secondary | ICD-10-CM | POA: Diagnosis not present

## 2021-07-13 DIAGNOSIS — E785 Hyperlipidemia, unspecified: Secondary | ICD-10-CM | POA: Diagnosis not present

## 2021-07-13 DIAGNOSIS — J4520 Mild intermittent asthma, uncomplicated: Secondary | ICD-10-CM | POA: Diagnosis not present

## 2021-07-13 DIAGNOSIS — I2510 Atherosclerotic heart disease of native coronary artery without angina pectoris: Secondary | ICD-10-CM | POA: Diagnosis not present

## 2021-07-13 DIAGNOSIS — I639 Cerebral infarction, unspecified: Secondary | ICD-10-CM | POA: Diagnosis not present

## 2021-07-29 ENCOUNTER — Other Ambulatory Visit: Payer: Self-pay | Admitting: Cardiovascular Disease

## 2021-07-29 DIAGNOSIS — I2510 Atherosclerotic heart disease of native coronary artery without angina pectoris: Secondary | ICD-10-CM

## 2021-07-29 DIAGNOSIS — G8191 Hemiplegia, unspecified affecting right dominant side: Secondary | ICD-10-CM

## 2021-08-02 DIAGNOSIS — M5416 Radiculopathy, lumbar region: Secondary | ICD-10-CM | POA: Diagnosis not present

## 2021-08-05 DIAGNOSIS — Z981 Arthrodesis status: Secondary | ICD-10-CM | POA: Diagnosis not present

## 2021-08-05 DIAGNOSIS — N401 Enlarged prostate with lower urinary tract symptoms: Secondary | ICD-10-CM | POA: Diagnosis not present

## 2021-08-05 DIAGNOSIS — G8194 Hemiplegia, unspecified affecting left nondominant side: Secondary | ICD-10-CM | POA: Diagnosis not present

## 2021-08-07 DIAGNOSIS — M5116 Intervertebral disc disorders with radiculopathy, lumbar region: Secondary | ICD-10-CM | POA: Diagnosis not present

## 2021-08-07 DIAGNOSIS — M4807 Spinal stenosis, lumbosacral region: Secondary | ICD-10-CM | POA: Diagnosis not present

## 2021-08-07 DIAGNOSIS — M48061 Spinal stenosis, lumbar region without neurogenic claudication: Secondary | ICD-10-CM | POA: Diagnosis not present

## 2021-08-07 DIAGNOSIS — M4726 Other spondylosis with radiculopathy, lumbar region: Secondary | ICD-10-CM | POA: Diagnosis not present

## 2021-08-07 DIAGNOSIS — M438X6 Other specified deforming dorsopathies, lumbar region: Secondary | ICD-10-CM | POA: Diagnosis not present

## 2021-08-09 DIAGNOSIS — M5416 Radiculopathy, lumbar region: Secondary | ICD-10-CM | POA: Diagnosis not present

## 2021-08-12 ENCOUNTER — Telehealth: Payer: Self-pay | Admitting: *Deleted

## 2021-08-12 NOTE — Telephone Encounter (Signed)
I s/w the pt and informed him that he will need an appt for pre op clearance. Pt is agreeable. Pt has been scheduled to see Richardson Dopp, [facility] 08/28/21 @ 2:20 pm. Pt's procedure is set for 09/02/21. I did offer an appt at the [place] location for 08/16/21, but pt states he has transportation issues and his daughter will have to come to the appt as well. Pt opts for the 08/28/21 appt @ 2:20 with [facility], PAC. Pt is aware this appt is at the [facility] location , pt has been given address for [place] has verbalized understanding to plan of cared. I will forward notes to [facility] for upcoming appt. Will send FYI to requesting office the pt has appt 08/28/21.

## 2021-08-12 NOTE — Telephone Encounter (Signed)
° °  Pre-operative Risk Assessment    Patient Name: Howard Jackson  DOB: 08-14-1945 MRN: [id number]      Request for Surgical Clearance    Procedure:   L4-5 TRANSLAMINAR EPIDURAL STEROID INJECTION  Date of Surgery:  Clearance 09/02/21                                 Surgeon:  DR. Kristeen Miss Surgeon's Group or Practice Name:  [place] Phone number:  [phone] Fax number:  [phone] ATTN: JESSICA   Type of Clearance Requested:   - Medical  - Pharmacy:  Hold Clopidogrel (Plavix)     Type of Anesthesia:   IV SEDATION   Additional requests/questions:    Jiles Prows   08/12/2021, 1:32 PM

## 2021-08-12 NOTE — Telephone Encounter (Signed)
Primary Cardiologist:Mihai Croitoru, MD  Chart reviewed as part of pre-operative protocol coverage. Because of Howard Jackson past medical history and time since last visit, he/she will require a follow-up visit in order to better assess preoperative cardiovascular risk.  Pre-op covering staff: - Please schedule appointment and call patient to inform them. - Please contact requesting surgeon's office via preferred method (i.e, phone, fax) to inform them of need for appointment prior to surgery.  If applicable, this message will also be routed to pharmacy pool and/or primary cardiologist for input on holding anticoagulant/antiplatelet agent as requested below so that this information is available at time of patient's appointment.   Deberah Pelton, NP  08/12/2021, 3:35 PM

## 2021-08-14 DIAGNOSIS — Z8673 Personal history of transient ischemic attack (TIA), and cerebral infarction without residual deficits: Secondary | ICD-10-CM | POA: Diagnosis not present

## 2021-08-14 DIAGNOSIS — M47816 Spondylosis without myelopathy or radiculopathy, lumbar region: Secondary | ICD-10-CM | POA: Diagnosis not present

## 2021-08-14 DIAGNOSIS — M4800 Spinal stenosis, site unspecified: Secondary | ICD-10-CM | POA: Diagnosis not present

## 2021-08-16 ENCOUNTER — Ambulatory Visit (HOSPITAL_BASED_OUTPATIENT_CLINIC_OR_DEPARTMENT_OTHER): Payer: Medicare HMO | Admitting: General Practice

## 2021-08-22 DIAGNOSIS — L989 Disorder of the skin and subcutaneous tissue, unspecified: Secondary | ICD-10-CM | POA: Diagnosis not present

## 2021-08-22 DIAGNOSIS — K5900 Constipation, unspecified: Secondary | ICD-10-CM | POA: Diagnosis not present

## 2021-08-22 DIAGNOSIS — R6883 Chills (without fever): Secondary | ICD-10-CM | POA: Diagnosis not present

## 2021-08-27 NOTE — Telephone Encounter (Signed)
Nicholes Rough, PA-C will be seeing him off of my schedule 2/15. I will fwd to her as FYI. Richardson Dopp, PA-C    08/27/2021 1:11 PM

## 2021-08-28 ENCOUNTER — Ambulatory Visit (INDEPENDENT_AMBULATORY_CARE_PROVIDER_SITE_OTHER): Payer: Medicare HMO | Admitting: Physician Assistant

## 2021-08-28 ENCOUNTER — Encounter: Payer: Self-pay | Admitting: Physician Assistant

## 2021-08-28 ENCOUNTER — Other Ambulatory Visit: Payer: Self-pay

## 2021-08-28 VITALS — BP 122/50 | HR 60 | Ht 67.0 in

## 2021-08-28 DIAGNOSIS — E785 Hyperlipidemia, unspecified: Secondary | ICD-10-CM | POA: Diagnosis not present

## 2021-08-28 DIAGNOSIS — G8114 Spastic hemiplegia affecting left nondominant side: Secondary | ICD-10-CM

## 2021-08-28 DIAGNOSIS — I2510 Atherosclerotic heart disease of native coronary artery without angina pectoris: Secondary | ICD-10-CM | POA: Diagnosis not present

## 2021-08-28 DIAGNOSIS — I10 Essential (primary) hypertension: Secondary | ICD-10-CM | POA: Diagnosis not present

## 2021-08-28 DIAGNOSIS — I739 Peripheral vascular disease, unspecified: Secondary | ICD-10-CM

## 2021-08-28 NOTE — Patient Instructions (Signed)
Medication Instructions:   Your physician recommends that you continue on your current medications as directed. Please refer to the Current Medication list given to you today.   *If you need a refill on your cardiac medications before your next appointment, please call your pharmacy*    Follow-Up: At [facility], you and your health needs are our priority.  As part of our continuing mission to provide you with exceptional heart care, we have created designated Provider Care Teams.  These Care Teams include your primary Cardiologist (physician) and Advanced Practice Providers (APPs -  Physician Assistants and Nurse Practitioners) who all work together to provide you with the care you need, when you need it.  We recommend signing up for the patient portal called "MyChart".  Sign up information is provided on this After Visit Summary.  MyChart is used to connect with patients for Virtual Visits (Telemedicine).  Patients are able to view lab/test results, encounter notes, upcoming appointments, etc.  Non-urgent messages can be sent to your provider as well.   To learn more about what you can do with MyChart, go to [email or web address].    Your next appointment:   3 month(s)  The format for your next appointment:   In Person  Provider:   Sanda Klein, MD

## 2021-08-28 NOTE — Progress Notes (Signed)
Office Visit    Patient Name: Howard Jackson Date of Encounter: 08/28/2021  PCP:  Kristen Loader, [facility]  Cardiologist:  Sanda Klein, MD  Advanced Practice Provider:  No care team member to display Electrophysiologist:  None   HPI    Howard Jackson is a 76 y.o. male with a hx of CAD, PAD, hypertension related to an adrenal tumor, hyperlipidemia presents today for hospital follow-up and pre-op clearance for L4-5 translaminar epidural steroid injection.    He was last seen 02/03/2020 and at that time his blood pressure was well controlled. He was keeping a daily log and and his blood pressure is usually around 130/70.  He had no cardiovascular complaints but at this time is having a lot of chronic pain.  ZIO monitor showed normal sinus rhythm with occasional but brief episodes of paroxysmal atrial tachycardia.  No atrial fibrillation.  This patient denied chest pain at rest and with exertion, dyspnea, orthopnea, paroxysmal nocturnal dyspnea, syncope, palpitations, new focal neurologic defect, intermittent claudication, lower extremity edema, unexplained weight gain, cough, hemoptysis or wheezing.  On 01/11/2021 he was seen in the emergency room for abdominal pain.  He noted constipation for the past week.  He also complained of some rectal bleeding.  It looks like the patient left AMA.  Today, he is here for preop clearance for an epidural steroid injection.  He shares with me that he has not had any cardiovascular symptoms.  He denies chest pain, shortness of breath, dizziness/lightheadedness, syncope and presyncope.  He denies any fluttering in his chest or skipped beats.  He does not have any swelling in his lower legs.  He does endorse some positional vertigo which he has been treated for in the past.  When reviewing his meds he is able to take care of himself however, he is unable to stand due to his stroke back in 1999.  He does live by  himself and does not trust any assist devices because he has fallen in the past using a cane.  He crawls around his house in order to get from room to room.  He does have 3 daughters however they all are busy with their own lives and have not assisted him on a consistent basis.  He has tried to get some help before with cooking and cleaning through his insurance but he has been denied in the past.  We have reached out to our social worker Howard Jackson to try and provide him with assistance.  Since he does not meet METS for clearance I will discuss with Dr. Sallyanne Kuster.  He has already started to hold his Plavix per his surgeon's request since his surgery is on 09/02/2021 and he would need to hold for 5 days.  Reports no shortness of breath nor dyspnea on exertion. Reports no chest pain, pressure, or tightness. No edema, orthopnea, PND. Reports no palpitations.    Past Medical History    Past Medical History:  Diagnosis Date   Adrenal gland disorder ([place])    tumor present - no change-  no recent increase     Arthritis    Benign pheochromocytoma of right adrenal gland 06/23/2014   Never had histological diagnosis, but reportedly initially diagnosed in [place]    left foot   Complication of anesthesia    "died on the table during cervical fusion" '89 - believed to be d/t "too much medication" to treat HTN and  was later dx with pheochromocytoma   Coronary artery disease    cleared for surg. by Dr. Gwenlyn Found - [facility]   H/O hiatal hernia    Hammer toe    Herniated lumbar intervertebral disc    History of anxiety    Hyperlipidemia    Hypertension    Neurogenic bladder    Neuromuscular disorder ([place])    carpal tunnel problem, even after surgery release    Peripheral arterial disease ([place])    Primary aldosteronism ([place])    Stroke ([place])    in [place], L sided weakness, wears a brace on L leg & uses cane   Past Surgical History:  Procedure Laterality Date   ANTERIOR APPROACH HEMI HIP  ARTHROPLASTY Left 04/15/2018   Procedure: ANTERIOR APPROACH HEMI HIP ARTHROPLASTY;  Surgeon: Rod Can, MD;  Location: [facility];  Service: Orthopedics;  Laterality: Left;   CARPAL TUNNEL RELEASE     bilateral    CERVICAL FUSION     CORONARY ANGIOPLASTY WITH STENT PLACEMENT     pt reports having 5 stents in his heart-2011   HERNIA REPAIR     INGUINAL HERNIA REPAIR Left 11/18/2012   Procedure: HERNIA REPAIR INGUINAL ADULT;  Surgeon: Harl Bowie, MD;  Location: [facility];  Service: General;  Laterality: Left;   INSERTION OF MESH Left 11/18/2012   Procedure: INSERTION OF MESH;  Surgeon: Harl Bowie, MD;  Location: [facility];  Service: General;  Laterality: Left;    Allergies  Allergies  Allergen Reactions   Elavil [Amitriptyline Hcl] Hypertension   Sulfa Antibiotics Other (See Comments)    Constipation  Other reaction(s): Other (See Comments) Constipation     EKGs/Labs/Other Studies Reviewed:   The following studies were reviewed today:  Long-term Zio monitor 12/21/2019  The dominant rhythm is normal sinus, with normal circadian variation. There are no severe pauses or severe bradycardia There are occasional brief episodes of nonsustained atrial tachycardia, up to a maximum of 13 beats (5.6 s) There is no significant ventricular arrhythmia.   Mildly abnormal long-term arrhythmia monitor due to occasional brief nonsustained atrial tachycardia. No atrial fibrillation is seen.  EKG:  EKG is  ordered today.  The ekg ordered today demonstrates NSR.  Recent Labs: 01/11/2021: ALT 34; BUN 18; Creatinine, Ser 1.73; Hemoglobin 15.0; Platelets 200; Potassium 4.0; Sodium 133  Recent Lipid Panel    Component Value Date/Time   CHOL 156 02/03/2020 1450   TRIG 52 02/03/2020 1450   HDL 49 02/03/2020 1450   CHOLHDL 3.2 02/03/2020 1450   CHOLHDL 3.1 05/22/2016 1435   VLDL 15 05/22/2016 1435   LDLCALC 96 02/03/2020 1450     Home Medications   Current Meds  Medication Sig   aspirin  325 MG tablet Take 650 mg by mouth once.   CITRUS BERGAMOT PO Take by mouth.   clopidogrel (PLAVIX) 75 MG tablet TAKE 1 TABLET BY MOUTH EVERY DAY   finasteride (PROSCAR) 5 MG tablet Take 5 mg by mouth daily.   FLOVENT HFA 110 MCG/ACT inhaler Inhale 1 puff into the lungs 3 (three) times daily.    labetalol (NORMODYNE) 300 MG tablet TAKE 1 TABLET BY MOUTH 2 TIMES DAILY.   lactobacillus acidophilus (BACID) TABS tablet Take 2 tablets by mouth 2 (two) times daily.   mupirocin ointment (BACTROBAN) 2 % Apply 1 application topically as needed.   NIFEdipine (ADALAT CC) 30 MG 24 hr tablet Take 30 mg every morning   NIFEdipine (ADALAT CC) 60 MG 24 hr  tablet Take 1 tablet (60 mg total) by mouth every evening. (use other 30 mg Rx in the MORNING ONLY)   pantoprazole (PROTONIX) 40 MG tablet 40 mg as needed (heartburn).   rosuvastatin (CRESTOR) 20 MG tablet TOME UNA TABLETA TODOS LOS DIAS (Patient taking differently: Take 20 mg by mouth daily.)   senna-docusate (SENOKOT-S) 8.6-50 MG tablet Take 3 tablets by mouth every evening.   spironolactone (ALDACTONE) 100 MG tablet Take 1 tablet (100 mg total) by mouth daily.   tamsulosin (FLOMAX) 0.4 MG CAPS capsule Take 1 capsule (0.4 mg total) by mouth daily.   tiZANidine (ZANAFLEX) 4 MG tablet TAKE 2 TABLETS BY MOUTH AT BEDTIME AS NEEDED     Review of Systems      All other systems reviewed and are otherwise negative except as noted above.  Physical Exam    VS:  BP (!) 122/50 (BP Location: Right Arm, Patient Position: Sitting, Cuff Size: Normal)    Pulse 60    Ht 5\' 7"  (1.702 m)    SpO2 97%    BMI 24.14 kg/m  , BMI Body mass index is 24.14 kg/m.  Wt Readings from Last 3 Encounters:  01/11/21 154 lb 1.6 oz (69.9 kg)  02/03/20 154 lb (69.9 kg)  12/16/19 161 lb (73 kg)     GEN: Well nourished, well developed, in no acute distress. HEENT: normal. Neck: Supple, no JVD, carotid bruits, or masses. Cardiac: RRR, faint murmur, rubs, or gallops. No clubbing,  cyanosis, edema.  Radials/PT 2+ and equal bilaterally.  Respiratory:  Respirations regular and unlabored, clear to auscultation bilaterally. GI: Soft, nontender, nondistended. MS: No deformity or atrophy. Skin: Warm and dry, no rash. Neuro:  Strength and sensation are intact. Psych: Normal affect.  Assessment & Plan    Preop cardiac clearance Mr. Matos perioperative risk of a major cardiac event is 6.6% according to the Revised Cardiac Risk Index (RCRI).  Therefore, he is at high risk for perioperative complications.   His functional capacity is poor at 3.08 METs according to the Duke Activity Status Index (DASI). Recommendations: The patient is at high risk for perioperative cardiac complications and is at a low functional capacity.  However, further testing will not change how his cardiac status is managed.  Proceed with surgery at high risk if there are no other options for treatment. Antiplatelet and/or Anticoagulation Recommendations: Clopidogrel (Plavix) can be held for 5 days prior to his surgery and resumed as soon as possible post op.  CAD -No chest pain or shortness of breath -He has already been taken off of his Plavix which she is mostly on for his stroke back in 1999 -No need to pursued an ischemic work-up at this time  PAD  -he has no lower extremity edema present today -Unsure about claudication since he does not walk  Hypertension -Blood pressure is well controlled today -He does have a blood pressure cuff at home and it is usually 814G to 818H systolic -Continue current medication regimen  Hyperlipidemia -LDL is above goal at 92 which was done January 2022 -He is due for a repeat lipid panel and will need to get one on his next visit -For now continue Crestor 20 mg daily  Left hemiparesis -This has been severely debilitating -He has not been able to walk and is crawling around his house -He has family members but does not get much assistance from  them -Reached out to Howard Jackson with social work to see if there are any resources  we can provide -He has discussed the need for assistance with [facility] but was denied any help   Disposition: Follow up 3 months with Sanda Klein, MD or APP.  Signed, Elgie Collard, PA-C 08/28/2021, 3:47 PM [facility]

## 2021-08-29 ENCOUNTER — Telehealth (HOSPITAL_COMMUNITY): Payer: Self-pay | Admitting: Licensed Clinical Social Worker

## 2021-08-29 ENCOUNTER — Telehealth (HOSPITAL_COMMUNITY): Payer: Self-pay

## 2021-08-29 NOTE — Telephone Encounter (Signed)
CSW completed PCS referral and signed by provider. CSW faxed application to [facility] for assessment of services. Raquel Sarna, [place], [place]

## 2021-08-29 NOTE — Telephone Encounter (Signed)
CSW and Student Intern call pt regarding [address] office referral for CSW assistance. Pt suffered from a stroke in 1999 and has limited mobility due to left side paralyzation. Pt fell twice due to instability and does not trust assistance devices. He uses a motorized wheelchair and expressed needing batteries for the wheelchair. Pt discussed with CSW needing food/home assistance - meal prep and light housework. Pt mentions daughters as a support system, but they are getting "busy with their own lives." Pt confirmed his medicaid benefits which will provide personal care assistance with meal prep and light housekeeping and any other identified needs. Pt appeared to be grateful for the conversation and referral for home services. Raquel Sarna CSW will follow up with referral process for PCA services and any other needs. No other needs identified at this time.   Su Grand, MSW student intern [place] [phone]

## 2021-08-30 NOTE — Addendum Note (Signed)
Addended by: Janan Halter F on: 08/30/2021 07:41 AM   Modules accepted: Orders

## 2021-09-02 ENCOUNTER — Telehealth (HOSPITAL_COMMUNITY): Payer: Self-pay | Admitting: Licensed Clinical Social Worker

## 2021-09-02 NOTE — Telephone Encounter (Signed)
CSW received return fax from [facility] stating that the patient's MID number is invalid. CSW contacted patient to confirm number. Message left and awaiting return call. Raquel Sarna, [place], [place]

## 2021-09-02 NOTE — Telephone Encounter (Signed)
Patient returned call and provided Medicaid ID number for [facility] application. CSW completed and faxed to [facility] for evaluation. CSW available as needed. Raquel Sarna, [place], [place]

## 2021-09-02 NOTE — Addendum Note (Signed)
Addended by: Briant Cedar on: 09/02/2021 03:55 PM   Modules accepted: Orders

## 2021-09-05 DIAGNOSIS — M5116 Intervertebral disc disorders with radiculopathy, lumbar region: Secondary | ICD-10-CM | POA: Diagnosis not present

## 2021-09-05 DIAGNOSIS — M5416 Radiculopathy, lumbar region: Secondary | ICD-10-CM | POA: Diagnosis not present

## 2021-10-09 ENCOUNTER — Encounter (HOSPITAL_COMMUNITY): Payer: Self-pay | Admitting: *Deleted

## 2021-10-09 ENCOUNTER — Emergency Department (HOSPITAL_COMMUNITY)
Admission: EM | Admit: 2021-10-09 | Discharge: 2021-10-09 | Disposition: A | Discharge status: Home / Self Care | Payer: Medicare HMO | Attending: Emergency Medicine | Admitting: Emergency Medicine

## 2021-10-09 ENCOUNTER — Other Ambulatory Visit: Payer: Self-pay

## 2021-10-09 ENCOUNTER — Emergency Department (HOSPITAL_COMMUNITY): Payer: Medicare HMO

## 2021-10-09 DIAGNOSIS — R1013 Epigastric pain: Secondary | ICD-10-CM | POA: Diagnosis not present

## 2021-10-09 DIAGNOSIS — R109 Unspecified abdominal pain: Secondary | ICD-10-CM | POA: Diagnosis present

## 2021-10-09 DIAGNOSIS — I2510 Atherosclerotic heart disease of native coronary artery without angina pectoris: Secondary | ICD-10-CM | POA: Insufficient documentation

## 2021-10-09 DIAGNOSIS — I10 Essential (primary) hypertension: Secondary | ICD-10-CM | POA: Diagnosis not present

## 2021-10-09 DIAGNOSIS — K921 Melena: Secondary | ICD-10-CM | POA: Insufficient documentation

## 2021-10-09 DIAGNOSIS — R1084 Generalized abdominal pain: Secondary | ICD-10-CM | POA: Diagnosis not present

## 2021-10-09 DIAGNOSIS — K5730 Diverticulosis of large intestine without perforation or abscess without bleeding: Secondary | ICD-10-CM | POA: Diagnosis not present

## 2021-10-09 DIAGNOSIS — R1010 Upper abdominal pain, unspecified: Secondary | ICD-10-CM | POA: Diagnosis not present

## 2021-10-09 DIAGNOSIS — K5900 Constipation, unspecified: Secondary | ICD-10-CM | POA: Diagnosis not present

## 2021-10-09 DIAGNOSIS — K429 Umbilical hernia without obstruction or gangrene: Secondary | ICD-10-CM | POA: Diagnosis not present

## 2021-10-09 LAB — CBC WITH DIFFERENTIAL/PLATELET
Abs Immature Granulocytes: 0.07 10*3/uL (ref 0.00–0.07)
Basophils Absolute: 0 10*3/uL (ref 0.0–0.1)
Basophils Relative: 0 %
Eosinophils Absolute: 0.1 10*3/uL (ref 0.0–0.5)
Eosinophils Relative: 1 %
HCT: 46.5 % (ref 39.0–52.0)
Hemoglobin: 15.8 g/dL (ref 13.0–17.0)
Immature Granulocytes: 1 %
Lymphocytes Relative: 24 %
Lymphs Abs: 1.6 10*3/uL (ref 0.7–4.0)
MCH: 33.8 pg (ref 26.0–34.0)
MCHC: 34 g/dL (ref 30.0–36.0)
MCV: 99.4 fL (ref 80.0–100.0)
Monocytes Absolute: 0.8 10*3/uL (ref 0.1–1.0)
Monocytes Relative: 12 %
Neutro Abs: 4.2 10*3/uL (ref 1.7–7.7)
Neutrophils Relative %: 62 %
Platelets: 196 10*3/uL (ref 150–400)
RBC: 4.68 MIL/uL (ref 4.22–5.81)
RDW: 12.9 % (ref 11.5–15.5)
WBC: 6.7 10*3/uL (ref 4.0–10.5)
nRBC: 0 % (ref 0.0–0.2)

## 2021-10-09 LAB — COMPREHENSIVE METABOLIC PANEL
ALT: 34 U/L (ref 0–44)
AST: 28 U/L (ref 15–41)
Albumin: 3.9 g/dL (ref 3.5–5.0)
Alkaline Phosphatase: 55 U/L (ref 38–126)
Anion gap: 10 (ref 5–15)
BUN: 30 mg/dL — ABNORMAL HIGH (ref 8–23)
CO2: 23 mmol/L (ref 22–32)
Calcium: 9.6 mg/dL (ref 8.9–10.3)
Chloride: 98 mmol/L (ref 98–111)
Creatinine, Ser: 1.09 mg/dL (ref 0.61–1.24)
GFR, Estimated: >60 mL/min (ref >60)
Glucose, Bld: 109 mg/dL — ABNORMAL HIGH (ref 70–99)
Potassium: 4.3 mmol/L (ref 3.5–5.1)
Sodium: 131 mmol/L — ABNORMAL LOW (ref 135–145)
Total Bilirubin: 0.8 mg/dL (ref 0.3–1.2)
Total Protein: 6.6 g/dL (ref 6.5–8.1)

## 2021-10-09 LAB — URINALYSIS, ROUTINE W REFLEX MICROSCOPIC
Bilirubin Urine: NEGATIVE
Glucose, UA: NEGATIVE mg/dL
Hgb urine dipstick: NEGATIVE
Ketones, ur: NEGATIVE mg/dL
Leukocytes,Ua: NEGATIVE
Nitrite: NEGATIVE
Protein, ur: NEGATIVE mg/dL
Specific Gravity, Urine: 1.015 (ref 1.005–1.030)
pH: 6 (ref 5.0–8.0)

## 2021-10-09 LAB — POC OCCULT BLOOD, ED: Fecal Occult Bld: NEGATIVE

## 2021-10-09 LAB — LIPASE, BLOOD: Lipase: 50 U/L (ref 11–51)

## 2021-10-09 NOTE — ED Provider Notes (Signed)
? ?Emergency Department Provider Note ? ? ?I have reviewed the triage vital signs and the nursing notes. ? ? ?HISTORY ? ?Chief Complaint ?Abdominal Pain ? ? ?HPI ?Howard Jackson is a 76 y.o. male presents emergency room for evaluation of abdominal pain which is diffuse along with some dark stools.  Notes that symptoms been intermittent over the past 2 months.  He called to schedule with his GI team but they cannot see him until July.  With continued symptoms he presents today for evaluation.  No fevers or chills.  No diarrhea.  No vomiting or hematemesis.  No chest pain or shortness of breath. ? ? ?Past Medical History:  ?Diagnosis Date  ? Adrenal gland disorder ([place])   ? tumor present - no change-  no recent increase    ? Arthritis   ? Benign pheochromocytoma of right adrenal gland 06/23/2014  ? Never had histological diagnosis, but reportedly initially diagnosed in 1989  ? Bunion   ? left foot  ? Complication of anesthesia   ? "died on the table during cervical fusion" '89 - believed to be d/t "too much medication" to treat HTN and was later dx with pheochromocytoma  ? Coronary artery disease   ? cleared for surg. by Dr. Gwenlyn Found - [facility]  ? H/O hiatal hernia   ? Hammer toe   ? Herniated lumbar intervertebral disc   ? History of anxiety   ? Hyperlipidemia   ? Hypertension   ? Neurogenic bladder   ? Neuromuscular disorder ([place])   ? carpal tunnel problem, even after surgery release   ? Peripheral arterial disease ([place])   ? Primary aldosteronism ([place])   ? Stroke [facility])   ? in [place], L sided weakness, wears a brace on L leg & uses cane  ? ? ?Review of Systems ? ?Constitutional: No fever/chills ?Eyes: No visual changes. ?ENT: No sore throat. ?Cardiovascular: Denies chest pain. ?Respiratory: Denies shortness of breath. ?Gastrointestinal: Positive abdominal pain.  No nausea, no vomiting.  No diarrhea.  No constipation. ?Genitourinary: Negative for dysuria. ?Musculoskeletal: Negative for back pain. ?Skin: Negative for  rash. ?Neurological: Negative for headaches, focal weakness or numbness. ? ?____________________________________________ ? ? ?PHYSICAL EXAM: ? ?VITAL SIGNS: ?ED Triage Vitals  ?Enc Vitals Group  ?   BP 10/09/21 1529 (!) 154/81  ?   Pulse Rate 10/09/21 1529 67  ?   Resp 10/09/21 1529 18  ?   Temp 10/09/21 1529 98 ?F (36.7 ?C)  ?   Temp src --   ?   SpO2 10/09/21 1529 99 %  ?   Weight 10/09/21 1520 154 lb 1.6 oz (69.9 kg)  ?   Height 10/09/21 1520 '5\' 7"'$  (1.702 m)  ? ?Constitutional: Alert and oriented. Well appearing and in no acute distress. ?Eyes: Conjunctivae are normal. ?Head: Atraumatic. ?Nose: No congestion/rhinnorhea. ?Mouth/Throat: Mucous membranes are moist.   ?Neck: No stridor.   ?Cardiovascular: Normal rate, regular rhythm. Good peripheral circulation. Grossly normal heart sounds.   ?Respiratory: Normal respiratory effort.  No retractions. Lungs CTAB. ?Gastrointestinal: Soft and nontender. No distention. Rectal exam performed with patient's verbal consent and nurse chaperone. No gross blood or melena. No visible hemorrhoids. ?Musculoskeletal: No lower extremity tenderness nor edema. No gross deformities of extremities. ?Neurologic:  Normal speech and language. No gross focal neurologic deficits are appreciated.  ?Skin:  Skin is warm, dry and intact. No rash noted. ? ?____________________________________________ ?  ?LABS ?(all labs ordered are listed, but only abnormal results are displayed) ? ?  Labs Reviewed  ?COMPREHENSIVE METABOLIC PANEL - Abnormal; Notable for the following components:  ?    Result Value  ? Sodium 131 (*)   ? Glucose, Bld 109 (*)   ? BUN 30 (*)   ? All other components within normal limits  ?CBC WITH DIFFERENTIAL/PLATELET  ?LIPASE, BLOOD  ?URINALYSIS, ROUTINE W REFLEX MICROSCOPIC  ?POC OCCULT BLOOD, ED  ? ?____________________________________________ ? ? ?PROCEDURES ? ?Procedure(s) performed:  ? ?Procedures ? ?None  ?____________________________________________ ? ? ?INITIAL IMPRESSION /  ASSESSMENT AND PLAN / ED COURSE ? ?Pertinent labs & imaging results that were available during my care of the patient were reviewed by me and considered in my medical decision making (see chart for details). ?  ?This patient is Presenting for Evaluation of abdominal pain, which does require a range of treatment options, and is a complaint that involves a high risk of morbidity and mortality. ? ?The Differential Diagnoses includes but is not exclusive to acute cholecystitis, intrathoracic causes for epigastric abdominal pain, gastritis, duodenitis, pancreatitis, small bowel or large bowel obstruction, abdominal aortic aneurysm, hernia, gastritis, etc. ? ?  ?Clinical Laboratory Tests Ordered, included Hemoccult negative.  No anemia.  No leukocytosis.  Creatinine normal.  Lipase negative.  No hematuria. ? ?Radiologic Tests Ordered, included CT abdomen/pelvis. I independently interpreted the images and agree with radiology interpretation. ? ? ?Social Determinants of Health Risk patient is a non-smoker.  ? ?Medical Decision Making: Summary:  ?Patient presents emergency department abdominal pain and dark stools.  Hemoccult negative here and CT shows no acute findings.  Patient feeling overall well and hemodynamically stable.  Plan for outpatient GI follow-up. ? ?____________________________________________ ? ?FINAL CLINICAL IMPRESSION(S) / ED DIAGNOSES ? ?Final diagnoses:  ?Epigastric pain  ? ? ?Note:  This document was prepared using Dragon voice recognition software and may include unintentional dictation errors. ? ?Nanda Quinton, MD, FACEP ?Emergency Medicine ? ?  ?Margette Fast, MD ?10/12/21 [phone] ? ?

## 2021-10-09 NOTE — Discharge Instructions (Signed)

## 2021-10-09 NOTE — ED Notes (Signed)
Patient transported to CT 

## 2021-10-09 NOTE — ED Provider Triage Note (Signed)
Emergency Medicine Provider Triage Evaluation Note ? ?Howard Jackson , a 76 y.o. male  was evaluated in triage.  Pt complains of rectal bleeding.  He has noticed black stools for about 3 months.  He says that the blood is present in his stool.  Not present when he wipes.  He says he was diagnosed with a duodenal ulcer and back in the 80s.  He still has epigastric abdominal pain associated with this.  He thinks this is worsened lately.  He has been to see gastroenterology but he cannot get in for several month so he went to come here to get assessed. ? ?Review of Systems  ?Positive: Abdominal pain, rectal bleeding ?Negative:  ? ?Physical Exam  ?BP (!) 154/81   Pulse 67   Temp 98 ?F (36.7 ?C)   Resp 18   Ht '5\' 7"'$  (1.702 m)   Wt 69.9 kg   SpO2 99%   BMI 24.14 kg/m?  ?Gen:   Awake, no distress   ?Resp:  Normal effort  ?MSK:   Moves extremities without difficulty  ?Other:  Abdomen soft, epigastric tenderness ? ?Medical Decision Making  ?Medically screening exam initiated at 3:31 PM.  Appropriate orders placed.  Jeramie Scogin was informed that the remainder of the evaluation will be completed by another provider, this initial triage assessment does not replace that evaluation, and the importance of remaining in the ED until their evaluation is complete. ? ? ?  ?Adolphus Birchwood, PA-C ?10/09/21 1533 ? ?

## 2021-10-09 NOTE — ED Triage Notes (Signed)
The pt arrived by gems from home  the pt has had abd pain and dark stools for over  2 months he was referred to a gi  doc tort his appointment is not until July and he did not wany to wait that long so he  came in today for a check alert  w/c from ems ?

## 2021-10-10 DIAGNOSIS — G8194 Hemiplegia, unspecified affecting left nondominant side: Secondary | ICD-10-CM | POA: Diagnosis not present

## 2021-10-10 DIAGNOSIS — I69354 Hemiplegia and hemiparesis following cerebral infarction affecting left non-dominant side: Secondary | ICD-10-CM | POA: Diagnosis not present

## 2021-10-10 DIAGNOSIS — I2510 Atherosclerotic heart disease of native coronary artery without angina pectoris: Secondary | ICD-10-CM | POA: Diagnosis not present

## 2021-10-10 DIAGNOSIS — I10 Essential (primary) hypertension: Secondary | ICD-10-CM | POA: Diagnosis not present

## 2021-10-27 ENCOUNTER — Other Ambulatory Visit: Payer: Self-pay | Admitting: Cardiovascular Disease

## 2021-10-27 DIAGNOSIS — G8191 Hemiplegia, unspecified affecting right dominant side: Secondary | ICD-10-CM

## 2021-10-27 DIAGNOSIS — I2510 Atherosclerotic heart disease of native coronary artery without angina pectoris: Secondary | ICD-10-CM

## 2021-11-01 DIAGNOSIS — M4800 Spinal stenosis, site unspecified: Secondary | ICD-10-CM | POA: Diagnosis not present

## 2021-11-01 DIAGNOSIS — G8194 Hemiplegia, unspecified affecting left nondominant side: Secondary | ICD-10-CM | POA: Diagnosis not present

## 2021-11-01 DIAGNOSIS — M5126 Other intervertebral disc displacement, lumbar region: Secondary | ICD-10-CM | POA: Diagnosis not present

## 2021-11-01 DIAGNOSIS — M62838 Other muscle spasm: Secondary | ICD-10-CM | POA: Diagnosis not present

## 2021-11-01 DIAGNOSIS — Z981 Arthrodesis status: Secondary | ICD-10-CM | POA: Diagnosis not present

## 2021-11-01 DIAGNOSIS — Z8673 Personal history of transient ischemic attack (TIA), and cerebral infarction without residual deficits: Secondary | ICD-10-CM | POA: Diagnosis not present

## 2021-11-01 DIAGNOSIS — M47816 Spondylosis without myelopathy or radiculopathy, lumbar region: Secondary | ICD-10-CM | POA: Diagnosis not present

## 2021-11-01 DIAGNOSIS — I69354 Hemiplegia and hemiparesis following cerebral infarction affecting left non-dominant side: Secondary | ICD-10-CM | POA: Diagnosis not present

## 2021-11-01 DIAGNOSIS — G8929 Other chronic pain: Secondary | ICD-10-CM | POA: Diagnosis not present

## 2021-11-11 DIAGNOSIS — M542 Cervicalgia: Secondary | ICD-10-CM | POA: Diagnosis not present

## 2021-11-11 DIAGNOSIS — M545 Low back pain, unspecified: Secondary | ICD-10-CM | POA: Diagnosis not present

## 2021-11-11 DIAGNOSIS — W19XXXA Unspecified fall, initial encounter: Secondary | ICD-10-CM | POA: Diagnosis not present

## 2021-11-20 DIAGNOSIS — I69354 Hemiplegia and hemiparesis following cerebral infarction affecting left non-dominant side: Secondary | ICD-10-CM | POA: Diagnosis not present

## 2021-11-20 DIAGNOSIS — M546 Pain in thoracic spine: Secondary | ICD-10-CM | POA: Diagnosis not present

## 2021-11-20 DIAGNOSIS — M5416 Radiculopathy, lumbar region: Secondary | ICD-10-CM | POA: Diagnosis not present

## 2021-11-20 DIAGNOSIS — E785 Hyperlipidemia, unspecified: Secondary | ICD-10-CM | POA: Diagnosis not present

## 2021-11-20 DIAGNOSIS — M4180 Other forms of scoliosis, site unspecified: Secondary | ICD-10-CM | POA: Diagnosis not present

## 2021-11-20 DIAGNOSIS — I10 Essential (primary) hypertension: Secondary | ICD-10-CM | POA: Diagnosis not present

## 2021-11-20 DIAGNOSIS — J4520 Mild intermittent asthma, uncomplicated: Secondary | ICD-10-CM | POA: Diagnosis not present

## 2021-11-20 DIAGNOSIS — M48062 Spinal stenosis, lumbar region with neurogenic claudication: Secondary | ICD-10-CM | POA: Diagnosis not present

## 2021-12-17 ENCOUNTER — Encounter: Payer: Self-pay | Admitting: Cardiovascular Disease

## 2021-12-17 ENCOUNTER — Ambulatory Visit (INDEPENDENT_AMBULATORY_CARE_PROVIDER_SITE_OTHER): Payer: Medicare HMO | Admitting: Cardiovascular Disease

## 2021-12-17 VITALS — BP 102/60 | HR 76 | Ht 67.0 in | Wt 140.0 lb

## 2021-12-17 DIAGNOSIS — I739 Peripheral vascular disease, unspecified: Secondary | ICD-10-CM | POA: Diagnosis not present

## 2021-12-17 DIAGNOSIS — E7800 Pure hypercholesterolemia, unspecified: Secondary | ICD-10-CM | POA: Diagnosis not present

## 2021-12-17 DIAGNOSIS — I10 Essential (primary) hypertension: Secondary | ICD-10-CM

## 2021-12-17 DIAGNOSIS — I2510 Atherosclerotic heart disease of native coronary artery without angina pectoris: Secondary | ICD-10-CM | POA: Diagnosis not present

## 2021-12-17 DIAGNOSIS — G8114 Spastic hemiplegia affecting left nondominant side: Secondary | ICD-10-CM

## 2021-12-17 MED ORDER — NIFEDIPINE ER 60 MG PO TB24: 60.0000 mg | ORAL_TABLET | Freq: Every evening | ORAL | 3 refills | Status: DC

## 2021-12-17 NOTE — Patient Instructions (Signed)
Medication Instructions:  STOP the 30 mg Nifedipine. Still take the 60 mg tablet at night  *If you need a refill on your cardiac medications before your next appointment, please call your pharmacy*   Lab Work: None ordered If you have labs (blood work) drawn today and your tests are completely normal, you will receive your results only by: [place] (if you have MyChart) OR A paper copy in the mail If you have any lab test that is abnormal or we need to change your treatment, we will call you to review the results.   Testing/Procedures: None ordered   Follow-Up: At [facility], you and your health needs are our priority.  As part of our continuing mission to provide you with exceptional heart care, we have created designated Provider Care Teams.  These Care Teams include your primary Cardiologist (physician) and Advanced Practice Providers (APPs -  Physician Assistants and Nurse Practitioners) who all work together to provide you with the care you need, when you need it.  We recommend signing up for the patient portal called "MyChart".  Sign up information is provided on this After Visit Summary.  MyChart is used to connect with patients for Virtual Visits (Telemedicine).  Patients are able to view lab/test results, encounter notes, upcoming appointments, etc.  Non-urgent messages can be sent to your provider as well.   To learn more about what you can do with MyChart, go to [email or web address].    Your next appointment:   12 month(s)  The format for your next appointment:   In Person  Provider:   Sanda Klein, MD {

## 2021-12-17 NOTE — Progress Notes (Signed)
Cardiology office note   Evaluation Performed:  Follow-up visit  Date:  12/19/2021   ID:  Howard Jackson, DOB 1946-04-02, MRN [id number]  PCP:  Kristen Loader, FNP  Cardiologist:  Sanda Klein, MD  Electrophysiologist:  None   Chief Complaint: Fatigue, intermittent blood pressure problems  History of Present Illness:    Howard Jackson is a 76 y.o. male with CAD, PAD and hypertension related to an adrenal tumor.  He has a variety of complaints today, but none of these are cardiovascular.  He fell from his bed while trying to put on his pants.  He hurt his right shoulder and he has pain and numbness in the right side of his neck and his shoulder.  He denies shortness of breath at rest or with his limited ability to perform physical activity.  He does not have chest pain.  He has not had any head injury and denies problems with bleeding.  He is lost substantial weight and his BMI is now down to 122.  His blood pressure has since been lower, although it remains rather volatile.  He has not experienced dizziness or syncope.  He is confident his fall was due to loss of balance.  His most recent lipid profile is not available for review, but all the numbers were "great" according to him.  In the past he had more problems with palpitations, but these are currently not a big problem.  In the past, he wore a Zio arrhythmia monitor that showed normal sinus rhythm with occasional brief episodes of paroxysmal atrial tachycardia.  There was no atrial fibrillation and no significant ventricular arrhythmia.  The patient specifically denies any chest pain at rest exertion, dyspnea at rest or with exertion, orthopnea, paroxysmal nocturnal dyspnea, syncope, palpitations, focal neurological deficits, intermittent claudication, lower extremity edema, unexplained weight gain, cough, hemoptysis or wheezing.    He bears a diagnosis of pheochromocytoma initially identified in 1989. 2010 abdominal  MRI shows a 6 mm right adrenal mass consistent with pheochromocytoma.  I wonder if in fact he has an adrenal adenoma causing hyperaldosteronism.   He has a history of extensive PAD and CAD:   - subtotal right popliteal occlusion with weak monophasic PT and Per flow and occluded AT. On the left all three calf vessels are occluded, with collateral reconstitution of the PT and Per. Right ABI 0.73, left 0.77 (Doppler Sept 2014). There was no significant pelvic or femoral disease.  - He had moderate left renal artery stenosis (50-60% angio 2011, <60% Duplex 2014), but his most recent duplex ultrasound from 2015 did not show evidence of renal artery stenosis on either side..  - Carotid US 11/2013 at [facility] showed no significant lesions. Residual right spastic hemiparesis after a remote left hemispheric stroke. -  He has had 5 coronary angioplasty/stent  procedures to the RCA and LAD, most recently in April 2011 for LAD in-stent restenosis (when he received another drug-eluting stent)   In the past he had very prominent symptoms of orthostatic hypotension while taking clonidine. He was intolerant to treatment with PDE 5 inhibitors for erectile dysfunction due to nasal congestion and dyspepsia.    Past Medical History:  Diagnosis Date   Adrenal gland disorder ([place])    tumor present - no change-  no recent increase     Arthritis    Benign pheochromocytoma of right adrenal gland 06/23/2014   Never had histological diagnosis, but reportedly initially diagnosed in [place]  left foot   Complication of anesthesia    "died on the table during cervical fusion" '89 - believed to be d/t "too much medication" to treat HTN and was later dx with pheochromocytoma   Coronary artery disease    cleared for surg. by Dr. Gwenlyn Found - [facility]   H/O hiatal hernia    Hammer toe    Herniated lumbar intervertebral disc    History of anxiety    Hyperlipidemia    Hypertension    Neurogenic bladder    Neuromuscular  disorder ([place])    carpal tunnel problem, even after surgery release    Peripheral arterial disease ([place])    Primary aldosteronism ([place])    Stroke ([place])    in [place], L sided weakness, wears a brace on L leg & uses cane   Past Surgical History:  Procedure Laterality Date   ANTERIOR APPROACH HEMI HIP ARTHROPLASTY Left 04/15/2018   Procedure: ANTERIOR APPROACH HEMI HIP ARTHROPLASTY;  Surgeon: Rod Can, MD;  Location: [place];  Service: Orthopedics;  Laterality: Left;   CARPAL TUNNEL RELEASE     bilateral    CERVICAL FUSION     CORONARY ANGIOPLASTY WITH STENT PLACEMENT     pt reports having 5 stents in his heart-2011   HERNIA REPAIR     INGUINAL HERNIA REPAIR Left 11/18/2012   Procedure: HERNIA REPAIR INGUINAL ADULT;  Surgeon: Harl Bowie, MD;  Location: [facility];  Service: General;  Laterality: Left;   INSERTION OF MESH Left 11/18/2012   Procedure: INSERTION OF MESH;  Surgeon: Harl Bowie, MD;  Location: [place] [facility];  Service: General;  Laterality: Left;     Current Meds  Medication Sig   Baclofen 5 MG TABS Take 5 mg by mouth as needed.   clopidogrel (PLAVIX) 75 MG tablet TAKE 1 TABLET BY MOUTH EVERY DAY   Coenzyme Q10-Vitamin E (QUNOL ULTRA COQ10) 100-150 MG-UNIT CAPS Take by mouth.   Feverfew 380 MG CAPS Take 380 mg by mouth as needed.   finasteride (PROSCAR) 5 MG tablet Take 5 mg by mouth daily.   FLOVENT HFA 110 MCG/ACT inhaler Inhale 1 puff into the lungs 3 (three) times daily.    labetalol (NORMODYNE) 300 MG tablet TAKE 1 TABLET BY MOUTH 2 TIMES DAILY.   lactobacillus acidophilus (BACID) TABS tablet Take 2 tablets by mouth 2 (two) times daily.   rosuvastatin (CRESTOR) 20 MG tablet TOME UNA TABLETA TODOS LOS DIAS (Patient taking differently: Take 20 mg by mouth daily.)   senna-docusate (SENOKOT-S) 8.6-50 MG tablet Take 3 tablets by mouth every evening.   spironolactone (ALDACTONE) 100 MG tablet Take 1 tablet (100 mg total) by mouth daily.   tamsulosin (FLOMAX) 0.4 MG CAPS  capsule Take 1 capsule (0.4 mg total) by mouth daily.   tiZANidine (ZANAFLEX) 4 MG tablet TAKE 2 TABLETS BY MOUTH AT BEDTIME AS NEEDED   [DISCONTINUED] NIFEdipine (ADALAT CC) 30 MG 24 hr tablet Take 30 mg every morning     Allergies:   Elavil [amitriptyline hcl], Losartan potassium, and Sulfa antibiotics   Social History   Tobacco Use   Smoking status: Never   Smokeless tobacco: Never  Vaping Use   Vaping Use: Never used  Substance Use Topics   Alcohol use: Yes    Comment: social - one drink, on special occasion    Drug use: No     Family Hx: The patient's family history includes Alzheimer's disease in his father; CVA in his father and paternal grandmother;  Diabetes in his father; Hypertension in his brother, brother, mother, and sister.  ROS:   Please see the history of present illness.    All other systems reviewed and are negative.   Prior CV studies:   The following studies were reviewed today: Labs from outside source Carotid duplex ultrasound August 04, 2018  Labs/Other Tests and Data Reviewed:    EKG:  An ECG dated 05/15/2018 was personally reviewed today and demonstrated:  Normal sinus rhythm, left axis deviation not quite meeting criteria for left anterior fascicular block  Recent Labs: 10/09/2021: ALT 34; BUN 30; Creatinine, Ser 1.09; Hemoglobin 15.8; Platelets 196; Potassium 4.3; Sodium 131   Recent Lipid Panel Lab Results  Component Value Date/Time   CHOL 156 02/03/2020 02:50 PM   TRIG 52 02/03/2020 02:50 PM   HDL 49 02/03/2020 02:50 PM   CHOLHDL 3.2 02/03/2020 02:50 PM   CHOLHDL 3.1 05/22/2016 02:35 PM   LDLCALC 96 02/03/2020 02:50 PM    Wt Readings from Last 3 Encounters:  12/17/21 140 lb (63.5 kg)  10/09/21 154 lb 1.6 oz (69.9 kg)  01/11/21 154 lb 1.6 oz (69.9 kg)     Objective:    Vital Signs:  BP 102/60 (BP Location: Left Arm, Patient Position: Sitting, Cuff Size: Normal)   Pulse 76   Ht '5\' 7"'$  (1.702 m)   Wt 140 lb (63.5 kg) Comment:  Patient stated weight  SpO2 97%   BMI 21.93 kg/m      General: Alert, oriented x3, no distress, lean Head: no evidence of trauma, PERRL, EOMI, no exophtalmos or lid lag, no myxedema, no xanthelasma; normal ears, nose and oropharynx Neck: normal jugular venous pulsations and no hepatojugular reflux; brisk carotid pulses without delay and no carotid bruits Chest: clear to auscultation, no signs of consolidation by percussion or palpation, normal fremitus, symmetrical and full respiratory excursions Cardiovascular: normal position and quality of the apical impulse, regular rhythm, normal first and second heart sounds, no murmurs, rubs or gallops Abdomen: no tenderness or distention, no masses by palpation, no abnormal pulsatility or arterial bruits, normal bowel sounds, no hepatosplenomegaly Extremities: no clubbing, cyanosis or edema; 2+ radial, ulnar and brachial pulses bilaterally; 2+ right femoral, posterior tibial and dorsalis pedis pulses; 2+ left femoral, posterior tibial and dorsalis pedis pulses; no subclavian or femoral bruits Neurological: Spastic paresis of the left upper extremity Psych: Normal mood and affect   ASSESSMENT & PLAN:    CAD: Sedentary lifestyle.  At least for his level of activity he does not have angina pectoris.  On chronic treatment with clopidogrel due to history of stroke. PAD: No intermittent claudication (again with limited activity level). HTN: Blood pressure controlled now seems to be excessive.  Asked him to stop his morning dose of nifedipine.  Continue labetalol 300 mg twice daily, nifedipine 60 mg every evening and spironolactone 100 mg daily.  Based on past history and response to medication he probably has primary hyperaldosteronism rather than pheochromocytoma.  Spironolactone should remain part of his medical hypertensive regimen.  Avoid unopposed beta blockers (he is on labetalol). HLP: Labs followed by PCP.  Target LDL less than 70. L hemiparesis:  Sequela of old ischemic strokes, on lifelong clopidogrel.  Carotid duplex ultrasound performed January 2020 showed no evidence of significant obstruction in either carotid artery.  No plan to perform routine carotid ultrasound testing, unless new symptoms develop.  Patient Instructions  Medication Instructions:  STOP the 30 mg Nifedipine. Still take the 60 mg tablet at night  *  If you need a refill on your cardiac medications before your next appointment, please call your pharmacy*   Lab Work: None ordered If you have labs (blood work) drawn today and your tests are completely normal, you will receive your results only by: [place] (if you have MyChart) OR A paper copy in the mail If you have any lab test that is abnormal or we need to change your treatment, we will call you to review the results.   Testing/Procedures: None ordered   Follow-Up: At [facility], you and your health needs are our priority.  As part of our continuing mission to provide you with exceptional heart care, we have created designated Provider Care Teams.  These Care Teams include your primary Cardiologist (physician) and Advanced Practice Providers (APPs -  Physician Assistants and Nurse Practitioners) who all work together to provide you with the care you need, when you need it.  We recommend signing up for the patient portal called "MyChart".  Sign up information is provided on this After Visit Summary.  MyChart is used to connect with patients for Virtual Visits (Telemedicine).  Patients are able to view lab/test results, encounter notes, upcoming appointments, etc.  Non-urgent messages can be sent to your provider as well.   To learn more about what you can do with MyChart, go to [email or web address].    Your next appointment:   12 month(s)  The format for your next appointment:   In Person  Provider:   Sanda Klein, MD {      Signed, Sanda Klein, MD  12/19/2021 8:02 AM    [place]

## 2022-01-03 DIAGNOSIS — M25551 Pain in right hip: Secondary | ICD-10-CM | POA: Diagnosis not present

## 2022-01-10 DIAGNOSIS — M5451 Vertebrogenic low back pain: Secondary | ICD-10-CM | POA: Diagnosis not present

## 2022-01-10 DIAGNOSIS — M542 Cervicalgia: Secondary | ICD-10-CM | POA: Diagnosis not present

## 2022-01-20 DIAGNOSIS — S72032D Displaced midcervical fracture of left femur, subsequent encounter for closed fracture with routine healing: Secondary | ICD-10-CM | POA: Diagnosis not present

## 2022-01-28 DIAGNOSIS — L089 Local infection of the skin and subcutaneous tissue, unspecified: Secondary | ICD-10-CM | POA: Diagnosis not present

## 2022-01-28 DIAGNOSIS — M47816 Spondylosis without myelopathy or radiculopathy, lumbar region: Secondary | ICD-10-CM | POA: Diagnosis not present

## 2022-02-05 DIAGNOSIS — M25521 Pain in right elbow: Secondary | ICD-10-CM | POA: Diagnosis not present

## 2022-02-05 DIAGNOSIS — G5601 Carpal tunnel syndrome, right upper limb: Secondary | ICD-10-CM | POA: Diagnosis not present

## 2022-02-11 DIAGNOSIS — M25521 Pain in right elbow: Secondary | ICD-10-CM | POA: Diagnosis not present

## 2022-02-14 DIAGNOSIS — Z8673 Personal history of transient ischemic attack (TIA), and cerebral infarction without residual deficits: Secondary | ICD-10-CM | POA: Diagnosis not present

## 2022-02-14 DIAGNOSIS — G8194 Hemiplegia, unspecified affecting left nondominant side: Secondary | ICD-10-CM | POA: Diagnosis not present

## 2022-02-14 DIAGNOSIS — M62838 Other muscle spasm: Secondary | ICD-10-CM | POA: Diagnosis not present

## 2022-02-20 DIAGNOSIS — G5601 Carpal tunnel syndrome, right upper limb: Secondary | ICD-10-CM | POA: Diagnosis not present

## 2022-02-20 DIAGNOSIS — G5621 Lesion of ulnar nerve, right upper limb: Secondary | ICD-10-CM | POA: Diagnosis not present

## 2022-02-26 DIAGNOSIS — G5601 Carpal tunnel syndrome, right upper limb: Secondary | ICD-10-CM | POA: Diagnosis not present

## 2022-02-26 DIAGNOSIS — M25521 Pain in right elbow: Secondary | ICD-10-CM | POA: Diagnosis not present

## 2022-03-05 DIAGNOSIS — E785 Hyperlipidemia, unspecified: Secondary | ICD-10-CM | POA: Diagnosis not present

## 2022-03-05 DIAGNOSIS — N401 Enlarged prostate with lower urinary tract symptoms: Secondary | ICD-10-CM | POA: Diagnosis not present

## 2022-03-05 DIAGNOSIS — G8929 Other chronic pain: Secondary | ICD-10-CM | POA: Diagnosis not present

## 2022-03-05 DIAGNOSIS — I10 Essential (primary) hypertension: Secondary | ICD-10-CM | POA: Diagnosis not present

## 2022-03-05 DIAGNOSIS — K219 Gastro-esophageal reflux disease without esophagitis: Secondary | ICD-10-CM | POA: Diagnosis not present

## 2022-03-05 DIAGNOSIS — I2510 Atherosclerotic heart disease of native coronary artery without angina pectoris: Secondary | ICD-10-CM | POA: Diagnosis not present

## 2022-03-06 ENCOUNTER — Emergency Department (HOSPITAL_COMMUNITY)
Admission: EM | Admit: 2022-03-06 | Discharge: 2022-03-06 | Disposition: A | Discharge status: Home / Self Care | Payer: Medicare HMO | Attending: Emergency Medicine | Admitting: Emergency Medicine

## 2022-03-06 ENCOUNTER — Encounter (HOSPITAL_COMMUNITY): Payer: Self-pay | Admitting: Emergency Medicine

## 2022-03-06 ENCOUNTER — Other Ambulatory Visit: Payer: Self-pay

## 2022-03-06 DIAGNOSIS — H0289 Other specified disorders of eyelid: Secondary | ICD-10-CM | POA: Insufficient documentation

## 2022-03-06 DIAGNOSIS — R58 Hemorrhage, not elsewhere classified: Secondary | ICD-10-CM | POA: Diagnosis not present

## 2022-03-06 DIAGNOSIS — R609 Edema, unspecified: Secondary | ICD-10-CM | POA: Diagnosis not present

## 2022-03-06 MED ORDER — SILVER NITRATE-POT NITRATE 75-25 % EX MISC
1.0000 | Freq: Once | CUTANEOUS | Status: AC
  Administered 2022-03-06: 1 via TOPICAL
  Filled 2022-03-06: qty 10

## 2022-03-06 NOTE — ED Triage Notes (Signed)
BIBA Per EMS: Pt coming from home w/ bleeding from left eye when he woke up this morning. Denies pain, denies blurry vision.  134/88 74HR  16 RR 98% RA 160 CBG  Non ambulatory due to hx stroke

## 2022-03-06 NOTE — ED Provider Notes (Signed)
[place] DEPT Provider Note   CSN: [id number] Arrival date & time: 03/06/22  1042     History  Chief Complaint  Patient presents with   Eye bleeding     Howard Jackson is a 76 y.o. male.  HPI Patient states he woke up with bleeding around his left eye.  No lightheadedness or dizziness.  No vision changes.  No pain.  States he has had a little polyp on his left upper eyelid for a long time.  He is not on blood thinners.    Home Medications Prior to Admission medications   Medication Sig Start Date End Date Taking? Authorizing Provider  aspirin 325 MG tablet Take 650 mg by mouth once. Patient not taking: Reported on 12/17/2021    [provider]  Baclofen 5 MG TABS Take 5 mg by mouth as needed.    [provider]  Cholecalciferol (VITAMIN D3) 125 MCG (5000 UT) CAPS Take 5,000 Units by mouth as needed. Patient not taking: Reported on 12/17/2021    [provider]  CITRUS BERGAMOT PO Take by mouth. Patient not taking: Reported on 12/17/2021    [provider]  clopidogrel (PLAVIX) 75 MG tablet TAKE 1 TABLET BY MOUTH EVERY DAY 10/28/21   Croitoru, Mihai, MD  Coenzyme Q10-Vitamin E (QUNOL ULTRA COQ10) 100-150 MG-UNIT CAPS Take by mouth.    [provider]  cyclobenzaprine (FLEXERIL) 5 MG tablet Take 5 mg by mouth 3 (three) times daily as needed. Patient not taking: Reported on 12/17/2021 11/09/21   [provider]  famotidine (PEPCID) 10 MG tablet Take 10 mg by mouth 2 (two) times daily as needed. Patient not taking: Reported on 12/17/2021    [provider]  Feverfew 380 MG CAPS Take 380 mg by mouth as needed.    [provider]  finasteride (PROSCAR) 5 MG tablet Take 5 mg by mouth daily. 01/19/18   [provider]  FLOVENT HFA 110 MCG/ACT inhaler Inhale 1 puff into the lungs 3 (three) times daily.  04/13/14   [provider]  labetalol (NORMODYNE) 300 MG tablet TAKE 1  TABLET BY MOUTH 2 TIMES DAILY. 05/08/21   Croitoru, Mihai, MD  lactobacillus acidophilus (BACID) TABS tablet Take 2 tablets by mouth 2 (two) times daily. 04/20/18   Alma Friendly, MD  mupirocin ointment (BACTROBAN) 2 % Apply 1 application topically as needed. Patient not taking: Reported on 12/17/2021 05/26/19   [provider]  NIFEdipine (ADALAT CC) 60 MG 24 hr tablet Take 1 tablet (60 mg total) by mouth every evening. 12/17/21   Croitoru, Mihai, MD  pantoprazole (PROTONIX) 40 MG tablet 40 mg as needed (heartburn). Patient not taking: Reported on 12/17/2021 11/30/19   [provider]  rosuvastatin (CRESTOR) 20 MG tablet [place] LOS DIAS Patient taking differently: Take 20 mg by mouth daily. 01/28/21   Croitoru, Mihai, MD  senna-docusate (SENOKOT-S) 8.6-50 MG tablet Take 3 tablets by mouth every evening. 04/20/18   Alma Friendly, MD  spironolactone (ALDACTONE) 100 MG tablet Take 1 tablet (100 mg total) by mouth daily. 04/21/18   Alma Friendly, MD  tamsulosin (FLOMAX) 0.4 MG CAPS capsule Take 1 capsule (0.4 mg total) by mouth daily. 01/09/17   Dorena Dew, FNP  tiZANidine (ZANAFLEX) 4 MG tablet TAKE 2 TABLETS BY MOUTH AT BEDTIME AS NEEDED 03/16/19   [provider]      Allergies    Elavil [amitriptyline hcl], Losartan potassium,  and Sulfa antibiotics    Review of Systems   Review of Systems  Physical Exam Updated Vital Signs BP 112/66 (BP Location: Right Arm)   Pulse 70   Temp 98.4 F (36.9 C) (Oral)   Resp 16   SpO2 99%  Physical Exam Vitals reviewed.  Eyes:     Comments: There is a small polyp inferior to the lashes on the mid left upper eyelid.  There is bleeding laterally to this.  Eye movements intact.  Globe appears normal.  Skin:    Capillary Refill: Capillary refill takes less than 2 seconds.  Neurological:     Mental Status: He is alert.     ED Results / Procedures / Treatments   Labs (all labs ordered are  listed, but only abnormal results are displayed) Labs Reviewed - No data to display  EKG None  Radiology No results found.  Procedures Procedures    Medications Ordered in ED Medications  silver nitrate applicators applicator 1 Stick (1 Stick Topical Given 03/06/22 1123)    ED Course/ Medical Decision Making/ A&P                           Medical Decision Making Risk Prescription drug management.   Patient with bleeding near polyp on left eyelid.  Still has mild oozing.  Silver nitrate stick used for cautery.  Improved bleeding.  Will discharge home with ophthalmology follow-up as needed.  States he has not seen anyone for these polyps in the past.        Final Clinical Impression(s) / ED Diagnoses Final diagnoses:  Eyelid bleeding    Rx / DC Orders ED Discharge Orders     None         Davonna Belling, MD 03/06/22 1217

## 2022-03-06 NOTE — Discharge Instructions (Signed)
Follow-up with ophthalmology as needed.  He can hold pressure on the wound if it bleeds again.

## 2022-03-31 ENCOUNTER — Ambulatory Visit: Payer: Medicare HMO | Admitting: Dermatology

## 2022-04-01 DIAGNOSIS — M21612 Bunion of left foot: Secondary | ICD-10-CM | POA: Diagnosis not present

## 2022-04-01 DIAGNOSIS — M2041 Other hammer toe(s) (acquired), right foot: Secondary | ICD-10-CM | POA: Diagnosis not present

## 2022-04-01 DIAGNOSIS — G8194 Hemiplegia, unspecified affecting left nondominant side: Secondary | ICD-10-CM | POA: Diagnosis not present

## 2022-04-01 DIAGNOSIS — M2042 Other hammer toe(s) (acquired), left foot: Secondary | ICD-10-CM | POA: Diagnosis not present

## 2022-04-01 DIAGNOSIS — M79672 Pain in left foot: Secondary | ICD-10-CM | POA: Diagnosis not present

## 2022-04-01 DIAGNOSIS — Z8673 Personal history of transient ischemic attack (TIA), and cerebral infarction without residual deficits: Secondary | ICD-10-CM | POA: Diagnosis not present

## 2022-04-01 DIAGNOSIS — M62838 Other muscle spasm: Secondary | ICD-10-CM | POA: Diagnosis not present

## 2022-04-16 DIAGNOSIS — Z6822 Body mass index (BMI) 22.0-22.9, adult: Secondary | ICD-10-CM | POA: Diagnosis not present

## 2022-04-16 DIAGNOSIS — Z0000 Encounter for general adult medical examination without abnormal findings: Secondary | ICD-10-CM | POA: Diagnosis not present

## 2022-04-16 DIAGNOSIS — Z1389 Encounter for screening for other disorder: Secondary | ICD-10-CM | POA: Diagnosis not present

## 2022-04-22 DIAGNOSIS — H0102B Squamous blepharitis left eye, upper and lower eyelids: Secondary | ICD-10-CM | POA: Diagnosis not present

## 2022-04-25 DIAGNOSIS — Z125 Encounter for screening for malignant neoplasm of prostate: Secondary | ICD-10-CM | POA: Diagnosis not present

## 2022-04-25 DIAGNOSIS — M5030 Other cervical disc degeneration, unspecified cervical region: Secondary | ICD-10-CM | POA: Diagnosis not present

## 2022-04-25 DIAGNOSIS — Z981 Arthrodesis status: Secondary | ICD-10-CM | POA: Diagnosis not present

## 2022-04-25 DIAGNOSIS — M542 Cervicalgia: Secondary | ICD-10-CM | POA: Diagnosis not present

## 2022-04-25 DIAGNOSIS — G8194 Hemiplegia, unspecified affecting left nondominant side: Secondary | ICD-10-CM | POA: Diagnosis not present

## 2022-04-25 DIAGNOSIS — N1831 Chronic kidney disease, stage 3a: Secondary | ICD-10-CM | POA: Diagnosis not present

## 2022-04-25 DIAGNOSIS — I10 Essential (primary) hypertension: Secondary | ICD-10-CM | POA: Diagnosis not present

## 2022-04-29 ENCOUNTER — Other Ambulatory Visit: Payer: Self-pay

## 2022-04-29 ENCOUNTER — Other Ambulatory Visit: Payer: Self-pay | Admitting: Family Medicine

## 2022-04-29 DIAGNOSIS — M542 Cervicalgia: Secondary | ICD-10-CM

## 2022-04-29 DIAGNOSIS — Z981 Arthrodesis status: Secondary | ICD-10-CM

## 2022-04-29 DIAGNOSIS — M5030 Other cervical disc degeneration, unspecified cervical region: Secondary | ICD-10-CM

## 2022-04-29 MED ORDER — SPIRONOLACTONE 100 MG PO TABS: 100.0000 mg | ORAL_TABLET | Freq: Every day | ORAL | Status: DC

## 2022-04-29 MED ORDER — NIFEDIPINE ER 60 MG PO TB24: 60.0000 mg | ORAL_TABLET | Freq: Every evening | ORAL | 3 refills | Status: AC

## 2022-05-02 ENCOUNTER — Ambulatory Visit (INDEPENDENT_AMBULATORY_CARE_PROVIDER_SITE_OTHER): Payer: Medicare HMO | Admitting: Podiatry

## 2022-05-02 ENCOUNTER — Ambulatory Visit (INDEPENDENT_AMBULATORY_CARE_PROVIDER_SITE_OTHER): Payer: Medicare HMO

## 2022-05-02 DIAGNOSIS — M2042 Other hammer toe(s) (acquired), left foot: Secondary | ICD-10-CM

## 2022-05-05 NOTE — Progress Notes (Signed)
Chief Complaint  Patient presents with   Foot Pain    Patient is here for left foot pain and hammertoe.patient states that he has had pain for 4 months with discoloration in the left foot.    Subjective: 76 y.o. male presenting today as a new patient minimally ambulatory in a wheelchair presenting for evaluation and treatment of an asymptomatic hammertoe to the left foot.  Patient has history of stroke and ever since the stroke he developed increased bunion deformity specifically to the left foot.  Presenting for further treatment and evaluation  Past Medical History:  Diagnosis Date   Adrenal gland disorder ([place])    tumor present - no change-  no recent increase     Arthritis    Benign pheochromocytoma of right adrenal gland 06/23/2014   Never had histological diagnosis, but reportedly initially diagnosed in [place]    left foot   Complication of anesthesia    "died on the table during cervical fusion" '89 - believed to be d/t "too much medication" to treat HTN and was later dx with pheochromocytoma   Coronary artery disease    cleared for surg. by Dr. Gwenlyn Found - [facility]   H/O hiatal hernia    Hammer toe    Herniated lumbar intervertebral disc    History of anxiety    Hyperlipidemia    Hypertension    Neurogenic bladder    Neuromuscular disorder ([place])    carpal tunnel problem, even after surgery release    Peripheral arterial disease ([place])    Primary aldosteronism ([place])    Stroke ([place])    in [place], L sided weakness, wears a brace on L leg & uses cane   Allergies  Allergen Reactions   Elavil [Amitriptyline Hcl] Hypertension   Losartan Potassium     Other reaction(s): hypotension   Sulfa Antibiotics Other (See Comments)    Constipation  Other reaction(s): Other (See Comments) Constipation       Objective: Physical Exam General: The patient is alert and oriented x3 in no acute distress.  Dermatology: A symptomatic callus noted overlying the PIPJ of the second  digit likely secondary to close toed shoe gear that rubs  Vascular: Palpable pedal pulses bilaterally. No edema or erythema noted. Capillary refill within normal limits.  Neurological: Epicritic and protective threshold grossly intact bilaterally.   Musculoskeletal Exam: Clinical evidence of bunion deformity noted to the respective foot. There is moderate pain on palpation range of motion of the first MPJ. Lateral deviation of the hallux noted consistent with hallux abductovalgus. Hammertoe contracture also noted on clinical exam to digits #2 of the left foot. Symptomatic pain on palpation and range of motion also noted to the metatarsal phalangeal joints of the respective hammertoe digits.    Radiographic Exam: Increased intermetatarsal angle greater than 15 with a hallux abductus angle greater than 30 noted on AP view. Moderate degenerative changes noted within the first MPJ. Contracture deformity also noted to the interphalangeal joints and MPJs of the digits of the respective hammertoes.  Overlap deformity noted of the second toe  Assessment: 1. HAV w/ bunion deformity left 2. Hammertoe deformity left second   Plan of Care:  1. Patient was evaluated. X-Rays reviewed. 2.  For now pursue conservative treatment.  Recommend shoes and sneakers that do not irritate the toe and allow plenty of room in the toebox area especially to the second digit 3.  Recommend good foot hygiene and daily care 4.  Return  to clinic as needed    Edrick Kins, DPM [facility]  Dr. Edrick Kins, [address]                                        [place], [place] [address]                Office [phone]  Fax [phone]

## 2022-05-20 DIAGNOSIS — H52223 Regular astigmatism, bilateral: Secondary | ICD-10-CM | POA: Diagnosis not present

## 2022-05-20 DIAGNOSIS — H2513 Age-related nuclear cataract, bilateral: Secondary | ICD-10-CM | POA: Diagnosis not present

## 2022-05-20 DIAGNOSIS — H40023 Open angle with borderline findings, high risk, bilateral: Secondary | ICD-10-CM | POA: Diagnosis not present

## 2022-05-20 DIAGNOSIS — H35372 Puckering of macula, left eye: Secondary | ICD-10-CM | POA: Diagnosis not present

## 2022-05-20 DIAGNOSIS — H0289 Other specified disorders of eyelid: Secondary | ICD-10-CM | POA: Diagnosis not present

## 2022-05-20 DIAGNOSIS — H1045 Other chronic allergic conjunctivitis: Secondary | ICD-10-CM | POA: Diagnosis not present

## 2022-05-20 DIAGNOSIS — H5202 Hypermetropia, left eye: Secondary | ICD-10-CM | POA: Diagnosis not present

## 2022-05-20 DIAGNOSIS — H04123 Dry eye syndrome of bilateral lacrimal glands: Secondary | ICD-10-CM | POA: Diagnosis not present

## 2022-05-20 DIAGNOSIS — H0102B Squamous blepharitis left eye, upper and lower eyelids: Secondary | ICD-10-CM | POA: Diagnosis not present

## 2022-05-23 ENCOUNTER — Other Ambulatory Visit: Payer: Self-pay

## 2022-05-23 DIAGNOSIS — I10 Essential (primary) hypertension: Secondary | ICD-10-CM

## 2022-05-26 ENCOUNTER — Telehealth: Payer: Self-pay | Admitting: *Deleted

## 2022-05-26 NOTE — Telephone Encounter (Signed)
   Telephone encounter was:  Successful.  05/26/2022 Name: Howard Jackson MRN: [id number] DOB: [birth year]  Howard Jackson is a 77 y.o. year old male who is a primary care patient of Kristen Loader, FNP . The community resource team was consulted for assistance with Transportation Needs  and [place] guide performed the following interventions: Patient provided with information about care guide support team and interviewed to confirm resource needs Follow up call placed to community resources to determine status of patients referral. Provided information on getting a lift will send information Independent living to perhaps help with some of his independence needs   Follow Up Plan:  No further follow up planned at this time. The patient has been provided with needed resources. [place] [phone] [address] , [place] [address] Email : [email or web address]

## 2022-05-27 DIAGNOSIS — M47812 Spondylosis without myelopathy or radiculopathy, cervical region: Secondary | ICD-10-CM | POA: Diagnosis not present

## 2022-05-27 DIAGNOSIS — M40202 Unspecified kyphosis, cervical region: Secondary | ICD-10-CM | POA: Diagnosis not present

## 2022-05-27 DIAGNOSIS — M4802 Spinal stenosis, cervical region: Secondary | ICD-10-CM | POA: Diagnosis not present

## 2022-05-27 DIAGNOSIS — Z981 Arthrodesis status: Secondary | ICD-10-CM | POA: Diagnosis not present

## 2022-05-27 DIAGNOSIS — M19012 Primary osteoarthritis, left shoulder: Secondary | ICD-10-CM | POA: Diagnosis not present

## 2022-06-03 DIAGNOSIS — H409 Unspecified glaucoma: Secondary | ICD-10-CM | POA: Diagnosis not present

## 2022-06-03 DIAGNOSIS — E041 Nontoxic single thyroid nodule: Secondary | ICD-10-CM | POA: Diagnosis not present

## 2022-06-03 DIAGNOSIS — H04129 Dry eye syndrome of unspecified lacrimal gland: Secondary | ICD-10-CM | POA: Diagnosis not present

## 2022-06-03 DIAGNOSIS — I714 Abdominal aortic aneurysm, without rupture, unspecified: Secondary | ICD-10-CM | POA: Diagnosis not present

## 2022-06-03 DIAGNOSIS — G8194 Hemiplegia, unspecified affecting left nondominant side: Secondary | ICD-10-CM | POA: Diagnosis not present

## 2022-06-03 DIAGNOSIS — K649 Unspecified hemorrhoids: Secondary | ICD-10-CM | POA: Diagnosis not present

## 2022-06-03 DIAGNOSIS — M47812 Spondylosis without myelopathy or radiculopathy, cervical region: Secondary | ICD-10-CM | POA: Diagnosis not present

## 2022-06-03 DIAGNOSIS — I69354 Hemiplegia and hemiparesis following cerebral infarction affecting left non-dominant side: Secondary | ICD-10-CM | POA: Diagnosis not present

## 2022-06-03 DIAGNOSIS — M5030 Other cervical disc degeneration, unspecified cervical region: Secondary | ICD-10-CM | POA: Diagnosis not present

## 2022-06-09 DIAGNOSIS — F524 Premature ejaculation: Secondary | ICD-10-CM | POA: Diagnosis not present

## 2022-06-09 DIAGNOSIS — N138 Other obstructive and reflux uropathy: Secondary | ICD-10-CM | POA: Diagnosis not present

## 2022-06-09 DIAGNOSIS — R3911 Hesitancy of micturition: Secondary | ICD-10-CM | POA: Diagnosis not present

## 2022-06-09 DIAGNOSIS — N401 Enlarged prostate with lower urinary tract symptoms: Secondary | ICD-10-CM | POA: Diagnosis not present

## 2022-06-09 DIAGNOSIS — N3941 Urge incontinence: Secondary | ICD-10-CM | POA: Diagnosis not present

## 2022-06-09 DIAGNOSIS — R350 Frequency of micturition: Secondary | ICD-10-CM | POA: Diagnosis not present

## 2022-06-09 DIAGNOSIS — Z87440 Personal history of urinary (tract) infections: Secondary | ICD-10-CM | POA: Diagnosis not present

## 2022-06-09 DIAGNOSIS — R3912 Poor urinary stream: Secondary | ICD-10-CM | POA: Diagnosis not present

## 2022-06-09 DIAGNOSIS — D3501 Benign neoplasm of right adrenal gland: Secondary | ICD-10-CM | POA: Diagnosis not present

## 2022-06-09 DIAGNOSIS — N529 Male erectile dysfunction, unspecified: Secondary | ICD-10-CM | POA: Diagnosis not present

## 2022-06-09 DIAGNOSIS — Z8042 Family history of malignant neoplasm of prostate: Secondary | ICD-10-CM | POA: Diagnosis not present

## 2022-06-09 DIAGNOSIS — R3914 Feeling of incomplete bladder emptying: Secondary | ICD-10-CM | POA: Diagnosis not present

## 2022-06-09 DIAGNOSIS — N528 Other male erectile dysfunction: Secondary | ICD-10-CM | POA: Diagnosis not present

## 2022-06-12 DIAGNOSIS — E785 Hyperlipidemia, unspecified: Secondary | ICD-10-CM | POA: Diagnosis not present

## 2022-06-12 DIAGNOSIS — J4520 Mild intermittent asthma, uncomplicated: Secondary | ICD-10-CM | POA: Diagnosis not present

## 2022-06-12 DIAGNOSIS — I10 Essential (primary) hypertension: Secondary | ICD-10-CM | POA: Diagnosis not present

## 2022-06-12 DIAGNOSIS — K219 Gastro-esophageal reflux disease without esophagitis: Secondary | ICD-10-CM | POA: Diagnosis not present

## 2022-07-11 DIAGNOSIS — I10 Essential (primary) hypertension: Secondary | ICD-10-CM | POA: Diagnosis not present

## 2022-07-11 DIAGNOSIS — I69354 Hemiplegia and hemiparesis following cerebral infarction affecting left non-dominant side: Secondary | ICD-10-CM | POA: Diagnosis not present

## 2022-07-11 DIAGNOSIS — M25551 Pain in right hip: Secondary | ICD-10-CM | POA: Diagnosis not present

## 2022-07-11 DIAGNOSIS — G8194 Hemiplegia, unspecified affecting left nondominant side: Secondary | ICD-10-CM | POA: Diagnosis not present

## 2022-07-11 DIAGNOSIS — Z6821 Body mass index (BMI) 21.0-21.9, adult: Secondary | ICD-10-CM | POA: Diagnosis not present

## 2022-08-13 DIAGNOSIS — Z9181 History of falling: Secondary | ICD-10-CM | POA: Diagnosis not present

## 2022-08-13 DIAGNOSIS — L989 Disorder of the skin and subcutaneous tissue, unspecified: Secondary | ICD-10-CM | POA: Diagnosis not present

## 2022-08-13 DIAGNOSIS — L89152 Pressure ulcer of sacral region, stage 2: Secondary | ICD-10-CM | POA: Diagnosis not present

## 2022-08-16 DIAGNOSIS — G8929 Other chronic pain: Secondary | ICD-10-CM | POA: Diagnosis not present

## 2022-08-16 DIAGNOSIS — I69354 Hemiplegia and hemiparesis following cerebral infarction affecting left non-dominant side: Secondary | ICD-10-CM | POA: Diagnosis not present

## 2022-08-16 DIAGNOSIS — I129 Hypertensive chronic kidney disease with stage 1 through stage 4 chronic kidney disease, or unspecified chronic kidney disease: Secondary | ICD-10-CM | POA: Diagnosis not present

## 2022-08-16 DIAGNOSIS — I2510 Atherosclerotic heart disease of native coronary artery without angina pectoris: Secondary | ICD-10-CM | POA: Diagnosis not present

## 2022-08-16 DIAGNOSIS — I739 Peripheral vascular disease, unspecified: Secondary | ICD-10-CM | POA: Diagnosis not present

## 2022-08-16 DIAGNOSIS — J4520 Mild intermittent asthma, uncomplicated: Secondary | ICD-10-CM | POA: Diagnosis not present

## 2022-08-16 DIAGNOSIS — I714 Abdominal aortic aneurysm, without rupture, unspecified: Secondary | ICD-10-CM | POA: Diagnosis not present

## 2022-08-16 DIAGNOSIS — L89152 Pressure ulcer of sacral region, stage 2: Secondary | ICD-10-CM | POA: Diagnosis not present

## 2022-08-16 DIAGNOSIS — N1831 Chronic kidney disease, stage 3a: Secondary | ICD-10-CM | POA: Diagnosis not present

## 2022-08-17 DIAGNOSIS — I69354 Hemiplegia and hemiparesis following cerebral infarction affecting left non-dominant side: Secondary | ICD-10-CM | POA: Diagnosis not present

## 2022-08-17 DIAGNOSIS — J4520 Mild intermittent asthma, uncomplicated: Secondary | ICD-10-CM | POA: Diagnosis not present

## 2022-08-17 DIAGNOSIS — G8929 Other chronic pain: Secondary | ICD-10-CM | POA: Diagnosis not present

## 2022-08-17 DIAGNOSIS — I739 Peripheral vascular disease, unspecified: Secondary | ICD-10-CM | POA: Diagnosis not present

## 2022-08-17 DIAGNOSIS — N1831 Chronic kidney disease, stage 3a: Secondary | ICD-10-CM | POA: Diagnosis not present

## 2022-08-17 DIAGNOSIS — I2510 Atherosclerotic heart disease of native coronary artery without angina pectoris: Secondary | ICD-10-CM | POA: Diagnosis not present

## 2022-08-17 DIAGNOSIS — I714 Abdominal aortic aneurysm, without rupture, unspecified: Secondary | ICD-10-CM | POA: Diagnosis not present

## 2022-08-17 DIAGNOSIS — I129 Hypertensive chronic kidney disease with stage 1 through stage 4 chronic kidney disease, or unspecified chronic kidney disease: Secondary | ICD-10-CM | POA: Diagnosis not present

## 2022-08-17 DIAGNOSIS — L89152 Pressure ulcer of sacral region, stage 2: Secondary | ICD-10-CM | POA: Diagnosis not present

## 2022-08-20 DIAGNOSIS — L89152 Pressure ulcer of sacral region, stage 2: Secondary | ICD-10-CM | POA: Diagnosis not present

## 2022-08-20 DIAGNOSIS — I129 Hypertensive chronic kidney disease with stage 1 through stage 4 chronic kidney disease, or unspecified chronic kidney disease: Secondary | ICD-10-CM | POA: Diagnosis not present

## 2022-08-20 DIAGNOSIS — J4520 Mild intermittent asthma, uncomplicated: Secondary | ICD-10-CM | POA: Diagnosis not present

## 2022-08-20 DIAGNOSIS — I714 Abdominal aortic aneurysm, without rupture, unspecified: Secondary | ICD-10-CM | POA: Diagnosis not present

## 2022-08-20 DIAGNOSIS — N1831 Chronic kidney disease, stage 3a: Secondary | ICD-10-CM | POA: Diagnosis not present

## 2022-08-20 DIAGNOSIS — I739 Peripheral vascular disease, unspecified: Secondary | ICD-10-CM | POA: Diagnosis not present

## 2022-08-20 DIAGNOSIS — I2510 Atherosclerotic heart disease of native coronary artery without angina pectoris: Secondary | ICD-10-CM | POA: Diagnosis not present

## 2022-08-20 DIAGNOSIS — G8929 Other chronic pain: Secondary | ICD-10-CM | POA: Diagnosis not present

## 2022-08-20 DIAGNOSIS — I69354 Hemiplegia and hemiparesis following cerebral infarction affecting left non-dominant side: Secondary | ICD-10-CM | POA: Diagnosis not present

## 2022-08-22 DIAGNOSIS — E785 Hyperlipidemia, unspecified: Secondary | ICD-10-CM | POA: Diagnosis not present

## 2022-08-22 DIAGNOSIS — I10 Essential (primary) hypertension: Secondary | ICD-10-CM | POA: Diagnosis not present

## 2022-08-22 DIAGNOSIS — J4520 Mild intermittent asthma, uncomplicated: Secondary | ICD-10-CM | POA: Diagnosis not present

## 2022-08-22 DIAGNOSIS — K219 Gastro-esophageal reflux disease without esophagitis: Secondary | ICD-10-CM | POA: Diagnosis not present

## 2022-08-22 DIAGNOSIS — G8929 Other chronic pain: Secondary | ICD-10-CM | POA: Diagnosis not present

## 2022-08-22 DIAGNOSIS — N401 Enlarged prostate with lower urinary tract symptoms: Secondary | ICD-10-CM | POA: Diagnosis not present

## 2022-08-25 DIAGNOSIS — J4520 Mild intermittent asthma, uncomplicated: Secondary | ICD-10-CM | POA: Diagnosis not present

## 2022-08-25 DIAGNOSIS — I129 Hypertensive chronic kidney disease with stage 1 through stage 4 chronic kidney disease, or unspecified chronic kidney disease: Secondary | ICD-10-CM | POA: Diagnosis not present

## 2022-08-25 DIAGNOSIS — N1831 Chronic kidney disease, stage 3a: Secondary | ICD-10-CM | POA: Diagnosis not present

## 2022-08-25 DIAGNOSIS — I714 Abdominal aortic aneurysm, without rupture, unspecified: Secondary | ICD-10-CM | POA: Diagnosis not present

## 2022-08-25 DIAGNOSIS — G8929 Other chronic pain: Secondary | ICD-10-CM | POA: Diagnosis not present

## 2022-08-25 DIAGNOSIS — L89152 Pressure ulcer of sacral region, stage 2: Secondary | ICD-10-CM | POA: Diagnosis not present

## 2022-08-25 DIAGNOSIS — I739 Peripheral vascular disease, unspecified: Secondary | ICD-10-CM | POA: Diagnosis not present

## 2022-08-25 DIAGNOSIS — I2510 Atherosclerotic heart disease of native coronary artery without angina pectoris: Secondary | ICD-10-CM | POA: Diagnosis not present

## 2022-08-25 DIAGNOSIS — I69354 Hemiplegia and hemiparesis following cerebral infarction affecting left non-dominant side: Secondary | ICD-10-CM | POA: Diagnosis not present

## 2022-08-27 DIAGNOSIS — J4520 Mild intermittent asthma, uncomplicated: Secondary | ICD-10-CM | POA: Diagnosis not present

## 2022-08-27 DIAGNOSIS — I129 Hypertensive chronic kidney disease with stage 1 through stage 4 chronic kidney disease, or unspecified chronic kidney disease: Secondary | ICD-10-CM | POA: Diagnosis not present

## 2022-08-27 DIAGNOSIS — I69354 Hemiplegia and hemiparesis following cerebral infarction affecting left non-dominant side: Secondary | ICD-10-CM | POA: Diagnosis not present

## 2022-08-27 DIAGNOSIS — I2510 Atherosclerotic heart disease of native coronary artery without angina pectoris: Secondary | ICD-10-CM | POA: Diagnosis not present

## 2022-08-27 DIAGNOSIS — N1831 Chronic kidney disease, stage 3a: Secondary | ICD-10-CM | POA: Diagnosis not present

## 2022-08-27 DIAGNOSIS — G8929 Other chronic pain: Secondary | ICD-10-CM | POA: Diagnosis not present

## 2022-08-27 DIAGNOSIS — I739 Peripheral vascular disease, unspecified: Secondary | ICD-10-CM | POA: Diagnosis not present

## 2022-08-27 DIAGNOSIS — I714 Abdominal aortic aneurysm, without rupture, unspecified: Secondary | ICD-10-CM | POA: Diagnosis not present

## 2022-08-27 DIAGNOSIS — L89152 Pressure ulcer of sacral region, stage 2: Secondary | ICD-10-CM | POA: Diagnosis not present

## 2022-08-29 DIAGNOSIS — J4520 Mild intermittent asthma, uncomplicated: Secondary | ICD-10-CM | POA: Diagnosis not present

## 2022-08-29 DIAGNOSIS — I2510 Atherosclerotic heart disease of native coronary artery without angina pectoris: Secondary | ICD-10-CM | POA: Diagnosis not present

## 2022-08-29 DIAGNOSIS — I69354 Hemiplegia and hemiparesis following cerebral infarction affecting left non-dominant side: Secondary | ICD-10-CM | POA: Diagnosis not present

## 2022-08-29 DIAGNOSIS — G8929 Other chronic pain: Secondary | ICD-10-CM | POA: Diagnosis not present

## 2022-08-29 DIAGNOSIS — I739 Peripheral vascular disease, unspecified: Secondary | ICD-10-CM | POA: Diagnosis not present

## 2022-08-29 DIAGNOSIS — N1831 Chronic kidney disease, stage 3a: Secondary | ICD-10-CM | POA: Diagnosis not present

## 2022-08-29 DIAGNOSIS — I714 Abdominal aortic aneurysm, without rupture, unspecified: Secondary | ICD-10-CM | POA: Diagnosis not present

## 2022-08-29 DIAGNOSIS — L89152 Pressure ulcer of sacral region, stage 2: Secondary | ICD-10-CM | POA: Diagnosis not present

## 2022-08-29 DIAGNOSIS — I129 Hypertensive chronic kidney disease with stage 1 through stage 4 chronic kidney disease, or unspecified chronic kidney disease: Secondary | ICD-10-CM | POA: Diagnosis not present

## 2022-09-02 DIAGNOSIS — I69354 Hemiplegia and hemiparesis following cerebral infarction affecting left non-dominant side: Secondary | ICD-10-CM | POA: Diagnosis not present

## 2022-09-02 DIAGNOSIS — I714 Abdominal aortic aneurysm, without rupture, unspecified: Secondary | ICD-10-CM | POA: Diagnosis not present

## 2022-09-02 DIAGNOSIS — I2510 Atherosclerotic heart disease of native coronary artery without angina pectoris: Secondary | ICD-10-CM | POA: Diagnosis not present

## 2022-09-02 DIAGNOSIS — J4520 Mild intermittent asthma, uncomplicated: Secondary | ICD-10-CM | POA: Diagnosis not present

## 2022-09-02 DIAGNOSIS — G8929 Other chronic pain: Secondary | ICD-10-CM | POA: Diagnosis not present

## 2022-09-02 DIAGNOSIS — I129 Hypertensive chronic kidney disease with stage 1 through stage 4 chronic kidney disease, or unspecified chronic kidney disease: Secondary | ICD-10-CM | POA: Diagnosis not present

## 2022-09-02 DIAGNOSIS — N1831 Chronic kidney disease, stage 3a: Secondary | ICD-10-CM | POA: Diagnosis not present

## 2022-09-02 DIAGNOSIS — L89152 Pressure ulcer of sacral region, stage 2: Secondary | ICD-10-CM | POA: Diagnosis not present

## 2022-09-02 DIAGNOSIS — I739 Peripheral vascular disease, unspecified: Secondary | ICD-10-CM | POA: Diagnosis not present

## 2022-09-03 DIAGNOSIS — I129 Hypertensive chronic kidney disease with stage 1 through stage 4 chronic kidney disease, or unspecified chronic kidney disease: Secondary | ICD-10-CM | POA: Diagnosis not present

## 2022-09-03 DIAGNOSIS — L89152 Pressure ulcer of sacral region, stage 2: Secondary | ICD-10-CM | POA: Diagnosis not present

## 2022-09-03 DIAGNOSIS — I739 Peripheral vascular disease, unspecified: Secondary | ICD-10-CM | POA: Diagnosis not present

## 2022-09-03 DIAGNOSIS — I714 Abdominal aortic aneurysm, without rupture, unspecified: Secondary | ICD-10-CM | POA: Diagnosis not present

## 2022-09-03 DIAGNOSIS — G8929 Other chronic pain: Secondary | ICD-10-CM | POA: Diagnosis not present

## 2022-09-03 DIAGNOSIS — I69354 Hemiplegia and hemiparesis following cerebral infarction affecting left non-dominant side: Secondary | ICD-10-CM | POA: Diagnosis not present

## 2022-09-03 DIAGNOSIS — N1831 Chronic kidney disease, stage 3a: Secondary | ICD-10-CM | POA: Diagnosis not present

## 2022-09-03 DIAGNOSIS — I2510 Atherosclerotic heart disease of native coronary artery without angina pectoris: Secondary | ICD-10-CM | POA: Diagnosis not present

## 2022-09-03 DIAGNOSIS — J4520 Mild intermittent asthma, uncomplicated: Secondary | ICD-10-CM | POA: Diagnosis not present

## 2022-09-05 DIAGNOSIS — G8929 Other chronic pain: Secondary | ICD-10-CM | POA: Diagnosis not present

## 2022-09-05 DIAGNOSIS — I714 Abdominal aortic aneurysm, without rupture, unspecified: Secondary | ICD-10-CM | POA: Diagnosis not present

## 2022-09-05 DIAGNOSIS — I129 Hypertensive chronic kidney disease with stage 1 through stage 4 chronic kidney disease, or unspecified chronic kidney disease: Secondary | ICD-10-CM | POA: Diagnosis not present

## 2022-09-05 DIAGNOSIS — I2510 Atherosclerotic heart disease of native coronary artery without angina pectoris: Secondary | ICD-10-CM | POA: Diagnosis not present

## 2022-09-05 DIAGNOSIS — J4520 Mild intermittent asthma, uncomplicated: Secondary | ICD-10-CM | POA: Diagnosis not present

## 2022-09-05 DIAGNOSIS — I739 Peripheral vascular disease, unspecified: Secondary | ICD-10-CM | POA: Diagnosis not present

## 2022-09-05 DIAGNOSIS — L89152 Pressure ulcer of sacral region, stage 2: Secondary | ICD-10-CM | POA: Diagnosis not present

## 2022-09-05 DIAGNOSIS — I69354 Hemiplegia and hemiparesis following cerebral infarction affecting left non-dominant side: Secondary | ICD-10-CM | POA: Diagnosis not present

## 2022-09-05 DIAGNOSIS — N1831 Chronic kidney disease, stage 3a: Secondary | ICD-10-CM | POA: Diagnosis not present

## 2022-09-10 DIAGNOSIS — I2510 Atherosclerotic heart disease of native coronary artery without angina pectoris: Secondary | ICD-10-CM | POA: Diagnosis not present

## 2022-09-10 DIAGNOSIS — I69354 Hemiplegia and hemiparesis following cerebral infarction affecting left non-dominant side: Secondary | ICD-10-CM | POA: Diagnosis not present

## 2022-09-10 DIAGNOSIS — I129 Hypertensive chronic kidney disease with stage 1 through stage 4 chronic kidney disease, or unspecified chronic kidney disease: Secondary | ICD-10-CM | POA: Diagnosis not present

## 2022-09-10 DIAGNOSIS — N1831 Chronic kidney disease, stage 3a: Secondary | ICD-10-CM | POA: Diagnosis not present

## 2022-09-10 DIAGNOSIS — L89152 Pressure ulcer of sacral region, stage 2: Secondary | ICD-10-CM | POA: Diagnosis not present

## 2022-09-10 DIAGNOSIS — I739 Peripheral vascular disease, unspecified: Secondary | ICD-10-CM | POA: Diagnosis not present

## 2022-09-10 DIAGNOSIS — I714 Abdominal aortic aneurysm, without rupture, unspecified: Secondary | ICD-10-CM | POA: Diagnosis not present

## 2022-09-10 DIAGNOSIS — G8929 Other chronic pain: Secondary | ICD-10-CM | POA: Diagnosis not present

## 2022-09-10 DIAGNOSIS — J4520 Mild intermittent asthma, uncomplicated: Secondary | ICD-10-CM | POA: Diagnosis not present

## 2022-09-12 DIAGNOSIS — I714 Abdominal aortic aneurysm, without rupture, unspecified: Secondary | ICD-10-CM | POA: Diagnosis not present

## 2022-09-12 DIAGNOSIS — I69354 Hemiplegia and hemiparesis following cerebral infarction affecting left non-dominant side: Secondary | ICD-10-CM | POA: Diagnosis not present

## 2022-09-12 DIAGNOSIS — I129 Hypertensive chronic kidney disease with stage 1 through stage 4 chronic kidney disease, or unspecified chronic kidney disease: Secondary | ICD-10-CM | POA: Diagnosis not present

## 2022-09-12 DIAGNOSIS — L89152 Pressure ulcer of sacral region, stage 2: Secondary | ICD-10-CM | POA: Diagnosis not present

## 2022-09-12 DIAGNOSIS — J4520 Mild intermittent asthma, uncomplicated: Secondary | ICD-10-CM | POA: Diagnosis not present

## 2022-09-12 DIAGNOSIS — G8929 Other chronic pain: Secondary | ICD-10-CM | POA: Diagnosis not present

## 2022-09-12 DIAGNOSIS — I739 Peripheral vascular disease, unspecified: Secondary | ICD-10-CM | POA: Diagnosis not present

## 2022-09-12 DIAGNOSIS — I2510 Atherosclerotic heart disease of native coronary artery without angina pectoris: Secondary | ICD-10-CM | POA: Diagnosis not present

## 2022-09-12 DIAGNOSIS — N1831 Chronic kidney disease, stage 3a: Secondary | ICD-10-CM | POA: Diagnosis not present

## 2022-09-18 ENCOUNTER — Encounter (HOSPITAL_COMMUNITY): Payer: Self-pay | Admitting: *Deleted

## 2022-09-18 ENCOUNTER — Emergency Department (HOSPITAL_COMMUNITY)
Admission: EM | Admit: 2022-09-18 | Discharge: 2022-09-18 | Disposition: A | Discharge status: Home / Self Care | Payer: Medicare HMO | Attending: Emergency Medicine | Admitting: Emergency Medicine

## 2022-09-18 ENCOUNTER — Emergency Department (HOSPITAL_COMMUNITY): Payer: Medicare HMO

## 2022-09-18 ENCOUNTER — Other Ambulatory Visit: Payer: Self-pay

## 2022-09-18 DIAGNOSIS — Z79899 Other long term (current) drug therapy: Secondary | ICD-10-CM | POA: Insufficient documentation

## 2022-09-18 DIAGNOSIS — I129 Hypertensive chronic kidney disease with stage 1 through stage 4 chronic kidney disease, or unspecified chronic kidney disease: Secondary | ICD-10-CM | POA: Diagnosis not present

## 2022-09-18 DIAGNOSIS — R072 Precordial pain: Secondary | ICD-10-CM | POA: Diagnosis not present

## 2022-09-18 DIAGNOSIS — I10 Essential (primary) hypertension: Secondary | ICD-10-CM | POA: Insufficient documentation

## 2022-09-18 DIAGNOSIS — N1831 Chronic kidney disease, stage 3a: Secondary | ICD-10-CM | POA: Diagnosis not present

## 2022-09-18 DIAGNOSIS — I714 Abdominal aortic aneurysm, without rupture, unspecified: Secondary | ICD-10-CM | POA: Diagnosis not present

## 2022-09-18 DIAGNOSIS — J4520 Mild intermittent asthma, uncomplicated: Secondary | ICD-10-CM | POA: Diagnosis not present

## 2022-09-18 DIAGNOSIS — R739 Hyperglycemia, unspecified: Secondary | ICD-10-CM | POA: Diagnosis not present

## 2022-09-18 DIAGNOSIS — Z7902 Long term (current) use of antithrombotics/antiplatelets: Secondary | ICD-10-CM | POA: Diagnosis not present

## 2022-09-18 DIAGNOSIS — R079 Chest pain, unspecified: Secondary | ICD-10-CM | POA: Diagnosis not present

## 2022-09-18 DIAGNOSIS — M25519 Pain in unspecified shoulder: Secondary | ICD-10-CM | POA: Diagnosis not present

## 2022-09-18 DIAGNOSIS — I739 Peripheral vascular disease, unspecified: Secondary | ICD-10-CM | POA: Diagnosis not present

## 2022-09-18 DIAGNOSIS — I959 Hypotension, unspecified: Secondary | ICD-10-CM | POA: Diagnosis not present

## 2022-09-18 DIAGNOSIS — I2510 Atherosclerotic heart disease of native coronary artery without angina pectoris: Secondary | ICD-10-CM | POA: Insufficient documentation

## 2022-09-18 DIAGNOSIS — R0789 Other chest pain: Secondary | ICD-10-CM | POA: Diagnosis not present

## 2022-09-18 DIAGNOSIS — Z7401 Bed confinement status: Secondary | ICD-10-CM | POA: Diagnosis not present

## 2022-09-18 DIAGNOSIS — G459 Transient cerebral ischemic attack, unspecified: Secondary | ICD-10-CM | POA: Diagnosis not present

## 2022-09-18 DIAGNOSIS — Z7982 Long term (current) use of aspirin: Secondary | ICD-10-CM | POA: Insufficient documentation

## 2022-09-18 DIAGNOSIS — I69354 Hemiplegia and hemiparesis following cerebral infarction affecting left non-dominant side: Secondary | ICD-10-CM | POA: Diagnosis not present

## 2022-09-18 DIAGNOSIS — Z8673 Personal history of transient ischemic attack (TIA), and cerebral infarction without residual deficits: Secondary | ICD-10-CM | POA: Insufficient documentation

## 2022-09-18 DIAGNOSIS — L89152 Pressure ulcer of sacral region, stage 2: Secondary | ICD-10-CM | POA: Diagnosis not present

## 2022-09-18 DIAGNOSIS — G8929 Other chronic pain: Secondary | ICD-10-CM | POA: Diagnosis not present

## 2022-09-18 LAB — CBC
HCT: 40.1 % (ref 39.0–52.0)
Hemoglobin: 13.5 g/dL (ref 13.0–17.0)
MCH: 33 pg (ref 26.0–34.0)
MCHC: 33.7 g/dL (ref 30.0–36.0)
MCV: 98 fL (ref 80.0–100.0)
Platelets: 256 10*3/uL (ref 150–400)
RBC: 4.09 MIL/uL — ABNORMAL LOW (ref 4.22–5.81)
RDW: 12.6 % (ref 11.5–15.5)
WBC: 6.6 10*3/uL (ref 4.0–10.5)
nRBC: 0 % (ref 0.0–0.2)

## 2022-09-18 LAB — BASIC METABOLIC PANEL
Anion gap: 10 (ref 5–15)
BUN: 22 mg/dL (ref 8–23)
CO2: 22 mmol/L (ref 22–32)
Calcium: 8.8 mg/dL — ABNORMAL LOW (ref 8.9–10.3)
Chloride: 100 mmol/L (ref 98–111)
Creatinine, Ser: 0.96 mg/dL (ref 0.61–1.24)
GFR, Estimated: >60 mL/min (ref >60)
Glucose, Bld: 110 mg/dL — ABNORMAL HIGH (ref 70–99)
Potassium: 4.1 mmol/L (ref 3.5–5.1)
Sodium: 132 mmol/L — ABNORMAL LOW (ref 135–145)

## 2022-09-18 LAB — TROPONIN I (HIGH SENSITIVITY)
Troponin I (High Sensitivity): 8 ng/L (ref <18)
Troponin I (High Sensitivity): 6 ng/L (ref <18)

## 2022-09-18 NOTE — Discharge Instructions (Signed)
It was our pleasure to provide your ER care today - we hope that you feel better.  Take acetaminophen as need.   Follow up with primary care doctor/cardiologist in the coming week.  Return to ER if worse, new symptoms, persistent/recurrent chest pain, increased trouble breathing, or other emergency concern.

## 2022-09-18 NOTE — ED Notes (Signed)
Pt clarified and states his black jacket was left on a hospital wheelchair. I have done a round of the department and cannot locate this jacket.

## 2022-09-18 NOTE — ED Triage Notes (Signed)
Patient with onset of chest pain and left shoulder pain at midnight.  He took '650mg'$  of aspirin and nitro x 1.  He is no longer in pain.  NSR on monitor.  His pressure is lower than his normal.  CBG 350, no known hx of diabetes.  96% on room air.

## 2022-09-18 NOTE — ED Provider Notes (Signed)
[facility] Provider Note   CSN: [id number] Arrival date & time: 09/18/22  0348     History  Chief Complaint  Patient presents with   Chest Pain    Howard Jackson is a 77 y.o. male.  The history is provided by the patient.   Patient with history of CAD, CVA with left-sided hemiparesis presents with chest pain.  Patient reports pain in the right side of his chest that radiates to his left shoulder.  It started yesterday and resolved.  This episode started several hours ago.  He has taken aspirin nitroglycerin.  He is feeling improved. Patient reports some of this pain is worse with movement No other significant associated symptoms, no fevers or vomiting.  No shortness of breath.   Past Medical History:  Diagnosis Date   Adrenal gland disorder ([place])    tumor present - no change-  no recent increase     Arthritis    Benign pheochromocytoma of right adrenal gland 06/23/2014   Never had histological diagnosis, but reportedly initially diagnosed in [place]    left foot   Complication of anesthesia    "died on the table during cervical fusion" '89 - believed to be d/t "too much medication" to treat HTN and was later dx with pheochromocytoma   Coronary artery disease    cleared for surg. by Dr. Gwenlyn Found - [facility]   H/O hiatal hernia    Hammer toe    Herniated lumbar intervertebral disc    History of anxiety    Hyperlipidemia    Hypertension    Neurogenic bladder    Neuromuscular disorder ([place])    carpal tunnel problem, even after surgery release    Peripheral arterial disease ([place])    Primary aldosteronism ([place])    Stroke ([place])    in [place], L sided weakness, wears a brace on L leg & uses cane    Home Medications Prior to Admission medications   Medication Sig Start Date End Date Taking? Authorizing Provider  aspirin 325 MG tablet Take 650 mg by mouth once.    [provider]  Baclofen 5 MG TABS Take 5 mg by  mouth as needed.    [provider]  Cholecalciferol (VITAMIN D3) 125 MCG (5000 UT) CAPS Take 5,000 Units by mouth as needed.    [provider]  CITRUS BERGAMOT PO Take by mouth.    [provider]  clopidogrel (PLAVIX) 75 MG tablet TAKE 1 TABLET BY MOUTH EVERY DAY 10/28/21   Croitoru, Mihai, MD  Coenzyme Q10-Vitamin E (QUNOL ULTRA COQ10) 100-150 MG-UNIT CAPS Take by mouth.    [provider]  cyclobenzaprine (FLEXERIL) 5 MG tablet Take 5 mg by mouth 3 (three) times daily as needed. 11/09/21   [provider]  famotidine (PEPCID) 10 MG tablet Take 10 mg by mouth 2 (two) times daily as needed.    [provider]  Feverfew 380 MG CAPS Take 380 mg by mouth as needed.    [provider]  finasteride (PROSCAR) 5 MG tablet Take 5 mg by mouth daily. 01/19/18   [provider]  FLOVENT HFA 110 MCG/ACT inhaler Inhale 1 puff into the lungs 3 (three) times daily.  04/13/14   [provider]  labetalol (NORMODYNE) 300 MG tablet TAKE 1 TABLET BY MOUTH 2 TIMES DAILY. 05/08/21   Croitoru, Mihai, MD  lactobacillus acidophilus (BACID) TABS tablet Take 2 tablets by mouth 2 (  two) times daily. 04/20/18   Alma Friendly, MD  mupirocin ointment (BACTROBAN) 2 % Apply 1 application  topically as needed. 05/26/19   [provider]  NIFEdipine (ADALAT CC) 60 MG 24 hr tablet Take 1 tablet (60 mg total) by mouth every evening. 04/29/22   Croitoru, Mihai, MD  pantoprazole (PROTONIX) 40 MG tablet 40 mg as needed (heartburn). 11/30/19   [provider]  rosuvastatin (CRESTOR) 20 MG tablet [place] LOS DIAS Patient taking differently: Take 20 mg by mouth daily. 01/28/21   Croitoru, Mihai, MD  senna-docusate (SENOKOT-S) 8.6-50 MG tablet Take 3 tablets by mouth every evening. 04/20/18   Alma Friendly, MD  spironolactone (ALDACTONE) 100 MG tablet Take 1 tablet (100 mg total) by mouth daily. 04/29/22   Croitoru,  Mihai, MD  tamsulosin (FLOMAX) 0.4 MG CAPS capsule Take 1 capsule (0.4 mg total) by mouth daily. 01/09/17   Dorena Dew, FNP  tiZANidine (ZANAFLEX) 4 MG tablet TAKE 2 TABLETS BY MOUTH AT BEDTIME AS NEEDED 03/16/19   [provider]      Allergies    Elavil [amitriptyline hcl], Losartan potassium, and Sulfa antibiotics    Review of Systems   Review of Systems  Constitutional:  Negative for fever.  Respiratory:  Negative for shortness of breath.   Cardiovascular:  Positive for chest pain.  Gastrointestinal:  Negative for vomiting.    Physical Exam Updated Vital Signs BP 104/60   Pulse 69   Temp 98.7 F (37.1 C) (Oral)   Resp 20   Ht 1.702 m ('5\' 7"'$ )   Wt 59.9 kg   SpO2 99%   BMI 20.67 kg/m  Physical Exam CONSTITUTIONAL: Elderly, no acute distress HEAD: Normocephalic/atraumatic EYES: EOMI/PERRL ENMT: Mucous membranes moist NECK: supple no meningeal signs SPINE/BACK:entire spine nontender CV: S1/S2 noted, no murmurs/rubs/gallops noted LUNGS: Lungs are clear to auscultation bilaterally, no apparent distress Chest-mild tenderness to right chest, no bruising or crepitus ABDOMEN: soft, nontender NEURO: Pt is awake/alert/appropriate, chronic left-sided hemiparesis EXTREMITIES: pulses normal/equal, full ROM Mild tenderness to right shoulder.  No erythema or deformities.  He is able to range the right shoulder SKIN: warm, color normal PSYCH: no abnormalities of mood noted, alert and oriented to situation  ED Results / Procedures / Treatments   Labs (all labs ordered are listed, but only abnormal results are displayed) Labs Reviewed  BASIC METABOLIC PANEL - Abnormal; Notable for the following components:      Result Value   Sodium 132 (*)    Glucose, Bld 110 (*)    Calcium 8.8 (*)    All other components within normal limits  CBC - Abnormal; Notable for the following components:   RBC 4.09 (*)    All other components within normal limits  TROPONIN I (HIGH  SENSITIVITY)  TROPONIN I (HIGH SENSITIVITY)    EKG EKG Interpretation  Date/Time:  Thursday September 18 2022 04:01:52 EST Ventricular Rate:  64 PR Interval:  134 QRS Duration: 102 QT Interval:  406 QTC Calculation: 418 R Axis:   -58 Text Interpretation: Sinus rhythm with Premature atrial complexes Left axis deviation Abnormal ECG No significant change since last tracing Confirmed by Ripley Fraise [phone]) on 09/18/2022 4:06:49 AM  Radiology No results found.  Procedures Procedures    Medications Ordered in ED Medications - No data to display  ED Course/ Medical Decision Making/ A&P Clinical Course as of 09/18/22 0652  Thu Sep 18, 2022  0617 Patient resting comfortably, no new complaints.  Will obtain repeat troponin. [DW]  [phone] Signed out to dr Ashok Cordia at shift change. If troponin he can be discharged [DW]    Clinical Course User Index [DW] Ripley Fraise, MD                             Medical Decision Making Amount and/or Complexity of Data Reviewed Labs: ordered. Radiology: ordered.   This patient presents to the ED for concern of chest pain, this involves an extensive number of treatment options, and is a complaint that carries with it a high risk of complications and morbidity.  The differential diagnosis includes but is not limited to acute coronary syndrome, aortic dissection, pulmonary embolism, pericarditis, pneumothorax, pneumonia, myocarditis, pleurisy, esophageal rupture   Comorbidities that complicate the patient evaluation: Patient's presentation is complicated by their history of CVA and CAD  Social Determinants of Health: Patient's  poor mobility, English is a second language   increases the complexity of managing their presentation  Additional history obtained: Additional history obtained from EMS  Records reviewed previous admission documents  Lab Tests: I Ordered, and personally interpreted labs.  The pertinent results include: Overall  unremarkable  Imaging Studies ordered: I ordered imaging studies including X-ray chest x-ray   I independently visualized and interpreted imaging which showed no acute findings I agree with the radiologist interpretation  Cardiac Monitoring: The patient was maintained on a cardiac monitor.  I personally viewed and interpreted the cardiac monitor which showed an underlying rhythm of:  sinus rhythm   Reevaluation: After the interventions noted above, I reevaluated the patient and found that they have :improved  Complexity of problems addressed: Patient's presentation is most consistent with  acute presentation with potential threat to life or bodily function          Final Clinical Impression(s) / ED Diagnoses Final diagnoses:  Precordial pain    Rx / DC Orders ED Discharge Orders     None         Ripley Fraise, MD 09/18/22 [phone]

## 2022-09-18 NOTE — ED Notes (Signed)
Introduced self to pt. Pt states that he was brought in via EMS, pt states they didn't give him his wheelchair or jacket.

## 2022-09-18 NOTE — ED Notes (Signed)
Checked with EMS- they state that they did not transport pt's power chair nor jacket.

## 2022-09-18 NOTE — ED Provider Notes (Signed)
Signed out that plan is for d/c to home if/when 2nd trop normal.  Delta trop normal. Vitals normal. Pt alert, no pain, no trouble breathing.  Pt currently appears stable for d/c per Dr Neldon Labella plan.      Lajean Saver, MD 09/18/22 [phone]

## 2022-09-19 DIAGNOSIS — G8929 Other chronic pain: Secondary | ICD-10-CM | POA: Diagnosis not present

## 2022-09-19 DIAGNOSIS — I714 Abdominal aortic aneurysm, without rupture, unspecified: Secondary | ICD-10-CM | POA: Diagnosis not present

## 2022-09-19 DIAGNOSIS — I129 Hypertensive chronic kidney disease with stage 1 through stage 4 chronic kidney disease, or unspecified chronic kidney disease: Secondary | ICD-10-CM | POA: Diagnosis not present

## 2022-09-19 DIAGNOSIS — L89152 Pressure ulcer of sacral region, stage 2: Secondary | ICD-10-CM | POA: Diagnosis not present

## 2022-09-19 DIAGNOSIS — I69354 Hemiplegia and hemiparesis following cerebral infarction affecting left non-dominant side: Secondary | ICD-10-CM | POA: Diagnosis not present

## 2022-09-19 DIAGNOSIS — N1831 Chronic kidney disease, stage 3a: Secondary | ICD-10-CM | POA: Diagnosis not present

## 2022-09-19 DIAGNOSIS — I739 Peripheral vascular disease, unspecified: Secondary | ICD-10-CM | POA: Diagnosis not present

## 2022-09-19 DIAGNOSIS — I2510 Atherosclerotic heart disease of native coronary artery without angina pectoris: Secondary | ICD-10-CM | POA: Diagnosis not present

## 2022-09-19 DIAGNOSIS — J4520 Mild intermittent asthma, uncomplicated: Secondary | ICD-10-CM | POA: Diagnosis not present

## 2022-09-22 DIAGNOSIS — R739 Hyperglycemia, unspecified: Secondary | ICD-10-CM | POA: Diagnosis not present

## 2022-09-22 DIAGNOSIS — N1831 Chronic kidney disease, stage 3a: Secondary | ICD-10-CM | POA: Diagnosis not present

## 2022-09-22 DIAGNOSIS — Z6821 Body mass index (BMI) 21.0-21.9, adult: Secondary | ICD-10-CM | POA: Diagnosis not present

## 2022-09-22 DIAGNOSIS — I10 Essential (primary) hypertension: Secondary | ICD-10-CM | POA: Diagnosis not present

## 2022-09-22 DIAGNOSIS — Z09 Encounter for follow-up examination after completed treatment for conditions other than malignant neoplasm: Secondary | ICD-10-CM | POA: Diagnosis not present

## 2022-09-23 DIAGNOSIS — I129 Hypertensive chronic kidney disease with stage 1 through stage 4 chronic kidney disease, or unspecified chronic kidney disease: Secondary | ICD-10-CM | POA: Diagnosis not present

## 2022-09-23 DIAGNOSIS — I2510 Atherosclerotic heart disease of native coronary artery without angina pectoris: Secondary | ICD-10-CM | POA: Diagnosis not present

## 2022-09-23 DIAGNOSIS — L89152 Pressure ulcer of sacral region, stage 2: Secondary | ICD-10-CM | POA: Diagnosis not present

## 2022-09-23 DIAGNOSIS — I739 Peripheral vascular disease, unspecified: Secondary | ICD-10-CM | POA: Diagnosis not present

## 2022-09-23 DIAGNOSIS — N1831 Chronic kidney disease, stage 3a: Secondary | ICD-10-CM | POA: Diagnosis not present

## 2022-09-23 DIAGNOSIS — G8929 Other chronic pain: Secondary | ICD-10-CM | POA: Diagnosis not present

## 2022-09-23 DIAGNOSIS — J4520 Mild intermittent asthma, uncomplicated: Secondary | ICD-10-CM | POA: Diagnosis not present

## 2022-09-23 DIAGNOSIS — I714 Abdominal aortic aneurysm, without rupture, unspecified: Secondary | ICD-10-CM | POA: Diagnosis not present

## 2022-09-23 DIAGNOSIS — I69354 Hemiplegia and hemiparesis following cerebral infarction affecting left non-dominant side: Secondary | ICD-10-CM | POA: Diagnosis not present

## 2022-09-25 DIAGNOSIS — I714 Abdominal aortic aneurysm, without rupture, unspecified: Secondary | ICD-10-CM | POA: Diagnosis not present

## 2022-09-25 DIAGNOSIS — I2510 Atherosclerotic heart disease of native coronary artery without angina pectoris: Secondary | ICD-10-CM | POA: Diagnosis not present

## 2022-09-25 DIAGNOSIS — J4520 Mild intermittent asthma, uncomplicated: Secondary | ICD-10-CM | POA: Diagnosis not present

## 2022-09-25 DIAGNOSIS — I69354 Hemiplegia and hemiparesis following cerebral infarction affecting left non-dominant side: Secondary | ICD-10-CM | POA: Diagnosis not present

## 2022-09-25 DIAGNOSIS — I129 Hypertensive chronic kidney disease with stage 1 through stage 4 chronic kidney disease, or unspecified chronic kidney disease: Secondary | ICD-10-CM | POA: Diagnosis not present

## 2022-09-25 DIAGNOSIS — N1831 Chronic kidney disease, stage 3a: Secondary | ICD-10-CM | POA: Diagnosis not present

## 2022-09-25 DIAGNOSIS — G8929 Other chronic pain: Secondary | ICD-10-CM | POA: Diagnosis not present

## 2022-09-25 DIAGNOSIS — I739 Peripheral vascular disease, unspecified: Secondary | ICD-10-CM | POA: Diagnosis not present

## 2022-09-25 DIAGNOSIS — L89152 Pressure ulcer of sacral region, stage 2: Secondary | ICD-10-CM | POA: Diagnosis not present

## 2022-09-30 DIAGNOSIS — I714 Abdominal aortic aneurysm, without rupture, unspecified: Secondary | ICD-10-CM | POA: Diagnosis not present

## 2022-09-30 DIAGNOSIS — J4520 Mild intermittent asthma, uncomplicated: Secondary | ICD-10-CM | POA: Diagnosis not present

## 2022-09-30 DIAGNOSIS — I69354 Hemiplegia and hemiparesis following cerebral infarction affecting left non-dominant side: Secondary | ICD-10-CM | POA: Diagnosis not present

## 2022-09-30 DIAGNOSIS — L89152 Pressure ulcer of sacral region, stage 2: Secondary | ICD-10-CM | POA: Diagnosis not present

## 2022-09-30 DIAGNOSIS — G8929 Other chronic pain: Secondary | ICD-10-CM | POA: Diagnosis not present

## 2022-09-30 DIAGNOSIS — I739 Peripheral vascular disease, unspecified: Secondary | ICD-10-CM | POA: Diagnosis not present

## 2022-09-30 DIAGNOSIS — I2510 Atherosclerotic heart disease of native coronary artery without angina pectoris: Secondary | ICD-10-CM | POA: Diagnosis not present

## 2022-09-30 DIAGNOSIS — I129 Hypertensive chronic kidney disease with stage 1 through stage 4 chronic kidney disease, or unspecified chronic kidney disease: Secondary | ICD-10-CM | POA: Diagnosis not present

## 2022-09-30 DIAGNOSIS — N1831 Chronic kidney disease, stage 3a: Secondary | ICD-10-CM | POA: Diagnosis not present

## 2022-10-10 ENCOUNTER — Ambulatory Visit: Payer: Medicare HMO | Admitting: Student

## 2022-10-10 DIAGNOSIS — L89152 Pressure ulcer of sacral region, stage 2: Secondary | ICD-10-CM | POA: Diagnosis not present

## 2022-10-10 DIAGNOSIS — I739 Peripheral vascular disease, unspecified: Secondary | ICD-10-CM | POA: Diagnosis not present

## 2022-10-10 DIAGNOSIS — I2510 Atherosclerotic heart disease of native coronary artery without angina pectoris: Secondary | ICD-10-CM | POA: Diagnosis not present

## 2022-10-10 DIAGNOSIS — J4520 Mild intermittent asthma, uncomplicated: Secondary | ICD-10-CM | POA: Diagnosis not present

## 2022-10-10 DIAGNOSIS — I129 Hypertensive chronic kidney disease with stage 1 through stage 4 chronic kidney disease, or unspecified chronic kidney disease: Secondary | ICD-10-CM | POA: Diagnosis not present

## 2022-10-10 DIAGNOSIS — I69354 Hemiplegia and hemiparesis following cerebral infarction affecting left non-dominant side: Secondary | ICD-10-CM | POA: Diagnosis not present

## 2022-10-10 DIAGNOSIS — I714 Abdominal aortic aneurysm, without rupture, unspecified: Secondary | ICD-10-CM | POA: Diagnosis not present

## 2022-10-10 DIAGNOSIS — N1831 Chronic kidney disease, stage 3a: Secondary | ICD-10-CM | POA: Diagnosis not present

## 2022-10-10 DIAGNOSIS — G8929 Other chronic pain: Secondary | ICD-10-CM | POA: Diagnosis not present

## 2022-10-16 DIAGNOSIS — I129 Hypertensive chronic kidney disease with stage 1 through stage 4 chronic kidney disease, or unspecified chronic kidney disease: Secondary | ICD-10-CM | POA: Diagnosis not present

## 2022-10-16 DIAGNOSIS — I714 Abdominal aortic aneurysm, without rupture, unspecified: Secondary | ICD-10-CM | POA: Diagnosis not present

## 2022-10-16 DIAGNOSIS — G8929 Other chronic pain: Secondary | ICD-10-CM | POA: Diagnosis not present

## 2022-10-16 DIAGNOSIS — I69354 Hemiplegia and hemiparesis following cerebral infarction affecting left non-dominant side: Secondary | ICD-10-CM | POA: Diagnosis not present

## 2022-10-16 DIAGNOSIS — M7582 Other shoulder lesions, left shoulder: Secondary | ICD-10-CM | POA: Diagnosis not present

## 2022-10-16 DIAGNOSIS — I2510 Atherosclerotic heart disease of native coronary artery without angina pectoris: Secondary | ICD-10-CM | POA: Diagnosis not present

## 2022-10-16 DIAGNOSIS — I739 Peripheral vascular disease, unspecified: Secondary | ICD-10-CM | POA: Diagnosis not present

## 2022-10-16 DIAGNOSIS — J4520 Mild intermittent asthma, uncomplicated: Secondary | ICD-10-CM | POA: Diagnosis not present

## 2022-10-16 DIAGNOSIS — N1831 Chronic kidney disease, stage 3a: Secondary | ICD-10-CM | POA: Diagnosis not present

## 2022-10-23 DIAGNOSIS — N1831 Chronic kidney disease, stage 3a: Secondary | ICD-10-CM | POA: Diagnosis not present

## 2022-10-23 DIAGNOSIS — I69354 Hemiplegia and hemiparesis following cerebral infarction affecting left non-dominant side: Secondary | ICD-10-CM | POA: Diagnosis not present

## 2022-10-23 DIAGNOSIS — I129 Hypertensive chronic kidney disease with stage 1 through stage 4 chronic kidney disease, or unspecified chronic kidney disease: Secondary | ICD-10-CM | POA: Diagnosis not present

## 2022-10-23 DIAGNOSIS — M7582 Other shoulder lesions, left shoulder: Secondary | ICD-10-CM | POA: Diagnosis not present

## 2022-10-23 DIAGNOSIS — I714 Abdominal aortic aneurysm, without rupture, unspecified: Secondary | ICD-10-CM | POA: Diagnosis not present

## 2022-10-23 DIAGNOSIS — J4520 Mild intermittent asthma, uncomplicated: Secondary | ICD-10-CM | POA: Diagnosis not present

## 2022-10-23 DIAGNOSIS — I2510 Atherosclerotic heart disease of native coronary artery without angina pectoris: Secondary | ICD-10-CM | POA: Diagnosis not present

## 2022-10-23 DIAGNOSIS — I739 Peripheral vascular disease, unspecified: Secondary | ICD-10-CM | POA: Diagnosis not present

## 2022-10-23 DIAGNOSIS — G8929 Other chronic pain: Secondary | ICD-10-CM | POA: Diagnosis not present

## 2022-10-28 DIAGNOSIS — N1831 Chronic kidney disease, stage 3a: Secondary | ICD-10-CM | POA: Diagnosis not present

## 2022-10-28 DIAGNOSIS — M7582 Other shoulder lesions, left shoulder: Secondary | ICD-10-CM | POA: Diagnosis not present

## 2022-10-28 DIAGNOSIS — I69354 Hemiplegia and hemiparesis following cerebral infarction affecting left non-dominant side: Secondary | ICD-10-CM | POA: Diagnosis not present

## 2022-10-28 DIAGNOSIS — I739 Peripheral vascular disease, unspecified: Secondary | ICD-10-CM | POA: Diagnosis not present

## 2022-10-28 DIAGNOSIS — J4520 Mild intermittent asthma, uncomplicated: Secondary | ICD-10-CM | POA: Diagnosis not present

## 2022-10-28 DIAGNOSIS — I2510 Atherosclerotic heart disease of native coronary artery without angina pectoris: Secondary | ICD-10-CM | POA: Diagnosis not present

## 2022-10-28 DIAGNOSIS — G8929 Other chronic pain: Secondary | ICD-10-CM | POA: Diagnosis not present

## 2022-10-28 DIAGNOSIS — I129 Hypertensive chronic kidney disease with stage 1 through stage 4 chronic kidney disease, or unspecified chronic kidney disease: Secondary | ICD-10-CM | POA: Diagnosis not present

## 2022-10-28 DIAGNOSIS — I714 Abdominal aortic aneurysm, without rupture, unspecified: Secondary | ICD-10-CM | POA: Diagnosis not present

## 2022-10-29 DIAGNOSIS — I714 Abdominal aortic aneurysm, without rupture, unspecified: Secondary | ICD-10-CM | POA: Diagnosis not present

## 2022-10-29 DIAGNOSIS — M47816 Spondylosis without myelopathy or radiculopathy, lumbar region: Secondary | ICD-10-CM | POA: Diagnosis not present

## 2022-10-29 DIAGNOSIS — Z981 Arthrodesis status: Secondary | ICD-10-CM | POA: Diagnosis not present

## 2022-10-29 DIAGNOSIS — R051 Acute cough: Secondary | ICD-10-CM | POA: Diagnosis not present

## 2022-10-29 DIAGNOSIS — Z03818 Encounter for observation for suspected exposure to other biological agents ruled out: Secondary | ICD-10-CM | POA: Diagnosis not present

## 2022-10-29 DIAGNOSIS — I69354 Hemiplegia and hemiparesis following cerebral infarction affecting left non-dominant side: Secondary | ICD-10-CM | POA: Diagnosis not present

## 2022-10-29 DIAGNOSIS — R059 Cough, unspecified: Secondary | ICD-10-CM | POA: Diagnosis not present

## 2022-10-29 DIAGNOSIS — J4520 Mild intermittent asthma, uncomplicated: Secondary | ICD-10-CM | POA: Diagnosis not present

## 2022-10-29 DIAGNOSIS — E2609 Other primary hyperaldosteronism: Secondary | ICD-10-CM | POA: Diagnosis not present

## 2022-10-31 ENCOUNTER — Ambulatory Visit: Payer: Medicare HMO | Admitting: Podiatry

## 2022-11-04 ENCOUNTER — Emergency Department (HOSPITAL_COMMUNITY): Payer: Medicare HMO

## 2022-11-04 ENCOUNTER — Other Ambulatory Visit: Payer: Self-pay

## 2022-11-04 ENCOUNTER — Encounter (HOSPITAL_COMMUNITY): Payer: Self-pay

## 2022-11-04 ENCOUNTER — Emergency Department (HOSPITAL_COMMUNITY)
Admission: EM | Admit: 2022-11-04 | Discharge: 2022-11-04 | Disposition: A | Discharge status: Home / Self Care | Payer: Medicare HMO | Attending: Emergency Medicine | Admitting: Emergency Medicine

## 2022-11-04 DIAGNOSIS — W19XXXA Unspecified fall, initial encounter: Secondary | ICD-10-CM | POA: Diagnosis not present

## 2022-11-04 DIAGNOSIS — Z79899 Other long term (current) drug therapy: Secondary | ICD-10-CM | POA: Insufficient documentation

## 2022-11-04 DIAGNOSIS — M545 Low back pain, unspecified: Secondary | ICD-10-CM | POA: Insufficient documentation

## 2022-11-04 DIAGNOSIS — E871 Hypo-osmolality and hyponatremia: Secondary | ICD-10-CM | POA: Diagnosis not present

## 2022-11-04 DIAGNOSIS — Z7982 Long term (current) use of aspirin: Secondary | ICD-10-CM | POA: Diagnosis not present

## 2022-11-04 DIAGNOSIS — M47816 Spondylosis without myelopathy or radiculopathy, lumbar region: Secondary | ICD-10-CM | POA: Diagnosis not present

## 2022-11-04 DIAGNOSIS — M549 Dorsalgia, unspecified: Secondary | ICD-10-CM | POA: Diagnosis not present

## 2022-11-04 DIAGNOSIS — M546 Pain in thoracic spine: Secondary | ICD-10-CM | POA: Diagnosis not present

## 2022-11-04 DIAGNOSIS — Z7902 Long term (current) use of antithrombotics/antiplatelets: Secondary | ICD-10-CM | POA: Diagnosis not present

## 2022-11-04 DIAGNOSIS — I10 Essential (primary) hypertension: Secondary | ICD-10-CM | POA: Insufficient documentation

## 2022-11-04 LAB — CBC WITH DIFFERENTIAL/PLATELET
Abs Immature Granulocytes: 0.06 10*3/uL (ref 0.00–0.07)
Basophils Absolute: 0 10*3/uL (ref 0.0–0.1)
Basophils Relative: 0 %
Eosinophils Absolute: 0.1 10*3/uL (ref 0.0–0.5)
Eosinophils Relative: 1 %
HCT: 41.1 % (ref 39.0–52.0)
Hemoglobin: 14 g/dL (ref 13.0–17.0)
Immature Granulocytes: 1 %
Lymphocytes Relative: 15 %
Lymphs Abs: 1.1 10*3/uL (ref 0.7–4.0)
MCH: 32 pg (ref 26.0–34.0)
MCHC: 34.1 g/dL (ref 30.0–36.0)
MCV: 94.1 fL (ref 80.0–100.0)
Monocytes Absolute: 1 10*3/uL (ref 0.1–1.0)
Monocytes Relative: 13 %
Neutro Abs: 5.2 10*3/uL (ref 1.7–7.7)
Neutrophils Relative %: 70 %
Platelets: 299 10*3/uL (ref 150–400)
RBC: 4.37 MIL/uL (ref 4.22–5.81)
RDW: 13.1 % (ref 11.5–15.5)
WBC: 7.4 10*3/uL (ref 4.0–10.5)
nRBC: 0 % (ref 0.0–0.2)

## 2022-11-04 LAB — HEPATIC FUNCTION PANEL
ALT: 37 U/L (ref 0–44)
AST: 31 U/L (ref 15–41)
Albumin: 3.2 g/dL — ABNORMAL LOW (ref 3.5–5.0)
Alkaline Phosphatase: 69 U/L (ref 38–126)
Bilirubin, Direct: 0.1 mg/dL (ref 0.0–0.2)
Indirect Bilirubin: 0.8 mg/dL (ref 0.3–0.9)
Total Bilirubin: 0.9 mg/dL (ref 0.3–1.2)
Total Protein: 6.2 g/dL — ABNORMAL LOW (ref 6.5–8.1)

## 2022-11-04 LAB — BASIC METABOLIC PANEL
Anion gap: 12 (ref 5–15)
BUN: 18 mg/dL (ref 8–23)
CO2: 21 mmol/L — ABNORMAL LOW (ref 22–32)
Calcium: 9.1 mg/dL (ref 8.9–10.3)
Chloride: 96 mmol/L — ABNORMAL LOW (ref 98–111)
Creatinine, Ser: 0.85 mg/dL (ref 0.61–1.24)
GFR, Estimated: >60 mL/min (ref >60)
Glucose, Bld: 98 mg/dL (ref 70–99)
Potassium: 4.1 mmol/L (ref 3.5–5.1)
Sodium: 129 mmol/L — ABNORMAL LOW (ref 135–145)

## 2022-11-04 LAB — CK: Total CK: 547 U/L — ABNORMAL HIGH (ref 49–397)

## 2022-11-04 LAB — URINALYSIS, ROUTINE W REFLEX MICROSCOPIC
Bilirubin Urine: NEGATIVE
Glucose, UA: NEGATIVE mg/dL
Hgb urine dipstick: NEGATIVE
Ketones, ur: 20 mg/dL — AB
Leukocytes,Ua: NEGATIVE
Nitrite: NEGATIVE
Protein, ur: NEGATIVE mg/dL
Specific Gravity, Urine: 1.012 (ref 1.005–1.030)
pH: 5 (ref 5.0–8.0)

## 2022-11-04 MED ORDER — SODIUM CHLORIDE 0.9 % IV BOLUS
1000.0000 mL | Freq: Once | INTRAVENOUS | Status: AC
  Administered 2022-11-04: 1000 mL via INTRAVENOUS

## 2022-11-04 NOTE — ED Triage Notes (Signed)
EMS reports patient fell last night.  Chronic lower back pain. Patient slipped from wheelchair while trying to get to the bed.  Patient laid on the floor x 12 hours.  Takes plavix no head injury.  Complains of right hip pain but all pain is chronic

## 2022-11-04 NOTE — ED Provider Triage Note (Signed)
Emergency Medicine Provider Triage Evaluation Note  Howard Jackson , a 77 y.o. male  was evaluated in triage.  Patient complains of a fall.  He reports that he slipped out of his wheelchair and was unable to get up due to chronic back pain and right-sided sciatica.  Says that he subsequently laid on the ground for 12 hours and is now having bodyaches as well.  No history of seizure or syncope.  No history of hypotension, is not on insulin.  Endorses a purely mechanical fall.  He is on Plavix but did not hit his head.  Reports history of cervical spinal fusion however no pain in his neck either.  Review of Systems  Positive:  Negative:   Physical Exam  BP 137/78 (BP Location: Left Arm)   Pulse 88   Temp 97.6 F (36.4 C) (Oral)   Resp 18   Ht  (1.702 m)   Wt 59.9 kg   SpO2 97%   BMI 20.67 kg/m  Gen:   Awake, no distress   Resp:  Normal effort  MSK:   Moves extremities without difficulty  Other:  Tenderness to thoracic and lumbar spine midline.  Medical Decision Making  Medically screening exam initiated at 1:00 PM.  Appropriate orders placed.  Howard Jackson was informed that the remainder of the evaluation will be completed by another provider, this initial triage assessment does not replace that evaluation, and the importance of remaining in the ED until their evaluation is complete.     Saddie Benders, PA-C 11/04/22 1301

## 2022-11-04 NOTE — ED Provider Notes (Signed)
[place] EMERGENCY DEPARTMENT AT [facility] Provider Note   CSN: [id number] Arrival date & time: 11/04/22  1218     History  Chief Complaint  Patient presents with   Howard Jackson is a 77 y.o. male with a past medical history of hypertension, CVA, hypertension, hyperlipidemia and peripheral artery disease presenting today after fall.  He reports that last night he slipped out of his wheelchair while he was trying to transfer to his bed.  Denied any dizziness, lightheadedness, seizure or syncope.  Reports that he was unable to get back up into the bed and laid on the floor for 12 hours.  Patient is on Plavix but denies hitting his head.  History of cervical spinal fusion however no tenderness or trauma to this area.  Complaining of thoracic and lumbar spinal pain.  No saddle anesthesia, bowel/bladder dysfunction, fevers, chills, IVDU or other red flags.   Fall       Home Medications Prior to Admission medications   Medication Sig Start Date End Date Taking? Authorizing Provider  aspirin 325 MG tablet Take 650 mg by mouth once.    [provider]  Baclofen 5 MG TABS Take 5 mg by mouth as needed.    [provider]  Cholecalciferol (VITAMIN D3) 125 MCG (5000 UT) CAPS Take 5,000 Units by mouth as needed.    [provider]  CITRUS BERGAMOT PO Take by mouth.    [provider]  clopidogrel (PLAVIX) 75 MG tablet TAKE 1 TABLET BY MOUTH EVERY DAY 10/28/21   Croitoru, Mihai, MD  Coenzyme Q10-Vitamin E (QUNOL ULTRA COQ10) 100-150 MG-UNIT CAPS Take by mouth.    [provider]  cyclobenzaprine (FLEXERIL) 5 MG tablet Take 5 mg by mouth 3 (three) times daily as needed. 11/09/21   [provider]  famotidine (PEPCID) 10 MG tablet Take 10 mg by mouth 2 (two) times daily as needed.    [provider]  Feverfew 380 MG CAPS Take 380 mg by mouth as needed.    [provider]  finasteride (PROSCAR) 5 MG  tablet Take 5 mg by mouth daily. 01/19/18   [provider]  FLOVENT HFA 110 MCG/ACT inhaler Inhale 1 puff into the lungs 3 (three) times daily.  04/13/14   [provider]  labetalol (NORMODYNE) 300 MG tablet TAKE 1 TABLET BY MOUTH 2 TIMES DAILY. 05/08/21   Croitoru, Mihai, MD  lactobacillus acidophilus (BACID) TABS tablet Take 2 tablets by mouth 2 (two) times daily. 04/20/18   Briant Cedar, MD  mupirocin ointment (BACTROBAN) 2 % Apply 1 application  topically as needed. 05/26/19   [provider]  NIFEdipine (ADALAT CC) 60 MG 24 hr tablet Take 1 tablet (60 mg total) by mouth every evening. 04/29/22   Croitoru, Mihai, MD  pantoprazole (PROTONIX) 40 MG tablet 40 mg as needed (heartburn). 11/30/19   [provider]  rosuvastatin (CRESTOR) 20 MG tablet TOME UNA TABLETA TODOS LOS DIAS Patient taking differently: Take 20 mg by mouth daily. 01/28/21   Croitoru, Mihai, MD  senna-docusate (SENOKOT-S) 8.6-50 MG tablet Take 3 tablets by mouth every evening. 04/20/18   Briant Cedar, MD  spironolactone (ALDACTONE) 100 MG tablet Take 1 tablet (100 mg total) by mouth daily. 04/29/22   Croitoru, Mihai, MD  tamsulosin (FLOMAX) 0.4 MG CAPS capsule Take 1 capsule (0.4 mg total) by mouth daily. 01/09/17   Massie Maroon, FNP  tiZANidine (ZANAFLEX) 4 MG tablet  TAKE 2 TABLETS BY MOUTH AT BEDTIME AS NEEDED 03/16/19   [provider]      Allergies    Elavil [amitriptyline hcl], Losartan potassium, and Sulfa antibiotics    Review of Systems   Review of Systems  Physical Exam Updated Vital Signs BP 137/78 (BP Location: Left Arm)   Pulse 88   Temp 97.6 F (36.4 C) (Oral)   Resp 18   Ht  (1.702 m)   Wt 59.9 kg   SpO2 97%   BMI 20.67 kg/m  Physical Exam Vitals and nursing note reviewed.  Constitutional:      General: He is not in acute distress.    Appearance: Normal appearance. He is not ill-appearing.  HENT:     Head: Normocephalic and  atraumatic.  Eyes:     General: No scleral icterus.    Conjunctiva/sclera: Conjunctivae normal.  Pulmonary:     Effort: Pulmonary effort is normal. No respiratory distress.  Musculoskeletal:     Comments: Tenderness to lower thoracic and lumbar spine tenderness.  Full range of motion intact.  No step-offs or crepitus  Full range of motion of bilateral lower extremities.  Normal strength to knee extension and flexion.  No deformities.  1+ DP bilat.   Skin:    Findings: No rash.  Neurological:     Mental Status: He is alert.  Psychiatric:        Mood and Affect: Mood normal.     ED Results / Procedures / Treatments   Labs (all labs ordered are listed, but only abnormal results are displayed) Labs Reviewed  CK - Abnormal; Notable for the following components:      Result Value   Total CK 547 (*)    All other components within normal limits  BASIC METABOLIC PANEL - Abnormal; Notable for the following components:   Sodium 129 (*)    Chloride 96 (*)    CO2 21 (*)    All other components within normal limits  CBC WITH DIFFERENTIAL/PLATELET  HEPATIC FUNCTION PANEL  URINALYSIS, ROUTINE W REFLEX MICROSCOPIC    EKG None  Radiology DG Thoracic Spine 2 View  Result Date: 11/04/2022 CLINICAL DATA:  Back pain EXAM: THORACIC SPINE 2 VIEWS COMPARISON:  None Available. FINDINGS: Mild dextroscoliosis is seen. No recent fracture is seen. Degenerative changes are noted with small anterior bony spurs. Degenerative changes are noted in visualized lower cervical spine. Paraspinal soft tissues are unremarkable. Possible coronary artery calcifications and stent are noted. IMPRESSION: No recent fracture is seen. Coronary artery disease. Electronically Signed   By: Ernie Avena M.D.   On: 11/04/2022 14:49   DG Lumbar Spine Complete  Result Date: 11/04/2022 CLINICAL DATA:  Back pain EXAM: LUMBAR SPINE - COMPLETE 4+ VIEW COMPARISON:  None Available. FINDINGS: No recent fracture is seen. There  is mild dextroscoliosis. Degenerative changes are noted with bony spurs and facet hypertrophy, more so in the lower lumbar spine. Arterial calcifications are seen in aorta and its major branches. There is previous left hip arthroplasty. IMPRESSION: No recent fracture is seen. Lumbar spondylosis with bony spurs and facet hypertrophy. Arteriosclerosis. Electronically Signed   By: Ernie Avena M.D.   On: 11/04/2022 14:47    Procedures Procedures   Medications Ordered in ED Medications  sodium chloride 0.9 % bolus 1,000 mL (1,000 mLs Intravenous New Bag/Given 11/04/22 1503)    ED Course/ Medical Decision Making/ A&P Clinical Course as of 11/04/22 1854  Tue Nov 04, 2022  1606 Selena Batten  in lab to add LFTs to labwork [MR]  1646 Rhea in lab to add LFTs [MR]  1853 Nursing staff has tried to contact the lab twice about the hepatic function panel.  Still no results [MR]    Clinical Course User Index [MR] Babatunde Seago, Gabriel Cirri, PA-C                             Medical Decision Making Amount and/or Complexity of Data Reviewed Labs: ordered. Radiology: ordered.   77 year old male presenting today after fall. Differential for the fall includes but is not limited to arrhythmia, seizure, syncope, CVA, hypoglycemia, orthostasis, vasovagal, intoxication.    This is not an exhaustive differential.    Past Medical History / Co-morbidities / Social History: Hypertension, hyperlipidemia, CVA and PAD   Additional history: Per chart review patient has a history of hemiplegia from a prior stroke, spinal stenosis, lumbar and thoracic spinal radiculopathy and multiple other spinal conditions   Physical Exam: Pertinent physical exam findings include Normal strength bilateral lower extremities.  Alert and oriented.  Lung sounds clear.  No lacerations, abrasions or deformities  Lab Tests: I ordered, and personally interpreted labs.  The pertinent results include: Sodium 129, chloride 96 CK 547, not  elevated enough to consider rhabdomyolysis   Imaging Studies: Thoracic and lumbar spinal imaging negative for acute findings.  Considered CT however patient had very low velocity trauma and does not have severe tenderness.    Medications: Fluid bolus for hyponatremia, declined pain medications  MDM/Disposition: This is a 77 year old male presenting today after fall.  He reports he was trying to transfer from his wheelchair to his bed when he slipped and fell onto the ground.  Subsequently sat on the ground for 12 hours because he was unable to get up.  CK ordered for potential rhabdo due to long time on the ground.  Level 547.  Low suspicion rhabdo in this situation however urinalysis and hepatic function panel was pulled.  Unfortunately, after several calls to the lab his LFTs are still not back.  His urine sample was benign.  Patient is mentating appropriately.  His daughters are at bedside and stated that he is acting normally and that they would prefer to take him home without the results.  They do report that he has had several visits with his PCP and has not had the requested outpatient MRI scheduled.  We discussed that this is not indicated based on my normal exam today however I would refer them to a specialist who may be could get him in for an outpatient MRI.  They were thankful for this.  Patient is well-appearing, ambulatory throughout the department, was treated for his hyponatremia and will be discharged at this time    Final Clinical Impression(s) / ED Diagnoses Final diagnoses:  Fall, initial encounter    Rx / DC Orders ED Discharge Orders     None      Results and diagnoses were explained to the patient. Return precautions discussed in full. Patient had no additional questions and expressed complete understanding.   This chart was dictated using voice recognition software.  Despite best efforts to proofread,  errors can occur which can change the documentation meaning.     Woodroe Chen 11/04/22 1854    Jacalyn Lefevre, MD 11/06/22 [phone]

## 2022-11-04 NOTE — Discharge Instructions (Signed)
You came to the emergency department today after a fall.  You laid on the ground for a long time so we wanted to make sure that you are not in a dangerous condition called rhabdomyolysis.  Your urine sample was normal which is reassuring.  Your x-rays do not show any concerning findings either.  I have placed a referral to neurosurgery.  They should call you within the next 3 days and if they do not please call the spine specialist office attached to these discharge papers.  Both of the [place] offices are good options if you would like a new primary care provider.  Please do not hesitate to return to the emergency department with any worsening symptoms.  Especially more falls, numbness or tingling to the pelvic area, urinary or fecal incontinence or any other concerning symptoms.  It was a pleasure to meet you and we hope you feel better.  You may follow-up on his final lab in your MyChart.

## 2022-11-05 DIAGNOSIS — I2510 Atherosclerotic heart disease of native coronary artery without angina pectoris: Secondary | ICD-10-CM | POA: Diagnosis not present

## 2022-11-05 DIAGNOSIS — N1831 Chronic kidney disease, stage 3a: Secondary | ICD-10-CM | POA: Diagnosis not present

## 2022-11-05 DIAGNOSIS — I739 Peripheral vascular disease, unspecified: Secondary | ICD-10-CM | POA: Diagnosis not present

## 2022-11-05 DIAGNOSIS — G8929 Other chronic pain: Secondary | ICD-10-CM | POA: Diagnosis not present

## 2022-11-05 DIAGNOSIS — I69354 Hemiplegia and hemiparesis following cerebral infarction affecting left non-dominant side: Secondary | ICD-10-CM | POA: Diagnosis not present

## 2022-11-05 DIAGNOSIS — J4520 Mild intermittent asthma, uncomplicated: Secondary | ICD-10-CM | POA: Diagnosis not present

## 2022-11-05 DIAGNOSIS — I714 Abdominal aortic aneurysm, without rupture, unspecified: Secondary | ICD-10-CM | POA: Diagnosis not present

## 2022-11-05 DIAGNOSIS — I129 Hypertensive chronic kidney disease with stage 1 through stage 4 chronic kidney disease, or unspecified chronic kidney disease: Secondary | ICD-10-CM | POA: Diagnosis not present

## 2022-11-05 DIAGNOSIS — M7582 Other shoulder lesions, left shoulder: Secondary | ICD-10-CM | POA: Diagnosis not present

## 2022-11-06 DIAGNOSIS — G8929 Other chronic pain: Secondary | ICD-10-CM | POA: Diagnosis not present

## 2022-11-06 DIAGNOSIS — J4520 Mild intermittent asthma, uncomplicated: Secondary | ICD-10-CM | POA: Diagnosis not present

## 2022-11-06 DIAGNOSIS — I129 Hypertensive chronic kidney disease with stage 1 through stage 4 chronic kidney disease, or unspecified chronic kidney disease: Secondary | ICD-10-CM | POA: Diagnosis not present

## 2022-11-06 DIAGNOSIS — I2510 Atherosclerotic heart disease of native coronary artery without angina pectoris: Secondary | ICD-10-CM | POA: Diagnosis not present

## 2022-11-06 DIAGNOSIS — I69354 Hemiplegia and hemiparesis following cerebral infarction affecting left non-dominant side: Secondary | ICD-10-CM | POA: Diagnosis not present

## 2022-11-06 DIAGNOSIS — M7582 Other shoulder lesions, left shoulder: Secondary | ICD-10-CM | POA: Diagnosis not present

## 2022-11-06 DIAGNOSIS — N1831 Chronic kidney disease, stage 3a: Secondary | ICD-10-CM | POA: Diagnosis not present

## 2022-11-06 DIAGNOSIS — I714 Abdominal aortic aneurysm, without rupture, unspecified: Secondary | ICD-10-CM | POA: Diagnosis not present

## 2022-11-06 DIAGNOSIS — I739 Peripheral vascular disease, unspecified: Secondary | ICD-10-CM | POA: Diagnosis not present

## 2022-11-10 DIAGNOSIS — R634 Abnormal weight loss: Secondary | ICD-10-CM | POA: Diagnosis not present

## 2022-11-10 DIAGNOSIS — E2609 Other primary hyperaldosteronism: Secondary | ICD-10-CM | POA: Diagnosis not present

## 2022-11-10 DIAGNOSIS — E871 Hypo-osmolality and hyponatremia: Secondary | ICD-10-CM | POA: Diagnosis not present

## 2022-11-10 DIAGNOSIS — I10 Essential (primary) hypertension: Secondary | ICD-10-CM | POA: Diagnosis not present

## 2022-11-10 DIAGNOSIS — L819 Disorder of pigmentation, unspecified: Secondary | ICD-10-CM | POA: Diagnosis not present

## 2022-11-10 DIAGNOSIS — E279 Disorder of adrenal gland, unspecified: Secondary | ICD-10-CM | POA: Diagnosis not present

## 2022-11-12 DIAGNOSIS — G8929 Other chronic pain: Secondary | ICD-10-CM | POA: Diagnosis not present

## 2022-11-12 DIAGNOSIS — J4520 Mild intermittent asthma, uncomplicated: Secondary | ICD-10-CM | POA: Diagnosis not present

## 2022-11-12 DIAGNOSIS — I2510 Atherosclerotic heart disease of native coronary artery without angina pectoris: Secondary | ICD-10-CM | POA: Diagnosis not present

## 2022-11-12 DIAGNOSIS — I129 Hypertensive chronic kidney disease with stage 1 through stage 4 chronic kidney disease, or unspecified chronic kidney disease: Secondary | ICD-10-CM | POA: Diagnosis not present

## 2022-11-12 DIAGNOSIS — N1831 Chronic kidney disease, stage 3a: Secondary | ICD-10-CM | POA: Diagnosis not present

## 2022-11-12 DIAGNOSIS — M7582 Other shoulder lesions, left shoulder: Secondary | ICD-10-CM | POA: Diagnosis not present

## 2022-11-12 DIAGNOSIS — I739 Peripheral vascular disease, unspecified: Secondary | ICD-10-CM | POA: Diagnosis not present

## 2022-11-12 DIAGNOSIS — I69354 Hemiplegia and hemiparesis following cerebral infarction affecting left non-dominant side: Secondary | ICD-10-CM | POA: Diagnosis not present

## 2022-11-12 DIAGNOSIS — I714 Abdominal aortic aneurysm, without rupture, unspecified: Secondary | ICD-10-CM | POA: Diagnosis not present

## 2022-11-18 DIAGNOSIS — I714 Abdominal aortic aneurysm, without rupture, unspecified: Secondary | ICD-10-CM | POA: Diagnosis not present

## 2022-11-18 DIAGNOSIS — J4520 Mild intermittent asthma, uncomplicated: Secondary | ICD-10-CM | POA: Diagnosis not present

## 2022-11-18 DIAGNOSIS — N1831 Chronic kidney disease, stage 3a: Secondary | ICD-10-CM | POA: Diagnosis not present

## 2022-11-18 DIAGNOSIS — I129 Hypertensive chronic kidney disease with stage 1 through stage 4 chronic kidney disease, or unspecified chronic kidney disease: Secondary | ICD-10-CM | POA: Diagnosis not present

## 2022-11-18 DIAGNOSIS — I69354 Hemiplegia and hemiparesis following cerebral infarction affecting left non-dominant side: Secondary | ICD-10-CM | POA: Diagnosis not present

## 2022-11-18 DIAGNOSIS — G8929 Other chronic pain: Secondary | ICD-10-CM | POA: Diagnosis not present

## 2022-11-18 DIAGNOSIS — I2510 Atherosclerotic heart disease of native coronary artery without angina pectoris: Secondary | ICD-10-CM | POA: Diagnosis not present

## 2022-11-18 DIAGNOSIS — I739 Peripheral vascular disease, unspecified: Secondary | ICD-10-CM | POA: Diagnosis not present

## 2022-11-18 DIAGNOSIS — M7582 Other shoulder lesions, left shoulder: Secondary | ICD-10-CM | POA: Diagnosis not present

## 2022-11-20 DIAGNOSIS — M7582 Other shoulder lesions, left shoulder: Secondary | ICD-10-CM | POA: Diagnosis not present

## 2022-11-20 DIAGNOSIS — J4520 Mild intermittent asthma, uncomplicated: Secondary | ICD-10-CM | POA: Diagnosis not present

## 2022-11-20 DIAGNOSIS — I714 Abdominal aortic aneurysm, without rupture, unspecified: Secondary | ICD-10-CM | POA: Diagnosis not present

## 2022-11-20 DIAGNOSIS — I739 Peripheral vascular disease, unspecified: Secondary | ICD-10-CM | POA: Diagnosis not present

## 2022-11-20 DIAGNOSIS — G8929 Other chronic pain: Secondary | ICD-10-CM | POA: Diagnosis not present

## 2022-11-20 DIAGNOSIS — I2510 Atherosclerotic heart disease of native coronary artery without angina pectoris: Secondary | ICD-10-CM | POA: Diagnosis not present

## 2022-11-20 DIAGNOSIS — I69354 Hemiplegia and hemiparesis following cerebral infarction affecting left non-dominant side: Secondary | ICD-10-CM | POA: Diagnosis not present

## 2022-11-20 DIAGNOSIS — N1831 Chronic kidney disease, stage 3a: Secondary | ICD-10-CM | POA: Diagnosis not present

## 2022-11-20 DIAGNOSIS — I129 Hypertensive chronic kidney disease with stage 1 through stage 4 chronic kidney disease, or unspecified chronic kidney disease: Secondary | ICD-10-CM | POA: Diagnosis not present

## 2022-11-21 DIAGNOSIS — I129 Hypertensive chronic kidney disease with stage 1 through stage 4 chronic kidney disease, or unspecified chronic kidney disease: Secondary | ICD-10-CM | POA: Diagnosis not present

## 2022-11-21 DIAGNOSIS — I2510 Atherosclerotic heart disease of native coronary artery without angina pectoris: Secondary | ICD-10-CM | POA: Diagnosis not present

## 2022-11-21 DIAGNOSIS — M7582 Other shoulder lesions, left shoulder: Secondary | ICD-10-CM | POA: Diagnosis not present

## 2022-11-21 DIAGNOSIS — I739 Peripheral vascular disease, unspecified: Secondary | ICD-10-CM | POA: Diagnosis not present

## 2022-11-21 DIAGNOSIS — N1831 Chronic kidney disease, stage 3a: Secondary | ICD-10-CM | POA: Diagnosis not present

## 2022-11-21 DIAGNOSIS — J4520 Mild intermittent asthma, uncomplicated: Secondary | ICD-10-CM | POA: Diagnosis not present

## 2022-11-21 DIAGNOSIS — I714 Abdominal aortic aneurysm, without rupture, unspecified: Secondary | ICD-10-CM | POA: Diagnosis not present

## 2022-11-21 DIAGNOSIS — I69354 Hemiplegia and hemiparesis following cerebral infarction affecting left non-dominant side: Secondary | ICD-10-CM | POA: Diagnosis not present

## 2022-11-21 DIAGNOSIS — G8929 Other chronic pain: Secondary | ICD-10-CM | POA: Diagnosis not present

## 2022-11-28 DIAGNOSIS — I69354 Hemiplegia and hemiparesis following cerebral infarction affecting left non-dominant side: Secondary | ICD-10-CM | POA: Diagnosis not present

## 2022-11-28 DIAGNOSIS — I129 Hypertensive chronic kidney disease with stage 1 through stage 4 chronic kidney disease, or unspecified chronic kidney disease: Secondary | ICD-10-CM | POA: Diagnosis not present

## 2022-11-28 DIAGNOSIS — J4520 Mild intermittent asthma, uncomplicated: Secondary | ICD-10-CM | POA: Diagnosis not present

## 2022-11-28 DIAGNOSIS — G8929 Other chronic pain: Secondary | ICD-10-CM | POA: Diagnosis not present

## 2022-11-28 DIAGNOSIS — I714 Abdominal aortic aneurysm, without rupture, unspecified: Secondary | ICD-10-CM | POA: Diagnosis not present

## 2022-11-28 DIAGNOSIS — M7582 Other shoulder lesions, left shoulder: Secondary | ICD-10-CM | POA: Diagnosis not present

## 2022-11-28 DIAGNOSIS — I739 Peripheral vascular disease, unspecified: Secondary | ICD-10-CM | POA: Diagnosis not present

## 2022-11-28 DIAGNOSIS — I2510 Atherosclerotic heart disease of native coronary artery without angina pectoris: Secondary | ICD-10-CM | POA: Diagnosis not present

## 2022-11-28 DIAGNOSIS — N1831 Chronic kidney disease, stage 3a: Secondary | ICD-10-CM | POA: Diagnosis not present

## 2022-12-02 DIAGNOSIS — J4520 Mild intermittent asthma, uncomplicated: Secondary | ICD-10-CM | POA: Diagnosis not present

## 2022-12-02 DIAGNOSIS — I714 Abdominal aortic aneurysm, without rupture, unspecified: Secondary | ICD-10-CM | POA: Diagnosis not present

## 2022-12-02 DIAGNOSIS — I69354 Hemiplegia and hemiparesis following cerebral infarction affecting left non-dominant side: Secondary | ICD-10-CM | POA: Diagnosis not present

## 2022-12-02 DIAGNOSIS — I739 Peripheral vascular disease, unspecified: Secondary | ICD-10-CM | POA: Diagnosis not present

## 2022-12-02 DIAGNOSIS — I129 Hypertensive chronic kidney disease with stage 1 through stage 4 chronic kidney disease, or unspecified chronic kidney disease: Secondary | ICD-10-CM | POA: Diagnosis not present

## 2022-12-02 DIAGNOSIS — N1831 Chronic kidney disease, stage 3a: Secondary | ICD-10-CM | POA: Diagnosis not present

## 2022-12-02 DIAGNOSIS — I2510 Atherosclerotic heart disease of native coronary artery without angina pectoris: Secondary | ICD-10-CM | POA: Diagnosis not present

## 2022-12-02 DIAGNOSIS — M7582 Other shoulder lesions, left shoulder: Secondary | ICD-10-CM | POA: Diagnosis not present

## 2022-12-02 DIAGNOSIS — G8929 Other chronic pain: Secondary | ICD-10-CM | POA: Diagnosis not present

## 2022-12-03 ENCOUNTER — Emergency Department (HOSPITAL_COMMUNITY)
Admission: EM | Admit: 2022-12-03 | Discharge: 2022-12-03 | Disposition: A | Discharge status: Home / Self Care | Payer: Medicare HMO | Attending: Emergency Medicine | Admitting: Emergency Medicine

## 2022-12-03 ENCOUNTER — Encounter (HOSPITAL_COMMUNITY): Payer: Self-pay

## 2022-12-03 ENCOUNTER — Emergency Department (HOSPITAL_COMMUNITY): Payer: Medicare HMO

## 2022-12-03 ENCOUNTER — Other Ambulatory Visit: Payer: Self-pay

## 2022-12-03 DIAGNOSIS — I10 Essential (primary) hypertension: Secondary | ICD-10-CM | POA: Insufficient documentation

## 2022-12-03 DIAGNOSIS — R350 Frequency of micturition: Secondary | ICD-10-CM | POA: Insufficient documentation

## 2022-12-03 DIAGNOSIS — Z7982 Long term (current) use of aspirin: Secondary | ICD-10-CM | POA: Diagnosis not present

## 2022-12-03 DIAGNOSIS — Z1152 Encounter for screening for COVID-19: Secondary | ICD-10-CM | POA: Insufficient documentation

## 2022-12-03 DIAGNOSIS — R3915 Urgency of urination: Secondary | ICD-10-CM | POA: Diagnosis not present

## 2022-12-03 DIAGNOSIS — Z79899 Other long term (current) drug therapy: Secondary | ICD-10-CM | POA: Insufficient documentation

## 2022-12-03 DIAGNOSIS — Z7951 Long term (current) use of inhaled steroids: Secondary | ICD-10-CM | POA: Insufficient documentation

## 2022-12-03 DIAGNOSIS — J45909 Unspecified asthma, uncomplicated: Secondary | ICD-10-CM | POA: Insufficient documentation

## 2022-12-03 DIAGNOSIS — R6883 Chills (without fever): Secondary | ICD-10-CM | POA: Diagnosis not present

## 2022-12-03 DIAGNOSIS — Z8673 Personal history of transient ischemic attack (TIA), and cerebral infarction without residual deficits: Secondary | ICD-10-CM | POA: Insufficient documentation

## 2022-12-03 DIAGNOSIS — E860 Dehydration: Secondary | ICD-10-CM | POA: Diagnosis not present

## 2022-12-03 DIAGNOSIS — Z7902 Long term (current) use of antithrombotics/antiplatelets: Secondary | ICD-10-CM | POA: Diagnosis not present

## 2022-12-03 LAB — BASIC METABOLIC PANEL
Anion gap: 9 (ref 5–15)
BUN: 18 mg/dL (ref 8–23)
CO2: 23 mmol/L (ref 22–32)
Calcium: 8.7 mg/dL — ABNORMAL LOW (ref 8.9–10.3)
Chloride: 95 mmol/L — ABNORMAL LOW (ref 98–111)
Creatinine, Ser: 0.81 mg/dL (ref 0.61–1.24)
GFR, Estimated: >60 mL/min (ref >60)
Glucose, Bld: 122 mg/dL — ABNORMAL HIGH (ref 70–99)
Potassium: 4.2 mmol/L (ref 3.5–5.1)
Sodium: 127 mmol/L — ABNORMAL LOW (ref 135–145)

## 2022-12-03 LAB — CBC WITH DIFFERENTIAL/PLATELET
Abs Immature Granulocytes: 0.07 10*3/uL (ref 0.00–0.07)
Basophils Absolute: 0 10*3/uL (ref 0.0–0.1)
Basophils Relative: 0 %
Eosinophils Absolute: 0.1 10*3/uL (ref 0.0–0.5)
Eosinophils Relative: 1 %
HCT: 37 % — ABNORMAL LOW (ref 39.0–52.0)
Hemoglobin: 12.2 g/dL — ABNORMAL LOW (ref 13.0–17.0)
Immature Granulocytes: 1 %
Lymphocytes Relative: 20 %
Lymphs Abs: 1.3 10*3/uL (ref 0.7–4.0)
MCH: 31.7 pg (ref 26.0–34.0)
MCHC: 33 g/dL (ref 30.0–36.0)
MCV: 96.1 fL (ref 80.0–100.0)
Monocytes Absolute: 1.2 10*3/uL — ABNORMAL HIGH (ref 0.1–1.0)
Monocytes Relative: 19 %
Neutro Abs: 3.7 10*3/uL (ref 1.7–7.7)
Neutrophils Relative %: 59 %
Platelets: 259 10*3/uL (ref 150–400)
RBC: 3.85 MIL/uL — ABNORMAL LOW (ref 4.22–5.81)
RDW: 13.1 % (ref 11.5–15.5)
WBC: 6.3 10*3/uL (ref 4.0–10.5)
nRBC: 0 % (ref 0.0–0.2)

## 2022-12-03 LAB — URINALYSIS, ROUTINE W REFLEX MICROSCOPIC
Bilirubin Urine: NEGATIVE
Glucose, UA: NEGATIVE mg/dL
Hgb urine dipstick: NEGATIVE
Ketones, ur: NEGATIVE mg/dL
Leukocytes,Ua: NEGATIVE
Nitrite: NEGATIVE
Protein, ur: NEGATIVE mg/dL
Specific Gravity, Urine: 1.004 — ABNORMAL LOW (ref 1.005–1.030)
pH: 7 (ref 5.0–8.0)

## 2022-12-03 LAB — SARS CORONAVIRUS 2 BY RT PCR: SARS Coronavirus 2 by RT PCR: NEGATIVE

## 2022-12-03 MED ORDER — SODIUM CHLORIDE 0.9 % IV BOLUS
1000.0000 mL | Freq: Once | INTRAVENOUS | Status: AC
  Administered 2022-12-03: 1000 mL via INTRAVENOUS

## 2022-12-03 NOTE — ED Provider Notes (Signed)
[place] EMERGENCY DEPARTMENT AT [facility] Provider Note   CSN: [id number] Arrival date & time: 12/03/22  1415     History  Chief Complaint  Patient presents with   Urinary Frequency    Howard Jackson is a 77 y.o. male with a past medical history of BPH, stroke, chronic back pain, and sciatic nerve pain who presents today for urinary hesitancy, frequency, and chills. Patient reports that he developed chills 2 days ago which he believes is due to a home health nurse being sick earlier this week. He has not had chill since then.  He denies fever, nausea, or vomiting. No abdominal pain, flank pain, hematuria, dark tarry stools, or bright red blood per rectum.  Patient told his daughters that he had one episode of chills and they called EMS today. No other complaints or concerns at this time.      Home Medications Prior to Admission medications   Medication Sig Start Date End Date Taking? Authorizing Provider  aspirin 325 MG tablet Take 650 mg by mouth once.    [provider]  Baclofen 5 MG TABS Take 5 mg by mouth as needed.    [provider]  Cholecalciferol (VITAMIN D3) 125 MCG (5000 UT) CAPS Take 5,000 Units by mouth as needed.    [provider]  CITRUS BERGAMOT PO Take by mouth.    [provider]  clopidogrel (PLAVIX) 75 MG tablet TAKE 1 TABLET BY MOUTH EVERY DAY 10/28/21   Croitoru, Mihai, MD  Coenzyme Q10-Vitamin E (QUNOL ULTRA COQ10) 100-150 MG-UNIT CAPS Take by mouth.    [provider]  cyclobenzaprine (FLEXERIL) 5 MG tablet Take 5 mg by mouth 3 (three) times daily as needed. 11/09/21   [provider]  famotidine (PEPCID) 10 MG tablet Take 10 mg by mouth 2 (two) times daily as needed.    [provider]  Feverfew 380 MG CAPS Take 380 mg by mouth as needed.    [provider]  finasteride (PROSCAR) 5 MG tablet Take 5 mg by mouth daily. 01/19/18   [provider]  FLOVENT HFA 110  MCG/ACT inhaler Inhale 1 puff into the lungs 3 (three) times daily.  04/13/14   [provider]  labetalol (NORMODYNE) 300 MG tablet TAKE 1 TABLET BY MOUTH 2 TIMES DAILY. 05/08/21   Croitoru, Mihai, MD  lactobacillus acidophilus (BACID) TABS tablet Take 2 tablets by mouth 2 (two) times daily. 04/20/18   Briant Cedar, MD  mupirocin ointment (BACTROBAN) 2 % Apply 1 application  topically as needed. 05/26/19   [provider]  NIFEdipine (ADALAT CC) 60 MG 24 hr tablet Take 1 tablet (60 mg total) by mouth every evening. 04/29/22   Croitoru, Mihai, MD  pantoprazole (PROTONIX) 40 MG tablet 40 mg as needed (heartburn). 11/30/19   [provider]  rosuvastatin (CRESTOR) 20 MG tablet TOME UNA TABLETA TODOS LOS DIAS Patient taking differently: Take 20 mg by mouth daily. 01/28/21   Croitoru, Mihai, MD  senna-docusate (SENOKOT-S) 8.6-50 MG tablet Take 3 tablets by mouth every evening. 04/20/18   Briant Cedar, MD  spironolactone (ALDACTONE) 100 MG tablet Take 1 tablet (100 mg total) by mouth daily. 04/29/22   Croitoru, Mihai, MD  tamsulosin (FLOMAX) 0.4 MG CAPS capsule Take 1 capsule (0.4 mg total) by mouth daily. 01/09/17   Massie Maroon, FNP  tiZANidine (ZANAFLEX) 4 MG tablet TAKE 2 TABLETS BY MOUTH AT BEDTIME AS NEEDED 03/16/19   [provider]      Allergies    Amitriptyline hcl, Losartan potassium, Misc. sulfonamide containing compounds, and Sulfa antibiotics    Review of Systems   Review of Systems  Genitourinary:  Positive for frequency.  All other systems reviewed and are negative.   Physical Exam Updated Vital Signs BP 135/79   Pulse 71   Temp 98.5 F (36.9 C) (Oral)   Resp 18   Wt 59 kg   SpO2 99%   BMI 20.37 kg/m  Physical Exam Vitals and nursing note reviewed.  Constitutional:      Appearance: Normal appearance.  HENT:     Head: Normocephalic and atraumatic.     Mouth/Throat:     Mouth: Mucous membranes are moist.  Eyes:      Conjunctiva/sclera: Conjunctivae normal.     Pupils: Pupils are equal, round, and reactive to light.  Cardiovascular:     Rate and Rhythm: Normal rate and regular rhythm.     Pulses: Normal pulses.  Pulmonary:     Effort: Pulmonary effort is normal.     Breath sounds: Normal breath sounds.  Abdominal:     Palpations: Abdomen is soft.     Tenderness: There is no abdominal tenderness. There is no right CVA tenderness or left CVA tenderness.  Skin:    General: Skin is warm and dry.     Findings: No rash.  Neurological:     General: No focal deficit present.     Mental Status: He is alert.  Psychiatric:        Mood and Affect: Mood normal.        Behavior: Behavior normal.     ED Results / Procedures / Treatments   Labs (all labs ordered are listed, but only abnormal results are displayed) Labs Reviewed  URINALYSIS, ROUTINE W REFLEX MICROSCOPIC - Abnormal; Notable for the following components:      Result Value   Color, Urine STRAW (*)    Specific Gravity, Urine 1.004 (*)    All other components within normal limits  BASIC METABOLIC PANEL - Abnormal; Notable for the following components:   Sodium 127 (*)    Chloride 95 (*)    Glucose, Bld 122 (*)    Calcium 8.7 (*)    All other components within normal limits  CBC WITH DIFFERENTIAL/PLATELET - Abnormal; Notable for the following components:   RBC 3.85 (*)    Hemoglobin 12.2 (*)    HCT 37.0 (*)    Monocytes Absolute 1.2 (*)    All other components within normal limits  SARS CORONAVIRUS 2 BY RT PCR  POC OCCULT BLOOD, ED    EKG None  Radiology DG Chest Portable 1 View  Result Date: 12/03/2022 CLINICAL DATA:  Urinary hesitancy and frequency EXAM: PORTABLE CHEST 1 VIEW COMPARISON:  09/18/2022 FINDINGS: Single frontal view of the chest demonstrates an unremarkable cardiac silhouette. No airspace disease, effusion, or pneumothorax. Severe left shoulder osteoarthritis. No acute bony abnormalities. IMPRESSION: 1. No acute  intrathoracic process. Electronically Signed   By: Sharlet Salina M.D.   On: 12/03/2022 17:43    Procedures Procedures: Not indicated.   Medications Ordered in ED Medications  sodium chloride 0.9 % bolus 1,000 mL (0 mLs Intravenous Stopped 12/03/22 1904)    ED Course/ Medical Decision Making/ A&P                             Medical Decision Making Amount and/or  Complexity of Data Reviewed Labs: ordered. Radiology: ordered.   This patient presents to the ED for concern of urinary frequency and chills, this involves an extensive number of treatment options, and is a complaint that carries with it a high risk of complications and morbidity.  The differential diagnosis includes pyelonephritis, cystitis, COVID, viral infection.   Co morbidities that complicate the patient evaluation  BPH History of stroke PAD Hypertension Dyslipidemia Asthma Chronic back pain   Additional history obtained:  Additional history obtained from patient. I tried to call his daughter listed in the chart but the phone rang and call did not go through.   Lab Tests:  I Ordered, and personally interpreted labs - within normal limits. Negative for COVID. Patient's hemoglobin dropped two points since last CBC 1 month ago. He denies any abdominal pain, dark/tarry stools, or bright red blood in stool so FOBT was not done today. Instructed patient to follow up with primary care or endocrinology (whoever patient prefers and can see first) in 1 month for repeat CBC.  Imaging Tests: Bladder scan: initial volume - 465 mL, post-void volume - 142 Patient was able to void about 650 mL in the urinal  Problem List / ED Course / Critical interventions / Medication management  Chills no fever, dysuria No medications given in the ED I have reviewed the patients home medicines and have made adjustments as needed   Social Determinants of Health:  Housing   Test / Admission - Considered:  Patient is  hemodynamically stable and safe for discharge home. Return instructions given to patient.         Final Clinical Impression(s) / ED Diagnoses Final diagnoses:  Chills (without fever)    Rx / DC Orders ED Discharge Orders     None         Maxwell Marion, PA-C 12/03/22 1919    Charlynne Pander, MD 12/03/22 2329

## 2022-12-03 NOTE — ED Notes (Signed)
Pt scanned at bedside with 465 noted in bladder.

## 2022-12-03 NOTE — ED Notes (Signed)
Discharged by day shift nurse but was not discharged from system

## 2022-12-03 NOTE — Discharge Instructions (Addendum)
Discussed results with patient. Follow up with PCP in 1 month to repeat CBC.   Advised patient to add zinc, elderberry, or vitamin C supplement into diet to strengthen immune system.  Return to ED if: you are unable to urinate, you develop dark or tarry stools, shortness of breath, or chest pain.

## 2022-12-03 NOTE — ED Triage Notes (Signed)
BIBA from home with c/o urinary hesitancy and frequency

## 2022-12-04 DIAGNOSIS — I129 Hypertensive chronic kidney disease with stage 1 through stage 4 chronic kidney disease, or unspecified chronic kidney disease: Secondary | ICD-10-CM | POA: Diagnosis not present

## 2022-12-04 DIAGNOSIS — J4520 Mild intermittent asthma, uncomplicated: Secondary | ICD-10-CM | POA: Diagnosis not present

## 2022-12-04 DIAGNOSIS — G8929 Other chronic pain: Secondary | ICD-10-CM | POA: Diagnosis not present

## 2022-12-04 DIAGNOSIS — I714 Abdominal aortic aneurysm, without rupture, unspecified: Secondary | ICD-10-CM | POA: Diagnosis not present

## 2022-12-04 DIAGNOSIS — I2510 Atherosclerotic heart disease of native coronary artery without angina pectoris: Secondary | ICD-10-CM | POA: Diagnosis not present

## 2022-12-04 DIAGNOSIS — N1831 Chronic kidney disease, stage 3a: Secondary | ICD-10-CM | POA: Diagnosis not present

## 2022-12-04 DIAGNOSIS — I739 Peripheral vascular disease, unspecified: Secondary | ICD-10-CM | POA: Diagnosis not present

## 2022-12-04 DIAGNOSIS — M7582 Other shoulder lesions, left shoulder: Secondary | ICD-10-CM | POA: Diagnosis not present

## 2022-12-04 DIAGNOSIS — I69354 Hemiplegia and hemiparesis following cerebral infarction affecting left non-dominant side: Secondary | ICD-10-CM | POA: Diagnosis not present

## 2022-12-05 DIAGNOSIS — I69354 Hemiplegia and hemiparesis following cerebral infarction affecting left non-dominant side: Secondary | ICD-10-CM | POA: Diagnosis not present

## 2022-12-05 DIAGNOSIS — N1831 Chronic kidney disease, stage 3a: Secondary | ICD-10-CM | POA: Diagnosis not present

## 2022-12-05 DIAGNOSIS — I2510 Atherosclerotic heart disease of native coronary artery without angina pectoris: Secondary | ICD-10-CM | POA: Diagnosis not present

## 2022-12-05 DIAGNOSIS — M7582 Other shoulder lesions, left shoulder: Secondary | ICD-10-CM | POA: Diagnosis not present

## 2022-12-05 DIAGNOSIS — I129 Hypertensive chronic kidney disease with stage 1 through stage 4 chronic kidney disease, or unspecified chronic kidney disease: Secondary | ICD-10-CM | POA: Diagnosis not present

## 2022-12-05 DIAGNOSIS — I739 Peripheral vascular disease, unspecified: Secondary | ICD-10-CM | POA: Diagnosis not present

## 2022-12-05 DIAGNOSIS — J4520 Mild intermittent asthma, uncomplicated: Secondary | ICD-10-CM | POA: Diagnosis not present

## 2022-12-05 DIAGNOSIS — I714 Abdominal aortic aneurysm, without rupture, unspecified: Secondary | ICD-10-CM | POA: Diagnosis not present

## 2022-12-05 DIAGNOSIS — G8929 Other chronic pain: Secondary | ICD-10-CM | POA: Diagnosis not present

## 2022-12-12 DIAGNOSIS — I739 Peripheral vascular disease, unspecified: Secondary | ICD-10-CM | POA: Diagnosis not present

## 2022-12-12 DIAGNOSIS — M7582 Other shoulder lesions, left shoulder: Secondary | ICD-10-CM | POA: Diagnosis not present

## 2022-12-12 DIAGNOSIS — I69354 Hemiplegia and hemiparesis following cerebral infarction affecting left non-dominant side: Secondary | ICD-10-CM | POA: Diagnosis not present

## 2022-12-12 DIAGNOSIS — I2510 Atherosclerotic heart disease of native coronary artery without angina pectoris: Secondary | ICD-10-CM | POA: Diagnosis not present

## 2022-12-12 DIAGNOSIS — J4520 Mild intermittent asthma, uncomplicated: Secondary | ICD-10-CM | POA: Diagnosis not present

## 2022-12-12 DIAGNOSIS — N1831 Chronic kidney disease, stage 3a: Secondary | ICD-10-CM | POA: Diagnosis not present

## 2022-12-12 DIAGNOSIS — G8929 Other chronic pain: Secondary | ICD-10-CM | POA: Diagnosis not present

## 2022-12-12 DIAGNOSIS — I129 Hypertensive chronic kidney disease with stage 1 through stage 4 chronic kidney disease, or unspecified chronic kidney disease: Secondary | ICD-10-CM | POA: Diagnosis not present

## 2022-12-12 DIAGNOSIS — I714 Abdominal aortic aneurysm, without rupture, unspecified: Secondary | ICD-10-CM | POA: Diagnosis not present

## 2022-12-17 ENCOUNTER — Observation Stay (HOSPITAL_COMMUNITY)
Admission: EM | Admit: 2022-12-17 | Discharge: 2022-12-19 | Disposition: A | Discharge status: Home / Self Care | Payer: Medicare HMO | Attending: Family Medicine | Admitting: Family Medicine

## 2022-12-17 ENCOUNTER — Encounter (HOSPITAL_COMMUNITY): Payer: Self-pay

## 2022-12-17 ENCOUNTER — Emergency Department (HOSPITAL_COMMUNITY): Payer: Medicare HMO

## 2022-12-17 ENCOUNTER — Other Ambulatory Visit: Payer: Self-pay

## 2022-12-17 DIAGNOSIS — I6930 Unspecified sequelae of cerebral infarction: Secondary | ICD-10-CM

## 2022-12-17 DIAGNOSIS — R519 Headache, unspecified: Secondary | ICD-10-CM | POA: Diagnosis not present

## 2022-12-17 DIAGNOSIS — Z8673 Personal history of transient ischemic attack (TIA), and cerebral infarction without residual deficits: Secondary | ICD-10-CM | POA: Diagnosis not present

## 2022-12-17 DIAGNOSIS — G4489 Other headache syndrome: Secondary | ICD-10-CM | POA: Diagnosis not present

## 2022-12-17 DIAGNOSIS — Z79899 Other long term (current) drug therapy: Secondary | ICD-10-CM | POA: Diagnosis not present

## 2022-12-17 DIAGNOSIS — E440 Moderate protein-calorie malnutrition: Secondary | ICD-10-CM | POA: Diagnosis not present

## 2022-12-17 DIAGNOSIS — E7800 Pure hypercholesterolemia, unspecified: Secondary | ICD-10-CM | POA: Diagnosis not present

## 2022-12-17 DIAGNOSIS — R2689 Other abnormalities of gait and mobility: Secondary | ICD-10-CM | POA: Diagnosis not present

## 2022-12-17 DIAGNOSIS — K047 Periapical abscess without sinus: Secondary | ICD-10-CM | POA: Insufficient documentation

## 2022-12-17 DIAGNOSIS — N401 Enlarged prostate with lower urinary tract symptoms: Secondary | ICD-10-CM

## 2022-12-17 DIAGNOSIS — I110 Hypertensive heart disease with heart failure: Secondary | ICD-10-CM | POA: Diagnosis not present

## 2022-12-17 DIAGNOSIS — E871 Hypo-osmolality and hyponatremia: Secondary | ICD-10-CM | POA: Insufficient documentation

## 2022-12-17 DIAGNOSIS — Z1152 Encounter for screening for COVID-19: Secondary | ICD-10-CM | POA: Insufficient documentation

## 2022-12-17 DIAGNOSIS — Z8679 Personal history of other diseases of the circulatory system: Secondary | ICD-10-CM | POA: Diagnosis not present

## 2022-12-17 DIAGNOSIS — I2510 Atherosclerotic heart disease of native coronary artery without angina pectoris: Secondary | ICD-10-CM | POA: Insufficient documentation

## 2022-12-17 DIAGNOSIS — I10 Essential (primary) hypertension: Secondary | ICD-10-CM | POA: Diagnosis present

## 2022-12-17 DIAGNOSIS — Z7982 Long term (current) use of aspirin: Secondary | ICD-10-CM | POA: Insufficient documentation

## 2022-12-17 DIAGNOSIS — E785 Hyperlipidemia, unspecified: Secondary | ICD-10-CM | POA: Diagnosis present

## 2022-12-17 DIAGNOSIS — I6381 Other cerebral infarction due to occlusion or stenosis of small artery: Secondary | ICD-10-CM | POA: Diagnosis not present

## 2022-12-17 DIAGNOSIS — I5032 Chronic diastolic (congestive) heart failure: Secondary | ICD-10-CM | POA: Diagnosis not present

## 2022-12-17 DIAGNOSIS — I739 Peripheral vascular disease, unspecified: Secondary | ICD-10-CM | POA: Diagnosis not present

## 2022-12-17 DIAGNOSIS — N400 Enlarged prostate without lower urinary tract symptoms: Secondary | ICD-10-CM | POA: Diagnosis present

## 2022-12-17 DIAGNOSIS — R351 Nocturia: Secondary | ICD-10-CM | POA: Diagnosis not present

## 2022-12-17 DIAGNOSIS — M316 Other giant cell arteritis: Secondary | ICD-10-CM | POA: Diagnosis not present

## 2022-12-17 LAB — CBC WITH DIFFERENTIAL/PLATELET
Abs Immature Granulocytes: 0.12 10*3/uL — ABNORMAL HIGH (ref 0.00–0.07)
Basophils Absolute: 0 10*3/uL (ref 0.0–0.1)
Basophils Relative: 1 %
Eosinophils Absolute: 0.1 10*3/uL (ref 0.0–0.5)
Eosinophils Relative: 1 %
HCT: 36.7 % — ABNORMAL LOW (ref 39.0–52.0)
Hemoglobin: 12.1 g/dL — ABNORMAL LOW (ref 13.0–17.0)
Immature Granulocytes: 2 %
Lymphocytes Relative: 15 %
Lymphs Abs: 1.2 10*3/uL (ref 0.7–4.0)
MCH: 31.3 pg (ref 26.0–34.0)
MCHC: 33 g/dL (ref 30.0–36.0)
MCV: 94.8 fL (ref 80.0–100.0)
Monocytes Absolute: 1 10*3/uL (ref 0.1–1.0)
Monocytes Relative: 13 %
Neutro Abs: 5.5 10*3/uL (ref 1.7–7.7)
Neutrophils Relative %: 68 %
Platelets: 388 10*3/uL (ref 150–400)
RBC: 3.87 MIL/uL — ABNORMAL LOW (ref 4.22–5.81)
RDW: 12.6 % (ref 11.5–15.5)
WBC: 8 10*3/uL (ref 4.0–10.5)
nRBC: 0 % (ref 0.0–0.2)

## 2022-12-17 LAB — RETICULOCYTES
Immature Retic Fract: 14.7 % (ref 2.3–15.9)
RBC.: 3.93 MIL/uL — ABNORMAL LOW (ref 4.22–5.81)
Retic Count, Absolute: 70 10*3/uL (ref 19.0–186.0)
Retic Ct Pct: 1.8 % (ref 0.4–3.1)

## 2022-12-17 LAB — HEPATIC FUNCTION PANEL
ALT: 82 U/L — ABNORMAL HIGH (ref 0–44)
AST: 39 U/L (ref 15–41)
Albumin: 3 g/dL — ABNORMAL LOW (ref 3.5–5.0)
Alkaline Phosphatase: 86 U/L (ref 38–126)
Bilirubin, Direct: <0.1 mg/dL (ref 0.0–0.2)
Total Bilirubin: 0.5 mg/dL (ref 0.3–1.2)
Total Protein: 7.1 g/dL (ref 6.5–8.1)

## 2022-12-17 LAB — MAGNESIUM: Magnesium: 1.7 mg/dL (ref 1.7–2.4)

## 2022-12-17 LAB — FERRITIN: Ferritin: 670 ng/mL — ABNORMAL HIGH (ref 24–336)

## 2022-12-17 LAB — URINALYSIS, COMPLETE (UACMP) WITH MICROSCOPIC
Bacteria, UA: NONE SEEN
Bilirubin Urine: NEGATIVE
Glucose, UA: NEGATIVE mg/dL
Hgb urine dipstick: NEGATIVE
Ketones, ur: NEGATIVE mg/dL
Leukocytes,Ua: NEGATIVE
Nitrite: NEGATIVE
Protein, ur: NEGATIVE mg/dL
Specific Gravity, Urine: 1.009 (ref 1.005–1.030)
pH: 6 (ref 5.0–8.0)

## 2022-12-17 LAB — BASIC METABOLIC PANEL
Anion gap: 12 (ref 5–15)
BUN: 24 mg/dL — ABNORMAL HIGH (ref 8–23)
CO2: 22 mmol/L (ref 22–32)
Calcium: 8.9 mg/dL (ref 8.9–10.3)
Chloride: 94 mmol/L — ABNORMAL LOW (ref 98–111)
Creatinine, Ser: 0.98 mg/dL (ref 0.61–1.24)
GFR, Estimated: >60 mL/min (ref >60)
Glucose, Bld: 107 mg/dL — ABNORMAL HIGH (ref 70–99)
Potassium: 4.2 mmol/L (ref 3.5–5.1)
Sodium: 128 mmol/L — ABNORMAL LOW (ref 135–145)

## 2022-12-17 LAB — FOLATE: Folate: 8.4 ng/mL (ref >5.9)

## 2022-12-17 LAB — CREATININE, URINE, RANDOM: Creatinine, Urine: 38 mg/dL

## 2022-12-17 LAB — PHOSPHORUS: Phosphorus: 3 mg/dL (ref 2.5–4.6)

## 2022-12-17 LAB — CK: Total CK: 95 U/L (ref 49–397)

## 2022-12-17 LAB — IRON AND TIBC
Iron: 14 ug/dL — ABNORMAL LOW (ref 45–182)
Saturation Ratios: 8 % — ABNORMAL LOW (ref 17.9–39.5)
TIBC: 185 ug/dL — ABNORMAL LOW (ref 250–450)
UIBC: 171 ug/dL

## 2022-12-17 LAB — C-REACTIVE PROTEIN: CRP: 15.6 mg/dL — ABNORMAL HIGH (ref <1.0)

## 2022-12-17 LAB — SEDIMENTATION RATE: Sed Rate: 81 mm/hr — ABNORMAL HIGH (ref 0–16)

## 2022-12-17 LAB — SODIUM, URINE, RANDOM: Sodium, Ur: 102 mmol/L

## 2022-12-17 LAB — SARS CORONAVIRUS 2 BY RT PCR: SARS Coronavirus 2 by RT PCR: NEGATIVE

## 2022-12-17 LAB — VITAMIN B12: Vitamin B-12: 519 pg/mL (ref 180–914)

## 2022-12-17 MED ORDER — SPIRONOLACTONE 25 MG PO TABS
100.0000 mg | ORAL_TABLET | Freq: Every day | ORAL | Status: DC
  Administered 2022-12-18 – 2022-12-19 (×2): 100 mg via ORAL
  Filled 2022-12-17 (×2): qty 4

## 2022-12-17 MED ORDER — TAMSULOSIN HCL 0.4 MG PO CAPS
0.4000 mg | ORAL_CAPSULE | Freq: Every day | ORAL | Status: DC
  Administered 2022-12-18 – 2022-12-19 (×2): 0.4 mg via ORAL
  Filled 2022-12-17 (×2): qty 1

## 2022-12-17 MED ORDER — HYDROCODONE-ACETAMINOPHEN 5-325 MG PO TABS
1.0000 | ORAL_TABLET | ORAL | Status: DC | PRN
  Administered 2022-12-17: 2 via ORAL
  Filled 2022-12-17: qty 1
  Filled 2022-12-17: qty 2
  Filled 2022-12-17: qty 1

## 2022-12-17 MED ORDER — FINASTERIDE 5 MG PO TABS
5.0000 mg | ORAL_TABLET | Freq: Every day | ORAL | Status: DC
  Administered 2022-12-18 – 2022-12-19 (×2): 5 mg via ORAL
  Filled 2022-12-17 (×2): qty 1

## 2022-12-17 MED ORDER — ROSUVASTATIN CALCIUM 20 MG PO TABS
20.0000 mg | ORAL_TABLET | Freq: Every day | ORAL | Status: DC
  Administered 2022-12-18 – 2022-12-19 (×2): 20 mg via ORAL
  Filled 2022-12-17 (×2): qty 1

## 2022-12-17 MED ORDER — FAMOTIDINE 20 MG PO TABS: 10.0000 mg | ORAL_TABLET | Freq: Two times a day (BID) | ORAL | Status: DC | PRN

## 2022-12-17 MED ORDER — DIPHENHYDRAMINE HCL 50 MG/ML IJ SOLN
25.0000 mg | Freq: Once | INTRAMUSCULAR | Status: AC
  Administered 2022-12-17: 25 mg via INTRAVENOUS
  Filled 2022-12-17: qty 1

## 2022-12-17 MED ORDER — ONDANSETRON HCL 4 MG PO TABS: 4.0000 mg | ORAL_TABLET | Freq: Four times a day (QID) | ORAL | Status: DC | PRN

## 2022-12-17 MED ORDER — SODIUM CHLORIDE 0.9 % IV SOLN: INTRAVENOUS | Status: AC

## 2022-12-17 MED ORDER — AMOXICILLIN-POT CLAVULANATE 875-125 MG PO TABS
1.0000 | ORAL_TABLET | Freq: Two times a day (BID) | ORAL | Status: DC
  Administered 2022-12-17 – 2022-12-19 (×5): 1 via ORAL
  Filled 2022-12-17 (×5): qty 1

## 2022-12-17 MED ORDER — SENNA 8.6 MG PO TABS
1.0000 | ORAL_TABLET | Freq: Two times a day (BID) | ORAL | Status: DC
  Administered 2022-12-17 – 2022-12-19 (×3): 8.6 mg via ORAL
  Filled 2022-12-17 (×4): qty 1

## 2022-12-17 MED ORDER — PREDNISONE 20 MG PO TABS
60.0000 mg | ORAL_TABLET | Freq: Once | ORAL | Status: AC
  Administered 2022-12-17: 60 mg via ORAL
  Filled 2022-12-17: qty 3

## 2022-12-17 MED ORDER — ACETAMINOPHEN 325 MG PO TABS
650.0000 mg | ORAL_TABLET | Freq: Four times a day (QID) | ORAL | Status: DC | PRN
  Administered 2022-12-19: 650 mg via ORAL
  Filled 2022-12-17: qty 2

## 2022-12-17 MED ORDER — ONDANSETRON HCL 4 MG/2ML IJ SOLN: 4.0000 mg | Freq: Four times a day (QID) | INTRAMUSCULAR | Status: DC | PRN

## 2022-12-17 MED ORDER — POLYETHYLENE GLYCOL 3350 17 G PO PACK: 17.0000 g | PACK | Freq: Every day | ORAL | Status: DC | PRN

## 2022-12-17 MED ORDER — ACETAMINOPHEN 650 MG RE SUPP: 650.0000 mg | Freq: Four times a day (QID) | RECTAL | Status: DC | PRN

## 2022-12-17 MED ORDER — LABETALOL HCL 200 MG PO TABS
300.0000 mg | ORAL_TABLET | Freq: Two times a day (BID) | ORAL | Status: DC
  Administered 2022-12-17 – 2022-12-19 (×4): 300 mg via ORAL
  Filled 2022-12-17 (×4): qty 2

## 2022-12-17 MED ORDER — PANTOPRAZOLE SODIUM 40 MG PO TBEC: 40.0000 mg | DELAYED_RELEASE_TABLET | ORAL | Status: DC | PRN

## 2022-12-17 MED ORDER — PREDNISONE 50 MG PO TABS
60.0000 mg | ORAL_TABLET | Freq: Every day | ORAL | Status: DC
  Administered 2022-12-18 – 2022-12-19 (×2): 60 mg via ORAL
  Filled 2022-12-17 (×2): qty 1

## 2022-12-17 MED ORDER — METOCLOPRAMIDE HCL 5 MG/ML IJ SOLN
10.0000 mg | Freq: Once | INTRAMUSCULAR | Status: AC
  Administered 2022-12-17: 10 mg via INTRAVENOUS
  Filled 2022-12-17: qty 2

## 2022-12-17 NOTE — ED Notes (Signed)
ED TO INPATIENT HANDOFF REPORT  Name/Age/Gender Howard Jackson 77 y.o. male  Code Status    Code Status Orders  (From admission, onward)           Start     Ordered   12/17/22 2144  Full code  Continuous       Question:  By:  Answer:  Consent: discussion documented in EHR   12/17/22 2145           Code Status History     Date Active Date Inactive Code Status Order ID Comments User Context   04/14/2018 2352 04/20/2018 1732 Full Code [id number]  Lorretta Harp, MD ED   11/18/2012 0957 11/19/2012 1445 Full Code [id number]  Shelly Rubenstein, MD Inpatient   05/25/2011 1102 05/25/2011 1434 Full Code [id number]  Blanchie Dessert, RN ED       Home/SNF/Other Home  Chief Complaint Temporal arteritis [facility]) [M31.6] Acute diastolic CHF (congestive heart failure) (HCC) [I50.31]  Level of Care/Admitting Diagnosis ED Disposition     ED Disposition  Admit   Condition  --   Comment  Hospital Area: [facility] [[id number]]  Level of Care: Telemetry [5]  Admit to tele based on following criteria: Acute CHF  May place patient in observation at [facility] or [facility] if equivalent level of care is available:: No  Covid Evaluation: Asymptomatic - no recent exposure (last 10 days) testing not required  Diagnosis: Acute diastolic CHF (congestive heart failure) [facility]) [[id number]]  Admitting Physician: Therisa Doyne [[id number]]  Attending Physician: Therisa Doyne [[id number]]          Medical History Past Medical History:  Diagnosis Date   Adrenal gland disorder (HCC)    tumor present - no change-  no recent increase     Arthritis    Benign pheochromocytoma of right adrenal gland 06/23/2014   Never had histological diagnosis, but reportedly initially diagnosed in 1989   Bunion    left foot   Complication of anesthesia    "died on the table during cervical fusion" '89 - believed to be d/t "too much medication" to treat HTN and was later dx with pheochromocytoma    Coronary artery disease    cleared for surg. by Dr. Allyson Sabal - [facility]   H/O hiatal hernia    Hammer toe    Herniated lumbar intervertebral disc    History of anxiety    Hyperlipidemia    Hypertension    Neurogenic bladder    Neuromuscular disorder (HCC)    carpal tunnel problem, even after surgery release    Peripheral arterial disease (HCC)    Primary aldosteronism (HCC)    Stroke (HCC)    in [place]- 1999, L sided weakness, wears a brace on L leg & uses cane    Allergies Allergies  Allergen Reactions   Amitriptyline Hcl Other (See Comments)    No description of reactions noted, Other reaction(s): Hypertension   Losartan Potassium     Other reaction(s): hypotension   Misc. Sulfonamide Containing Compounds    Sulfa Antibiotics Other (See Comments)    Constipation   Other reaction(s): Other (See Comments)  Constipation    IV Location/Drains/Wounds Patient Lines/Drains/Airways Status     Active Line/Drains/Airways     Name Placement date Placement time Site Days   Peripheral IV 12/17/22 20 G Right Antecubital 12/17/22  1640  Antecubital  less than 1            Labs/Imaging Results for orders  placed or performed during the hospital encounter of 12/17/22 (from the past 48 hour(s))  Sedimentation rate     Status: Abnormal   Collection Time: 12/17/22  5:35 PM  Result Value Ref Range   Sed Rate 81 (H) 0 - 16 mm/hr    Comment: Performed at [facility], [address], [place], [place] [address]  CBC with Differential     Status: Abnormal   Collection Time: 12/17/22  5:35 PM  Result Value Ref Range   WBC 8.0 4.0 - 10.5 K/uL   RBC 3.87 (L) 4.22 - 5.81 MIL/uL   Hemoglobin 12.1 (L) 13.0 - 17.0 g/dL   HCT 60.4 (L) 54.0 - 98.1 %   MCV 94.8 80.0 - 100.0 fL   MCH 31.3 26.0 - 34.0 pg   MCHC 33.0 30.0 - 36.0 g/dL   RDW 19.1 47.8 - 29.5 %   Platelets 388 150 - 400 K/uL   nRBC 0.0 0.0 - 0.2 %   Neutrophils Relative % 68 %   Neutro Abs 5.5 1.7 - 7.7 K/uL    Lymphocytes Relative 15 %   Lymphs Abs 1.2 0.7 - 4.0 K/uL   Monocytes Relative 13 %   Monocytes Absolute 1.0 0.1 - 1.0 K/uL   Eosinophils Relative 1 %   Eosinophils Absolute 0.1 0.0 - 0.5 K/uL   Basophils Relative 1 %   Basophils Absolute 0.0 0.0 - 0.1 K/uL   Immature Granulocytes 2 %   Abs Immature Granulocytes 0.12 (H) 0.00 - 0.07 K/uL    Comment: Performed at [facility], [address], [place], [place] [address]  Basic metabolic panel     Status: Abnormal   Collection Time: 12/17/22  5:35 PM  Result Value Ref Range   Sodium 128 (L) 135 - 145 mmol/L   Potassium 4.2 3.5 - 5.1 mmol/L   Chloride 94 (L) 98 - 111 mmol/L   CO2 22 22 - 32 mmol/L   Glucose, Bld 107 (H) 70 - 99 mg/dL    Comment: Glucose reference range applies only to samples taken after fasting for at least 8 hours.   BUN 24 (H) 8 - 23 mg/dL   Creatinine, Ser 8.65 0.61 - 1.24 mg/dL   Calcium 8.9 8.9 - 78.4 mg/dL   GFR, Estimated >69 >62 mL/min    Comment: (NOTE) Calculated using the CKD-EPI Creatinine Equation (2021)    Anion gap 12 5 - 15    Comment: Performed at [facility], [address], [place], [place] [address]  Hepatic function panel     Status: Abnormal   Collection Time: 12/17/22  5:35 PM  Result Value Ref Range   Total Protein 7.1 6.5 - 8.1 g/dL   Albumin 3.0 (L) 3.5 - 5.0 g/dL   AST 39 15 - 41 U/L   ALT 82 (H) 0 - 44 U/L   Alkaline Phosphatase 86 38 - 126 U/L   Total Bilirubin 0.5 0.3 - 1.2 mg/dL   Bilirubin, Direct <1.3 0.0 - 0.2 mg/dL   Indirect Bilirubin NOT CALCULATED 0.3 - 0.9 mg/dL    Comment: Performed at [facility], [address], [place], [place] [address]  CK     Status: None   Collection Time: 12/17/22  5:35 PM  Result Value Ref Range   Total CK 95 49 - 397 U/L    Comment: Performed at [facility], [address], [place], [place] [address]  Magnesium  Status: None   Collection Time: 12/17/22  5:35  PM  Result Value Ref Range   Magnesium 1.7 1.7 - 2.4 mg/dL    Comment: Performed at [facility], [address], [place], [place] [address]  Phosphorus     Status: None   Collection Time: 12/17/22  5:35 PM  Result Value Ref Range   Phosphorus 3.0 2.5 - 4.6 mg/dL    Comment: Performed at [facility], [address], [place], [place] [address]  Reticulocytes     Status: Abnormal   Collection Time: 12/17/22  5:35 PM  Result Value Ref Range   Retic Ct Pct 1.8 0.4 - 3.1 %   RBC. 3.93 (L) 4.22 - 5.81 MIL/uL   Retic Count, Absolute 70.0 19.0 - 186.0 K/uL   Immature Retic Fract 14.7 2.3 - 15.9 %    Comment: Performed at [facility], [address] [place], [place] [address]  Creatinine, urine, random     Status: None   Collection Time: 12/17/22  8:50 PM  Result Value Ref Range   Creatinine, Urine 38 mg/dL    Comment: Performed at [facility], [address] [place], [place] [address]  Urinalysis, Complete w Microscopic -Urine, Clean Catch     Status: None   Collection Time: 12/17/22  8:50 PM  Result Value Ref Range   Color, Urine YELLOW YELLOW   APPearance CLEAR CLEAR   Specific Gravity, Urine 1.009 1.005 - 1.030   pH 6.0 5.0 - 8.0   Glucose, UA NEGATIVE NEGATIVE mg/dL   Hgb urine dipstick NEGATIVE NEGATIVE   Bilirubin Urine NEGATIVE NEGATIVE   Ketones, ur NEGATIVE NEGATIVE mg/dL   Protein, ur NEGATIVE NEGATIVE mg/dL   Nitrite NEGATIVE NEGATIVE   Leukocytes,Ua NEGATIVE NEGATIVE   RBC / HPF 0-5 0 - 5 RBC/hpf   WBC, UA 0-5 0 - 5 WBC/hpf   Bacteria, UA NONE SEEN NONE SEEN   Squamous Epithelial / HPF 0-5 0 - 5 /HPF   Mucus PRESENT     Comment: Performed at [facility], [address], [place], [place] [address]  Sodium, urine, random     Status: None   Collection Time: 12/17/22  8:50 PM  Result Value Ref Range   Sodium, Ur 102 mmol/L    Comment: Performed at [facility], [address], [place], [place] [address]  SARS Coronavirus 2 by RT PCR (hospital order, performed in [facility] hospital lab) *cepheid single result test* Anterior Nasal Swab     Status: None   Collection Time: 12/17/22  9:08 PM   Specimen: Anterior Nasal Swab  Result Value Ref Range   SARS Coronavirus 2 by RT PCR NEGATIVE NEGATIVE    Comment: (NOTE) SARS-CoV-2 target nucleic acids are NOT DETECTED.  The SARS-CoV-2 RNA is generally detectable in upper and lower respiratory specimens during the acute phase of infection. The lowest concentration of SARS-CoV-2 viral copies this assay can detect is 250 copies / mL. A negative result does not preclude SARS-CoV-2 infection and should not be used as the sole basis for treatment or other patient management decisions.  A negative result may occur with improper specimen collection / handling, submission of specimen other than nasopharyngeal swab, presence of viral mutation(s) within the areas targeted by this assay, and inadequate number of viral copies (<250 copies / mL). A negative result must be combined with clinical observations, patient history, and epidemiological information.  Fact Sheet  for Patients:   RoadLapTop.co.za  Fact Sheet for Healthcare Providers: http://kim-miller.com/  This test is not yet approved or  cleared by the [place] FDA and has been authorized for detection and/or diagnosis of SARS-CoV-2 by FDA under an Emergency Use Authorization (EUA).  This EUA will remain in effect (meaning this test can be used) for the duration of the COVID-19 declaration under Section 564(b)(1) of the Act, 21 U.S.C. section 360bbb-3(b)(1), unless the authorization is terminated or revoked sooner.  Performed at [facility], [address] [place], [place] [address]    DG Chest 2 View  Result Date: 12/17/2022 CLINICAL DATA:  Hyponatremia EXAM: CHEST - 2  VIEW COMPARISON:  Chest x-ray 12/03/2022 FINDINGS: The heart size and mediastinal contours are within normal limits. Both lungs are clear. The visualized skeletal structures are unremarkable. IMPRESSION: No active cardiopulmonary disease. Electronically Signed   By: Darliss Cheney M.D.   On: 12/17/2022 21:37   CT Head Wo Contrast  Result Date: 12/17/2022 CLINICAL DATA:  New onset headaches.  Headaches for 3 days. EXAM: CT HEAD WITHOUT CONTRAST TECHNIQUE: Contiguous axial images were obtained from the base of the skull through the vertex without intravenous contrast. RADIATION DOSE REDUCTION: This exam was performed according to the departmental dose-optimization program which includes automated exposure control, adjustment of the mA and/or kV according to patient size and/or use of iterative reconstruction technique. COMPARISON:  01/30/2019 FINDINGS: Brain: Old lacunar infarct in the right basal ganglia. Old area of encephalomalacia in the right posterior frontal lobe, unchanged since prior study, consistent with old infarct in the distribution of the right middle cerebral artery. Diffuse cerebral atrophy. Ventricular dilatation consistent with central atrophy. Low-attenuation changes in the deep white matter consistent with small vessel ischemia. No abnormal extra-axial fluid collections. No mass effect or midline shift. Basal cisterns are not effaced. No acute intracranial hemorrhage. Vascular: No hyperdense vessel or unexpected calcification. Skull: Normal. Negative for fracture or focal lesion. Sinuses/Orbits: No acute finding. Other: None. IMPRESSION: 1. Old right middle cerebral artery infarct and old right basal ganglia lacunar infarct, unchanged. 2. Chronic atrophy and small vessel ischemic changes. 3. No acute intracranial abnormalities. Electronically Signed   By: Burman Nieves M.D.   On: 12/17/2022 18:12    Pending Labs Unresulted Labs (From admission, onward)     Start     Ordered   12/18/22  0500  Prealbumin  Tomorrow morning,   R        12/17/22 2045   12/17/22 2115  Osmolality  Once,   AD        12/17/22 2115   12/17/22 2045  Osmolality, urine  Once,   URGENT        12/17/22 2045   12/17/22 2045  TSH  Add-on,   AD        12/17/22 2045   12/17/22 2045  Vitamin B12  (Anemia Panel (PNL))  Once,   URGENT        12/17/22 2045   12/17/22 2045  Folate  (Anemia Panel (PNL))  Once,   URGENT        12/17/22 2045   12/17/22 2045  Iron and TIBC  (Anemia Panel (PNL))  Once,   URGENT        12/17/22 2045   12/17/22 2045  Ferritin  (Anemia Panel (PNL))  Once,   URGENT        12/17/22 2045   12/17/22 1708  C-reactive protein  Once,   URGENT  12/17/22 1708   Signed and Held  Magnesium  Tomorrow morning,   R        Signed and Held   Signed and Held  Phosphorus  Tomorrow morning,   R        Signed and Held   Signed and Held  Comprehensive metabolic panel  Tomorrow morning,   R       Question:  Release to patient  Answer:  Immediate   Signed and Held   Signed and Held  CBC  Tomorrow morning,   R       Question:  Release to patient  Answer:  Immediate   Signed and Held            Vitals/Pain Today's Vitals   12/17/22 1955 12/17/22 2000 12/17/22 2015 12/17/22 2050  BP:    139/66  Pulse: 86  84 86  Resp:    17  Temp:  98.2 F (36.8 C)    TempSrc:      SpO2: 100%  100% 99%    Isolation Precautions Airborne and Contact precautions  Medications Medications  metoCLOPramide (REGLAN) injection 10 mg (10 mg Intravenous Given 12/17/22 1830)  diphenhydrAMINE (BENADRYL) injection 25 mg (25 mg Intravenous Given 12/17/22 1829)  predniSONE (DELTASONE) tablet 60 mg (60 mg Oral Given 12/17/22 2011)    Mobility non-ambulatory

## 2022-12-17 NOTE — Assessment & Plan Note (Signed)
Appreciate neurology consult discussed case with Dr. Amada Jupiter will add to the list to see in AM.  Continue prednisone 60 mg daily may need biopsy to be done while inpatient In this case would benefit from vascular consult in a.m.

## 2022-12-17 NOTE — ED Provider Notes (Signed)
[place] [facility] AT [facility] Provider Note   CSN: [id number] Arrival date & time: 12/17/22  1630     History  Chief Complaint  Patient presents with   Headache    RT side frontal lobe to neck pain    Erion Gaunce is a 77 y.o. male with a past medical history significant for hypertension, previous CVA with left-sided deficits, hyperlipidemia, neuromuscular disorder, primary aldosteronism, and PAD who presents to the ED due to right-sided headache x 3 days.  Headache located in the temporal region and behind right ear.  No visual changes.  Denies speech changes.  No rash.  Also admits to some pain inside of ear. No recent head injury. Admits to chronic neck pain, no change from baseline. On Plavix and ASA. No history of headaches.   History obtained from patient and past medical records. No interpreter used during encounter.       Home Medications Prior to Admission medications   Medication Sig Start Date End Date Taking? Authorizing Provider  aspirin 325 MG tablet Take 650 mg by mouth once.    [provider]  Baclofen 5 MG TABS Take 5 mg by mouth as needed.    [provider]  Cholecalciferol (VITAMIN D3) 125 MCG (5000 UT) CAPS Take 5,000 Units by mouth as needed.    [provider]  CITRUS BERGAMOT PO Take by mouth.    [provider]  clopidogrel (PLAVIX) 75 MG tablet TAKE 1 TABLET BY MOUTH EVERY DAY 10/28/21   Croitoru, Mihai, MD  Coenzyme Q10-Vitamin E (QUNOL ULTRA COQ10) 100-150 MG-UNIT CAPS Take by mouth.    [provider]  cyclobenzaprine (FLEXERIL) 5 MG tablet Take 5 mg by mouth 3 (three) times daily as needed. 11/09/21   [provider]  famotidine (PEPCID) 10 MG tablet Take 10 mg by mouth 2 (two) times daily as needed.    [provider]  Feverfew 380 MG CAPS Take 380 mg by mouth as needed.    [provider]  finasteride (PROSCAR) 5 MG tablet Take 5 mg by mouth  daily. 01/19/18   [provider]  FLOVENT HFA 110 MCG/ACT inhaler Inhale 1 puff into the lungs 3 (three) times daily.  04/13/14   [provider]  labetalol (NORMODYNE) 300 MG tablet TAKE 1 TABLET BY MOUTH 2 TIMES DAILY. 05/08/21   Croitoru, Mihai, MD  lactobacillus acidophilus (BACID) TABS tablet Take 2 tablets by mouth 2 (two) times daily. 04/20/18   Briant Cedar, MD  mupirocin ointment (BACTROBAN) 2 % Apply 1 application  topically as needed. 05/26/19   [provider]  NIFEdipine (ADALAT CC) 60 MG 24 hr tablet Take 1 tablet (60 mg total) by mouth every evening. 04/29/22   Croitoru, Mihai, MD  pantoprazole (PROTONIX) 40 MG tablet 40 mg as needed (heartburn). 11/30/19   [provider]  rosuvastatin (CRESTOR) 20 MG tablet TOME UNA TABLETA TODOS LOS DIAS Patient taking differently: Take 20 mg by mouth daily. 01/28/21   Croitoru, Mihai, MD  senna-docusate (SENOKOT-S) 8.6-50 MG tablet Take 3 tablets by mouth every evening. 04/20/18   Briant Cedar, MD  spironolactone (ALDACTONE) 100 MG tablet Take 1 tablet (100 mg total) by mouth daily. 04/29/22   Croitoru, Mihai, MD  tamsulosin (FLOMAX) 0.4 MG CAPS capsule Take 1 capsule (0.4 mg total) by mouth daily. 01/09/17   Massie Maroon, FNP  tiZANidine (ZANAFLEX) 4 MG tablet TAKE 2 TABLETS BY MOUTH AT BEDTIME  AS NEEDED 03/16/19   [provider]      Allergies    Amitriptyline hcl, Losartan potassium, Misc. sulfonamide containing compounds, and Sulfa antibiotics    Review of Systems   Review of Systems  Constitutional:  Negative for chills and fever.  Eyes:  Negative for visual disturbance.  Neurological:  Positive for headaches. Negative for dizziness and weakness.    Physical Exam Updated Vital Signs BP (!) 142/73   Pulse 84   Temp 98.2 F (36.8 C)   Resp 18   SpO2 100%  Physical Exam Vitals and nursing note reviewed.  Constitutional:      General: He is not in acute distress.     Appearance: He is not ill-appearing.  HENT:     Head: Normocephalic.      Comments: TTP at location noted on picture above. No rash.  Eyes:     Pupils: Pupils are equal, round, and reactive to light.  Cardiovascular:     Rate and Rhythm: Normal rate and regular rhythm.     Pulses: Normal pulses.     Heart sounds: Normal heart sounds. No murmur heard.    No friction rub. No gallop.  Pulmonary:     Effort: Pulmonary effort is normal.     Breath sounds: Normal breath sounds.  Abdominal:     General: Abdomen is flat. There is no distension.     Palpations: Abdomen is soft.     Tenderness: There is no abdominal tenderness. There is no guarding or rebound.  Musculoskeletal:        General: Normal range of motion.     Cervical back: Neck supple.  Skin:    General: Skin is warm and dry.  Neurological:     General: No focal deficit present.     Mental Status: He is alert.     Comments: Left sided hemiparesis from previous CVA  Psychiatric:        Mood and Affect: Mood normal.        Behavior: Behavior normal.     ED Results / Procedures / Treatments   Labs (all labs ordered are listed, but only abnormal results are displayed) Labs Reviewed  SEDIMENTATION RATE - Abnormal; Notable for the following components:      Result Value   Sed Rate 81 (*)    All other components within normal limits  CBC WITH DIFFERENTIAL/PLATELET - Abnormal; Notable for the following components:   RBC 3.87 (*)    Hemoglobin 12.1 (*)    HCT 36.7 (*)    Abs Immature Granulocytes 0.12 (*)    All other components within normal limits  BASIC METABOLIC PANEL - Abnormal; Notable for the following components:   Sodium 128 (*)    Chloride 94 (*)    Glucose, Bld 107 (*)    BUN 24 (*)    All other components within normal limits  SARS CORONAVIRUS 2 BY RT PCR  C-REACTIVE PROTEIN  CREATININE, URINE, RANDOM  OSMOLALITY  OSMOLALITY, URINE  URINALYSIS, COMPLETE (UACMP) WITH MICROSCOPIC  SODIUM, URINE, RANDOM   HEPATIC FUNCTION PANEL  CK  MAGNESIUM  PHOSPHORUS  PREALBUMIN  TSH  VITAMIN B12  FOLATE  IRON AND TIBC  FERRITIN  RETICULOCYTES    EKG None  Radiology CT Head Wo Contrast  Result Date: 12/17/2022 CLINICAL DATA:  New onset headaches.  Headaches for 3 days. EXAM: CT HEAD WITHOUT CONTRAST TECHNIQUE: Contiguous axial images were obtained from the base of the skull through the  vertex without intravenous contrast. RADIATION DOSE REDUCTION: This exam was performed according to the departmental dose-optimization program which includes automated exposure control, adjustment of the mA and/or kV according to patient size and/or use of iterative reconstruction technique. COMPARISON:  01/30/2019 FINDINGS: Brain: Old lacunar infarct in the right basal ganglia. Old area of encephalomalacia in the right posterior frontal lobe, unchanged since prior study, consistent with old infarct in the distribution of the right middle cerebral artery. Diffuse cerebral atrophy. Ventricular dilatation consistent with central atrophy. Low-attenuation changes in the deep white matter consistent with small vessel ischemia. No abnormal extra-axial fluid collections. No mass effect or midline shift. Basal cisterns are not effaced. No acute intracranial hemorrhage. Vascular: No hyperdense vessel or unexpected calcification. Skull: Normal. Negative for fracture or focal lesion. Sinuses/Orbits: No acute finding. Other: None. IMPRESSION: 1. Old right middle cerebral artery infarct and old right basal ganglia lacunar infarct, unchanged. 2. Chronic atrophy and small vessel ischemic changes. 3. No acute intracranial abnormalities. Electronically Signed   By: Burman Nieves M.D.   On: 12/17/2022 18:12    Procedures Procedures    Medications Ordered in ED Medications  metoCLOPramide (REGLAN) injection 10 mg (10 mg Intravenous Given 12/17/22 1830)  diphenhydrAMINE (BENADRYL) injection 25 mg (25 mg Intravenous Given 12/17/22 1829)   predniSONE (DELTASONE) tablet 60 mg (60 mg Oral Given 12/17/22 2011)    ED Course/ Medical Decision Making/ A&P                             Medical Decision Making Amount and/or Complexity of Data Reviewed Independent Historian: caregiver    Details: Daughter at bedside provided some history Labs: ordered. Decision-making details documented in ED Course. Radiology: ordered and independent interpretation performed. Decision-making details documented in ED Course.  Risk Prescription drug management. Decision regarding hospitalization.   This patient presents to the ED for concern of headache, this involves an extensive number of treatment options, and is a complaint that carries with it a high risk of complications and morbidity.  The differential diagnosis includes giant cell, migraine, intracranial bleed, etc  77 year old male presents to the ED due to right-sided headache x 3 days.  No history of headaches.  Pain located in temporal region and behind right ear.  No visual changes.  Denies fever and chills.  Upon arrival, vitals all within normal limits.  Patient in no acute distress.  No rash on exam to suggest shingles. No meningismus to suggest meningitis. Left sided hemiparesis from previous stroke.Tenderness to palpation to right temporal region. ESR and CRP to rule out giant cell. CT head. Routine labs. Migraine cocktail given.    Elevated sed rate at 81.  CBC with no leukocytosis.  Mild anemia with hemoglobin 12.1.  BMP significant for hyponatremia 128.  Hyperglycemia 107.  No anion gap.  CT head personally reviewed and interpreted which demonstrates old infarcts.  No acute abnormalities.  8:54 PM discussed with Dr. Amada Jupiter with neurology who recommends starting patient on 60 mg of prednisone x 2 weeks.  Does not feel this is an indication for admission unless felt like he needed to expedite his temporal artery biopsy.  Patient needs referral to vascular surgery and neurology.  He  recommends that if he is not seen within the next 2 weeks for him to return to the ED for admission.  8:33 PM discussed with daughter and patient at bedside.  Daughter does not feel comfortable with patient going home  and has concern for quick follow-up. Will consult hospitalist for admission.  8:51 PM Discussed with Dr. Adela Glimpse with [facility] who agrees to admit patient.  Has PCP Lives at home History CVA  Discussed with Dr. Wilkie Aye who evaluated patient at bedside and agrees with assessment and plan.       Final Clinical Impression(s) / ED Diagnoses Final diagnoses:  Bad headache    Rx / DC Orders ED Discharge Orders     None         Mannie Stabile, PA-C 12/17/22 2055    Rozelle Logan, DO 12/18/22 1550

## 2022-12-17 NOTE — ED Triage Notes (Signed)
Ems reports: Patient coming from home.  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  Patient would like to have a MRI since the pain is new.  BP 130/70 Pulse 84 RR 18 O2 98 CBG 124

## 2022-12-17 NOTE — Assessment & Plan Note (Signed)
Minimally symptomatic was told to start on augmenting but did not get meds on time  Will start while in paient

## 2022-12-17 NOTE — Assessment & Plan Note (Signed)
For tonight hold aspirin and Plavix as patient will need biopsy

## 2022-12-17 NOTE — Assessment & Plan Note (Signed)
Continue Crestor to 20 mg a day

## 2022-12-17 NOTE — Assessment & Plan Note (Signed)
Resume home medications including labetalol 300 mg twice a day and spironolactone 100 mg daily for now

## 2022-12-17 NOTE — Assessment & Plan Note (Signed)
Chronic continue to follow fluid status Obtain electrolytes Checked chest x-ray and TSH

## 2022-12-17 NOTE — Assessment & Plan Note (Signed)
Chronic stable monitor fluid status

## 2022-12-17 NOTE — Assessment & Plan Note (Signed)
Resume Flomax  and Proscar

## 2022-12-17 NOTE — Assessment & Plan Note (Signed)
Hold aspirin and Plavix as patient may need biopsy

## 2022-12-17 NOTE — Assessment & Plan Note (Signed)
Hold aspirin and Plavix for tonight may need biopsy will continue labetalol and Crestor 20 mg a day

## 2022-12-17 NOTE — H&P (Signed)
Howard Jackson:[id number] DOB: Aug 28, 1945 DOA: 12/17/2022     PCP: Soundra Pilon, FNP   Outpatient Specialists:  CARDS:   Dr. Thurmon Fair, MD    Patient arrived to ER on 12/17/22 at 1630 Referred by Attending Horton, Clabe Seal, DO   Patient coming from:    home Lives alone,    Chief Complaint:   Chief Complaint  Patient presents with   Headache    RT side frontal lobe to neck pain    HPI: Howard Jackson is a 77 y.o. male with medical history significant of Stroke w Right side deficit , HLD, HTN, BPH,  neuromuscular disorder, primary aldosteronism,CAD  and PAD hx of Hyponatremia, frozen shoulder on the left    Presented with   new headache Reports headache for the past 3 days right sided headache and neck pain Headache located in the temporal region and behind right ear.  No rash , no slurred speech, no weakness no localized neurologic complaints Pt has hx of CVA already on Plavix and asa no  prior hx of headaches    He has had some tooth pain on the right but no tenderness on palpation  Denies significant ETOH intake   Does not smoke   Lab Results  Component Value Date   SARSCOV2NAA NEGATIVE 12/03/2022       Regarding pertinent Chronic problems:     Hyperlipidemia - on statins Crestor Lipid Panel     Component Value Date/Time   CHOL 156 02/03/2020 1450   TRIG 52 02/03/2020 1450   HDL 49 02/03/2020 1450   CHOLHDL 3.2 02/03/2020 1450   CHOLHDL 3.1 05/22/2016 1435   VLDL 15 05/22/2016 1435   LDLCALC 96 02/03/2020 1450   LABVLDL 11 02/03/2020 1450      HTN on labetalol hypertension related to an adrenal tumor.    CAD  - On Aspirin, statin, betablocker, Plavix                 -  followed by cardiology PAD  - subtotal right popliteal occlusion with weak monophasic PT and Per flow and occluded AT. On the left all three calf vessels are occluded, with collateral reconstitution of the PT and Per. Right ABI 0.73, left 0.77 (Doppler Sept 2014).  There was no significant pelvic or femoral disease.  - He had moderate left renal artery stenosis (50-60% angio 2011, <60% Duplex 2014), but his most recent duplex ultrasound from 2015 did not show evidence of renal artery stenosis on either side..  - Carotid US 11/2013 at [facility] showed no significant lesions. Residual right spastic hemiparesis after a remote left hemispheric stroke. -  He has had 5 coronary angioplasty/stent  procedures to the RCA and LAD, most recently in April 2011 for LAD in-stent restenosis (when he received another drug-eluting stent)        Asthma -well   controlled on home inhalers/ nebs                 Hx of CVA -  with/  residual deficits on Aspirin   325, Plavix     BPH - on Flomax, Proscar    Chronic anemia - baseline hg Hemoglobin & Hematocrit  Recent Labs    11/04/22 1254 12/03/22 1554 12/17/22 1735  HGB 14.0 12.2* 12.1*   Iron/TIBC/Ferritin/ %Sat No results found for: "IRON", "TIBC", "FERRITIN", "IRONPCTSAT"    While in ER:       Lab Orders  Sedimentation rate         C-reactive protein         CBC with Differential         Basic metabolic panel      CT HEAD   NON acute IMPRESSION: 1. Old right middle cerebral artery infarct and old right basal ganglia lacunar infarct, unchanged. 2. Chronic atrophy and small vessel ischemic changes. 3. No acute intracranial abnormalities.     CXR -  NON acute   Following Medications were ordered in ER: Medications  metoCLOPramide (REGLAN) injection 10 mg (10 mg Intravenous Given 12/17/22 1830)  diphenhydrAMINE (BENADRYL) injection 25 mg (25 mg Intravenous Given 12/17/22 1829)  predniSONE (DELTASONE) tablet 60 mg (60 mg Oral Given 12/17/22 2011)    _______________________________________________________ ER Provider Called:  Neurology    Dr. Amada Jupiter  They Recommend admit to medicine   Will see in AM    ED Triage Vitals [12/17/22 1645]  Enc Vitals Group     BP 132/72     Pulse Rate 79     Resp  16     Temp 98.1 F (36.7 C)     Temp Source Oral     SpO2 100 %     Weight      Height      Head Circumference      Peak Flow      Pain Score      Pain Loc      Pain Edu?      Excl. in GC?   ZOXW(96)@     _________________________________________ Significant initial  Findings: Abnormal Labs Reviewed  SEDIMENTATION RATE - Abnormal; Notable for the following components:      Result Value   Sed Rate 81 (*)    All other components within normal limits  CBC WITH DIFFERENTIAL/PLATELET - Abnormal; Notable for the following components:   RBC 3.87 (*)    Hemoglobin 12.1 (*)    HCT 36.7 (*)    Abs Immature Granulocytes 0.12 (*)    All other components within normal limits  BASIC METABOLIC PANEL - Abnormal; Notable for the following components:   Sodium 128 (*)    Chloride 94 (*)    Glucose, Bld 107 (*)    BUN 24 (*)    All other components within normal limits    _________________________ Troponin   Cardiac Panel (last 3 results) Recent Labs    12/17/22 1735  CKTOTAL 95     ECG: Ordered    COVID-19 Labs  No results for input(s): "DDIMER", "FERRITIN", "LDH", "CRP" in the last 72 hours.  Lab Results  Component Value Date   SARSCOV2NAA NEGATIVE 12/03/2022     WBC     Component Value Date/Time   WBC 8.0 12/17/2022 1735   LYMPHSABS 1.2 12/17/2022 1735   MONOABS 1.0 12/17/2022 1735   EOSABS 0.1 12/17/2022 1735   BASOSABS 0.0 12/17/2022 1735        UA   no evidence of UTI    Urine analysis:    Component Value Date/Time   COLORURINE YELLOW 12/17/2022 2050   APPEARANCEUR CLEAR 12/17/2022 2050   LABSPEC 1.009 12/17/2022 2050   PHURINE 6.0 12/17/2022 2050   GLUCOSEU NEGATIVE 12/17/2022 2050   HGBUR NEGATIVE 12/17/2022 2050   BILIRUBINUR NEGATIVE 12/17/2022 2050   KETONESUR NEGATIVE 12/17/2022 2050   PROTEINUR NEGATIVE 12/17/2022 2050   UROBILINOGEN 0.2 11/25/2016 1448   NITRITE NEGATIVE 12/17/2022 2050   LEUKOCYTESUR NEGATIVE 12/17/2022 2050  Results  for orders placed or performed during the hospital encounter of 12/17/22  SARS Coronavirus 2 by RT PCR (hospital order, performed in [facility] hospital lab) *cepheid single result test* Anterior Nasal Swab     Status: None   Collection Time: 12/17/22  9:08 PM   Specimen: Anterior Nasal Swab  Result Value Ref Range Status   SARS Coronavirus 2 by RT PCR NEGATIVE NEGATIVE Final            __________________________________________________________ Recent Labs  Lab 12/17/22 1735  NA 128*  K 4.2  CO2 22  GLUCOSE 107*  BUN 24*  CREATININE 0.98  CALCIUM 8.9  MG 1.7  PHOS 3.0    Cr stable,   Lab Results  Component Value Date   CREATININE 0.98 12/17/2022   CREATININE 0.81 12/03/2022   CREATININE 0.85 11/04/2022    Recent Labs  Lab 12/17/22 1735  AST 39  ALT 82*  ALKPHOS 86  BILITOT 0.5  PROT 7.1  ALBUMIN 3.0*   Lab Results  Component Value Date   CALCIUM 8.9 12/17/2022        Plt: Lab Results  Component Value Date   PLT 388 12/17/2022     Erythrocyte Sedimentation Rate     Component Value Date/Time   ESRSEDRATE 81 (H) 12/17/2022 1735       Recent Labs  Lab 12/17/22 1735  WBC 8.0  NEUTROABS 5.5  HGB 12.1*  HCT 36.7*  MCV 94.8  PLT 388    HG/HCT  stable     Component Value Date/Time   HGB 12.1 (L) 12/17/2022 1735   HGB 15.5 12/16/2019 1502   HCT 36.7 (L) 12/17/2022 1735   HCT 44.6 12/16/2019 1502   MCV 94.8 12/17/2022 1735   MCV 96 12/16/2019 1502    __________________________________ Hospitalist was called for admission for giant cell arteritis   The following Work up has been ordered so far:  Orders Placed This Encounter  Procedures   CT Head Wo Contrast   Sedimentation rate   C-reactive protein   CBC with Differential   Basic metabolic panel   Initiate Carrier Fluid Protocol   Consult to neurology   Consult to hospitalist         Cultures:    Component Value Date/Time   SDES URINE, CLEAN CATCH 10/18/2009 1011   SPECREQUEST  NONE 10/18/2009 1011   CULT INSIGNIFICANT GROWTH 10/18/2009 1011   REPTSTATUS 10/19/2009 FINAL 10/18/2009 1011     Radiological Exams on Admission: DG Chest 2 View  Result Date: 12/17/2022 CLINICAL DATA:  Hyponatremia EXAM: CHEST - 2 VIEW COMPARISON:  Chest x-ray 12/03/2022 FINDINGS: The heart size and mediastinal contours are within normal limits. Both lungs are clear. The visualized skeletal structures are unremarkable. IMPRESSION: No active cardiopulmonary disease. Electronically Signed   By: Darliss Cheney M.D.   On: 12/17/2022 21:37   CT Head Wo Contrast  Result Date: 12/17/2022 CLINICAL DATA:  New onset headaches.  Headaches for 3 days. EXAM: CT HEAD WITHOUT CONTRAST TECHNIQUE: Contiguous axial images were obtained from the base of the skull through the vertex without intravenous contrast. RADIATION DOSE REDUCTION: This exam was performed according to the departmental dose-optimization program which includes automated exposure control, adjustment of the mA and/or kV according to patient size and/or use of iterative reconstruction technique. COMPARISON:  01/30/2019 FINDINGS: Brain: Old lacunar infarct in the right basal ganglia. Old area of encephalomalacia in the right posterior frontal lobe, unchanged since prior study, consistent with old infarct  in the distribution of the right middle cerebral artery. Diffuse cerebral atrophy. Ventricular dilatation consistent with central atrophy. Low-attenuation changes in the deep white matter consistent with small vessel ischemia. No abnormal extra-axial fluid collections. No mass effect or midline shift. Basal cisterns are not effaced. No acute intracranial hemorrhage. Vascular: No hyperdense vessel or unexpected calcification. Skull: Normal. Negative for fracture or focal lesion. Sinuses/Orbits: No acute finding. Other: None. IMPRESSION: 1. Old right middle cerebral artery infarct and old right basal ganglia lacunar infarct, unchanged. 2. Chronic atrophy and  small vessel ischemic changes. 3. No acute intracranial abnormalities. Electronically Signed   By: Burman Nieves M.D.   On: 12/17/2022 18:12   _______________________________________________________________________________________________________ Latest  Blood pressure (!) 142/73, pulse 84, temperature 98.2 F (36.8 C), resp. rate 18, SpO2 100 %.   Vitals  labs and radiology finding personally reviewed  Review of Systems:    Pertinent positives include:  no fever headaches,  Constitutional:  No weight loss, night sweats, Fevers, chills, fatigue, weight loss  HEENT:  No Difficulty swallowing,Tooth/dental problems,Sore throat,  No sneezing, itching, ear ache, nasal congestion, post nasal drip,  Cardio-vascular:  No chest pain, Orthopnea, PND, anasarca, dizziness, palpitations.no Bilateral lower extremity swelling  GI:  No heartburn, indigestion, abdominal pain, nausea, vomiting, diarrhea, change in bowel habits, loss of appetite, melena, blood in stool, hematemesis Resp:  no shortness of breath at rest. No dyspnea on exertion, No excess mucus, no productive cough, No non-productive cough, No coughing up of blood.No change in color of mucus.No wheezing. Skin:  no rash or lesions. No jaundice GU:  no dysuria, change in color of urine, no urgency or frequency. No straining to urinate.  No flank pain.  Musculoskeletal:  No joint pain or no joint swelling. No decreased range of motion. No back pain.  Psych:  No change in mood or affect. No depression or anxiety. No memory loss.  Neuro: no localizing neurological complaints, no tingling, no weakness, no double vision, no gait abnormality, no slurred speech, no confusion  All systems reviewed and apart from HOPI all are negative _______________________________________________________________________________________________ Past Medical History:   Past Medical History:  Diagnosis Date   Adrenal gland disorder (HCC)    tumor present -  no change-  no recent increase     Arthritis    Benign pheochromocytoma of right adrenal gland 06/23/2014   Never had histological diagnosis, but reportedly initially diagnosed in 1989   Bunion    left foot   Complication of anesthesia    "died on the table during cervical fusion" '89 - believed to be d/t "too much medication" to treat HTN and was later dx with pheochromocytoma   Coronary artery disease    cleared for surg. by Dr. Allyson Sabal - [facility]   H/O hiatal hernia    Hammer toe    Herniated lumbar intervertebral disc    History of anxiety    Hyperlipidemia    Hypertension    Neurogenic bladder    Neuromuscular disorder (HCC)    carpal tunnel problem, even after surgery release    Peripheral arterial disease (HCC)    Primary aldosteronism (HCC)    Stroke (HCC)    in [place]- 1999, L sided weakness, wears a brace on L leg & uses cane      Past Surgical History:  Procedure Laterality Date   ANTERIOR APPROACH HEMI HIP ARTHROPLASTY Left 04/15/2018   Procedure: ANTERIOR APPROACH HEMI HIP ARTHROPLASTY;  Surgeon: Samson Frederic, MD;  Location: [facility] OR;  Service: Orthopedics;  Laterality: Left;   CARPAL TUNNEL RELEASE     bilateral    CERVICAL FUSION     CORONARY ANGIOPLASTY WITH STENT PLACEMENT     pt reports having 5 stents in his heart-2011   HERNIA REPAIR     INGUINAL HERNIA REPAIR Left 11/18/2012   Procedure: HERNIA REPAIR INGUINAL ADULT;  Surgeon: Shelly Rubenstein, MD;  Location: [facility] OR;  Service: General;  Laterality: Left;   INSERTION OF MESH Left 11/18/2012   Procedure: INSERTION OF MESH;  Surgeon: Shelly Rubenstein, MD;  Location: [facility] OR;  Service: General;  Laterality: Left;    Social History:  Ambulatory  wheelchair bound,       reports that he has never smoked. He has never used smokeless tobacco. He reports current alcohol use. He reports that he does not use drugs.    Family History:  Family History  Problem Relation Age of Onset   CVA Father    Diabetes Father     Alzheimer's disease Father    Hypertension Sister    Hypertension Brother    CVA Paternal Grandmother    Hypertension Brother    Hypertension Mother    ______________________________________________________________________________________________ Allergies: Allergies  Allergen Reactions   Amitriptyline Hcl Other (See Comments)    No description of reactions noted, Other reaction(s): Hypertension   Losartan Potassium     Other reaction(s): hypotension   Misc. Sulfonamide Containing Compounds    Sulfa Antibiotics Other (See Comments)    Constipation   Other reaction(s): Other (See Comments)  Constipation     Prior to Admission medications   Medication Sig Start Date End Date Taking? Authorizing Provider  aspirin 325 MG tablet Take 650 mg by mouth once.    [provider]  Baclofen 5 MG TABS Take 5 mg by mouth as needed.    [provider]  Cholecalciferol (VITAMIN D3) 125 MCG (5000 UT) CAPS Take 5,000 Units by mouth as needed.    [provider]  CITRUS BERGAMOT PO Take by mouth.    [provider]  clopidogrel (PLAVIX) 75 MG tablet TAKE 1 TABLET BY MOUTH EVERY DAY 10/28/21   Croitoru, Mihai, MD  Coenzyme Q10-Vitamin E (QUNOL ULTRA COQ10) 100-150 MG-UNIT CAPS Take by mouth.    [provider]  cyclobenzaprine (FLEXERIL) 5 MG tablet Take 5 mg by mouth 3 (three) times daily as needed. 11/09/21   [provider]  famotidine (PEPCID) 10 MG tablet Take 10 mg by mouth 2 (two) times daily as needed.    [provider]  Feverfew 380 MG CAPS Take 380 mg by mouth as needed.    [provider]  finasteride (PROSCAR) 5 MG tablet Take 5 mg by mouth daily. 01/19/18   [provider]  FLOVENT HFA 110 MCG/ACT inhaler Inhale 1 puff into the lungs 3 (three) times daily.  04/13/14   [provider]  labetalol (NORMODYNE) 300 MG tablet TAKE 1 TABLET BY MOUTH 2 TIMES DAILY. 05/08/21   Croitoru, Mihai, MD   lactobacillus acidophilus (BACID) TABS tablet Take 2 tablets by mouth 2 (two) times daily. 04/20/18   Briant Cedar, MD  mupirocin ointment (BACTROBAN) 2 % Apply 1 application  topically as needed. 05/26/19   [provider]  NIFEdipine (ADALAT CC) 60 MG 24 hr tablet Take 1 tablet (60 mg total) by mouth every evening. 04/29/22   Croitoru, Mihai, MD  pantoprazole (PROTONIX) 40 MG tablet 40 mg as needed (heartburn). 11/30/19  [provider]  rosuvastatin (CRESTOR) 20 MG tablet TOME UNA TABLETA TODOS LOS DIAS Patient taking differently: Take 20 mg by mouth daily. 01/28/21   Croitoru, Mihai, MD  senna-docusate (SENOKOT-S) 8.6-50 MG tablet Take 3 tablets by mouth every evening. 04/20/18   Briant Cedar, MD  spironolactone (ALDACTONE) 100 MG tablet Take 1 tablet (100 mg total) by mouth daily. 04/29/22   Croitoru, Mihai, MD  tamsulosin (FLOMAX) 0.4 MG CAPS capsule Take 1 capsule (0.4 mg total) by mouth daily. 01/09/17   Massie Maroon, FNP  tiZANidine (ZANAFLEX) 4 MG tablet TAKE 2 TABLETS BY MOUTH AT BEDTIME AS NEEDED 03/16/19   [provider]    ___________________________________________________________________________________________________ Physical Exam:    12/17/2022    8:15 PM 12/17/2022    7:55 PM 12/17/2022    7:00 PM  Vitals with BMI  Systolic   142  Diastolic   73  Pulse 84 86 84     1. General:  in No  Acute distress   Chronically ill   -appearing 2. Psychological: Alert and   Oriented 3. Head/ENT:   Dry Mucous Membranes                          Head Non traumatic, neck supple                           Poor Dentition 4. SKIN: decreased Skin turgor,  Skin clean Dry and intact no rash    5. Heart: Regular rate and rhythm no  Murmur, no Rub or gallop 6. Lungs:   no wheezes or crackles   7. Abdomen: Soft,  non-tender, Non distended bowel sounds present 8. Lower extremities: no clubbing, cyanosis, no  edema 9. Neurologically chronic left side  weakness 10. MSK: Normal range of motion    Chart has been reviewed  ______________________________________________________________________________________________  Assessment/Plan 77 y.o. male with medical history significant of Stroke w Right side deficit , HLD, HTN, BPH,  neuromuscular disorder, primary aldosteronism,CAD  and PAD hx of Hyponatremia    Admitted for suspected temporal arteritis   Present on Admission:  Temporal arteritis (HCC)  Chronic diastolic CHF (congestive heart failure) (HCC)  PAD (peripheral artery disease) (HCC)  HTN (hypertension)  BPH (benign prostatic hyperplasia)  Coronary artery disease involving native coronary artery of native heart without angina pectoris  Hypercholesterolemia  Hyponatremia  Tooth infection     Temporal arteritis (HCC) Appreciate neurology consult discussed case with Dr. Amada Jupiter will add to the list to see in AM.  Continue prednisone 60 mg daily may need biopsy to be done while inpatient In this case would benefit from vascular consult in a.m.  Chronic diastolic CHF (congestive heart failure) (HCC) Chronic stable monitor fluid status  PAD (peripheral artery disease) (HCC) Hold aspirin and Plavix as patient may need biopsy  HTN (hypertension) Resume home medications including labetalol 300 mg twice a day and spironolactone 100 mg daily for now  History of stroke For tonight hold aspirin and Plavix as patient will need biopsy  BPH (benign prostatic hyperplasia) Resume Flomax  and Proscar  Coronary artery disease involving native coronary artery of native heart without angina pectoris Hold aspirin and Plavix for tonight may need biopsy will continue labetalol and Crestor 20 mg a day  Hypercholesterolemia Continue Crestor to 20 mg a day  Hyponatremia Chronic continue to follow fluid status Obtain electrolytes Checked chest x-ray and TSH  Tooth infection Minimally symptomatic was told to start on augmenting but  did not get meds on time  Will start while in paient    Other plan as per orders.  DVT prophylaxis:  SCD     Code Status:    Code Status: Prior FULL CODE  as per patient   I had personally discussed CODE STATUS with patient     ACP none   Family Communication:   Family not at  Bedside    Diet heart healthy and NPO post midnight   Disposition Plan:        To home once workup is complete and patient is stable   Following barriers for discharge:                                                         Pain controlled with PO medications                                                           Will need consultants to evaluate patient prior to discharge       Consult Orders  (From admission, onward)           Start     Ordered   12/17/22 2033  Consult to hospitalist  Once       Provider:  (Not yet assigned)  Question Answer Comment  Place call to: [name]   Reason for Consult Admit      12/17/22 2032                               Would benefit from PT/OT eval prior to DC  Ordered                  Consults called: Neurology is aware will need AM consult w vascular surgery for possible biopsy   Admission status:  ED Disposition     ED Disposition  Admit   Condition  --   Comment  The patient appears reasonably stabilized for admission considering the current resources, flow, and capabilities available in the ED at this time, and I doubt any other [facility] requiring further screening and/or treatment in the ED prior to admission is  present.            Obs      Level of care     tele  For   24H       Lab Results  Component Value Date   SARSCOV2NAA NEGATIVE 12/03/2022     Precautions: admitted as   Covid Negative     Mansel Strother 12/17/2022, 10:23 PM    [facility]     after 2 AM please page floor coverage PA If 7AM-7PM, please contact the day team taking care of the patient using [email or web address]

## 2022-12-17 NOTE — Subjective & Objective (Signed)
Reports headache for the past 3 days right sided headache and neck pain Headache located in the temporal region and behind right ear.  No rash , no slurred speech, no weakness no localized neurologic complaints Pt has hx of CVA already on Plavix and asa no  prior hx of headaches

## 2022-12-18 ENCOUNTER — Observation Stay (HOSPITAL_BASED_OUTPATIENT_CLINIC_OR_DEPARTMENT_OTHER): Payer: Medicare HMO

## 2022-12-18 ENCOUNTER — Observation Stay (HOSPITAL_COMMUNITY): Payer: Medicare HMO

## 2022-12-18 DIAGNOSIS — I2510 Atherosclerotic heart disease of native coronary artery without angina pectoris: Secondary | ICD-10-CM | POA: Diagnosis not present

## 2022-12-18 DIAGNOSIS — E785 Hyperlipidemia, unspecified: Secondary | ICD-10-CM

## 2022-12-18 DIAGNOSIS — R519 Headache, unspecified: Secondary | ICD-10-CM

## 2022-12-18 DIAGNOSIS — Z1152 Encounter for screening for COVID-19: Secondary | ICD-10-CM | POA: Diagnosis not present

## 2022-12-18 DIAGNOSIS — G8929 Other chronic pain: Secondary | ICD-10-CM | POA: Diagnosis not present

## 2022-12-18 DIAGNOSIS — I69354 Hemiplegia and hemiparesis following cerebral infarction affecting left non-dominant side: Secondary | ICD-10-CM

## 2022-12-18 DIAGNOSIS — N401 Enlarged prostate with lower urinary tract symptoms: Secondary | ICD-10-CM | POA: Diagnosis not present

## 2022-12-18 DIAGNOSIS — Z7982 Long term (current) use of aspirin: Secondary | ICD-10-CM | POA: Diagnosis not present

## 2022-12-18 DIAGNOSIS — Z8673 Personal history of transient ischemic attack (TIA), and cerebral infarction without residual deficits: Secondary | ICD-10-CM | POA: Diagnosis not present

## 2022-12-18 DIAGNOSIS — I10 Essential (primary) hypertension: Secondary | ICD-10-CM | POA: Diagnosis not present

## 2022-12-18 DIAGNOSIS — I5032 Chronic diastolic (congestive) heart failure: Secondary | ICD-10-CM | POA: Diagnosis not present

## 2022-12-18 DIAGNOSIS — E440 Moderate protein-calorie malnutrition: Secondary | ICD-10-CM | POA: Insufficient documentation

## 2022-12-18 DIAGNOSIS — R351 Nocturia: Secondary | ICD-10-CM | POA: Diagnosis not present

## 2022-12-18 DIAGNOSIS — Z981 Arthrodesis status: Secondary | ICD-10-CM | POA: Diagnosis not present

## 2022-12-18 DIAGNOSIS — M316 Other giant cell arteritis: Secondary | ICD-10-CM | POA: Diagnosis not present

## 2022-12-18 DIAGNOSIS — M542 Cervicalgia: Secondary | ICD-10-CM | POA: Diagnosis not present

## 2022-12-18 DIAGNOSIS — E7800 Pure hypercholesterolemia, unspecified: Secondary | ICD-10-CM | POA: Diagnosis not present

## 2022-12-18 DIAGNOSIS — E871 Hypo-osmolality and hyponatremia: Secondary | ICD-10-CM | POA: Diagnosis not present

## 2022-12-18 DIAGNOSIS — I110 Hypertensive heart disease with heart failure: Secondary | ICD-10-CM | POA: Diagnosis not present

## 2022-12-18 DIAGNOSIS — G509 Disorder of trigeminal nerve, unspecified: Secondary | ICD-10-CM | POA: Diagnosis not present

## 2022-12-18 LAB — COMPREHENSIVE METABOLIC PANEL
ALT: 74 U/L — ABNORMAL HIGH (ref 0–44)
AST: 36 U/L (ref 15–41)
Albumin: 2.6 g/dL — ABNORMAL LOW (ref 3.5–5.0)
Alkaline Phosphatase: 68 U/L (ref 38–126)
Anion gap: 9 (ref 5–15)
BUN: 20 mg/dL (ref 8–23)
CO2: 22 mmol/L (ref 22–32)
Calcium: 8.4 mg/dL — ABNORMAL LOW (ref 8.9–10.3)
Chloride: 99 mmol/L (ref 98–111)
Creatinine, Ser: 0.76 mg/dL (ref 0.61–1.24)
GFR, Estimated: >60 mL/min (ref >60)
Glucose, Bld: 146 mg/dL — ABNORMAL HIGH (ref 70–99)
Potassium: 4.2 mmol/L (ref 3.5–5.1)
Sodium: 130 mmol/L — ABNORMAL LOW (ref 135–145)
Total Bilirubin: 0.2 mg/dL — ABNORMAL LOW (ref 0.3–1.2)
Total Protein: 6.1 g/dL — ABNORMAL LOW (ref 6.5–8.1)

## 2022-12-18 LAB — CBC
HCT: 33.8 % — ABNORMAL LOW (ref 39.0–52.0)
Hemoglobin: 11.1 g/dL — ABNORMAL LOW (ref 13.0–17.0)
MCH: 31.7 pg (ref 26.0–34.0)
MCHC: 32.8 g/dL (ref 30.0–36.0)
MCV: 96.6 fL (ref 80.0–100.0)
Platelets: 358 10*3/uL (ref 150–400)
RBC: 3.5 MIL/uL — ABNORMAL LOW (ref 4.22–5.81)
RDW: 12.8 % (ref 11.5–15.5)
WBC: 5 10*3/uL (ref 4.0–10.5)
nRBC: 0 % (ref 0.0–0.2)

## 2022-12-18 LAB — TSH: TSH: 2.266 u[IU]/mL (ref 0.350–4.500)

## 2022-12-18 LAB — PHOSPHORUS: Phosphorus: 3.5 mg/dL (ref 2.5–4.6)

## 2022-12-18 LAB — MAGNESIUM: Magnesium: 1.5 mg/dL — ABNORMAL LOW (ref 1.7–2.4)

## 2022-12-18 LAB — OSMOLALITY: Osmolality: 276 mOsm/kg (ref 275–295)

## 2022-12-18 LAB — PREALBUMIN: Prealbumin: 7 mg/dL — ABNORMAL LOW (ref 18–38)

## 2022-12-18 LAB — OSMOLALITY, URINE: Osmolality, Ur: 391 mOsm/kg (ref 300–900)

## 2022-12-18 MED ORDER — IOHEXOL 300 MG/ML  SOLN
75.0000 mL | Freq: Once | INTRAMUSCULAR | Status: AC | PRN
  Administered 2022-12-18: 75 mL via INTRAVENOUS

## 2022-12-18 MED ORDER — MAGNESIUM SULFATE 2 GM/50ML IV SOLN
2.0000 g | Freq: Once | INTRAVENOUS | Status: AC
  Administered 2022-12-18: 2 g via INTRAVENOUS
  Filled 2022-12-18: qty 50

## 2022-12-18 MED ORDER — SODIUM CHLORIDE (PF) 0.9 % IJ SOLN
INTRAMUSCULAR | Status: AC
  Filled 2022-12-18: qty 50

## 2022-12-18 MED ORDER — PROSOURCE PLUS PO LIQD
30.0000 mL | Freq: Two times a day (BID) | ORAL | Status: DC
  Administered 2022-12-19: 30 mL via ORAL
  Filled 2022-12-18: qty 30

## 2022-12-18 MED ORDER — ADULT MULTIVITAMIN W/MINERALS CH
1.0000 | ORAL_TABLET | Freq: Every day | ORAL | Status: DC
  Administered 2022-12-19: 1 via ORAL
  Filled 2022-12-18: qty 1

## 2022-12-18 MED ORDER — ENSURE ENLIVE PO LIQD
237.0000 mL | Freq: Two times a day (BID) | ORAL | Status: DC
  Administered 2022-12-18: 237 mL via ORAL

## 2022-12-18 MED ORDER — ENSURE ENLIVE PO LIQD
237.0000 mL | Freq: Two times a day (BID) | ORAL | Status: DC
  Administered 2022-12-19: 237 mL via ORAL

## 2022-12-18 NOTE — Progress Notes (Signed)
PT Cancellation Note  Patient Details Name: Howard Jackson MRN: [id number] DOB: Sep 26, 1945   Cancelled Treatment:    Reason Eval/Treat Not Completed: Patient at procedure or test/unavailable (CT)   Kati L Payson 12/18/2022, 10:56 AM

## 2022-12-18 NOTE — Evaluation (Signed)
Physical Therapy One Time Evaluation Patient Details Name: Howard Jackson MRN: [id number] DOB: [birth year] Today's Date: 12/18/2022  History of Present Illness  Pt is a 77 year old male admitted for new onset right-sided headache and elevated inflammatory markers, highly concerning for temporal arteritis.  Past medical history significant of benign adrenal pheochromocytoma, primary aldosteronism, hypertension, hyperlipidemia, CVA with residual left-sided hemiparesis, anxiety, CAD, PVD, BPH, HTN, HLD, left femoral fracture s/p Left hip hemiarthroplasty, anterior approach.  Clinical Impression  Patient evaluated by Physical Therapy with no further acute PT needs identified. All education has been completed and the patient has no further questions.  Pt reports use of motorized w/c at baseline as well as has daily caregiver for a few hours each day to assist with ADLs and IADLs.  Pt able to transfer from bed to recliner today without physical assist and states he feels his mobility is currently at baseline.  Pt would benefit from continuing to transfer OOB daily during this admission as he anticipates return home.  PT is signing off. Thank you for this referral.        Recommendations for follow up therapy are one component of a multi-disciplinary discharge planning process, led by the attending physician.  Recommendations may be updated based on patient status, additional functional criteria and insurance authorization.  Follow Up Recommendations       Assistance Recommended at Discharge PRN  Patient can return home with the following       Equipment Recommendations Other (comment) (pt requesting elevated toilet seat)  Recommendations for Other Services       Functional Status Assessment Patient has not had a recent decline in their functional status     Precautions / Restrictions Precautions Precautions: Fall Precaution Comments: left hemiparesis      Mobility  Bed  Mobility Overal bed mobility: Needs Assistance Bed Mobility: Supine to Sit     Supine to sit: Supervision, HOB elevated     General bed mobility comments: utilized elevated HOB, has this feature at home    Transfers Overall transfer level: Needs assistance Equipment used: None Transfers: Sit to/from Stand Sit to Stand: Min guard           General transfer comment: min/guard for safety however no physical assist required, positioned recliner next to bed per pt preference, pt uses armrests of recliner to stabilize assist with transfer    Ambulation/Gait                  Stairs            Wheelchair Mobility    Modified Rankin (Stroke Patients Only)       Balance                                             Pertinent Vitals/Pain Pain Assessment Pain Assessment: No/denies pain    Home Living Family/patient expects to be discharged to:: Private residence Living Arrangements: Alone Available Help at Discharge: Personal care attendant   Home Access: Ramped entrance       Home Layout: One level Home Equipment: Wheelchair - power;Grab bars - toilet      Prior Function Prior Level of Function : Independent/Modified Independent;Needs assist             Mobility Comments: reports he can perform transfers independently to/from power w/c however usually waits for  his caregiver, caregiver from 1130 - 1330 ish daily       Hand Dominance        Extremity/Trunk Assessment   Upper Extremity Assessment Upper Extremity Assessment: LUE deficits/detail LUE Deficits / Details: L UE deficits from previous CVA    Lower Extremity Assessment Lower Extremity Assessment: LLE deficits/detail LLE Deficits / Details: L LE deficits from previous CVA       Communication   Communication: No difficulties  Cognition Arousal/Alertness: Awake/alert Behavior During Therapy: WFL for tasks assessed/performed, Flat affect Overall Cognitive  Status: Within Functional Limits for tasks assessed                                          General Comments      Exercises     Assessment/Plan    PT Assessment Patient does not need any further PT services  PT Problem List         PT Treatment Interventions      PT Goals (Current goals can be found in the Care Plan section)  Acute Rehab PT Goals PT Goal Formulation: All assessment and education complete, DC therapy    Frequency       Co-evaluation               AM-PAC PT "6 Clicks" Mobility  Outcome Measure Help needed turning from your back to your side while in a flat bed without using bedrails?: A Little Help needed moving from lying on your back to sitting on the side of a flat bed without using bedrails?: A Little Help needed moving to and from a bed to a chair (including a wheelchair)?: A Little Help needed standing up from a chair using your arms (e.g., wheelchair or bedside chair)?: A Little Help needed to walk in hospital room?: Total Help needed climbing 3-5 steps with a railing? : Total 6 Click Score: 14    End of Session   Activity Tolerance: Patient tolerated treatment well Patient left: in chair;with call bell/phone within reach;with chair alarm set Nurse Communication: Mobility status PT Visit Diagnosis: Other abnormalities of gait and mobility (R26.89)    Time: 4098-1191 PT Time Calculation (min) (ACUTE ONLY): 21 min   Charges:   PT Evaluation $PT Eval Low Complexity: 1 Low         Kati PT, DPT Physical Therapist Acute Rehabilitation Services Office: [phone]   Janan Halter Payson 12/18/2022, 3:44 PM

## 2022-12-18 NOTE — Progress Notes (Signed)
Initial Nutrition Assessment  DOCUMENTATION CODES:   Non-severe (moderate) malnutrition in context of chronic illness  INTERVENTION:  - Heart Healthy diet per MD.  - ProSource Plus BID, each supplement provides 100 kcal and 15 grams of protein - Encourage intake at all meals.  - Multivitamin with minerals daily - Monitor weight trends.   NUTRITION DIAGNOSIS:   Moderate Malnutrition related to chronic illness as evidenced by mild fat depletion, moderate muscle depletion.  GOAL:   Patient will meet greater than or equal to 90% of their needs  MONITOR:   PO intake, Supplement acceptance, Labs, Weight trends  REASON FOR ASSESSMENT:   Malnutrition Screening Tool    ASSESSMENT:   77 y.o. male with PMH Stroke w Right side deficit, HLD, HTN, BPH,  neuromuscular disorder, primary aldosteronism,CAD  and PAD who presented with headache, admitted for temporal arteritis.  Patient endorses a UBW of 134# and steady weight loss over the past 7-8 months.  Per EMR, there is a gap in weight history from June 2023 to March 2024. However, patient has lost 5# or 3.8% over the past 2 weeks which is significant.   Patient admits he has only been eating around 1-2 meals a day at home due to poor appetite. Had tried Ensure but "heard bad things about it" so stopped. Had then tried a protein powder and Boost but was not drinking consistently.   Current appetite is somewhat improved. RD observed patient's breakfast tray sitting in room and patient had consumed 100%. Notes he has been trying to emphasize protein rich food sources. Encouraged patient to order and try and consume 3 meals a day. He enjoys Boost but doesn't like chocolate (only flavor available inpatient). He is worried about too much sugar in other supplements so agreeable to try ProSource Plus.   Medications reviewed and include: Senokot  Labs reviewed:  Na 130 Magnesium 1.5   NUTRITION - FOCUSED PHYSICAL EXAM:  Flowsheet Row  Most Recent Value  Orbital Region No depletion  Upper Arm Region Moderate depletion  Thoracic and Lumbar Region Mild depletion  Buccal Region No depletion  Temple Region No depletion  Clavicle Bone Region Mild depletion  Clavicle and Acromion Bone Region Moderate depletion  Scapular Bone Region Unable to assess  Dorsal Hand Mild depletion  Patellar Region Moderate depletion  Anterior Thigh Region Moderate depletion  Posterior Calf Region Severe depletion  Edema (RD Assessment) None  Hair Reviewed  Eyes Reviewed  Mouth Reviewed  Skin Reviewed  Nails Reviewed       Diet Order:   Diet Order             Diet Heart Room service appropriate? Yes; Fluid consistency: Thin  Diet effective now                   EDUCATION NEEDS:  Education needs have been addressed  Skin:  Skin Assessment: Reviewed RN Assessment  Last BM:  6/5  Height:  Ht Readings from Last 1 Encounters:  12/17/22 5\' 6"  (1.676 m)   Weight:  Wt Readings from Last 1 Encounters:  12/17/22 56.3 kg    BMI:  Body mass index is 20.03 kg/m.  Estimated Nutritional Needs:  Kcal:  1700-1850 kcals Protein:  70-85 grams Fluid:  >/= 1.7L    Shelle Iron RD, LDN For contact information, refer to [facility].

## 2022-12-18 NOTE — Evaluation (Signed)
Occupational Therapy Evaluation Patient Details Name: Howard Jackson MRN: [id number] DOB: May 05, 1946 Today's Date: 12/18/2022   History of Present Illness Pt is a 77 year old male admitted for new onset right-sided headache and elevated inflammatory markers, highly concerning for temporal arteritis.  Past medical history significant of benign adrenal pheochromocytoma, primary aldosteronism, hypertension, hyperlipidemia, CVA with residual left-sided hemiparesis, anxiety, CAD, PVD, BPH, HTN, HLD, left femoral fracture s/p Left hip hemiarthroplasty, anterior approach.   Clinical Impression   The pt has chronic L sided weakness from a prior CVA. He has daily caregivers who assist him with household IADLs, bathing and dressing. At current, he appears to be at his baseline level of functioning for self-care management, and he is not presenting with acute functional deficits that warrant the need for OT services in the hospital setting. OT recommends he return home with resumption of home health OT and PT, upon his hospital discharge.       Recommendations for follow up therapy are one component of a multi-disciplinary discharge planning process, led by the attending physician.  Recommendations may be updated based on patient status, additional functional criteria and insurance authorization.   Assistance Recommended at Discharge Intermittent Supervision/Assistance  Patient can return home with the following Assist for transportation;Assistance with cooking/housework;A lot of help with bathing/dressing/bathroom    Functional Status Assessment  Patient has not had a recent decline in their functional status  Equipment Recommendations  None recommended by OT       Precautions / Restrictions Precautions Precautions: Fall Precaution Comments: left hemiparesis Restrictions Weight Bearing Restrictions: No Other Position/Activity Restrictions: old LUE and LLE weakness from CVA      Mobility  Bed Mobility Overal bed mobility: Needs Assistance Bed Mobility: Sit to Supine       Sit to supine: Supervision        Transfers Overall transfer level: Needs assistance Equipment used: None Transfers: Bed to chair/wheelchair/BSC Sit to Stand: Min assist                      ADL either performed or assessed with clinical judgement   ADL Overall ADL's : At baseline             Vision Baseline Vision/History: 1 Wears glasses Patient Visual Report: No change from baseline              Pertinent Vitals/Pain Pain Assessment Pain Assessment: No/denies pain     Hand Dominance Right   Extremity/Trunk Assessment Upper Extremity Assessment Upper Extremity Assessment: LUE deficits/detail LUE Deficits / Details: L UE deficits from previous CVA; no functional AROM or strength noted RUE AROM and strength WFL   Lower Extremity Assessment Lower Extremity Assessment: LLE deficits/detail LLE Deficits / Details: L LE deficits from previous CVA; no functional AROM or strength noted        Communication Communication Communication: No difficulties   Cognition   Behavior During Therapy: WFL for tasks assessed/performed, Flat affect Overall Cognitive Status: Within Functional Limits for tasks assessed          General Comments: Oriented x4, able to follow commands                Home Living Family/patient expects to be discharged to:: Private residence Living Arrangements: Alone Available Help at Discharge: Personal care attendant Type of Home: Apartment Home Access: Level entry     Home Layout: One level     Bathroom Shower/Tub: Tub/shower unit  Home Equipment: Wheelchair - power;Tub bench   Additional Comments: He has daily home health aides for ~3 hours each day. He was receiving home health PT and OT.      Prior Functioning/Environment Prior Level of Function : Needs assist             Mobility Comments: reports he can  perform transfers independently to/from power w/c, however usually waits for his caregiver, caregiver for ~ 3 hours each day ADLs Comments: He required assist from his home health aides/caregiver for cleaning, meal prep, showering, and dressing. He performed toileting without assist (used power wheelchair to get into bathroom at home).              OT Treatment/Interventions:   No further treatment treatment needs in the acute setting identified    OT Goals(Current goals can be found in the care plan section) Acute Rehab OT Goals Patient Stated Goal: to return home soon with continued home therapy OT Goal Formulation: With patient  OT Frequency:         AM-PAC OT "6 Clicks" Daily Activity     Outcome Measure Help from another person eating meals?: None Help from another person taking care of personal grooming?: None Help from another person toileting, which includes using toliet, bedpan, or urinal?: A Little Help from another person bathing (including washing, rinsing, drying)?: A Lot Help from another person to put on and taking off regular upper body clothing?: A Lot Help from another person to put on and taking off regular lower body clothing?: A Lot 6 Click Score: 17   End of Session Equipment Utilized During Treatment: Gait belt Nurse Communication: Mobility status  Activity Tolerance: Patient tolerated treatment well Patient left: in bed;with call bell/phone within reach;with bed alarm set  OT Visit Diagnosis: Muscle weakness (generalized) (M62.81)                Time: 4098-1191 OT Time Calculation (min): 22 min Charges:  OT General Charges $OT Visit: 1 Visit OT Evaluation $OT Eval Moderate Complexity: 1 Mod    Howard Jackson, OTR/L 12/18/2022, 5:36 PM

## 2022-12-18 NOTE — Progress Notes (Signed)
VASCULAR LAB    Temporal artery duplex has been performed.  See CV proc for preliminary results.   Malacai Grantz, RVT 12/18/2022, 9:15 AM

## 2022-12-18 NOTE — Consult Note (Signed)
Neurology Consultation Reason for Consult: headache Referring Physician: Adela Glimpse, A  CC: headache  History is obtained from: Patient, daughter  HPI: Howard Jackson is a 77 y.o. male with a history of of CVA, hypertension, hyperlipidemia who presents with right-sided headaches.  He states that he has been hurting for about the last 3 days.  He has never had a headache like this before, and does not typically get headaches at all.  He does have neck pain, but this is a chronic complaint, and he does not feel it is significantly different than typical.  He denies jaw claudication, but does complain of pain because of a "bad tooth."  He has had some episodes of vertigo over the past month, but is not having any currently.  Past Medical History:  Diagnosis Date   Adrenal gland disorder (HCC)    tumor present - no change-  no recent increase     Arthritis    Benign pheochromocytoma of right adrenal gland 06/23/2014   Never had histological diagnosis, but reportedly initially diagnosed in 1989   Bunion    left foot   Complication of anesthesia    "died on the table during cervical fusion" '89 - believed to be d/t "too much medication" to treat HTN and was later dx with pheochromocytoma   Coronary artery disease    cleared for surg. by Dr. Allyson Sabal - [facility]   H/O hiatal hernia    Hammer toe    Herniated lumbar intervertebral disc    History of anxiety    Hyperlipidemia    Hypertension    Neurogenic bladder    Neuromuscular disorder (HCC)    carpal tunnel problem, even after surgery release    Peripheral arterial disease (HCC)    Primary aldosteronism (HCC)    Stroke (HCC)    in [place]- 1999, L sided weakness, wears a brace on L leg & uses cane     Family History  Problem Relation Age of Onset   CVA Father    Diabetes Father    Alzheimer's disease Father    Hypertension Sister    Hypertension Brother    CVA Paternal Grandmother    Hypertension Brother    Hypertension Mother       Social History:  reports that he has never smoked. He has never used smokeless tobacco. He reports current alcohol use. He reports that he does not use drugs.   Exam: Current vital signs: BP 138/71 (BP Location: Left Arm)   Pulse 69   Temp 97.7 F (36.5 C) (Oral)   Resp (!) 21   Ht 5\' 6"  (1.676 m)   Wt 56.3 kg   SpO2 100%   BMI 20.03 kg/m  Vital signs in last 24 hours: Temp:  [97.7 F (36.5 C)-98.5 F (36.9 C)] 97.7 F (36.5 C) (06/05 2346) Pulse Rate:  [69-86] 69 (06/05 2346) Resp:  [16-21] 21 (06/05 2346) BP: (132-142)/(66-77) 138/71 (06/05 2346) SpO2:  [99 %-100 %] 100 % (06/05 2346) Weight:  [56.3 kg] 56.3 kg (06/05 2309)   Physical Exam  Appears well-developed and well-nourished.   Neuro: Mental Status: Patient is awake, alert, oriented to person, place, month, year, and situation. Patient is able to give a clear and coherent history. No signs of aphasia or neglect Cranial Nerves: II: Visual Fields are full. Pupils are equal, round, and reactive to light.   III,IV, VI: EOMI without ptosis or diploplia.  V: Facial sensation is symmetric to temperature VII: Facial movement with  very mild weakness in the left VIII: hearing is intact to voice X: Uvula elevates symmetrically XI: Shoulder shrug is symmetric. XII: tongue is midline without atrophy or fasciculations.  Motor: Is a spastic hemiparesis affecting the arm >leg, he has very little proximal movement of the left arm but is able to open his hand and use it some.  He has good strength in the right Sensory: Sensation is symmetric to light touch and temperature in the arms and legs. Cerebellar: Unable to perform on left     I have reviewed labs in epic and the results pertinent to this consultation are: Ferritin 670 CRP 15 ESR 81  I have reviewed the images obtained: CT head-negative  Impression: 77 year old male with new onset right-sided headache and elevated inflammatory markers.  This is  highly concerning for temporal arteritis, and I advised starting steroids.  He will need vascular surgery consultation in the morning for consideration of biopsy.  Would also like to exclude mimics with maxillofacial CT (looking for dental abscess) as well as otoscopy.    Recommendations: 1) temporal artery ultrasound 2) maxillofacial CT to rule out abscess 3) prednisone 1 mg/kg 4) neurology will be available as the workup is undertaken.   Ritta Slot, MD [facility] [phone]  If 7pm- 7am, please page neurology on call as listed in AMION.

## 2022-12-18 NOTE — Progress Notes (Addendum)
[name]  PROGRESS NOTE  Howard Jackson ZOX:[id number] DOB: [birth year] DOA: 12/17/2022 PCP: Soundra Pilon, FNP   Brief HPI:   77 year old male with history of CVA, hypertension, hyperlipidemia, BPH, neuromuscular disorder, primary aldosteronism, CAD, PAD presented with headache for past 3 days on right side.  Headache located in temporal region behind right ear.  Patient is already on Plavix and aspirin due to history of CVA. Neurology consulted    Assessment/Plan:   Headache/?  Temporal arteritis -Temporal artery ultrasound obtained this morning; result pending -CT maxillofacial obtained, result pending -CRP 15.6 -Currently on prednisone 60 mg daily -Headache has improved -Discussed with vascular surgeon Dr. Chestine Spore, he recommends to discharge home and follow-up in his office next week for evaluation for temporal artery biopsy -Ear exam benign.  Chronic diastolic CHF -Euvolemic  History of stroke -Aspirin Plavix currently on hold in anticipation for biopsy  Hypertension -Continue labetalol, Aldactone  BPH -Continue Flomax, Proscar  Hyperlipidemia -Continue Crestor  Hyponatremia -TSH 2.26 -Chest x-ray unremarkable  Hypomagnesemia -Magnesium 1.5 -Will replace magnesium and follow mag level in a.m.  Medications     (feeding supplement) PROSource Plus  30 mL Oral BID BM   amoxicillin-clavulanate  1 tablet Oral Q12H   finasteride  5 mg Oral Daily   labetalol  300 mg Oral BID   [START ON 12/19/2022] multivitamin with minerals  1 tablet Oral Daily   predniSONE  60 mg Oral Q breakfast   rosuvastatin  20 mg Oral Daily   senna  1 tablet Oral BID   spironolactone  100 mg Oral Daily   tamsulosin  0.4 mg Oral Daily     Data Reviewed:   CBG:  No results for input(s): "GLUCAP" in the last 168 hours.  SpO2: 100 %    Vitals:   12/18/22 0314 12/18/22 0712 12/18/22 0931 12/18/22 1405  BP: 123/71 (!) 144/75 (!) 146/76 (!) 146/74  Pulse: 69 72 71 67   Resp: 18 18 16 16   Temp: (!) 97.3 F (36.3 C) 97.7 F (36.5 C) 98.1 F (36.7 C) 98.2 F (36.8 C)  TempSrc: Oral Oral Oral Oral  SpO2: 98% 100% 100% 100%  Weight:      Height:          Data Reviewed:  Basic Metabolic Panel: Recent Labs  Lab 12/17/22 1735 12/18/22 0436  NA 128* 130*  K 4.2 4.2  CL 94* 99  CO2 22 22  GLUCOSE 107* 146*  BUN 24* 20  CREATININE 0.98 0.76  CALCIUM 8.9 8.4*  MG 1.7 1.5*  PHOS 3.0 3.5    CBC: Recent Labs  Lab 12/17/22 1735 12/18/22 0436  WBC 8.0 5.0  NEUTROABS 5.5  --   HGB 12.1* 11.1*  HCT 36.7* 33.8*  MCV 94.8 96.6  PLT 388 358    LFT Recent Labs  Lab 12/17/22 1735 12/18/22 0436  AST 39 36  ALT 82* 74*  ALKPHOS 86 68  BILITOT 0.5 0.2*  PROT 7.1 6.1*  ALBUMIN 3.0* 2.6*     Antibiotics: Anti-infectives (From admission, onward)    Start     Dose/Rate Route Frequency Ordered Stop   12/17/22 2230  amoxicillin-clavulanate (AUGMENTIN) 875-125 MG per tablet 1 tablet        1 tablet Oral Every 12 hours 12/17/22 2221          DVT prophylaxis: SCDs  Code Status: Full code  Family Communication:    CONSULTS neurology   Subjective   Headache has improved  Objective    Physical Examination:   General-appears in no acute distress HEENT-right ear examined, mild wax, no bulging of tympanic membrane, no erythema noted Heart-S1-S2, regular, no murmur auscultated Lungs-clear to auscultation bilaterally, no wheezing or crackles auscultated Abdomen-soft, nontender, no organomegaly Extremities-no edema in the lower extremities Neuro-alert, oriented x3, no focal deficit noted   Status is: Inpatient:             Meredeth Ide   [facility] If 7PM-7AM, please contact night-coverage at [email or web address], Office  [phone]   12/18/2022, 4:43 PM  LOS: 0 days

## 2022-12-19 DIAGNOSIS — I5032 Chronic diastolic (congestive) heart failure: Secondary | ICD-10-CM | POA: Diagnosis not present

## 2022-12-19 DIAGNOSIS — I2510 Atherosclerotic heart disease of native coronary artery without angina pectoris: Secondary | ICD-10-CM | POA: Diagnosis not present

## 2022-12-19 DIAGNOSIS — E871 Hypo-osmolality and hyponatremia: Secondary | ICD-10-CM | POA: Diagnosis not present

## 2022-12-19 DIAGNOSIS — R351 Nocturia: Secondary | ICD-10-CM | POA: Diagnosis not present

## 2022-12-19 DIAGNOSIS — G4489 Other headache syndrome: Secondary | ICD-10-CM | POA: Diagnosis not present

## 2022-12-19 DIAGNOSIS — Z7401 Bed confinement status: Secondary | ICD-10-CM | POA: Diagnosis not present

## 2022-12-19 DIAGNOSIS — M316 Other giant cell arteritis: Secondary | ICD-10-CM | POA: Diagnosis not present

## 2022-12-19 DIAGNOSIS — N401 Enlarged prostate with lower urinary tract symptoms: Secondary | ICD-10-CM | POA: Diagnosis not present

## 2022-12-19 DIAGNOSIS — Z8673 Personal history of transient ischemic attack (TIA), and cerebral infarction without residual deficits: Secondary | ICD-10-CM | POA: Diagnosis not present

## 2022-12-19 LAB — BASIC METABOLIC PANEL
Anion gap: 10 (ref 5–15)
BUN: 28 mg/dL — ABNORMAL HIGH (ref 8–23)
CO2: 23 mmol/L (ref 22–32)
Calcium: 8.6 mg/dL — ABNORMAL LOW (ref 8.9–10.3)
Chloride: 99 mmol/L (ref 98–111)
Creatinine, Ser: 0.81 mg/dL (ref 0.61–1.24)
GFR, Estimated: >60 mL/min (ref >60)
Glucose, Bld: 148 mg/dL — ABNORMAL HIGH (ref 70–99)
Potassium: 3.9 mmol/L (ref 3.5–5.1)
Sodium: 132 mmol/L — ABNORMAL LOW (ref 135–145)

## 2022-12-19 LAB — GLUCOSE, CAPILLARY: Glucose-Capillary: 122 mg/dL — ABNORMAL HIGH (ref 70–99)

## 2022-12-19 LAB — MAGNESIUM: Magnesium: 1.9 mg/dL (ref 1.7–2.4)

## 2022-12-19 MED ORDER — PREDNISONE 20 MG PO TABS: 60.0000 mg | ORAL_TABLET | Freq: Every day | ORAL | 1 refills | Status: DC

## 2022-12-19 MED ORDER — NIFEDIPINE ER OSMOTIC RELEASE 30 MG PO TB24
30.0000 mg | ORAL_TABLET | Freq: Every morning | ORAL | Status: DC
  Filled 2022-12-19: qty 1

## 2022-12-19 MED ORDER — METHOCARBAMOL 500 MG PO TABS
500.0000 mg | ORAL_TABLET | Freq: Once | ORAL | Status: DC
  Filled 2022-12-19: qty 1

## 2022-12-19 MED ORDER — NIFEDIPINE ER OSMOTIC RELEASE 60 MG PO TB24
60.0000 mg | ORAL_TABLET | Freq: Every morning | ORAL | Status: DC
  Administered 2022-12-19: 60 mg via ORAL
  Filled 2022-12-19: qty 1

## 2022-12-19 MED ORDER — PANTOPRAZOLE SODIUM 40 MG PO TBEC: 40.0000 mg | DELAYED_RELEASE_TABLET | Freq: Every day | ORAL | 1 refills | Status: AC

## 2022-12-19 MED ORDER — AMOXICILLIN-POT CLAVULANATE 875-125 MG PO TABS: 1.0000 | ORAL_TABLET | Freq: Two times a day (BID) | ORAL | 0 refills | Status: DC

## 2022-12-19 NOTE — Progress Notes (Signed)
Transition of Care [facility]) - Inpatient Brief Assessment   Patient Details  Name: Howard Jackson MRN: [id number] Date of Birth: Oct 11, 1945  Transition of Care [facility]) CM/SW Contact:    Larrie Kass, LCSW Phone Number: 12/19/2022, 9:42 AM   Clinical Narrative:  Transition of Care Department [facility]) has reviewed patient and no TOC needs have been identified at this time. We will continue to monitor patient advancement through interdisciplinary progression rounds. If new patient transition needs arise, please place a TOC consult.    Transition of Care Asessment: Insurance and Status: Insurance coverage has been reviewed Patient has primary care physician: Yes Home environment has been reviewed: yes   Prior/Current Home Services: Current home services Social Determinants of Health Reivew: SDOH reviewed no interventions necessary Readmission risk has been reviewed: Yes Transition of care needs: no transition of care needs at this time

## 2022-12-19 NOTE — Progress Notes (Signed)
CSW has contacted [facility]. Patient will need to follow up with insurance company for PCP providers.

## 2022-12-19 NOTE — Progress Notes (Signed)
The patient is stable, no change from am assessment. Discharge instructions were reviewed with the patient. Pt encouraged to seek follow up visit with primary care (identify new provider per family request. Pt to contact list of providers by contacting insurance), vascular (4 days) and neurology (7 days). Questions, concerns denied. Family members at bedside during dc instruction review. Pt is awaiting arrival of [facility].

## 2022-12-19 NOTE — Progress Notes (Signed)
Robaxin refused. The patient states the medication will make him drowsy.

## 2022-12-19 NOTE — Discharge Summary (Signed)
Physician Discharge Summary   Patient: Howard Jackson MRN: [id number] DOB: 12-Jan-1946  Admit date:     12/17/2022  Discharge date: 12/19/22  Discharge Physician: Meredeth Ide   PCP: Soundra Pilon, FNP   Recommendations at discharge:   Follow-up vascular surgery in 4 days for temporal artery biopsy Follow result of temporal artery ultrasound in the hospital Follow-up neurology in 2 weeks  Discharge Diagnoses: Principal Problem:   Temporal arteritis (HCC) Active Problems:   PAD (peripheral artery disease) (HCC)   HTN (hypertension)   History of stroke   Coronary artery disease involving native coronary artery of native heart without angina pectoris   BPH (benign prostatic hyperplasia)   Hypercholesterolemia   Chronic diastolic CHF (congestive heart failure) (HCC)   Hyponatremia   Tooth infection   Malnutrition of moderate degree  Resolved Problems:   * No resolved hospital problems. *  Hospital Course:  77 year old male with history of CVA, hypertension, hyperlipidemia, BPH, neuromuscular disorder, primary aldosteronism, CAD, PAD presented with headache for past 3 days on right side.  Headache located in temporal region behind right ear.  Patient is already on Plavix and aspirin due to history of CVA. Neurology consulted    Assessment and Plan:  Headache/?  Temporal arteritis -Temporal artery ultrasound obtained this morning; result pending -CT maxillofacial obtained, result pending -CRP 15.6 -Currently on prednisone 60 mg daily -Headache has improved -Discussed with vascular surgeon Dr. Chestine Spore, he recommends to discharge home and follow-up in his office next week for evaluation for temporal artery biopsy -Ear exam benign. -Discussed with Dr. Cammy Copa, he recommends to discharge on prednisone 60 mg daily for 4 weeks with 1 refill.  Patient will follow-up with vascular surgery as well as neurology and prednisone has to be tapered off.  Will also start  Protonix 40 mg daily to be given along with prednisone 60 mg daily.   Chronic diastolic CHF -Euvolemic   History of stroke -Patient takes Plavix daily, takes aspirin as needed for headache. -Will continue with Plavix   Hypertension -Continue labetalol, Aldactone   BPH -Continue Flomax, Proscar   Hyperlipidemia -Continue Crestor   Hyponatremia -Sodium improved to 132 -TSH 2.26 -Chest x-ray unremarkable   Hypomagnesemia -Replete       Consultants:  Procedures performed:  Disposition: Home Diet recommendation:  Discharge Diet Orders (From admission, onward)     Start     Ordered   12/19/22 0000  Diet - low sodium heart healthy        12/19/22 1627           Regular diet DISCHARGE MEDICATION: Allergies as of 12/19/2022       Reactions   Amitriptyline Hcl Hypertension   Other Shortness Of Breath, Other (See Comments)   SKIN is sensitive- Guards for the underwear MUST be removed immediately after using Anesthesia- "They almost could not get me back" (HAS TO TAKE SPIRONOLACTONE NOW)   Losartan Potassium Other (See Comments)   Hypotension   Papaya Derivatives Itching, Other (See Comments)   Itching of the throat   Sulfa Antibiotics Other (See Comments)   Constipation         Medication List     STOP taking these medications    famotidine 10 MG tablet Commonly known as: PEPCID       TAKE these medications    amoxicillin-clavulanate 875-125 MG tablet Commonly known as: AUGMENTIN Take 1 tablet by mouth every 12 (twelve) hours.   aspirin 325 MG tablet  Take 325 mg by mouth daily as needed for mild pain or headache.   Baclofen 5 MG Tabs Take 5 mg by mouth as needed.   BOSWELLIA PO Take 1 capsule by mouth daily as needed (for pain).   CITRUS BERGAMOT PO Take 1 capsule by mouth daily as needed (for pain).   clopidogrel 75 MG tablet Commonly known as: PLAVIX TAKE 1 TABLET BY MOUTH EVERY DAY   finasteride 5 MG tablet Commonly known as:  PROSCAR Take 5 mg by mouth daily.   gabapentin 100 MG capsule Commonly known as: NEURONTIN Take 100 mg by mouth daily as needed (for pain).   hydrocortisone 2.5 % rectal cream Commonly known as: ANUSOL-HC Place 1 Application rectally 2 (two) times daily as needed for hemorrhoids (or pain).   labetalol 300 MG tablet Commonly known as: NORMODYNE TAKE 1 TABLET BY MOUTH 2 TIMES DAILY.   lactobacillus acidophilus Tabs tablet Take 2 tablets by mouth 2 (two) times daily. What changed: when to take this   magnesium hydroxide 400 MG/5ML suspension Commonly known as: MILK OF MAGNESIA Take 15-30 mLs by mouth daily as needed for mild constipation.   NIFEdipine 30 MG 24 hr tablet Commonly known as: ADALAT CC Take 30 mg by mouth in the morning.   NIFEdipine 60 MG 24 hr tablet Commonly known as: ADALAT CC Take 1 tablet (60 mg total) by mouth every evening.   NON FORMULARY Take 1-2 capsules by mouth See admin instructions. Nitric Oxide Beet Root Capsules- Take 1-2 capsules by mouth once a day   NON FORMULARY Take 14 g by mouth See admin instructions. MCT Oil - Keto & Vegan MCTs C8, C10 from Coconuts- Mix 14 grams into a desired drink or food and consume by mouth once a day   NON FORMULARY Take 1 capsule by mouth See admin instructions. [place] Rhubarb - Herbal Supplement for Digestive Health - Natural Formula- Take 1 capsule by mouth once a day as needed to support digestive health   pantoprazole 40 MG tablet Commonly known as: PROTONIX Take 1 tablet (40 mg total) by mouth daily. Take along with prednisone What changed:  how to take this when to take this reasons to take this additional instructions   predniSONE 20 MG tablet Commonly known as: DELTASONE Take 3 tablets (60 mg total) by mouth daily with breakfast. Start taking on: December 20, 2022   Qunol Ultra CoQ10 100-150 MG-UNIT Caps Generic drug: Coenzyme Q10-Vitamin E Take 1 capsule by mouth daily.   rosuvastatin 20 MG  tablet Commonly known as: CRESTOR TOME UNA TABLETA TODOS LOS DIAS What changed: See the new instructions.   senna-docusate 8.6-50 MG tablet Commonly known as: Senokot-S Take 3 tablets by mouth every evening. What changed:  when to take this reasons to take this   spironolactone 100 MG tablet Commonly known as: ALDACTONE Take 1 tablet (100 mg total) by mouth daily.   tamsulosin 0.4 MG Caps capsule Commonly known as: FLOMAX Take 1 capsule (0.4 mg total) by mouth daily. What changed: when to take this   tiZANidine 2 MG tablet Commonly known as: ZANAFLEX Take 2 mg by mouth daily as needed for muscle spasms.   triamcinolone ointment 0.1 % Commonly known as: KENALOG Apply 1 Application topically 2 (two) times daily as needed (for dryness).   Vitamin D3 125 MCG (5000 UT) Caps Take 5,000 Units by mouth every other day.        Follow-up Information     Soundra Pilon,  FNP .   Specialty: Family Medicine Contact information: [address] [place] [address] [phone]         Cephus Shelling, MD. Schedule an appointment as soon as possible for a visit in 4 day(s).   Specialty: Vascular Surgery Contact information: [address] [place] [address] [phone]         Levert Feinstein, MD. Schedule an appointment as soon as possible for a visit in 1 week(s).   Specialty: Neurology Contact information: [address] [place] [address] [phone]                Discharge Exam: Ceasar Mons Weights   12/17/22 2309  Weight: 56.3 kg   General-appears in no acute distress Heart-S1-S2, regular, no murmur auscultated Lungs-clear to auscultation bilaterally, no wheezing or crackles auscultated Abdomen-soft, nontender, no organomegaly Extremities-no edema in the lower extremities Neuro-alert, oriented x3, no focal deficit noted  Condition at discharge: good  The results of significant diagnostics from this hospitalization (including  imaging, microbiology, ancillary and laboratory) are listed below for reference.   Imaging Studies: CT MAXILLOFACIAL W CONTRAST  Result Date: 12/18/2022 CLINICAL DATA:  Sensory abnormality, trigeminal origin (CN 5) EXAM: CT MAXILLOFACIAL WITH CONTRAST TECHNIQUE: Multidetector CT imaging of the maxillofacial structures was performed with intravenous contrast. Multiplanar CT image reconstructions were also generated. RADIATION DOSE REDUCTION: This exam was performed according to the departmental dose-optimization program which includes automated exposure control, adjustment of the mA and/or kV according to patient size and/or use of iterative reconstruction technique. CONTRAST:  75mL OMNIPAQUE IOHEXOL 300 MG/ML  SOLN COMPARISON:  None Available. FINDINGS: Osseous: No fracture or mandibular dislocation. No destructive process. Orbits: Negative. No traumatic or inflammatory finding. Sinuses: Clear. Soft tissues: Negative. Limited intracranial: Further evaluated on recent CT head. IMPRESSION: Unremarkable appearance of the face.  No acute findings. Electronically Signed   By: Feliberto Harts M.D.   On: 12/18/2022 19:41   VAS [place] TEMPORAL ARTERY BILATERAL  Result Date: 12/18/2022  TEMPORAL ARTERY REPORT Patient Name:  ERBIE MAROTTA [facility]  Date of Exam:   12/18/2022 Medical Rec #: [id number]             Accession #:    [id number] Date of Birth: 10/29/1945              Patient Gender: M Patient Age:   15 years Exam Location:  [facility] Procedure:      VAS [place] TEMPORAL ARTERY BILATERAL Referring Phys: MCNEILL KIRKPATRICK --------------------------------------------------------------------------------  Indications: Right temporal headache and pain behind right ear X 3 days. History              fusion of 5th and 6th vertebrae. HTN. HLD. History of CVA with              residual left hemiparesis. Chronic neck pain. High Risk Factors: Age > 50 yrs.  Comparison Study: No prior study Performing Technologist: Sherren Kerns RVS  Examination Guidelines: Patient in reclined position. 2D, color and spectral doppler sampling in the temporal artery along the hairline and temple in the longitudinal plane. 2D images along the hairline and temple in the transverse plane. Exam is bilateral.    Preliminary    DG Chest 2 View  Result Date: 12/17/2022 CLINICAL DATA:  Hyponatremia EXAM: CHEST - 2 VIEW COMPARISON:  Chest x-ray 12/03/2022 FINDINGS: The heart size and mediastinal contours are within normal limits. Both lungs are clear. The visualized skeletal structures are unremarkable. IMPRESSION: No  active cardiopulmonary disease. Electronically Signed   By: Darliss Cheney M.D.   On: 12/17/2022 21:37   CT Head Wo Contrast  Result Date: 12/17/2022 CLINICAL DATA:  New onset headaches.  Headaches for 3 days. EXAM: CT HEAD WITHOUT CONTRAST TECHNIQUE: Contiguous axial images were obtained from the base of the skull through the vertex without intravenous contrast. RADIATION DOSE REDUCTION: This exam was performed according to the departmental dose-optimization program which includes automated exposure control, adjustment of the mA and/or kV according to patient size and/or use of iterative reconstruction technique. COMPARISON:  01/30/2019 FINDINGS: Brain: Old lacunar infarct in the right basal ganglia. Old area of encephalomalacia in the right posterior frontal lobe, unchanged since prior study, consistent with old infarct in the distribution of the right middle cerebral artery. Diffuse cerebral atrophy. Ventricular dilatation consistent with central atrophy. Low-attenuation changes in the deep white matter consistent with small vessel ischemia. No abnormal extra-axial fluid collections. No mass effect or midline shift. Basal cisterns are not effaced. No acute intracranial hemorrhage. Vascular: No hyperdense vessel or unexpected calcification. Skull: Normal. Negative for fracture or focal lesion. Sinuses/Orbits: No acute finding. Other: None.  IMPRESSION: 1. Old right middle cerebral artery infarct and old right basal ganglia lacunar infarct, unchanged. 2. Chronic atrophy and small vessel ischemic changes. 3. No acute intracranial abnormalities. Electronically Signed   By: Burman Nieves M.D.   On: 12/17/2022 18:12   DG Chest Portable 1 View  Result Date: 12/03/2022 CLINICAL DATA:  Urinary hesitancy and frequency EXAM: PORTABLE CHEST 1 VIEW COMPARISON:  09/18/2022 FINDINGS: Single frontal view of the chest demonstrates an unremarkable cardiac silhouette. No airspace disease, effusion, or pneumothorax. Severe left shoulder osteoarthritis. No acute bony abnormalities. IMPRESSION: 1. No acute intrathoracic process. Electronically Signed   By: Sharlet Salina M.D.   On: 12/03/2022 17:43    Microbiology: Results for orders placed or performed during the hospital encounter of 12/17/22  SARS Coronavirus 2 by RT PCR (hospital order, performed in [facility] hospital lab) *cepheid single result test* Anterior Nasal Swab     Status: None   Collection Time: 12/17/22  9:08 PM   Specimen: Anterior Nasal Swab  Result Value Ref Range Status   SARS Coronavirus 2 by RT PCR NEGATIVE NEGATIVE Final    Comment: (NOTE) SARS-CoV-2 target nucleic acids are NOT DETECTED.  The SARS-CoV-2 RNA is generally detectable in upper and lower respiratory specimens during the acute phase of infection. The lowest concentration of SARS-CoV-2 viral copies this assay can detect is 250 copies / mL. A negative result does not preclude SARS-CoV-2 infection and should not be used as the sole basis for treatment or other patient management decisions.  A negative result may occur with improper specimen collection / handling, submission of specimen other than nasopharyngeal swab, presence of viral mutation(s) within the areas targeted by this assay, and inadequate number of viral copies (<250 copies / mL). A negative result must be combined with clinical observations,  patient history, and epidemiological information.  Fact Sheet for Patients:   RoadLapTop.co.za  Fact Sheet for Healthcare Providers: http://kim-miller.com/  This test is not yet approved or  cleared by the [place] FDA and has been authorized for detection and/or diagnosis of SARS-CoV-2 by FDA under an Emergency Use Authorization (EUA).  This EUA will remain in effect (meaning this test can be used) for the duration of the COVID-19 declaration under Section 564(b)(1) of the Act, 21 U.S.C. section 360bbb-3(b)(1), unless the authorization is terminated or revoked sooner.  Performed at [facility], [address] [place], [place] [address]     Labs: CBC: Recent Labs  Lab 12/17/22 1735 12/18/22 0436  WBC 8.0 5.0  NEUTROABS 5.5  --   HGB 12.1* 11.1*  HCT 36.7* 33.8*  MCV 94.8 96.6  PLT 388 358   Basic Metabolic Panel: Recent Labs  Lab 12/17/22 1735 12/18/22 0436 12/19/22 0452  NA 128* 130* 132*  K 4.2 4.2 3.9  CL 94* 99 99  CO2 22 22 23   GLUCOSE 107* 146* 148*  BUN 24* 20 28*  CREATININE 0.98 0.76 0.81  CALCIUM 8.9 8.4* 8.6*  MG 1.7 1.5* 1.9  PHOS 3.0 3.5  --    Liver Function Tests: Recent Labs  Lab 12/17/22 1735 12/18/22 0436  AST 39 36  ALT 82* 74*  ALKPHOS 86 68  BILITOT 0.5 0.2*  PROT 7.1 6.1*  ALBUMIN 3.0* 2.6*   CBG: Recent Labs  Lab 12/19/22 0623  GLUCAP 122*    Discharge time spent: greater than 30 minutes.  Signed: Meredeth Ide, MD [facility] 12/19/2022

## 2022-12-25 DIAGNOSIS — E2609 Other primary hyperaldosteronism: Secondary | ICD-10-CM | POA: Diagnosis not present

## 2022-12-25 DIAGNOSIS — M6284 Sarcopenia: Secondary | ICD-10-CM | POA: Diagnosis not present

## 2022-12-25 DIAGNOSIS — D649 Anemia, unspecified: Secondary | ICD-10-CM | POA: Diagnosis not present

## 2022-12-25 DIAGNOSIS — M6281 Muscle weakness (generalized): Secondary | ICD-10-CM | POA: Diagnosis not present

## 2022-12-25 DIAGNOSIS — I10 Essential (primary) hypertension: Secondary | ICD-10-CM | POA: Diagnosis not present

## 2022-12-25 DIAGNOSIS — Z832 Family history of diseases of the blood and blood-forming organs and certain disorders involving the immune mechanism: Secondary | ICD-10-CM | POA: Diagnosis not present

## 2022-12-25 DIAGNOSIS — E279 Disorder of adrenal gland, unspecified: Secondary | ICD-10-CM | POA: Diagnosis not present

## 2022-12-26 DIAGNOSIS — I2510 Atherosclerotic heart disease of native coronary artery without angina pectoris: Secondary | ICD-10-CM | POA: Diagnosis not present

## 2022-12-26 DIAGNOSIS — M75121 Complete rotator cuff tear or rupture of right shoulder, not specified as traumatic: Secondary | ICD-10-CM | POA: Diagnosis not present

## 2022-12-26 DIAGNOSIS — N1831 Chronic kidney disease, stage 3a: Secondary | ICD-10-CM | POA: Diagnosis not present

## 2022-12-26 DIAGNOSIS — I129 Hypertensive chronic kidney disease with stage 1 through stage 4 chronic kidney disease, or unspecified chronic kidney disease: Secondary | ICD-10-CM | POA: Diagnosis not present

## 2022-12-26 DIAGNOSIS — I714 Abdominal aortic aneurysm, without rupture, unspecified: Secondary | ICD-10-CM | POA: Diagnosis not present

## 2022-12-26 DIAGNOSIS — I739 Peripheral vascular disease, unspecified: Secondary | ICD-10-CM | POA: Diagnosis not present

## 2022-12-26 DIAGNOSIS — G8929 Other chronic pain: Secondary | ICD-10-CM | POA: Diagnosis not present

## 2022-12-26 DIAGNOSIS — M24531 Contracture, right wrist: Secondary | ICD-10-CM | POA: Diagnosis not present

## 2022-12-26 DIAGNOSIS — I69354 Hemiplegia and hemiparesis following cerebral infarction affecting left non-dominant side: Secondary | ICD-10-CM | POA: Diagnosis not present

## 2023-01-06 ENCOUNTER — Encounter: Payer: Self-pay | Admitting: Vascular Surgery

## 2023-01-06 ENCOUNTER — Ambulatory Visit (INDEPENDENT_AMBULATORY_CARE_PROVIDER_SITE_OTHER): Payer: Medicare HMO | Admitting: Vascular Surgery

## 2023-01-06 VITALS — BP 133/72 | HR 64 | Temp 98.1°F

## 2023-01-06 DIAGNOSIS — M316 Other giant cell arteritis: Secondary | ICD-10-CM

## 2023-01-06 NOTE — Progress Notes (Signed)
Patient name: Howard Jackson MRN: [id number] DOB: June 09, 1946 Sex: male  REASON FOR CONSULT: Evaluate for right temporal artery biopsy  HPI: Howard Jackson is a 77 y.o. male, with hx PAD, HTN, CAD, HLD, CHF, CVA with left hemiparesis that presents for evaluation of right temporal artery biopsy.  Patient was recently admitted and had a neurology consult on 12/18/2022 with new onset right-sided headaches and elevated inflammatory markers.  Vascular surgery has been asked for right temporal artery biopsy.  Patient has been on steroids and states no headaches today.  No right sided temporal tenderness.  Past Medical History:  Diagnosis Date   Adrenal gland disorder (HCC)    tumor present - no change-  no recent increase     Arthritis    Benign pheochromocytoma of right adrenal gland 06/23/2014   Never had histological diagnosis, but reportedly initially diagnosed in 1989   Bunion    left foot   Complication of anesthesia    "died on the table during cervical fusion" '89 - believed to be d/t "too much medication" to treat HTN and was later dx with pheochromocytoma   Coronary artery disease    cleared for surg. by Dr. Allyson Sabal - [facility]   H/O hiatal hernia    Hammer toe    Herniated lumbar intervertebral disc    History of anxiety    Hyperlipidemia    Hypertension    Neurogenic bladder    Neuromuscular disorder (HCC)    carpal tunnel problem, even after surgery release    Peripheral arterial disease (HCC)    Primary aldosteronism (HCC)    Stroke (HCC)    in [place]- 1999, L sided weakness, wears a brace on L leg & uses cane    Past Surgical History:  Procedure Laterality Date   ANTERIOR APPROACH HEMI HIP ARTHROPLASTY Left 04/15/2018   Procedure: ANTERIOR APPROACH HEMI HIP ARTHROPLASTY;  Surgeon: Samson Frederic, MD;  Location: [facility] OR;  Service: Orthopedics;  Laterality: Left;   CARPAL TUNNEL RELEASE     bilateral    CERVICAL FUSION     CORONARY ANGIOPLASTY WITH STENT PLACEMENT      pt reports having 5 stents in his heart-2011   HERNIA REPAIR     INGUINAL HERNIA REPAIR Left 11/18/2012   Procedure: HERNIA REPAIR INGUINAL ADULT;  Surgeon: Shelly Rubenstein, MD;  Location: [facility] OR;  Service: General;  Laterality: Left;   INSERTION OF MESH Left 11/18/2012   Procedure: INSERTION OF MESH;  Surgeon: Shelly Rubenstein, MD;  Location: [facility] OR;  Service: General;  Laterality: Left;    Family History  Problem Relation Age of Onset   CVA Father    Diabetes Father    Alzheimer's disease Father    Hypertension Sister    Hypertension Brother    CVA Paternal Grandmother    Hypertension Brother    Hypertension Mother     SOCIAL HISTORY: Social History   Socioeconomic History   Marital status: Legally Separated    Spouse name: Not on file   Number of children: Not on file   Years of education: Not on file   Highest education level: Not on file  Occupational History   Not on file  Tobacco Use   Smoking status: Never   Smokeless tobacco: Never  Vaping Use   Vaping Use: Never used  Substance and Sexual Activity   Alcohol use: Yes    Comment: social - one drink, on special occasion    Drug  use: No   Sexual activity: Not on file  Other Topics Concern   Not on file  Social History Narrative   Not on file   Social Determinants of Health   Financial Resource Strain: Not on file  Food Insecurity: No Food Insecurity (12/18/2022)   Hunger Vital Sign    Worried About Running Out of Food in the Last Year: Never true    Ran Out of Food in the Last Year: Never true  Transportation Needs: No Transportation Needs (12/18/2022)   PRAPARE - Administrator, Civil Service (Medical): No    Lack of Transportation (Non-Medical): No  Physical Activity: Not on file  Stress: Not on file  Social Connections: Not on file  Intimate Partner Violence: Not At Risk (12/18/2022)   Humiliation, Afraid, Rape, and Kick questionnaire    Fear of Current or Ex-Partner: No    Emotionally  Abused: No    Physically Abused: No    Sexually Abused: No    Allergies  Allergen Reactions   Amitriptyline Hcl Hypertension   Other Shortness Of Breath and Other (See Comments)    SKIN is sensitive- Guards for the underwear MUST be removed immediately after using  Anesthesia- "They almost could not get me back" (HAS TO TAKE SPIRONOLACTONE NOW)   Losartan Potassium Other (See Comments)    Hypotension   Papaya Derivatives Itching and Other (See Comments)    Itching of the throat   Sulfa Antibiotics Other (See Comments)    Constipation     Current Outpatient Medications  Medication Sig Dispense Refill   amoxicillin-clavulanate (AUGMENTIN) 875-125 MG tablet Take 1 tablet by mouth every 12 (twelve) hours. 4 tablet 0   aspirin 325 MG tablet Take 325 mg by mouth daily as needed for mild pain or headache.     Boswellia Serrata (BOSWELLIA PO) Take 1 capsule by mouth daily as needed (for pain).     Cholecalciferol (VITAMIN D3) 125 MCG (5000 UT) CAPS Take 5,000 Units by mouth every other day.     CITRUS BERGAMOT PO Take 1 capsule by mouth daily as needed (for pain).     clopidogrel (PLAVIX) 75 MG tablet TAKE 1 TABLET BY MOUTH EVERY DAY (Patient taking differently: Take 75 mg by mouth daily.) 90 tablet 3   Coenzyme Q10-Vitamin E (QUNOL ULTRA COQ10) 100-150 MG-UNIT CAPS Take 1 capsule by mouth daily.     finasteride (PROSCAR) 5 MG tablet Take 5 mg by mouth daily.     gabapentin (NEURONTIN) 100 MG capsule Take 100 mg by mouth daily as needed (for pain).     hydrocortisone (ANUSOL-HC) 2.5 % rectal cream Place 1 Application rectally 2 (two) times daily as needed for hemorrhoids (or pain).     labetalol (NORMODYNE) 300 MG tablet TAKE 1 TABLET BY MOUTH 2 TIMES DAILY. (Patient taking differently: Take 300 mg by mouth 2 (two) times daily.) 180 tablet 3   lactobacillus acidophilus (BACID) TABS tablet Take 2 tablets by mouth 2 (two) times daily. (Patient taking differently: Take 2 tablets by mouth  daily.)     magnesium hydroxide (MILK OF MAGNESIA) 400 MG/5ML suspension Take 15-30 mLs by mouth daily as needed for mild constipation.     NIFEdipine (ADALAT CC) 30 MG 24 hr tablet Take 30 mg by mouth in the morning.     NIFEdipine (ADALAT CC) 60 MG 24 hr tablet Take 1 tablet (60 mg total) by mouth every evening. 90 tablet 3   NON FORMULARY Take 1-2  capsules by mouth See admin instructions. Nitric Oxide Beet Root Capsules- Take 1-2 capsules by mouth once a day     NON FORMULARY Take 14 g by mouth See admin instructions. MCT Oil - Keto & Vegan MCTs C8, C10 from Coconuts- Mix 14 grams into a desired drink or food and consume by mouth once a day     NON FORMULARY Take 1 capsule by mouth See admin instructions. [place] Rhubarb - Herbal Supplement for Digestive Health - Natural Formula- Take 1 capsule by mouth once a day as needed to support digestive health     pantoprazole (PROTONIX) 40 MG tablet Take 1 tablet (40 mg total) by mouth daily. Take along with prednisone 60 tablet 1   predniSONE (DELTASONE) 20 MG tablet Take 3 tablets (60 mg total) by mouth daily with breakfast. 90 tablet 1   rosuvastatin (CRESTOR) 20 MG tablet TOME UNA TABLETA TODOS LOS DIAS (Patient taking differently: Take 20 mg by mouth daily.) 90 tablet 3   senna-docusate (SENOKOT-S) 8.6-50 MG tablet Take 3 tablets by mouth every evening. (Patient taking differently: Take 3 tablets by mouth daily as needed for mild constipation.)     spironolactone (ALDACTONE) 100 MG tablet Take 1 tablet (100 mg total) by mouth daily.     tamsulosin (FLOMAX) 0.4 MG CAPS capsule Take 1 capsule (0.4 mg total) by mouth daily. (Patient taking differently: Take 0.4 mg by mouth in the morning and at bedtime.) 30 capsule 3   tiZANidine (ZANAFLEX) 2 MG tablet Take 2 mg by mouth daily as needed for muscle spasms.     triamcinolone ointment (KENALOG) 0.1 % Apply 1 Application topically 2 (two) times daily as needed (for dryness).     No current  facility-administered medications for this visit.    REVIEW OF SYSTEMS:  [X]  denotes positive finding, [ ]  denotes negative finding Cardiac  Comments:  Chest pain or chest pressure:    Shortness of breath upon exertion:    Short of breath when lying flat:    Irregular heart rhythm:        Vascular    Pain in calf, thigh, or hip brought on by ambulation:    Pain in feet at night that wakes you up from your sleep:     Blood clot in your veins:    Leg swelling:         Pulmonary    Oxygen at home:    Productive cough:     Wheezing:         Neurologic    Sudden weakness in arms or legs:     Sudden numbness in arms or legs:     Sudden onset of difficulty speaking or slurred speech:    Temporary loss of vision in one eye:     Problems with dizziness:         Gastrointestinal    Blood in stool:     Vomited blood:         Genitourinary    Burning when urinating:     Blood in urine:        Psychiatric    Major depression:         Hematologic    Bleeding problems:    Problems with blood clotting too easily:        Skin    Rashes or ulcers:        Constitutional    Fever or chills:      PHYSICAL EXAM: Vitals:  01/06/23 1043  BP: 133/72  Pulse: 64  Temp: 98.1 F (36.7 C)  TempSrc: Temporal  SpO2: 97%    GENERAL: The patient is a well-nourished male, in no acute distress. The vital signs are documented above. CARDIAC: There is a regular rate and rhythm.  VASCULAR:  No right temporal tenderness Palpable right temporal artery biopsy PULMONARY: No respiratory distress. ABDOMEN: Soft and non-tender  MUSCULOSKELETAL: There are no major deformities or cyanosis. NEUROLOGIC: Sitting in wheelchair, left hemiparesis from old stroke SKIN: There are no ulcers or rashes noted. PSYCHIATRIC: The patient has a normal affect.  DATA:   N/A  Assessment/Plan:  77 y.o. male, with hx PAD, HTN, CAD, HLD, CHF, CVA with left hemiparesis that presents for evaluation of right  temporal artery biopsy.  Patient was recently admitted and had a neurology consult on 12/18/2022 with new onset right-sided headaches and elevated inflammatory markers.  Vascular surgery was asked to evaluate for right temporal artery biopsy.   I discussed the indication for temporal artery biopsy to establish the diagnosis of temporal arteritis.  I discussed the treatment of temporal arteritis is steroids.  I discussed temporal artery biopsy is the gold standard to establish diagnosis.  I offered to get this scheduled tomorrow.  Discussed this being done as an outpatient at [facility].  Risk and benefits discussed including small risk of injury to the temporal branch of the facial nerve.  I discussed that there are risks if he remains on steroids long-term if he does not have temporal arteritis.  He wants to think about it and he will call back to schedule at a later time if he decides to proceed.  States he is more concerned about his tooth extraction.   Cephus Shelling, MD [facility] Office: [phone]

## 2023-01-08 DIAGNOSIS — I69354 Hemiplegia and hemiparesis following cerebral infarction affecting left non-dominant side: Secondary | ICD-10-CM | POA: Diagnosis not present

## 2023-01-08 DIAGNOSIS — R519 Headache, unspecified: Secondary | ICD-10-CM | POA: Diagnosis not present

## 2023-01-09 DIAGNOSIS — I69354 Hemiplegia and hemiparesis following cerebral infarction affecting left non-dominant side: Secondary | ICD-10-CM | POA: Diagnosis not present

## 2023-01-09 DIAGNOSIS — I739 Peripheral vascular disease, unspecified: Secondary | ICD-10-CM | POA: Diagnosis not present

## 2023-01-09 DIAGNOSIS — M75121 Complete rotator cuff tear or rupture of right shoulder, not specified as traumatic: Secondary | ICD-10-CM | POA: Diagnosis not present

## 2023-01-09 DIAGNOSIS — I714 Abdominal aortic aneurysm, without rupture, unspecified: Secondary | ICD-10-CM | POA: Diagnosis not present

## 2023-01-09 DIAGNOSIS — G8929 Other chronic pain: Secondary | ICD-10-CM | POA: Diagnosis not present

## 2023-01-09 DIAGNOSIS — I2510 Atherosclerotic heart disease of native coronary artery without angina pectoris: Secondary | ICD-10-CM | POA: Diagnosis not present

## 2023-01-09 DIAGNOSIS — I129 Hypertensive chronic kidney disease with stage 1 through stage 4 chronic kidney disease, or unspecified chronic kidney disease: Secondary | ICD-10-CM | POA: Diagnosis not present

## 2023-01-09 DIAGNOSIS — N1831 Chronic kidney disease, stage 3a: Secondary | ICD-10-CM | POA: Diagnosis not present

## 2023-01-09 DIAGNOSIS — M24531 Contracture, right wrist: Secondary | ICD-10-CM | POA: Diagnosis not present

## 2023-01-12 DIAGNOSIS — I129 Hypertensive chronic kidney disease with stage 1 through stage 4 chronic kidney disease, or unspecified chronic kidney disease: Secondary | ICD-10-CM | POA: Diagnosis not present

## 2023-01-12 DIAGNOSIS — M75121 Complete rotator cuff tear or rupture of right shoulder, not specified as traumatic: Secondary | ICD-10-CM | POA: Diagnosis not present

## 2023-01-12 DIAGNOSIS — I739 Peripheral vascular disease, unspecified: Secondary | ICD-10-CM | POA: Diagnosis not present

## 2023-01-12 DIAGNOSIS — I2510 Atherosclerotic heart disease of native coronary artery without angina pectoris: Secondary | ICD-10-CM | POA: Diagnosis not present

## 2023-01-12 DIAGNOSIS — I714 Abdominal aortic aneurysm, without rupture, unspecified: Secondary | ICD-10-CM | POA: Diagnosis not present

## 2023-01-12 DIAGNOSIS — N1831 Chronic kidney disease, stage 3a: Secondary | ICD-10-CM | POA: Diagnosis not present

## 2023-01-12 DIAGNOSIS — I69354 Hemiplegia and hemiparesis following cerebral infarction affecting left non-dominant side: Secondary | ICD-10-CM | POA: Diagnosis not present

## 2023-01-12 DIAGNOSIS — M24531 Contracture, right wrist: Secondary | ICD-10-CM | POA: Diagnosis not present

## 2023-01-12 DIAGNOSIS — G8929 Other chronic pain: Secondary | ICD-10-CM | POA: Diagnosis not present

## 2023-01-29 LAB — LAB REPORT - SCANNED: EGFR: 65

## 2023-02-12 ENCOUNTER — Emergency Department (HOSPITAL_COMMUNITY): Payer: Medicare (Managed Care)

## 2023-02-12 ENCOUNTER — Other Ambulatory Visit: Payer: Self-pay

## 2023-02-12 ENCOUNTER — Encounter (HOSPITAL_COMMUNITY): Payer: Self-pay | Admitting: Emergency Medicine

## 2023-02-12 ENCOUNTER — Inpatient Hospital Stay (HOSPITAL_COMMUNITY)
Admission: EM | Admit: 2023-02-12 | Discharge: 2023-02-23 | DRG: 516 | Disposition: A | Discharge status: Skilled Nursing Facility | Payer: Medicare (Managed Care) | Attending: Internal Medicine | Admitting: Internal Medicine

## 2023-02-12 DIAGNOSIS — I6930 Unspecified sequelae of cerebral infarction: Secondary | ICD-10-CM

## 2023-02-12 DIAGNOSIS — Z96642 Presence of left artificial hip joint: Secondary | ICD-10-CM | POA: Diagnosis present

## 2023-02-12 DIAGNOSIS — R059 Cough, unspecified: Secondary | ICD-10-CM | POA: Diagnosis present

## 2023-02-12 DIAGNOSIS — Z8249 Family history of ischemic heart disease and other diseases of the circulatory system: Secondary | ICD-10-CM | POA: Diagnosis not present

## 2023-02-12 DIAGNOSIS — E440 Moderate protein-calorie malnutrition: Secondary | ICD-10-CM | POA: Diagnosis present

## 2023-02-12 DIAGNOSIS — I739 Peripheral vascular disease, unspecified: Secondary | ICD-10-CM | POA: Diagnosis present

## 2023-02-12 DIAGNOSIS — K219 Gastro-esophageal reflux disease without esophagitis: Secondary | ICD-10-CM | POA: Diagnosis present

## 2023-02-12 DIAGNOSIS — Y92239 Unspecified place in hospital as the place of occurrence of the external cause: Secondary | ICD-10-CM | POA: Diagnosis not present

## 2023-02-12 DIAGNOSIS — Z79899 Other long term (current) drug therapy: Secondary | ICD-10-CM

## 2023-02-12 DIAGNOSIS — E7800 Pure hypercholesterolemia, unspecified: Secondary | ICD-10-CM | POA: Diagnosis present

## 2023-02-12 DIAGNOSIS — I2510 Atherosclerotic heart disease of native coronary artery without angina pectoris: Secondary | ICD-10-CM | POA: Diagnosis present

## 2023-02-12 DIAGNOSIS — Z8673 Personal history of transient ischemic attack (TIA), and cerebral infarction without residual deficits: Secondary | ICD-10-CM

## 2023-02-12 DIAGNOSIS — Z993 Dependence on wheelchair: Secondary | ICD-10-CM | POA: Diagnosis not present

## 2023-02-12 DIAGNOSIS — E2609 Other primary hyperaldosteronism: Secondary | ICD-10-CM | POA: Diagnosis present

## 2023-02-12 DIAGNOSIS — R413 Other amnesia: Secondary | ICD-10-CM | POA: Diagnosis present

## 2023-02-12 DIAGNOSIS — K5900 Constipation, unspecified: Secondary | ICD-10-CM | POA: Diagnosis not present

## 2023-02-12 DIAGNOSIS — W1830XA Fall on same level, unspecified, initial encounter: Secondary | ICD-10-CM | POA: Diagnosis present

## 2023-02-12 DIAGNOSIS — M4316 Spondylolisthesis, lumbar region: Secondary | ICD-10-CM | POA: Diagnosis present

## 2023-02-12 DIAGNOSIS — S32000A Wedge compression fracture of unspecified lumbar vertebra, initial encounter for closed fracture: Secondary | ICD-10-CM | POA: Diagnosis present

## 2023-02-12 DIAGNOSIS — I5032 Chronic diastolic (congestive) heart failure: Secondary | ICD-10-CM | POA: Diagnosis present

## 2023-02-12 DIAGNOSIS — D72829 Elevated white blood cell count, unspecified: Secondary | ICD-10-CM | POA: Diagnosis not present

## 2023-02-12 DIAGNOSIS — R519 Headache, unspecified: Secondary | ICD-10-CM | POA: Diagnosis present

## 2023-02-12 DIAGNOSIS — E785 Hyperlipidemia, unspecified: Secondary | ICD-10-CM | POA: Diagnosis present

## 2023-02-12 DIAGNOSIS — I110 Hypertensive heart disease with heart failure: Secondary | ICD-10-CM | POA: Diagnosis present

## 2023-02-12 DIAGNOSIS — S32039A Unspecified fracture of third lumbar vertebra, initial encounter for closed fracture: Principal | ICD-10-CM | POA: Diagnosis present

## 2023-02-12 DIAGNOSIS — Z6820 Body mass index (BMI) 20.0-20.9, adult: Secondary | ICD-10-CM | POA: Diagnosis not present

## 2023-02-12 DIAGNOSIS — T380X5A Adverse effect of glucocorticoids and synthetic analogues, initial encounter: Secondary | ICD-10-CM | POA: Diagnosis not present

## 2023-02-12 DIAGNOSIS — Z955 Presence of coronary angioplasty implant and graft: Secondary | ICD-10-CM

## 2023-02-12 DIAGNOSIS — D696 Thrombocytopenia, unspecified: Secondary | ICD-10-CM | POA: Diagnosis not present

## 2023-02-12 DIAGNOSIS — Z5941 Food insecurity: Secondary | ICD-10-CM

## 2023-02-12 DIAGNOSIS — R296 Repeated falls: Secondary | ICD-10-CM | POA: Diagnosis present

## 2023-02-12 DIAGNOSIS — Z833 Family history of diabetes mellitus: Secondary | ICD-10-CM

## 2023-02-12 DIAGNOSIS — F419 Anxiety disorder, unspecified: Secondary | ICD-10-CM | POA: Diagnosis present

## 2023-02-12 DIAGNOSIS — S32030A Wedge compression fracture of third lumbar vertebra, initial encounter for closed fracture: Secondary | ICD-10-CM | POA: Diagnosis not present

## 2023-02-12 DIAGNOSIS — E875 Hyperkalemia: Secondary | ICD-10-CM | POA: Diagnosis present

## 2023-02-12 DIAGNOSIS — Z981 Arthrodesis status: Secondary | ICD-10-CM

## 2023-02-12 DIAGNOSIS — I69354 Hemiplegia and hemiparesis following cerebral infarction affecting left non-dominant side: Secondary | ICD-10-CM

## 2023-02-12 DIAGNOSIS — M419 Scoliosis, unspecified: Secondary | ICD-10-CM | POA: Diagnosis present

## 2023-02-12 DIAGNOSIS — E871 Hypo-osmolality and hyponatremia: Secondary | ICD-10-CM | POA: Diagnosis not present

## 2023-02-12 DIAGNOSIS — I10 Essential (primary) hypertension: Secondary | ICD-10-CM | POA: Diagnosis present

## 2023-02-12 DIAGNOSIS — Z7902 Long term (current) use of antithrombotics/antiplatelets: Secondary | ICD-10-CM

## 2023-02-12 DIAGNOSIS — Z820 Family history of epilepsy and other diseases of the nervous system: Secondary | ICD-10-CM

## 2023-02-12 DIAGNOSIS — E222 Syndrome of inappropriate secretion of antidiuretic hormone: Secondary | ICD-10-CM | POA: Diagnosis present

## 2023-02-12 DIAGNOSIS — N400 Enlarged prostate without lower urinary tract symptoms: Secondary | ICD-10-CM | POA: Diagnosis present

## 2023-02-12 DIAGNOSIS — M48061 Spinal stenosis, lumbar region without neurogenic claudication: Secondary | ICD-10-CM | POA: Diagnosis present

## 2023-02-12 DIAGNOSIS — W19XXXA Unspecified fall, initial encounter: Principal | ICD-10-CM

## 2023-02-12 DIAGNOSIS — Z823 Family history of stroke: Secondary | ICD-10-CM

## 2023-02-12 DIAGNOSIS — Z7982 Long term (current) use of aspirin: Secondary | ICD-10-CM

## 2023-02-12 LAB — URINALYSIS, ROUTINE W REFLEX MICROSCOPIC
Bilirubin Urine: NEGATIVE
Glucose, UA: 50 mg/dL — AB
Hgb urine dipstick: NEGATIVE
Ketones, ur: NEGATIVE mg/dL
Leukocytes,Ua: NEGATIVE
Nitrite: NEGATIVE
Protein, ur: NEGATIVE mg/dL
Specific Gravity, Urine: 1.013 (ref 1.005–1.030)
pH: 6 (ref 5.0–8.0)

## 2023-02-12 LAB — CBC
HCT: 48.1 % (ref 39.0–52.0)
HCT: 45 % (ref 39.0–52.0)
Hemoglobin: 16.1 g/dL (ref 13.0–17.0)
Hemoglobin: 14.8 g/dL (ref 13.0–17.0)
MCH: 31.9 pg (ref 26.0–34.0)
MCH: 31.9 pg (ref 26.0–34.0)
MCHC: 33.5 g/dL (ref 30.0–36.0)
MCHC: 32.9 g/dL (ref 30.0–36.0)
MCV: 95.2 fL (ref 80.0–100.0)
MCV: 97 fL (ref 80.0–100.0)
Platelets: 140 10*3/uL — ABNORMAL LOW (ref 150–400)
Platelets: 128 10*3/uL — ABNORMAL LOW (ref 150–400)
RBC: 5.05 MIL/uL (ref 4.22–5.81)
RBC: 4.64 MIL/uL (ref 4.22–5.81)
RDW: 15.6 % — ABNORMAL HIGH (ref 11.5–15.5)
RDW: 15.8 % — ABNORMAL HIGH (ref 11.5–15.5)
WBC: 13.1 10*3/uL — ABNORMAL HIGH (ref 4.0–10.5)
WBC: 11.7 10*3/uL — ABNORMAL HIGH (ref 4.0–10.5)
nRBC: 0 % (ref 0.0–0.2)
nRBC: 0 % (ref 0.0–0.2)

## 2023-02-12 LAB — BASIC METABOLIC PANEL
Anion gap: 9 (ref 5–15)
BUN: 33 mg/dL — ABNORMAL HIGH (ref 8–23)
CO2: 22 mmol/L (ref 22–32)
Calcium: 8.4 mg/dL — ABNORMAL LOW (ref 8.9–10.3)
Chloride: 94 mmol/L — ABNORMAL LOW (ref 98–111)
Creatinine, Ser: 0.74 mg/dL (ref 0.61–1.24)
GFR, Estimated: >60 mL/min (ref >60)
Glucose, Bld: 160 mg/dL — ABNORMAL HIGH (ref 70–99)
Potassium: 4.6 mmol/L (ref 3.5–5.1)
Sodium: 125 mmol/L — ABNORMAL LOW (ref 135–145)

## 2023-02-12 LAB — CREATININE, SERUM
Creatinine, Ser: 0.8 mg/dL (ref 0.61–1.24)
GFR, Estimated: >60 mL/min (ref >60)

## 2023-02-12 MED ORDER — TAMSULOSIN HCL 0.4 MG PO CAPS
0.4000 mg | ORAL_CAPSULE | Freq: Every day | ORAL | Status: DC
  Administered 2023-02-13 – 2023-02-15 (×3): 0.4 mg via ORAL
  Filled 2023-02-12 (×3): qty 1

## 2023-02-12 MED ORDER — OXYCODONE-ACETAMINOPHEN 5-325 MG PO TABS: 1.0000 | ORAL_TABLET | Freq: Three times a day (TID) | ORAL | 0 refills | Status: DC | PRN

## 2023-02-12 MED ORDER — LABETALOL HCL 100 MG PO TABS
300.0000 mg | ORAL_TABLET | Freq: Two times a day (BID) | ORAL | Status: DC
  Administered 2023-02-12: 100 mg via ORAL
  Administered 2023-02-13 – 2023-02-17 (×10): 300 mg via ORAL
  Filled 2023-02-12 (×11): qty 3

## 2023-02-12 MED ORDER — ONDANSETRON HCL 4 MG/2ML IJ SOLN: 4.0000 mg | Freq: Four times a day (QID) | INTRAMUSCULAR | Status: DC | PRN

## 2023-02-12 MED ORDER — VITAMIN D 25 MCG (1000 UNIT) PO TABS
5000.0000 [IU] | ORAL_TABLET | ORAL | Status: DC
  Administered 2023-02-14 – 2023-02-22 (×4): 5000 [IU] via ORAL
  Filled 2023-02-12 (×4): qty 5

## 2023-02-12 MED ORDER — ENSURE ENLIVE PO LIQD
237.0000 mL | Freq: Two times a day (BID) | ORAL | Status: DC
  Administered 2023-02-16 (×2): 237 mL via ORAL

## 2023-02-12 MED ORDER — SODIUM CHLORIDE 0.9% FLUSH
3.0000 mL | Freq: Two times a day (BID) | INTRAVENOUS | Status: DC
  Administered 2023-02-12 – 2023-02-23 (×20): 3 mL via INTRAVENOUS

## 2023-02-12 MED ORDER — SENNOSIDES-DOCUSATE SODIUM 8.6-50 MG PO TABS
1.0000 | ORAL_TABLET | Freq: Every evening | ORAL | Status: DC | PRN
  Administered 2023-02-20: 1 via ORAL
  Filled 2023-02-12 (×2): qty 1

## 2023-02-12 MED ORDER — HYDROCODONE-ACETAMINOPHEN 5-325 MG PO TABS
1.0000 | ORAL_TABLET | ORAL | Status: DC | PRN
  Administered 2023-02-13: 2 via ORAL
  Filled 2023-02-12 (×5): qty 2
  Filled 2023-02-12: qty 1

## 2023-02-12 MED ORDER — MORPHINE SULFATE (PF) 2 MG/ML IV SOLN: 1.0000 mg | Freq: Four times a day (QID) | INTRAVENOUS | Status: DC | PRN

## 2023-02-12 MED ORDER — FINASTERIDE 5 MG PO TABS
5.0000 mg | ORAL_TABLET | Freq: Every day | ORAL | Status: DC
  Administered 2023-02-13 – 2023-02-23 (×11): 5 mg via ORAL
  Filled 2023-02-12 (×11): qty 1

## 2023-02-12 MED ORDER — SPIRONOLACTONE 25 MG PO TABS
100.0000 mg | ORAL_TABLET | Freq: Every day | ORAL | Status: DC
  Administered 2023-02-13 – 2023-02-20 (×8): 100 mg via ORAL
  Filled 2023-02-12 (×7): qty 4

## 2023-02-12 MED ORDER — HYDRALAZINE HCL 20 MG/ML IJ SOLN: 5.0000 mg | Freq: Four times a day (QID) | INTRAMUSCULAR | Status: DC | PRN

## 2023-02-12 MED ORDER — OXYCODONE-ACETAMINOPHEN 5-325 MG PO TABS
1.0000 | ORAL_TABLET | Freq: Once | ORAL | Status: AC
  Administered 2023-02-12: 1 via ORAL
  Filled 2023-02-12: qty 1

## 2023-02-12 MED ORDER — ENOXAPARIN SODIUM 40 MG/0.4ML IJ SOSY
40.0000 mg | PREFILLED_SYRINGE | INTRAMUSCULAR | Status: DC
  Administered 2023-02-14 – 2023-02-15 (×2): 40 mg via SUBCUTANEOUS
  Filled 2023-02-12 (×6): qty 0.4

## 2023-02-12 MED ORDER — MAGNESIUM HYDROXIDE 400 MG/5ML PO SUSP: 15.0000 mL | Freq: Every day | ORAL | Status: DC | PRN

## 2023-02-12 MED ORDER — BISACODYL 5 MG PO TBEC
5.0000 mg | DELAYED_RELEASE_TABLET | Freq: Every day | ORAL | Status: DC | PRN
  Administered 2023-02-20 – 2023-02-22 (×3): 5 mg via ORAL
  Filled 2023-02-12 (×3): qty 1

## 2023-02-12 MED ORDER — NIFEDIPINE ER OSMOTIC RELEASE 60 MG PO TB24
60.0000 mg | ORAL_TABLET | Freq: Every evening | ORAL | Status: DC
  Administered 2023-02-13 – 2023-02-22 (×10): 60 mg via ORAL
  Filled 2023-02-12 (×10): qty 1

## 2023-02-12 MED ORDER — SODIUM CHLORIDE 0.9 % IV BOLUS
1000.0000 mL | Freq: Once | INTRAVENOUS | Status: AC
  Administered 2023-02-12: 1000 mL via INTRAVENOUS

## 2023-02-12 MED ORDER — CLOPIDOGREL BISULFATE 75 MG PO TABS
75.0000 mg | ORAL_TABLET | Freq: Every day | ORAL | Status: DC
  Administered 2023-02-13 – 2023-02-14 (×2): 75 mg via ORAL
  Filled 2023-02-12 (×2): qty 1

## 2023-02-12 MED ORDER — NIFEDIPINE ER OSMOTIC RELEASE 30 MG PO TB24
30.0000 mg | ORAL_TABLET | Freq: Every day | ORAL | Status: DC
  Administered 2023-02-13 – 2023-02-23 (×11): 30 mg via ORAL
  Filled 2023-02-12 (×12): qty 1

## 2023-02-12 MED ORDER — ROSUVASTATIN CALCIUM 20 MG PO TABS
20.0000 mg | ORAL_TABLET | Freq: Every day | ORAL | Status: DC
  Administered 2023-02-13 – 2023-02-23 (×11): 20 mg via ORAL
  Filled 2023-02-12 (×11): qty 1

## 2023-02-12 MED ORDER — SENNOSIDES-DOCUSATE SODIUM 8.6-50 MG PO TABS: 1.0000 | ORAL_TABLET | Freq: Every day | ORAL | 0 refills | Status: AC | PRN

## 2023-02-12 MED ORDER — PREDNISONE 20 MG PO TABS
60.0000 mg | ORAL_TABLET | Freq: Every day | ORAL | Status: DC
  Administered 2023-02-13 – 2023-02-23 (×11): 60 mg via ORAL
  Filled 2023-02-12 (×11): qty 3

## 2023-02-12 MED ORDER — BACID PO TABS: 2.0000 | ORAL_TABLET | Freq: Every day | ORAL | Status: DC

## 2023-02-12 MED ORDER — ACETAMINOPHEN 650 MG RE SUPP: 650.0000 mg | Freq: Four times a day (QID) | RECTAL | Status: DC | PRN

## 2023-02-12 MED ORDER — TIZANIDINE HCL 4 MG PO TABS
2.0000 mg | ORAL_TABLET | Freq: Every day | ORAL | Status: DC | PRN
  Administered 2023-02-12: 2 mg via ORAL
  Filled 2023-02-12 (×3): qty 1

## 2023-02-12 MED ORDER — PANTOPRAZOLE SODIUM 40 MG PO TBEC
40.0000 mg | DELAYED_RELEASE_TABLET | Freq: Every day | ORAL | Status: DC
  Administered 2023-02-13 – 2023-02-23 (×11): 40 mg via ORAL
  Filled 2023-02-12 (×11): qty 1

## 2023-02-12 MED ORDER — ONDANSETRON HCL 4 MG PO TABS: 4.0000 mg | ORAL_TABLET | Freq: Four times a day (QID) | ORAL | Status: DC | PRN

## 2023-02-12 MED ORDER — RISAQUAD PO CAPS
2.0000 | ORAL_CAPSULE | Freq: Every day | ORAL | Status: DC
  Administered 2023-02-13 – 2023-02-23 (×11): 2 via ORAL
  Filled 2023-02-12 (×11): qty 2

## 2023-02-12 MED ORDER — TRAZODONE HCL 50 MG PO TABS
25.0000 mg | ORAL_TABLET | Freq: Every evening | ORAL | Status: DC | PRN
  Filled 2023-02-12 (×3): qty 1

## 2023-02-12 MED ORDER — ACETAMINOPHEN 325 MG PO TABS
650.0000 mg | ORAL_TABLET | Freq: Four times a day (QID) | ORAL | Status: DC | PRN
  Administered 2023-02-12 – 2023-02-22 (×13): 650 mg via ORAL
  Filled 2023-02-12 (×14): qty 2

## 2023-02-12 NOTE — ED Notes (Signed)
ED TO INPATIENT HANDOFF REPORT  ED Nurse Name and Phone #: Thamas Jaegers Name/Age/Gender Howard Jackson 77 y.o. male Room/Bed: WHALA/WHALA  Code Status   Code Status: Full Code  Home/SNF/Other Home Patient oriented to: self, place, time, and situation Is this baseline? Yes   Triage Complete: Triage complete  Chief Complaint Lumbar compression fracture (HCC) [S32.000A]  Triage Note Pt to ER via EMS from home with c/o slipped and fell onto buttocks this AM around 5AM.  Pt has hx of spinal fusion and chronic pain. Pt did not hit head, denies LOC.  Pt was unable to get himself up from floor.   Allergies Allergies  Allergen Reactions   Amitriptyline Hcl Hypertension   Other Shortness Of Breath and Other (See Comments)    SKIN is sensitive- Guards for the underwear MUST be removed immediately after using  Anesthesia- "They almost could not get me back" (HAS TO TAKE SPIRONOLACTONE NOW)   Losartan Potassium Other (See Comments)    Hypotension   Papaya Derivatives Itching and Other (See Comments)    Itching of the throat   Sulfa Antibiotics Other (See Comments)    Constipation     Level of Care/Admitting Diagnosis ED Disposition     ED Disposition  Admit   Condition  --   Comment  Hospital Area: [facility] [[id number]]  Level of Care: Med-Surg [16]  May admit patient to [facility] or [facility] if equivalent level of care is available:: No  Covid Evaluation: Asymptomatic - no recent exposure (last 10 days) testing not required  Diagnosis: Lumbar compression fracture [facility]) [[id number]]  Admitting Physician: Tonye Royalty [[id number]]  Attending Physician: Janann Colonel  Certification:: I certify this patient will need inpatient services for at least 2 midnights  Estimated Length of Stay: 3          B Medical/Surgery History Past Medical History:  Diagnosis Date   Adrenal gland disorder (HCC)    tumor present - no change-  no  recent increase     Arthritis    Benign pheochromocytoma of right adrenal gland 06/23/2014   Never had histological diagnosis, but reportedly initially diagnosed in 1989   Bunion    left foot   Complication of anesthesia    "died on the table during cervical fusion" '89 - believed to be d/t "too much medication" to treat HTN and was later dx with pheochromocytoma   Coronary artery disease    cleared for surg. by Dr. Allyson Sabal - [facility]   H/O hiatal hernia    Hammer toe    Herniated lumbar intervertebral disc    History of anxiety    Hyperlipidemia    Hypertension    Neurogenic bladder    Neuromuscular disorder (HCC)    carpal tunnel problem, even after surgery release    Peripheral arterial disease (HCC)    Primary aldosteronism (HCC)    Stroke (HCC)    in [place]- 1999, L sided weakness, wears a brace on L leg & uses cane   Past Surgical History:  Procedure Laterality Date   ANTERIOR APPROACH HEMI HIP ARTHROPLASTY Left 04/15/2018   Procedure: ANTERIOR APPROACH HEMI HIP ARTHROPLASTY;  Surgeon: Samson Frederic, MD;  Location: [facility] OR;  Service: Orthopedics;  Laterality: Left;   CARPAL TUNNEL RELEASE     bilateral    CERVICAL FUSION     CORONARY ANGIOPLASTY WITH STENT PLACEMENT     pt reports having 5 stents in his heart-2011  HERNIA REPAIR     INGUINAL HERNIA REPAIR Left 11/18/2012   Procedure: HERNIA REPAIR INGUINAL ADULT;  Surgeon: Shelly Rubenstein, MD;  Location: [facility] OR;  Service: General;  Laterality: Left;   INSERTION OF MESH Left 11/18/2012   Procedure: INSERTION OF MESH;  Surgeon: Shelly Rubenstein, MD;  Location: [facility] OR;  Service: General;  Laterality: Left;     A IV Location/Drains/Wounds Patient Lines/Drains/Airways Status     Active Line/Drains/Airways     Name Placement date Placement time Site Days   Peripheral IV 02/12/23 20 G Anterior;Right Forearm 02/12/23  1418  Forearm  less than 1   External Urinary Catheter 12/18/22  0300  --  56            Intake/Output Last  24 hours No intake or output data in the 24 hours ending 02/12/23 1733  Labs/Imaging Results for orders placed or performed during the hospital encounter of 02/12/23 (from the past 48 hour(s))  Basic metabolic panel     Status: Abnormal   Collection Time: 02/12/23  2:16 PM  Result Value Ref Range   Sodium 125 (L) 135 - 145 mmol/L   Potassium 4.6 3.5 - 5.1 mmol/L   Chloride 94 (L) 98 - 111 mmol/L   CO2 22 22 - 32 mmol/L   Glucose, Bld 160 (H) 70 - 99 mg/dL    Comment: Glucose reference range applies only to samples taken after fasting for at least 8 hours.   BUN 33 (H) 8 - 23 mg/dL   Creatinine, Ser 8.41 0.61 - 1.24 mg/dL   Calcium 8.4 (L) 8.9 - 10.3 mg/dL   GFR, Estimated >32 >44 mL/min    Comment: (NOTE) Calculated using the CKD-EPI Creatinine Equation (2021)    Anion gap 9 5 - 15    Comment: Performed at [facility], [address] [place], [place] [address]  CBC     Status: Abnormal   Collection Time: 02/12/23  2:16 PM  Result Value Ref Range   WBC 13.1 (H) 4.0 - 10.5 K/uL   RBC 5.05 4.22 - 5.81 MIL/uL   Hemoglobin 16.1 13.0 - 17.0 g/dL   HCT 25.3 66.4 - 40.3 %   MCV 95.2 80.0 - 100.0 fL   MCH 31.9 26.0 - 34.0 pg   MCHC 33.5 30.0 - 36.0 g/dL   RDW 47.4 (H) 25.9 - 56.3 %   Platelets 140 (L) 150 - 400 K/uL   nRBC 0.0 0.0 - 0.2 %    Comment: Performed at [facility], [address], [place], [place] [address]   CT Lumbar Spine Wo Contrast  Result Date: 02/12/2023 CLINICAL DATA:  Back trauma, no prior imaging (Age >= 16y) EXAM: CT LUMBAR SPINE WITHOUT CONTRAST TECHNIQUE: Multidetector CT imaging of the lumbar spine was performed without intravenous contrast administration. Multiplanar CT image reconstructions were also generated. RADIATION DOSE REDUCTION: This exam was performed according to the departmental dose-optimization program which includes automated exposure control, adjustment of the mA and/or kV according to patient size and/or  use of iterative reconstruction technique. COMPARISON:  None Available. FINDINGS: Segmentation: 5 lumbar type vertebrae. Alignment: Normal. Vertebrae: There is a very mild compression deformity of the anterior superior endplate of L3 (series 5, image 13). No focal pathologic process. Paraspinal and other soft tissues: Aortic atherosclerotic calcifications. Disc levels: Mild spinal canal narrowing at L3-L4 and moderate spinal canal narrowing at L4-L5. Moderate bilateral neural foraminal narrowing at L4-L5. Severe right-sided neural foraminal narrowing  at L5-S1. IMPRESSION: 1. Very mild acute superior endplate compression deformity at the anterior aspect of L3, age indeterminate. 2. Moderate spinal canal narrowing at L4-L5 3. Severe right-sided neural foraminal narrowing at L5-S1. Aortic Atherosclerosis (ICD10-I70.0). Electronically Signed   By: Lorenza Cambridge M.D.   On: 02/12/2023 11:05   DG Hips Bilat W or Wo Pelvis 3-4 Views  Result Date: 02/12/2023 CLINICAL DATA:  Trauma, fall, pain EXAM: DG HIP (WITH OR WITHOUT PELVIS) 3-4V BILAT COMPARISON:  08/02/2019 FINDINGS: There is previous left hip arthroplasty. No recent fracture or dislocation is seen. Arterial calcifications are noted. There are tiny metallic densities in the soft tissues adjacent to both hips, possibly residual from previous injury. Dextroscoliosis is seen in the lumbar spine. Degenerative changes are noted in lumbar spine with bony spurs, disc space narrowing and facet hypertrophy. IMPRESSION: No recent fracture or dislocation is seen. Previous left hip arthroplasty. Arteriosclerosis.  Lumbar spondylosis. Electronically Signed   By: Ernie Avena M.D.   On: 02/12/2023 09:33    Pending Labs [employer] (From admission, onward)     Start     Ordered   Signed and Held  CBC  (enoxaparin (LOVENOX)    CrCl >/= 30 ml/min)  Once,   R       Comments: Baseline for enoxaparin therapy IF NOT ALREADY DRAWN.  Notify MD if PLT < 100 K.     Signed and Held   Signed and Held  Creatinine, serum  (enoxaparin (LOVENOX)    CrCl >/= 30 ml/min)  Once,   R       Comments: Baseline for enoxaparin therapy IF NOT ALREADY DRAWN.    Signed and Held   Signed and Held  Creatinine, serum  (enoxaparin (LOVENOX)    CrCl >/= 30 ml/min)  Weekly,   R     Comments: while on enoxaparin therapy    Signed and Held   Signed and Held  Comprehensive metabolic panel  Tomorrow morning,   R        Signed and Held   Signed and Held  CBC  Tomorrow morning,   R        Signed and Held   Signed and Held  Urinalysis, Routine w reflex microscopic -Urine, Clean Catch  Once,   R       Question:  Specimen Source  Answer:  Urine, Clean Catch   Signed and Held            Vitals/Pain Today's Vitals   02/12/23 1000 02/12/23 1030 02/12/23 1519 02/12/23 1519  BP: (!) 100/54 (!) 107/55  (!) 115/57  Pulse: 71 61  (!) 59  Resp:  18  18  Temp:    97.8 F (36.6 C)  TempSrc:    Oral  SpO2: 95% 94%  96%  Weight:      Height:      PainSc:   3      Isolation Precautions No active isolations  Medications Medications  oxyCODONE-acetaminophen (PERCOCET/ROXICET) 5-325 MG per tablet 1 tablet (1 tablet Oral Given 02/12/23 0903)  sodium chloride 0.9 % bolus 1,000 mL (1,000 mLs Intravenous New Bag/Given 02/12/23 1519)    Mobility walks with device     Focused Assessments     R Recommendations: See Admitting Provider Note  Report given to:   Additional Notes:

## 2023-02-12 NOTE — ED Triage Notes (Signed)
Pt to ER via EMS from home with c/o slipped and fell onto buttocks this AM around 5AM.  Pt has hx of spinal fusion and chronic pain. Pt did not hit head, denies LOC.  Pt was unable to get himself up from floor.

## 2023-02-12 NOTE — Discharge Instructions (Addendum)
You have an L3 spinal fracture from your fall today.  This will need follow-up with the spinal clinic.  Please call to make a follow-up appointment with the specialist.

## 2023-02-12 NOTE — ED Provider Notes (Addendum)
[facility] AT [facility] Provider Note   CSN: [id number] Arrival date & time: 02/12/23  0830     History  Chief Complaint  Patient presents with   Howard Jackson is a 77 y.o. male presenting with mechanical fall this morning, landed on tailbone, around 0500.  Couldn't stand up afterwards, called EMS.  PT reporting low back pain, says he has a "bad back."  On steroids currently long-term for concern for temporal arteritis last month, per my review of the medical records.   Denies radiculopathy into his feet, or incontinence issues.  HPI     Home Medications Prior to Admission medications   Medication Sig Start Date End Date Taking? Authorizing Provider  oxyCODONE-acetaminophen (PERCOCET/ROXICET) 5-325 MG tablet Take 1 tablet by mouth every 8 (eight) hours as needed for up to 15 doses for severe pain. 02/12/23  Yes Terald Sleeper, MD  senna-docusate (SENOKOT-S) 8.6-50 MG tablet Take 1 tablet by mouth daily as needed for up to 15 days for mild constipation (To prevent constipation from percocet narcotic). 02/12/23 02/27/23 Yes , Kermit Balo, MD  amoxicillin-clavulanate (AUGMENTIN) 875-125 MG tablet Take 1 tablet by mouth every 12 (twelve) hours. 12/19/22   Meredeth Ide, MD  aspirin 325 MG tablet Take 325 mg by mouth daily as needed for mild pain or headache.    [provider]  Boswellia Serrata (BOSWELLIA PO) Take 1 capsule by mouth daily as needed (for pain).    [provider]  Cholecalciferol (VITAMIN D3) 125 MCG (5000 UT) CAPS Take 5,000 Units by mouth every other day.    [provider]  CITRUS BERGAMOT PO Take 1 capsule by mouth daily as needed (for pain).    [provider]  clopidogrel (PLAVIX) 75 MG tablet TAKE 1 TABLET BY MOUTH EVERY DAY Patient taking differently: Take 75 mg by mouth daily. 10/28/21   Croitoru, Mihai, MD  Coenzyme Q10-Vitamin E (QUNOL ULTRA COQ10) 100-150 MG-UNIT CAPS Take 1  capsule by mouth daily.    [provider]  finasteride (PROSCAR) 5 MG tablet Take 5 mg by mouth daily. 01/19/18   [provider]  gabapentin (NEURONTIN) 100 MG capsule Take 100 mg by mouth daily as needed (for pain).    [provider]  hydrocortisone (ANUSOL-HC) 2.5 % rectal cream Place 1 Application rectally 2 (two) times daily as needed for hemorrhoids (or pain).    [provider]  labetalol (NORMODYNE) 300 MG tablet TAKE 1 TABLET BY MOUTH 2 TIMES DAILY. Patient taking differently: Take 300 mg by mouth 2 (two) times daily. 05/08/21   Croitoru, Mihai, MD  lactobacillus acidophilus (BACID) TABS tablet Take 2 tablets by mouth 2 (two) times daily. Patient taking differently: Take 2 tablets by mouth daily. 04/20/18   Briant Cedar, MD  magnesium hydroxide (MILK OF MAGNESIA) 400 MG/5ML suspension Take 15-30 mLs by mouth daily as needed for mild constipation.    [provider]  NIFEdipine (ADALAT CC) 30 MG 24 hr tablet Take 30 mg by mouth in the morning.    [provider]  NIFEdipine (ADALAT CC) 60 MG 24 hr tablet Take 1 tablet (60 mg total) by mouth every evening. 04/29/22   Croitoru, Mihai, MD  NON FORMULARY Take 1-2 capsules by mouth See admin instructions. Nitric Oxide Beet Root Capsules- Take 1-2 capsules by mouth once a day    [provider]  NON FORMULARY Take 14 g by mouth See  admin instructions. MCT Oil - Keto & Vegan MCTs C8, C10 from Coconuts- Mix 14 grams into a desired drink or food and consume by mouth once a day    [provider]  NON FORMULARY Take 1 capsule by mouth See admin instructions. [place] Rhubarb - Herbal Supplement for Digestive Health - Natural Formula- Take 1 capsule by mouth once a day as needed to support digestive health    [provider]  pantoprazole (PROTONIX) 40 MG tablet Take 1 tablet (40 mg total) by mouth daily. Take along with prednisone 12/19/22   Meredeth Ide, MD  predniSONE  (DELTASONE) 20 MG tablet Take 3 tablets (60 mg total) by mouth daily with breakfast. 12/20/22 02/18/23  Meredeth Ide, MD  rosuvastatin (CRESTOR) 20 MG tablet TOME UNA TABLETA TODOS LOS DIAS Patient taking differently: Take 20 mg by mouth daily. 01/28/21   Croitoru, Mihai, MD  senna-docusate (SENOKOT-S) 8.6-50 MG tablet Take 3 tablets by mouth every evening. Patient taking differently: Take 3 tablets by mouth daily as needed for mild constipation. 04/20/18   Briant Cedar, MD  spironolactone (ALDACTONE) 100 MG tablet Take 1 tablet (100 mg total) by mouth daily. 04/29/22   Croitoru, Mihai, MD  tamsulosin (FLOMAX) 0.4 MG CAPS capsule Take 1 capsule (0.4 mg total) by mouth daily. Patient taking differently: Take 0.4 mg by mouth in the morning and at bedtime. 01/09/17   Massie Maroon, FNP  tiZANidine (ZANAFLEX) 2 MG tablet Take 2 mg by mouth daily as needed for muscle spasms.    [provider]  triamcinolone ointment (KENALOG) 0.1 % Apply 1 Application topically 2 (two) times daily as needed (for dryness).    [provider]      Allergies    Amitriptyline hcl, Other, Losartan potassium, Papaya derivatives, and Sulfa antibiotics    Review of Systems   Review of Systems  Physical Exam Updated Vital Signs BP (!) 107/55   Pulse 61   Temp (!) 97.4 F (36.3 C)   Resp 18   Ht 5\' 7"  (1.702 m)   Wt 58.5 kg   SpO2 94%   BMI 20.20 kg/m  Physical Exam Constitutional:      General: He is not in acute distress. HENT:     Head: Normocephalic and atraumatic.  Eyes:     Conjunctiva/sclera: Conjunctivae normal.     Pupils: Pupils are equal, round, and reactive to light.  Cardiovascular:     Rate and Rhythm: Normal rate and regular rhythm.  Pulmonary:     Effort: Pulmonary effort is normal. No respiratory distress.  Abdominal:     General: There is no distension.     Tenderness: There is no abdominal tenderness.  Musculoskeletal:     Comments: No significant tenderness  with ROM of bilateral hips Posterior iliac crest ttp, sacral iliac ttp, lumbar midline ttp  Skin:    General: Skin is warm and dry.  Neurological:     General: No focal deficit present.     Mental Status: He is alert. Mental status is at baseline.  Psychiatric:        Mood and Affect: Mood normal.        Behavior: Behavior normal.     ED Results / Procedures / Treatments   Labs (all labs ordered are listed, but only abnormal results are displayed) Labs Reviewed - No data to display  EKG None  Radiology CT Lumbar Spine Wo Contrast  Result Date: 02/12/2023 CLINICAL DATA:  Back trauma, no prior imaging (Age >= 16y) EXAM: CT LUMBAR SPINE WITHOUT CONTRAST TECHNIQUE: Multidetector CT imaging of the lumbar spine was performed without intravenous contrast administration. Multiplanar CT image reconstructions were also generated. RADIATION DOSE REDUCTION: This exam was performed according to the departmental dose-optimization program which includes automated exposure control, adjustment of the mA and/or kV according to patient size and/or use of iterative reconstruction technique. COMPARISON:  None Available. FINDINGS: Segmentation: 5 lumbar type vertebrae. Alignment: Normal. Vertebrae: There is a very mild compression deformity of the anterior superior endplate of L3 (series 5, image 13). No focal pathologic process. Paraspinal and other soft tissues: Aortic atherosclerotic calcifications. Disc levels: Mild spinal canal narrowing at L3-L4 and moderate spinal canal narrowing at L4-L5. Moderate bilateral neural foraminal narrowing at L4-L5. Severe right-sided neural foraminal narrowing at L5-S1. IMPRESSION: 1. Very mild acute superior endplate compression deformity at the anterior aspect of L3, age indeterminate. 2. Moderate spinal canal narrowing at L4-L5 3. Severe right-sided neural foraminal narrowing at L5-S1. Aortic Atherosclerosis (ICD10-I70.0). Electronically Signed   By: Lorenza Cambridge M.D.   On:  02/12/2023 11:05   DG Hips Bilat W or Wo Pelvis 3-4 Views  Result Date: 02/12/2023 CLINICAL DATA:  Trauma, fall, pain EXAM: DG HIP (WITH OR WITHOUT PELVIS) 3-4V BILAT COMPARISON:  08/02/2019 FINDINGS: There is previous left hip arthroplasty. No recent fracture or dislocation is seen. Arterial calcifications are noted. There are tiny metallic densities in the soft tissues adjacent to both hips, possibly residual from previous injury. Dextroscoliosis is seen in the lumbar spine. Degenerative changes are noted in lumbar spine with bony spurs, disc space narrowing and facet hypertrophy. IMPRESSION: No recent fracture or dislocation is seen. Previous left hip arthroplasty. Arteriosclerosis.  Lumbar spondylosis. Electronically Signed   By: Ernie Avena M.D.   On: 02/12/2023 09:33    Procedures Procedures    Medications Ordered in ED Medications  oxyCODONE-acetaminophen (PERCOCET/ROXICET) 5-325 MG per tablet 1 tablet (1 tablet Oral Given 02/12/23 6644)    ED Course/ Medical Decision Making/ A&P Clinical Course as of 02/14/23 0347  Thu Feb 12, 2023  1539 Admitted to hospitalist [MT]    Clinical Course User Index [MT] Renaye Rakers Kermit Balo, MD                                 Medical Decision Making Amount and/or Complexity of Data Reviewed Labs: ordered. Radiology: ordered.  Risk OTC drugs. Prescription drug management. Decision regarding hospitalization.   Mechanical fall onto tailbone with low back pain Ddx includes fracture vs disc herniation vs nerve injury vs other  No red flags for cauda equina syndrome - no indication for emergent MRI at this time  CT and xray imaging ordered and personally reviewed and interpreted, showing minor L3 compression fracture.  No pelvic fracture or hip fracture.  PO pain medications ordered  Supplemental history provided by EMS  External records reviewed - hospital course June 2024 for headache, [facility] workup, (vascular ultrasound showing no  significant findings of temporal arteritis).  Patient was reassessed.  He has chronic left-sided weakness from his stroke.  He was initially wanting to go home, but after discussion with the patient and his daughter on the phone, there is some serious concerns about his limited mobility.  The patient's family feel that he is not safe at home and is likely a fall risk, and I agree given his age and hemiparesis.  Will admit  him to the hospital for spinal fracture, PT evaluation, potential rehab placement.  I do not believe there is an indication for TLSO brace at this point as this is quite a bulky and difficult to manage brace, which will be difficult for the patient, particularly with his limited mobility, and essentially wheelchair-bound.  The patient's lumbar spinal fracture is stable, does not require emergency surgery, and he can follow-up as an outpatient with his prior spinal surgeon Dr Danielle Dess in 3-4 weeks.    Final Clinical Impression(s) / ED Diagnoses Final diagnoses:  Fall, initial encounter  Compression fracture of L3 vertebra, initial encounter [facility])    Rx / DC Orders ED Discharge Orders          Ordered    oxyCODONE-acetaminophen (PERCOCET/ROXICET) 5-325 MG tablet  Every 8 hours PRN        02/12/23 1247    senna-docusate (SENOKOT-S) 8.6-50 MG tablet  Daily PRN        02/12/23 1247              Terald Sleeper, MD 02/12/23 1249    Terald Sleeper, MD 02/14/23 [phone]

## 2023-02-12 NOTE — H&P (Signed)
History and Physical   [facility] -  @ [facility] Admission History and Physical [employer], D.O.    Patient Name: Howard Jackson MR#: [id number] Date of Birth: 11/22/1945 Date of Admission: 02/12/2023  Referring MD/NP/PA: Dr. Renaye Rakers Primary Care Physician: [facility]  Chief Complaint:  Chief Complaint  Patient presents with   Fall    HPI: Howard Jackson is a 77 y.o. male with a known history of CVA with residual left sided hemiparesis, HTN, HLD, anxiety, PAD,  primary aldosteronism presents to the emergency department for evaluation of low back pain s/p fall.  Patient was in a usual state of health until this morning at 0500, when he sustained a mechanical fall landing on his tailbone.  Of note, patient was hospitalized in 6/24 for headache and temporal arteritis workup and has been on steroids for that.   At baseline he is able to rise from sitting and pivot into wheelchair which is how he ambulates.  He does follow with neurosurgery clinic  Patient denies fevers/chills, weakness, dizziness, chest pain, shortness of breath, N/V/C/D, abdominal pain, dysuria/frequency, changes in mental status.    Otherwise there has been no change in status. Patient has been taking medication as prescribed and there has been no recent change in medication or diet.  No recent antibiotics.  There has been no recent illness, hospitalizations, travel or sick contacts.    EMS/ED Course: Patient received Percocet, NS 1 L. Medical admission has been requested for further management of intractable low back pain 2/2 L3 compression fracture.  Review of Systems:  CONSTITUTIONAL: No fever/chills, fatigue, weakness, weight gain/loss, headache. EYES: No blurry or double vision. ENT: No tinnitus, postnasal drip, redness or soreness of the oropharynx. RESPIRATORY: No cough, dyspnea, wheeze.  No hemoptysis.  CARDIOVASCULAR: No chest pain, palpitations, syncope,  orthopnea. No lower extremity edema.  GASTROINTESTINAL: No nausea, vomiting, abdominal pain, diarrhea, constipation.  No hematemesis, melena or hematochezia. GENITOURINARY: No dysuria, frequency, hematuria. ENDOCRINE: No polyuria or nocturia. No heat or cold intolerance. HEMATOLOGY: No anemia, bruising, bleeding. INTEGUMENTARY: No rashes, ulcers, lesions. MUSCULOSKELETAL: Positive low back pain. No arthritis, gout. NEUROLOGIC: No numbness, tingling, ataxia, seizure-type activity, positive chronic left sided weakness. PSYCHIATRIC: No anxiety, depression, insomnia.   Past Medical History:  Diagnosis Date   Adrenal gland disorder (HCC)    tumor present - no change-  no recent increase     Arthritis    Benign pheochromocytoma of right adrenal gland 06/23/2014   Never had histological diagnosis, but reportedly initially diagnosed in 1989   Bunion    left foot   Complication of anesthesia    "died on the table during cervical fusion" '89 - believed to be d/t "too much medication" to treat HTN and was later dx with pheochromocytoma   Coronary artery disease    cleared for surg. by Dr. Allyson Sabal - [facility]   H/O hiatal hernia    Hammer toe    Herniated lumbar intervertebral disc    History of anxiety    Hyperlipidemia    Hypertension    Neurogenic bladder    Neuromuscular disorder (HCC)    carpal tunnel problem, even after surgery release    Peripheral arterial disease (HCC)    Primary aldosteronism (HCC)    Stroke (HCC)    in [place]- 1999, L sided weakness, wears a brace on L leg & uses cane    Past Surgical History:  Procedure Laterality Date   ANTERIOR APPROACH HEMI HIP  ARTHROPLASTY Left 04/15/2018   Procedure: ANTERIOR APPROACH HEMI HIP ARTHROPLASTY;  Surgeon: Samson Frederic, MD;  Location: [facility] OR;  Service: Orthopedics;  Laterality: Left;   CARPAL TUNNEL RELEASE     bilateral    CERVICAL FUSION     CORONARY ANGIOPLASTY WITH STENT PLACEMENT     pt reports having 5 stents in his  heart-2011   HERNIA REPAIR     INGUINAL HERNIA REPAIR Left 11/18/2012   Procedure: HERNIA REPAIR INGUINAL ADULT;  Surgeon: Shelly Rubenstein, MD;  Location: [facility] OR;  Service: General;  Laterality: Left;   INSERTION OF MESH Left 11/18/2012   Procedure: INSERTION OF MESH;  Surgeon: Shelly Rubenstein, MD;  Location: [facility] OR;  Service: General;  Laterality: Left;     reports that he has never smoked. He has never used smokeless tobacco. He reports current alcohol use. He reports that he does not use drugs.  Allergies  Allergen Reactions   Amitriptyline Hcl Hypertension   Other Shortness Of Breath and Other (See Comments)    SKIN is sensitive- Guards for the underwear MUST be removed immediately after using  Anesthesia- "They almost could not get me back" (HAS TO TAKE SPIRONOLACTONE NOW)   Losartan Potassium Other (See Comments)    Hypotension   Papaya Derivatives Itching and Other (See Comments)    Itching of the throat   Sulfa Antibiotics Other (See Comments)    Constipation     Family History  Problem Relation Age of Onset   CVA Father    Diabetes Father    Alzheimer's disease Father    Hypertension Sister    Hypertension Brother    CVA Paternal Grandmother    Hypertension Brother    Hypertension Mother     Prior to Admission medications   Medication Sig Start Date End Date Taking? Authorizing Provider  oxyCODONE-acetaminophen (PERCOCET/ROXICET) 5-325 MG tablet Take 1 tablet by mouth every 8 (eight) hours as needed for up to 15 doses for severe pain. 02/12/23  Yes Terald Sleeper, MD  senna-docusate (SENOKOT-S) 8.6-50 MG tablet Take 1 tablet by mouth daily as needed for up to 15 days for mild constipation (To prevent constipation from percocet narcotic). 02/12/23 02/27/23 Yes Trifan, Kermit Balo, MD  amoxicillin-clavulanate (AUGMENTIN) 875-125 MG tablet Take 1 tablet by mouth every 12 (twelve) hours. 12/19/22   Meredeth Ide, MD  aspirin 325 MG tablet Take 325 mg by mouth daily as needed  for mild pain or headache.    [provider]  Boswellia Serrata (BOSWELLIA PO) Take 1 capsule by mouth daily as needed (for pain).    [provider]  Cholecalciferol (VITAMIN D3) 125 MCG (5000 UT) CAPS Take 5,000 Units by mouth every other day.    [provider]  CITRUS BERGAMOT PO Take 1 capsule by mouth daily as needed (for pain).    [provider]  clopidogrel (PLAVIX) 75 MG tablet TAKE 1 TABLET BY MOUTH EVERY DAY Patient taking differently: Take 75 mg by mouth daily. 10/28/21   Croitoru, Mihai, MD  Coenzyme Q10-Vitamin E (QUNOL ULTRA COQ10) 100-150 MG-UNIT CAPS Take 1 capsule by mouth daily.    [provider]  finasteride (PROSCAR) 5 MG tablet Take 5 mg by mouth daily. 01/19/18   [provider]  gabapentin (NEURONTIN) 100 MG capsule Take 100 mg by mouth daily as needed (for pain).    [provider]  hydrocortisone (ANUSOL-HC) 2.5 % rectal cream Place 1 Application rectally 2 (two) times daily  as needed for hemorrhoids (or pain).    [provider]  labetalol (NORMODYNE) 300 MG tablet TAKE 1 TABLET BY MOUTH 2 TIMES DAILY. Patient taking differently: Take 300 mg by mouth 2 (two) times daily. 05/08/21   Croitoru, Mihai, MD  lactobacillus acidophilus (BACID) TABS tablet Take 2 tablets by mouth 2 (two) times daily. Patient taking differently: Take 2 tablets by mouth daily. 04/20/18   Briant Cedar, MD  magnesium hydroxide (MILK OF MAGNESIA) 400 MG/5ML suspension Take 15-30 mLs by mouth daily as needed for mild constipation.    [provider]  NIFEdipine (ADALAT CC) 30 MG 24 hr tablet Take 30 mg by mouth in the morning.    [provider]  NIFEdipine (ADALAT CC) 60 MG 24 hr tablet Take 1 tablet (60 mg total) by mouth every evening. 04/29/22   Croitoru, Mihai, MD  NON FORMULARY Take 1-2 capsules by mouth See admin instructions. Nitric Oxide Beet Root Capsules- Take 1-2 capsules by mouth once a day     [provider]  NON FORMULARY Take 14 g by mouth See admin instructions. MCT Oil - Keto & Vegan MCTs C8, C10 from Coconuts- Mix 14 grams into a desired drink or food and consume by mouth once a day    [provider]  NON FORMULARY Take 1 capsule by mouth See admin instructions. [place] Rhubarb - Herbal Supplement for Digestive Health - Natural Formula- Take 1 capsule by mouth once a day as needed to support digestive health    [provider]  pantoprazole (PROTONIX) 40 MG tablet Take 1 tablet (40 mg total) by mouth daily. Take along with prednisone 12/19/22   Meredeth Ide, MD  predniSONE (DELTASONE) 20 MG tablet Take 3 tablets (60 mg total) by mouth daily with breakfast. 12/20/22 02/18/23  Meredeth Ide, MD  rosuvastatin (CRESTOR) 20 MG tablet TOME UNA TABLETA TODOS LOS DIAS Patient taking differently: Take 20 mg by mouth daily. 01/28/21   Croitoru, Mihai, MD  senna-docusate (SENOKOT-S) 8.6-50 MG tablet Take 3 tablets by mouth every evening. Patient taking differently: Take 3 tablets by mouth daily as needed for mild constipation. 04/20/18   Briant Cedar, MD  spironolactone (ALDACTONE) 100 MG tablet Take 1 tablet (100 mg total) by mouth daily. 04/29/22   Croitoru, Mihai, MD  tamsulosin (FLOMAX) 0.4 MG CAPS capsule Take 1 capsule (0.4 mg total) by mouth daily. Patient taking differently: Take 0.4 mg by mouth in the morning and at bedtime. 01/09/17   Massie Maroon, FNP  tiZANidine (ZANAFLEX) 2 MG tablet Take 2 mg by mouth daily as needed for muscle spasms.    [provider]  triamcinolone ointment (KENALOG) 0.1 % Apply 1 Application topically 2 (two) times daily as needed (for dryness).    [provider]    Physical Exam: Vitals:   02/12/23 0930 02/12/23 1000 02/12/23 1030 02/12/23 1519  BP: 127/70 (!) 100/54 (!) 107/55 (!) 115/57  Pulse:  71 61 (!) 59  Resp:   18 18  Temp:    97.8 F (36.6 C)  TempSrc:    Oral  SpO2:  95% 94% 96%  Weight:       Height:        GENERAL: 77 y.o.-year-old black male patient, well-developed, well-nourished lying in the bed in no acute distress.  Pleasant and cooperative.   HEENT: Head atraumatic, normocephalic. Pupils equal. Mucus membranes moist. NECK: Supple. No JVD. CHEST: Normal breath sounds bilaterally. No wheezing, rales,  rhonchi or crackles. No use of accessory muscles of respiration.  No reproducible chest wall tenderness.  CARDIOVASCULAR: S1, S2 normal. No murmurs, rubs, or gallops. Cap refill <2 seconds. Pulses intact distally.  ABDOMEN: Soft, nondistended, nontender. No rebound, guarding, rigidity. Normoactive bowel sounds present in all four quadrants.  EXTREMITIES: No pedal edema, cyanosis, or clubbing. No calf tenderness or Homan's sign.  NEUROLOGIC: The patient is alert and oriented x 3. Cranial nerves II through XII are grossly intact with no focal sensorimotor deficit. PSYCHIATRIC:  Normal affect, mood, thought content. SKIN: Warm, dry, and intact without obvious rash, lesion, or ulcer.  SPINE:  lumbar paraverterbal muscle spasm, tenderness to palp;ation    Labs on Admission:  CBC: Recent Labs  Lab 02/12/23 1416  WBC 13.1*  HGB 16.1  HCT 48.1  MCV 95.2  PLT 140*   Basic Metabolic Panel: Recent Labs  Lab 02/12/23 1416  NA 125*  K 4.6  CL 94*  CO2 22  GLUCOSE 160*  BUN 33*  CREATININE 0.74  CALCIUM 8.4*   GFR: Estimated Creatinine Clearance: 62 mL/min (by C-G formula based on SCr of 0.74 mg/dL). Liver Function Tests: No results for input(s): "AST", "ALT", "ALKPHOS", "BILITOT", "PROT", "ALBUMIN" in the last 168 hours. No results for input(s): "LIPASE", "AMYLASE" in the last 168 hours. No results for input(s): "AMMONIA" in the last 168 hours. Coagulation Profile: No results for input(s): "INR", "PROTIME" in the last 168 hours. Cardiac Enzymes: No results for input(s): "CKTOTAL", "CKMB", "CKMBINDEX", "TROPONINI" in the last 168 hours. BNP (last 3 results) No  results for input(s): "PROBNP" in the last 8760 hours. HbA1C: No results for input(s): "HGBA1C" in the last 72 hours. CBG: No results for input(s): "GLUCAP" in the last 168 hours. Lipid Profile: No results for input(s): "CHOL", "HDL", "LDLCALC", "TRIG", "CHOLHDL", "LDLDIRECT" in the last 72 hours. Thyroid Function Tests: No results for input(s): "TSH", "T4TOTAL", "FREET4", "T3FREE", "THYROIDAB" in the last 72 hours. Anemia Panel: No results for input(s): "VITAMINB12", "FOLATE", "FERRITIN", "TIBC", "IRON", "RETICCTPCT" in the last 72 hours. Urine analysis:    Component Value Date/Time   COLORURINE YELLOW 12/17/2022 2050   APPEARANCEUR CLEAR 12/17/2022 2050   LABSPEC 1.009 12/17/2022 2050   PHURINE 6.0 12/17/2022 2050   GLUCOSEU NEGATIVE 12/17/2022 2050   HGBUR NEGATIVE 12/17/2022 2050   BILIRUBINUR NEGATIVE 12/17/2022 2050   KETONESUR NEGATIVE 12/17/2022 2050   PROTEINUR NEGATIVE 12/17/2022 2050   UROBILINOGEN 0.2 11/25/2016 1448   NITRITE NEGATIVE 12/17/2022 2050   LEUKOCYTESUR NEGATIVE 12/17/2022 2050   Sepsis Labs: @LABRCNTIP (procalcitonin:4,lacticidven:4) )No results found for this or any previous visit (from the past 240 hour(s)).   Radiological Exams on Admission: CT Lumbar Spine Wo Contrast  Result Date: 02/12/2023 CLINICAL DATA:  Back trauma, no prior imaging (Age >= 16y) EXAM: CT LUMBAR SPINE WITHOUT CONTRAST TECHNIQUE: Multidetector CT imaging of the lumbar spine was performed without intravenous contrast administration. Multiplanar CT image reconstructions were also generated. RADIATION DOSE REDUCTION: This exam was performed according to the departmental dose-optimization program which includes automated exposure control, adjustment of the mA and/or kV according to patient size and/or use of iterative reconstruction technique. COMPARISON:  None Available. FINDINGS: Segmentation: 5 lumbar type vertebrae. Alignment: Normal. Vertebrae: There is a very mild compression  deformity of the anterior superior endplate of L3 (series 5, image 13). No focal pathologic process. Paraspinal and other soft tissues: Aortic atherosclerotic calcifications. Disc levels: Mild spinal canal narrowing at L3-L4 and moderate spinal canal narrowing at L4-L5. Moderate bilateral neural foraminal  narrowing at L4-L5. Severe right-sided neural foraminal narrowing at L5-S1. IMPRESSION: 1. Very mild acute superior endplate compression deformity at the anterior aspect of L3, age indeterminate. 2. Moderate spinal canal narrowing at L4-L5 3. Severe right-sided neural foraminal narrowing at L5-S1. Aortic Atherosclerosis (ICD10-I70.0). Electronically Signed   By: Lorenza Cambridge M.D.   On: 02/12/2023 11:05   DG Hips Bilat W or Wo Pelvis 3-4 Views  Result Date: 02/12/2023 CLINICAL DATA:  Trauma, fall, pain EXAM: DG HIP (WITH OR WITHOUT PELVIS) 3-4V BILAT COMPARISON:  08/02/2019 FINDINGS: There is previous left hip arthroplasty. No recent fracture or dislocation is seen. Arterial calcifications are noted. There are tiny metallic densities in the soft tissues adjacent to both hips, possibly residual from previous injury. Dextroscoliosis is seen in the lumbar spine. Degenerative changes are noted in lumbar spine with bony spurs, disc space narrowing and facet hypertrophy. IMPRESSION: No recent fracture or dislocation is seen. Previous left hip arthroplasty. Arteriosclerosis.  Lumbar spondylosis. Electronically Signed   By: Ernie Avena M.D.   On: 02/12/2023 09:33     Assessment/Plan  This is a 77 y.o. male with a history of CVA with residual left sided hemiparesis, HTN, HLD, anxiety, PAD,  primary aldosteronism now being admitted with:  #. Intractable low back pain 2/2 L3 compression fracture s/p mechanical fall - Admit to inpatient - Pain control - Neurosurgery consult for a.m. - PT eval and treat for consideration of rehab placement -Continue Zanaflex  #. Mild hyponatremia - Rec'd 1L NS in ER -  Recheck BMP  #. H/O CVA with L hemiparesis - Continue Plavix - Fall precautions  #. History of HTN - Continue labetalol, nifedipine  #. History of HLD - Continue Crestor  #.  History of GERD - Continue Protonix  #.  Continue prednisone  #.  History of BPH - Continue Proscar, tamsulosin  Admission status: Inpatient IV Fluids: HL for now Diet/Nutrition: Heart healthy Consults called: PT  DVT Px: Lovenox, SCDs and early ambulation. Code Status: Full Code  Disposition Plan: TBD  All the records are reviewed and case discussed with ED provider. Management plans discussed with the patient and/or family who express understanding and agree with plan of care.  Darci Lykins D.O. on 02/12/2023 at 4:25 PM CC: Primary care physician; [employer], [address] [place]   02/12/2023, 4:25 PM

## 2023-02-13 ENCOUNTER — Encounter (HOSPITAL_COMMUNITY): Payer: Self-pay | Admitting: Family Medicine

## 2023-02-13 ENCOUNTER — Inpatient Hospital Stay (HOSPITAL_COMMUNITY): Payer: Medicare (Managed Care)

## 2023-02-13 DIAGNOSIS — W19XXXA Unspecified fall, initial encounter: Secondary | ICD-10-CM | POA: Diagnosis not present

## 2023-02-13 DIAGNOSIS — N400 Enlarged prostate without lower urinary tract symptoms: Secondary | ICD-10-CM | POA: Diagnosis not present

## 2023-02-13 DIAGNOSIS — S32030A Wedge compression fracture of third lumbar vertebra, initial encounter for closed fracture: Secondary | ICD-10-CM | POA: Diagnosis not present

## 2023-02-13 LAB — OSMOLALITY, URINE: Osmolality, Ur: 567 mOsm/kg (ref 300–900)

## 2023-02-13 LAB — SODIUM, URINE, RANDOM: Sodium, Ur: 145 mmol/L

## 2023-02-13 LAB — OSMOLALITY: Osmolality: 274 mOsm/kg — ABNORMAL LOW (ref 275–295)

## 2023-02-13 MED ORDER — KETOROLAC TROMETHAMINE 15 MG/ML IJ SOLN
15.0000 mg | Freq: Three times a day (TID) | INTRAMUSCULAR | Status: DC
  Administered 2023-02-13 – 2023-02-14 (×2): 15 mg via INTRAVENOUS
  Filled 2023-02-13 (×9): qty 1

## 2023-02-13 NOTE — Progress Notes (Signed)
PT Cancellation Note  Patient Details Name: Howard Jackson MRN: [id number] DOB: 1945-11-24   Cancelled Treatment:    Reason Eval/Treat Not Completed: Pain limiting ability to participate (pt stated he's not able to tolerate any movement 2* pain. Will follow.)   Tamala Ser PT 02/13/2023  Acute Rehabilitation Services  Office [phone]

## 2023-02-13 NOTE — Plan of Care (Signed)
  Problem: Education: Goal: Knowledge of General Education information will improve Description: Including pain rating scale, medication(s)/side effects and non-pharmacologic comfort measures Outcome: Progressing   Problem: Health Behavior/Discharge Planning: Goal: Ability to manage health-related needs will improve Outcome: Progressing   Problem: Clinical Measurements: Goal: Ability to maintain clinical measurements within normal limits will improve Outcome: Progressing   Problem: Activity: Goal: Risk for activity intolerance will decrease Outcome: Progressing   Problem: Nutrition: Goal: Adequate nutrition will be maintained Outcome: Progressing   Problem: Nutrition: Goal: Adequate nutrition will be maintained Outcome: Progressing   Problem: Coping: Goal: Level of anxiety will decrease Outcome: Progressing   Problem: Elimination: Goal: Will not experience complications related to bowel motility Outcome: Progressing   Problem: Pain Managment: Goal: General experience of comfort will improve Outcome: Progressing   Problem: Safety: Goal: Ability to remain free from injury will improve Outcome: Progressing   Problem: Skin Integrity: Goal: Risk for impaired skin integrity will decrease Outcome: Progressing

## 2023-02-13 NOTE — Progress Notes (Signed)
[name]                                                                              Radley Barto, is a 77 y.o. male, DOB - [birth year], ION:[id number] Admit date - 02/12/2023    Outpatient Primary MD for the patient is [facility], [address] [facility]  LOS - 1  days  Chief Complaint  Patient presents with   Fall       Brief summary   Patient is a 77 year old male with CVA, residual left-sided hemiparesis, HTN, HLD, anxiety, PAD, primary hyper aldosteronism presented to ED with low back pain after a fall.  Patient ported that he lives alone, wheelchair-bound, in usual state of health until the morning of admission when he sustained a mechanical fall landing on his tailbone. Of note, patient was hospitalized in 6/24 for headache and temporal arteritis workup and has been on steroids for that.   At baseline he is able to rise from sitting and pivot into wheelchair.  He does follow with neurosurgery clinic CT lumbar spine showed acute superior endplate compression deformity at anterior aspect of L3, age-indeterminate   Assessment & Plan    Principal Problem: Mechanical fall with intractable back pain secondary to L3  compression fracture (HCC) -Continue pain control, PT OT evaluation. -Patient lives alone, wheelchair-bound -Still having significant pain, added IV Toradol, continue tizanidine, Norco   Active Problems: Mild hyponatremia -Appears to have chronic hyponatremia -Urine osmolality 567, urine sodium 145, serum osmolarity 274, likely has SIADH -Currently improving    HTN (hypertension) -BP stable, continue labetalol, nifedipine    History of stroke with left-sided hemiparesis -Continue BP control, Plavix, fall precautions  History of headaches and recent temporal arteritis -Continue prednisone    BPH (benign prostatic hyperplasia) -Continue Proscar, tamsulosin    Hypercholesterolemia -Continue Crestor    Chronic diastolic CHF  (congestive heart failure) (HCC) -Euvolemic, no acute issues, no recent echocardiogram   Estimated body mass index is 20.2 kg/m as calculated from the following:   Height as of this encounter: 5\' 7"  (1.702 m).   Weight as of this encounter: 58.5 kg.  Code Status: Full code DVT Prophylaxis:  enoxaparin (LOVENOX) injection 40 mg Start: 02/12/23 2200 SCDs Start: 02/12/23 1756   Level of Care: Level of care: Med-Surg Family Communication: Updated patient Disposition Plan:      Remains inpatient appropriate:   Per patient lives alone, await PT OT evaluation, evaluation   Procedures:  None  Consultants:   IR  Antimicrobials:   Anti-infectives (From admission, onward)    None          Medications  acidophilus  2 capsule Oral Daily   [START ON 02/14/2023] cholecalciferol  5,000 Units Oral QODAY   clopidogrel  75 mg Oral Daily   enoxaparin (LOVENOX) injection  40 mg Subcutaneous Q24H   feeding supplement  237 mL Oral BID BM   finasteride  5 mg Oral Daily   labetalol  300 mg Oral BID   NIFEdipine  60 mg Oral QPM   NIFEdipine  30 mg Oral Daily   pantoprazole  40 mg Oral  Daily   predniSONE  60 mg Oral Q breakfast   rosuvastatin  20 mg Oral Daily   sodium chloride flush  3 mL Intravenous Q12H   spironolactone  100 mg Oral Daily   tamsulosin  0.4 mg Oral Daily      Subjective:   Thayne Cindric was seen and examined today.  Pain in the back, 7/10, no radiculopathy.  Patient denies dizziness, chest pain, shortness of breath, abdominal pain, N/V/D/C, no fevers.  Objective:   Vitals:   02/12/23 2213 02/13/23 0128 02/13/23 0530 02/13/23 1032  BP: 123/72 (!) 141/85 132/70 137/77  Pulse: 69 66 (!) 59 70  Resp: 15 14 14 16   Temp: 97.8 F (36.6 C) 97.9 F (36.6 C) 98.5 F (36.9 C) 98.5 F (36.9 C)  TempSrc: Oral   Oral  SpO2: 99% 100% 99% 100%  Weight:      Height:        Intake/Output Summary (Last 24 hours) at 02/13/2023 1250 Last data filed at 02/13/2023  1044 Gross per 24 hour  Intake 1423 ml  Output 850 ml  Net 573 ml     Wt Readings from Last 3 Encounters:  02/12/23 58.5 kg  12/17/22 56.3 kg  12/03/22 59 kg     Exam General: Alert and oriented x 3, NAD Cardiovascular: S1 S2 auscultated,  RRR Respiratory: Clear to auscultation bilaterally, no wheezing Gastrointestinal: Soft, nontender, nondistended, + bowel sounds Ext: no pedal edema bilaterally Neuro: chronic left-sided hemiparesis from previous stroke, RUE, RLE 5/5 Skin: No rashes Psych: Normal affect     Data Reviewed:  I have personally reviewed following labs    CBC Lab Results  Component Value Date   WBC 11.2 (H) 02/13/2023   RBC 4.88 02/13/2023   HGB 15.9 02/13/2023   HCT 47.3 02/13/2023   MCV 96.9 02/13/2023   MCH 32.6 02/13/2023   PLT 121 (L) 02/13/2023   MCHC 33.6 02/13/2023   RDW 15.4 02/13/2023   LYMPHSABS 1.2 12/17/2022   MONOABS 1.0 12/17/2022   EOSABS 0.1 12/17/2022   BASOSABS 0.0 12/17/2022     Last metabolic panel Lab Results  Component Value Date   NA 129 (L) 02/13/2023   K 3.9 02/13/2023   CL 96 (L) 02/13/2023   CO2 24 02/13/2023   BUN 23 02/13/2023   CREATININE 0.65 02/13/2023   GLUCOSE 81 02/13/2023   GFRNONAA >60 02/13/2023   GFRAA 64 12/16/2019   CALCIUM 8.3 (L) 02/13/2023   PHOS 3.5 12/18/2022   PROT 5.0 (L) 02/13/2023   ALBUMIN 3.0 (L) 02/13/2023   BILITOT 1.0 02/13/2023   ALKPHOS 46 02/13/2023   AST 19 02/13/2023   ALT 39 02/13/2023   ANIONGAP 9 02/13/2023    CBG (last 3)  No results for input(s): "GLUCAP" in the last 72 hours.    Coagulation Profile: No results for input(s): "INR", "PROTIME" in the last 168 hours.   Radiology Studies: I have personally reviewed the imaging studies  CT Lumbar Spine Wo Contrast  Result Date: 02/12/2023 CLINICAL DATA:  Back trauma, no prior imaging (Age >= 16y) EXAM: CT LUMBAR SPINE WITHOUT CONTRAST TECHNIQUE: Multidetector CT imaging of the lumbar spine was performed without  intravenous contrast administration. Multiplanar CT image reconstructions were also generated. RADIATION DOSE REDUCTION: This exam was performed according to the departmental dose-optimization program which includes automated exposure control, adjustment of the mA and/or kV according to patient size and/or use of iterative reconstruction technique. COMPARISON:  None Available. FINDINGS: Segmentation: 5 lumbar  type vertebrae. Alignment: Normal. Vertebrae: There is a very mild compression deformity of the anterior superior endplate of L3 (series 5, image 13). No focal pathologic process. Paraspinal and other soft tissues: Aortic atherosclerotic calcifications. Disc levels: Mild spinal canal narrowing at L3-L4 and moderate spinal canal narrowing at L4-L5. Moderate bilateral neural foraminal narrowing at L4-L5. Severe right-sided neural foraminal narrowing at L5-S1. IMPRESSION: 1. Very mild acute superior endplate compression deformity at the anterior aspect of L3, age indeterminate. 2. Moderate spinal canal narrowing at L4-L5 3. Severe right-sided neural foraminal narrowing at L5-S1. Aortic Atherosclerosis (ICD10-I70.0). Electronically Signed   By: Lorenza Cambridge M.D.   On: 02/12/2023 11:05   DG Hips Bilat W or Wo Pelvis 3-4 Views  Result Date: 02/12/2023 CLINICAL DATA:  Trauma, fall, pain EXAM: DG HIP (WITH OR WITHOUT PELVIS) 3-4V BILAT COMPARISON:  08/02/2019 FINDINGS: There is previous left hip arthroplasty. No recent fracture or dislocation is seen. Arterial calcifications are noted. There are tiny metallic densities in the soft tissues adjacent to both hips, possibly residual from previous injury. Dextroscoliosis is seen in the lumbar spine. Degenerative changes are noted in lumbar spine with bony spurs, disc space narrowing and facet hypertrophy. IMPRESSION: No recent fracture or dislocation is seen. Previous left hip arthroplasty. Arteriosclerosis.  Lumbar spondylosis. Electronically Signed   By: Ernie Avena M.D.   On: 02/12/2023 09:33         M.D. [name] 02/13/2023, 12:50 PM  Available via Epic secure chat 7am-7pm After 7 pm, please refer to night coverage provider listed on amion.

## 2023-02-14 DIAGNOSIS — S32030A Wedge compression fracture of third lumbar vertebra, initial encounter for closed fracture: Secondary | ICD-10-CM | POA: Diagnosis not present

## 2023-02-14 DIAGNOSIS — W19XXXA Unspecified fall, initial encounter: Secondary | ICD-10-CM | POA: Diagnosis not present

## 2023-02-14 DIAGNOSIS — Z8673 Personal history of transient ischemic attack (TIA), and cerebral infarction without residual deficits: Secondary | ICD-10-CM | POA: Diagnosis not present

## 2023-02-14 DIAGNOSIS — N400 Enlarged prostate without lower urinary tract symptoms: Secondary | ICD-10-CM | POA: Diagnosis not present

## 2023-02-14 DIAGNOSIS — E7800 Pure hypercholesterolemia, unspecified: Secondary | ICD-10-CM

## 2023-02-14 MED ORDER — ALUM & MAG HYDROXIDE-SIMETH 200-200-20 MG/5ML PO SUSP
15.0000 mL | ORAL | Status: DC | PRN
  Administered 2023-02-14 – 2023-02-19 (×3): 15 mL via ORAL
  Filled 2023-02-14 (×3): qty 30

## 2023-02-14 MED ORDER — DOCUSATE SODIUM 100 MG PO CAPS
100.0000 mg | ORAL_CAPSULE | Freq: Two times a day (BID) | ORAL | Status: DC
  Administered 2023-02-14 – 2023-02-22 (×12): 100 mg via ORAL
  Filled 2023-02-14 (×15): qty 1

## 2023-02-14 MED ORDER — SODIUM CHLORIDE 1 G PO TABS
1.0000 g | ORAL_TABLET | Freq: Two times a day (BID) | ORAL | Status: DC
  Administered 2023-02-14 – 2023-02-17 (×6): 1 g via ORAL
  Filled 2023-02-14 (×10): qty 1

## 2023-02-14 MED ORDER — TAMSULOSIN HCL 0.4 MG PO CAPS
0.4000 mg | ORAL_CAPSULE | Freq: Once | ORAL | Status: AC
  Administered 2023-02-14: 0.4 mg via ORAL
  Filled 2023-02-14: qty 1

## 2023-02-14 MED ORDER — POLYETHYLENE GLYCOL 3350 17 G PO PACK
17.0000 g | PACK | Freq: Every day | ORAL | Status: DC
  Administered 2023-02-16 – 2023-02-23 (×2): 17 g via ORAL
  Filled 2023-02-14 (×6): qty 1

## 2023-02-14 NOTE — Plan of Care (Addendum)
IR is following the patient for L3 KP, CT reviewed and MR L spine for further eval requested by Dr. Elby Showers.  MR will be reviewed on Monday.  Patient currently on Plavix which requires 5 day hold. If anatomy amenable for KP, the earliest it can be performed would be Friday 8/9 if Plavix can be held starting tomorrow. Informed primary team.   Please call IR for questions and concerns.   Lynann Bologna  PA-C 02/14/2023 1:34 PM

## 2023-02-14 NOTE — Progress Notes (Signed)
[name]                                                                              Howard Jackson, is a 77 y.o. male, DOB - [birth year], ZOX:[id number] Admit date - 02/12/2023    Outpatient Primary MD for the patient is [facility] [address] [facility] [employer]  LOS - 2  days  Chief Complaint  Patient presents with   Fall       Brief summary   Patient is a 77 year old male with CVA, residual left-sided hemiparesis, HTN, HLD, anxiety, PAD, primary hyper aldosteronism presented to ED with low back pain after a fall.  Patient ported that he lives alone, wheelchair-bound, in usual state of health until the morning of admission when he sustained a mechanical fall landing on his tailbone. Of note, patient was hospitalized in 6/24 for headache and temporal arteritis workup and has been on steroids for that.   At baseline he is able to rise from sitting and pivot into wheelchair.  He does follow with neurosurgery clinic CT lumbar spine showed acute superior endplate compression deformity at anterior aspect of L3, age-indeterminate   Assessment & Plan    Principal Problem: Mechanical fall with intractable back pain secondary to L3  compression fracture (HCC) -Continue pain control, PT OT evaluation. -Patient lives alone, wheelchair-bound -Still having significant pain, continue pain management -IR consulted for possible vertebroplasty.  MRI lumbar spine showed acute L3 compression fracture with slight height loss. -Unable to participate with PT evaluation due to pain   Active Problems: Mild hyponatremia -Appears to have chronic hyponatremia -Urine osmolality 567, urine sodium 145, serum osmolarity 274, likely has SIADH -Sodium 125, will start on salt tabs 1 g twice daily    HTN (hypertension) -BP stable, continue labetalol, nifedipine    History of stroke with left-sided hemiparesis -Continue BP control, Plavix, fall precautions  History of headaches and  recent temporal arteritis -Continue prednisone    BPH (benign prostatic hyperplasia) -Continue Proscar, tamsulosin    Hypercholesterolemia -Continue Crestor    Chronic diastolic CHF (congestive heart failure) (HCC) -Euvolemic, no acute issues, no recent echocardiogram  Primary hyper aldosteronism -Continue Aldactone   Estimated body mass index is 20.2 kg/m as calculated from the following:   Height as of this encounter: 5\' 7"  (1.702 m).   Weight as of this encounter: 58.5 kg.  Code Status: Full code DVT Prophylaxis:  enoxaparin (LOVENOX) injection 40 mg Start: 02/12/23 2200 SCDs Start: 02/12/23 1756   Level of Care: Level of care: Med-Surg Family Communication: Updated patient Disposition Plan:      Remains inpatient appropriate:   Per patient lives alone, await PT OT evaluation, evaluation   Procedures:  None  Consultants:   IR  Antimicrobials:   Anti-infectives (From admission, onward)    None          Medications  acidophilus  2 capsule Oral Daily   cholecalciferol  5,000 Units Oral QODAY   clopidogrel  75 mg Oral Daily   docusate sodium  100 mg Oral BID   enoxaparin (LOVENOX) injection  40 mg Subcutaneous Q24H   feeding supplement  237 mL Oral BID BM   finasteride  5 mg Oral Daily   ketorolac  15 mg Intravenous Q8H   labetalol  300 mg Oral BID   NIFEdipine  60 mg Oral QPM   NIFEdipine  30 mg Oral Daily   pantoprazole  40 mg Oral Daily   polyethylene glycol  17 g Oral Daily   predniSONE  60 mg Oral Q breakfast   rosuvastatin  20 mg Oral Daily   sodium chloride flush  3 mL Intravenous Q12H   sodium chloride  1 g Oral BID WC   spironolactone  100 mg Oral Daily   tamsulosin  0.4 mg Oral Daily      Subjective:   Howard Jackson was seen and examined today.  Still having back pain, no acute nausea vomiting, chest pain or shortness of breath.  Objective:   Vitals:   02/13/23 1834 02/13/23 2157 02/14/23 0547 02/14/23 1016  BP: 123/60 131/63  (!) 141/66 127/72  Pulse: 70 70 65 82  Resp: 16 18 18 17   Temp: 98.6 F (37 C) 98.1 F (36.7 C) 97.6 F (36.4 C) 99.1 F (37.3 C)  TempSrc: Oral Oral  Oral  SpO2: 97% 98% 100% 100%  Weight:      Height:        Intake/Output Summary (Last 24 hours) at 02/14/2023 1232 Last data filed at 02/14/2023 1045 Gross per 24 hour  Intake 900 ml  Output 950 ml  Net -50 ml     Wt Readings from Last 3 Encounters:  02/12/23 58.5 kg  12/17/22 56.3 kg  12/03/22 59 kg   Physical Exam General: Alert and oriented x 3, NAD Cardiovascular: S1 S2 clear, RRR.  Respiratory: CTAB Gastrointestinal: Soft, nontender, nondistended, NBS Ext: no pedal edema bilaterally Neuro: left-sided weakness Psych: Normal    Data Reviewed:  I have personally reviewed following labs    CBC Lab Results  Component Value Date   WBC 11.6 (H) 02/14/2023   RBC 4.78 02/14/2023   HGB 14.9 02/14/2023   HCT 45.9 02/14/2023   MCV 96.0 02/14/2023   MCH 31.2 02/14/2023   PLT 104 (L) 02/14/2023   MCHC 32.5 02/14/2023   RDW 15.4 02/14/2023   LYMPHSABS 1.2 12/17/2022   MONOABS 1.0 12/17/2022   EOSABS 0.1 12/17/2022   BASOSABS 0.0 12/17/2022     Last metabolic panel Lab Results  Component Value Date   NA 125 (L) 02/14/2023   K 4.1 02/14/2023   CL 92 (L) 02/14/2023   CO2 24 02/14/2023   BUN 28 (H) 02/14/2023   CREATININE 0.90 02/14/2023   GLUCOSE 173 (H) 02/14/2023   GFRNONAA >60 02/14/2023   GFRAA 64 12/16/2019   CALCIUM 7.9 (L) 02/14/2023   PHOS 3.5 12/18/2022   PROT 5.0 (L) 02/13/2023   ALBUMIN 3.0 (L) 02/13/2023   BILITOT 1.0 02/13/2023   ALKPHOS 46 02/13/2023   AST 19 02/13/2023   ALT 39 02/13/2023   ANIONGAP 9 02/14/2023    CBG (last 3)  No results for input(s): "GLUCAP" in the last 72 hours.    Coagulation Profile: No results for input(s): "INR", "PROTIME" in the last 168 hours.   Radiology Studies: I have personally reviewed the imaging studies  MR LUMBAR SPINE WO CONTRAST  Result  Date: 02/13/2023 CLINICAL DATA:  Lumbar compression fracture. EXAM: MRI LUMBAR SPINE WITHOUT CONTRAST TECHNIQUE: Multiplanar, multisequence MR imaging of the lumbar spine was performed. No intravenous contrast was administered. COMPARISON:  Lumbar CT from yesterday FINDINGS:  Segmentation:  Standard. Alignment:  Mild scoliosis and L3-4 anterolisthesis. Vertebrae: Cores on tool band of marrow edema in the L3 body following a hypointense fracture plane. Height loss is minimal and non progressed. No evidence of bone lesion. Conus medullaris and cauda equina: Conus extends to the L1-2 level. Conus and cauda equina appear normal. Paraspinal and other soft tissues: Negative for perispinal mass or inflammation. Asymmetric atrophy of the left psoas. Disc levels: T12- L1: Unremarkable. L1-L2: Unremarkable. L2-L3: Foraminal predominant disc bulging with posterior annular fissure. Negative facets. L3-L4: Degenerative facet spurring with mild anterolisthesis. The disc is circumferentially bulging with biforaminal protrusion. Mild annular fissuring. A left foraminal protrusion indents the left L3 nerve root. L4-L5: Disc narrowing and bulging with left foraminal protrusion. Degenerative facet spurring on both sides. Mild to moderate spinal stenosis. The left foraminal disc protrusion protrusion indents the left L4 nerve root. L5-S1:Bulging disc with right foraminal protrusion causing moderate foraminal stenosis. Mild contribution from facet spurring on the right. IMPRESSION: 1. Acute L3 compression fracture with slight height loss. 2. Generalized lumbar spine degeneration with scoliosis and mild L3-4 anterolisthesis. 3. Moderate foraminal narrowing on the right at L5-S1 and left at L3-4, L4-5. 4. Mild to moderate spinal stenosis at L4-5. Electronically Signed   By: Tiburcio Pea M.D.   On: 02/13/2023 18:06         M.D. [name] 02/14/2023, 12:32 PM  Available via Epic secure chat 7am-7pm After 7 pm,  please refer to night coverage provider listed on amion.

## 2023-02-14 NOTE — Evaluation (Signed)
Physical Therapy Evaluation Patient Details Name: Howard Jackson MRN: [id number] DOB: November 06, 1945 Today's Date: 02/14/2023  History of Present Illness  77 y.o. male admitted with Intractable low back pain due to  L3 compression fracture s/p mechanical fall. IR consulted for possible Kyphoplasty 8/9. Recent admission 01/05/23 for temporal arteritis.  PMH: CVA with residual left sided hemiparesis, HTN, HLD, anxiety, PAD,  primary aldosteronism   Clinical Impression  Pt admitted with above diagnosis.  At baseline pt is able to transfer to w/c, mobilizes in home from w/c level, has aide for a few hrs 5days/wk; pt is limited to sitting EOB at this time and unable to attempt transfer d/t pain; will continue to follow and mobilize as pt tolerates. Patient will benefit from continued  follow up therapy, <3 hours/day at d/c, pending progress in acute setting.   Pt currently with functional limitations due to the deficits listed below (see PT Problem List). Pt will benefit from acute skilled PT to increase their independence and safety with mobility to allow discharge.           If plan is discharge home, recommend the following: A little help with walking and/or transfers;A little help with bathing/dressing/bathroom;Assist for transportation;Help with stairs or ramp for entrance;Assistance with cooking/housework   Can travel by private vehicle   No    Equipment Recommendations None recommended by PT  Recommendations for Other Services       Functional Status Assessment Patient has had a recent decline in their functional status and demonstrates the ability to make significant improvements in function in a reasonable and predictable amount of time.     Precautions / Restrictions Precautions Precautions: Fall;Back Precaution Comments: pt unable to log roll to R d/t L hemiparesis Restrictions Weight Bearing Restrictions: No      Mobility  Bed Mobility Overal bed mobility: Needs  Assistance Bed Mobility: Supine to Sit, Sit to Supine     Supine to sit: Min guard Sit to supine: Min assist   General bed mobility comments: incr time, use of rail to come to sit; assist to lift LEs on to bed    Transfers                   General transfer comment: pt declined d/t pain    Ambulation/Gait                  Stairs            Wheelchair Mobility     Tilt Bed    Modified Rankin (Stroke Patients Only)       Balance Overall balance assessment: Needs assistance Sitting-balance support: No upper extremity supported Sitting balance-Leahy Scale: Fair Sitting balance - Comments: wt shifting ltd by pain                                     Pertinent Vitals/Pain Pain Assessment Pain Assessment: Faces Faces Pain Scale: Hurts little more Pain Location: back Pain Descriptors / Indicators: Grimacing Pain Intervention(s): Limited activity within patient's tolerance, Monitored during session, Repositioned    Home Living Family/patient expects to be discharged to:: Private residence Living Arrangements: Alone Available Help at Discharge: Personal care attendant Type of Home: Apartment Home Access: Level entry         Home Equipment: Wheelchair - power;Tub bench;Hospital bed Additional Comments: He has daily home health aides for ~3 to 4 hours  5d/wk;  He was receiving home health PT and OT.    Prior Function               Mobility Comments: reports he can perform transfers independently to/from power w/c however waits for his caregiver, caregiver from 1130 - 1330 ish daily ADLs Comments: He required assist from his home aides for cleaning, meal prep, showering, and dressing. He performed toileting without assist (used power wheelchair to get into bathroom at home).     Hand Dominance        Extremity/Trunk Assessment   Upper Extremity Assessment Upper Extremity Assessment: Defer to OT evaluation;LUE  deficits/detail LUE Deficits / Details: baseline limitations d/t CVA/no functional use at baseline; crepitus /pain shoulder with attempted elbow ROM    Lower Extremity Assessment Lower Extremity Assessment: RLE deficits/detail RLE Deficits / Details: grossly WFL LLE Deficits / Details: extensor contracture knee d/t CVA; ankle to passively df ankle to neutral       Communication   Communication: No difficulties  Cognition Arousal/Alertness: Awake/alert Behavior During Therapy: WFL for tasks assessed/performed Overall Cognitive Status: Within Functional Limits for tasks assessed                                          General Comments      Exercises     Assessment/Plan    PT Assessment Patient needs continued PT services  PT Problem List Decreased balance;Decreased mobility;Pain;Decreased activity tolerance       PT Treatment Interventions Functional mobility training;Therapeutic activities;Patient/family education;Therapeutic exercise    PT Goals (Current goals can be found in the Care Plan section)  Acute Rehab PT Goals Patient Stated Goal: have less pain PT Goal Formulation: With patient Time For Goal Achievement: 02/28/23 Potential to Achieve Goals: Fair    Frequency Min 1X/week     Co-evaluation               AM-PAC PT "6 Clicks" Mobility  Outcome Measure Help needed turning from your back to your side while in a flat bed without using bedrails?: A Lot Help needed moving from lying on your back to sitting on the side of a flat bed without using bedrails?: A Little Help needed moving to and from a bed to a chair (including a wheelchair)?: A Lot Help needed standing up from a chair using your arms (e.g., wheelchair or bedside chair)?: A Lot Help needed to walk in hospital room?: Total Help needed climbing 3-5 steps with a railing? : Total 6 Click Score: 11    End of Session   Activity Tolerance: Patient limited by pain Patient left:  with call bell/phone within reach;in bed;with bed alarm set   PT Visit Diagnosis: Other abnormalities of gait and mobility (R26.89);History of falling (Z91.81)    Time: 7846-9629 PT Time Calculation (min) (ACUTE ONLY): 18 min   Charges:   PT Evaluation $PT Eval Low Complexity: 1 Low   PT General Charges $$ ACUTE PT VISIT: 1 Visit         , PT  Acute Rehab Dept [facility]) [phone]  02/14/2023   [facility] 02/14/2023, 3:27 PM

## 2023-02-14 NOTE — Plan of Care (Signed)
  Problem: Education: Goal: Knowledge of General Education information will improve Description: Including pain rating scale, medication(s)/side effects and non-pharmacologic comfort measures Outcome: Progressing   Problem: Health Behavior/Discharge Planning: Goal: Ability to manage health-related needs will improve Outcome: Progressing   Problem: Clinical Measurements: Goal: Ability to maintain clinical measurements within normal limits will improve Outcome: Progressing   Problem: Nutrition: Goal: Adequate nutrition will be maintained Outcome: Progressing   Problem: Coping: Goal: Level of anxiety will decrease Outcome: Progressing   Problem: Elimination: Goal: Will not experience complications related to bowel motility Outcome: Progressing   Problem: Pain Managment: Goal: General experience of comfort will improve Outcome: Progressing   Problem: Safety: Goal: Ability to remain free from injury will improve Outcome: Progressing   

## 2023-02-15 DIAGNOSIS — I10 Essential (primary) hypertension: Secondary | ICD-10-CM

## 2023-02-15 DIAGNOSIS — Z8673 Personal history of transient ischemic attack (TIA), and cerebral infarction without residual deficits: Secondary | ICD-10-CM | POA: Diagnosis not present

## 2023-02-15 DIAGNOSIS — N400 Enlarged prostate without lower urinary tract symptoms: Secondary | ICD-10-CM | POA: Diagnosis not present

## 2023-02-15 DIAGNOSIS — S32030A Wedge compression fracture of third lumbar vertebra, initial encounter for closed fracture: Secondary | ICD-10-CM | POA: Diagnosis not present

## 2023-02-15 LAB — BASIC METABOLIC PANEL WITH GFR
Anion gap: 7 (ref 5–15)
BUN: 33 mg/dL — ABNORMAL HIGH (ref 8–23)
CO2: 23 mmol/L (ref 22–32)
Calcium: 8.2 mg/dL — ABNORMAL LOW (ref 8.9–10.3)
Chloride: 99 mmol/L (ref 98–111)
Creatinine, Ser: 0.63 mg/dL (ref 0.61–1.24)
GFR, Estimated: >60 mL/min (ref >60)
Glucose, Bld: 226 mg/dL — ABNORMAL HIGH (ref 70–99)
Potassium: 4.2 mmol/L (ref 3.5–5.1)
Sodium: 129 mmol/L — ABNORMAL LOW (ref 135–145)

## 2023-02-15 LAB — CBC
HCT: 45.6 % (ref 39.0–52.0)
Hemoglobin: 15.3 g/dL (ref 13.0–17.0)
MCH: 32.7 pg (ref 26.0–34.0)
MCHC: 33.6 g/dL (ref 30.0–36.0)
MCV: 97.4 fL (ref 80.0–100.0)
Platelets: 118 10*3/uL — ABNORMAL LOW (ref 150–400)
RBC: 4.68 MIL/uL (ref 4.22–5.81)
RDW: 15.7 % — ABNORMAL HIGH (ref 11.5–15.5)
WBC: 10.7 10*3/uL — ABNORMAL HIGH (ref 4.0–10.5)
nRBC: 0 % (ref 0.0–0.2)

## 2023-02-15 MED ORDER — TAMSULOSIN HCL 0.4 MG PO CAPS
0.4000 mg | ORAL_CAPSULE | Freq: Two times a day (BID) | ORAL | Status: DC
  Administered 2023-02-15 – 2023-02-23 (×16): 0.4 mg via ORAL
  Filled 2023-02-15 (×16): qty 1

## 2023-02-15 NOTE — Progress Notes (Signed)
Howard Jackson                                                                              Howard Jackson, is a 77 y.o. male, DOB - 1946/06/11, [id number] Admit date - 02/12/2023    Outpatient Primary MD for the patient is [facility] [employer]  LOS - 3  days  Chief Complaint  Patient presents with   Fall       Brief summary   Patient is a 77 year old male with CVA, residual left-sided hemiparesis, HTN, HLD, anxiety, PAD, primary hyper aldosteronism presented to ED with low back pain after a fall.  Patient ported that he lives alone, wheelchair-bound, in usual state of health until the morning of admission when he sustained a mechanical fall landing on his tailbone. Of note, patient was hospitalized in 6/24 for headache and temporal arteritis workup and has been on steroids for that.   At baseline he is able to rise from sitting and pivot into wheelchair.  He does follow with neurosurgery clinic CT lumbar spine showed acute superior endplate compression deformity at anterior aspect of L3, age-indeterminate   Assessment & Plan    Principal Problem: Mechanical fall with intractable back pain secondary to L3  compression fracture (HCC) -Patient lives alone, wheelchair-bound, chronic left-sided weakness from previous stroke -MRI lumbar spine showed acute L3 compression fracture with slight height loss. -Continue pain control, PT OT evaluation -PT eval 8/3 recommended SNF -IR consulted for vertebroplasty, recommended to hold off Plavix for 5 days, tentative plan on Friday 8/9.  Discussed with IR, can schedule the procedure outpatient -TOC consulted for SNF, pending bed availability will assess if vertebroplasty inpatient versus outpatient.  Active Problems: Mild hyponatremia -Appears to have chronic hyponatremia -Urine osmolality 567, urine sodium 145, serum osmolarity 274, likely has SIADH -Na improving, continue salt tabs    HTN  (hypertension) -BP stable, continue labetalol, nifedipine    History of stroke with left-sided hemiparesis -Continue BP control -Plavix held for pending vertebroplasty on Friday 8/9  History of headaches and recent temporal arteritis -Continue prednisone    BPH (benign prostatic hyperplasia) -Continue Proscar, tamsulosin    Hypercholesterolemia -Continue Crestor    Chronic diastolic CHF (congestive heart failure) (HCC) -Euvolemic, no acute issues, no recent echocardiogram  Primary hyper aldosteronism -Continue Aldactone   Estimated body mass index is 20.2 kg/m as calculated from the following:   Height as of this encounter: 5\' 7"  (1.702 m).   Weight as of this encounter: 58.5 kg.  Code Status: Full code DVT Prophylaxis:  enoxaparin (LOVENOX) injection 40 mg Start: 02/12/23 2200 SCDs Start: 02/12/23 1756   Level of Care: Level of care: Med-Surg Family Communication: Updated patient Disposition Plan:      Remains inpatient appropriate: Pending SNF bed availability   Procedures:  None  Consultants:   IR  Antimicrobials:   Anti-infectives (From admission, onward)    None          Medications  acidophilus  2 capsule Oral Daily   cholecalciferol  5,000 Units Oral QODAY   docusate sodium  100 mg Oral BID  enoxaparin (LOVENOX) injection  40 mg Subcutaneous Q24H   feeding supplement  237 mL Oral BID BM   finasteride  5 mg Oral Daily   ketorolac  15 mg Intravenous Q8H   labetalol  300 mg Oral BID   NIFEdipine  60 mg Oral QPM   NIFEdipine  30 mg Oral Daily   pantoprazole  40 mg Oral Daily   polyethylene glycol  17 g Oral Daily   predniSONE  60 mg Oral Q breakfast   rosuvastatin  20 mg Oral Daily   sodium chloride flush  3 mL Intravenous Q12H   sodium chloride  1 g Oral BID WC   spironolactone  100 mg Oral Daily   tamsulosin  0.4 mg Oral BID      Subjective:   Howard Jackson was seen and examined today.  Back pain is better controlled however  patient is very apprehensive of going home as he lives alone, wheelchair-bound and does not have much support.  No acute nausea vomiting chest pain or shortness of breath.  Objective:   Vitals:   02/14/23 1016 02/14/23 1230 02/14/23 2129 02/15/23 0537  BP: 127/72 114/73 130/66 133/65  Pulse: 82 75 66 68  Resp: 17 17    Temp: 99.1 F (37.3 C) 98 F (36.7 C) (!) 97.5 F (36.4 C) 98.7 F (37.1 C)  TempSrc: Oral Oral Oral Oral  SpO2: 100% 99% 100% 98%  Weight:      Height:        Intake/Output Summary (Last 24 hours) at 02/15/2023 1302 Last data filed at 02/15/2023 1100 Gross per 24 hour  Intake 840 ml  Output 1170 ml  Net -330 ml     Wt Readings from Last 3 Encounters:  02/12/23 58.5 kg  12/17/22 56.3 kg  12/03/22 59 kg    Physical Exam General: Alert and oriented x 3, NAD Cardiovascular: S1 S2 clear, RRR.  Respiratory: CTAB, no wheezing Gastrointestinal: Soft, nontender, nondistended, NBS Ext: no pedal edema bilaterally Neuro: left-sided hemiparesis from previous stroke Psych: Normal affect   Data Reviewed:  I have personally reviewed following labs    CBC Lab Results  Component Value Date   WBC 10.7 (H) 02/15/2023   RBC 4.68 02/15/2023   HGB 15.3 02/15/2023   HCT 45.6 02/15/2023   MCV 97.4 02/15/2023   MCH 32.7 02/15/2023   PLT 118 (L) 02/15/2023   MCHC 33.6 02/15/2023   RDW 15.7 (H) 02/15/2023   LYMPHSABS 1.2 12/17/2022   MONOABS 1.0 12/17/2022   EOSABS 0.1 12/17/2022   BASOSABS 0.0 12/17/2022     Last metabolic panel Lab Results  Component Value Date   NA 129 (L) 02/15/2023   K 4.2 02/15/2023   CL 99 02/15/2023   CO2 23 02/15/2023   BUN 33 (H) 02/15/2023   CREATININE 0.63 02/15/2023   GLUCOSE 226 (H) 02/15/2023   GFRNONAA >60 02/15/2023   GFRAA 64 12/16/2019   CALCIUM 8.2 (L) 02/15/2023   PHOS 3.5 12/18/2022   PROT 5.0 (L) 02/13/2023   ALBUMIN 3.0 (L) 02/13/2023   BILITOT 1.0 02/13/2023   ALKPHOS 46 02/13/2023   AST 19 02/13/2023   ALT  39 02/13/2023   ANIONGAP 7 02/15/2023    CBG (last 3)  No results for input(s): "GLUCAP" in the last 72 hours.    Coagulation Profile: No results for input(s): "INR", "PROTIME" in the last 168 hours.   Radiology Studies: I have personally reviewed the imaging studies  MR LUMBAR SPINE WO  CONTRAST  Result Date: 02/13/2023 CLINICAL DATA:  Lumbar compression fracture. EXAM: MRI LUMBAR SPINE WITHOUT CONTRAST TECHNIQUE: Multiplanar, multisequence MR imaging of the lumbar spine was performed. No intravenous contrast was administered. COMPARISON:  Lumbar CT from yesterday FINDINGS: Segmentation:  Standard. Alignment:  Mild scoliosis and L3-4 anterolisthesis. Vertebrae: Cores on tool band of marrow edema in the L3 body following a hypointense fracture plane. Height loss is minimal and non progressed. No evidence of bone lesion. Conus medullaris and cauda equina: Conus extends to the L1-2 level. Conus and cauda equina appear normal. Paraspinal and other soft tissues: Negative for perispinal mass or inflammation. Asymmetric atrophy of the left psoas. Disc levels: T12- L1: Unremarkable. L1-L2: Unremarkable. L2-L3: Foraminal predominant disc bulging with posterior annular fissure. Negative facets. L3-L4: Degenerative facet spurring with mild anterolisthesis. The disc is circumferentially bulging with biforaminal protrusion. Mild annular fissuring. A left foraminal protrusion indents the left L3 nerve root. L4-L5: Disc narrowing and bulging with left foraminal protrusion. Degenerative facet spurring on both sides. Mild to moderate spinal stenosis. The left foraminal disc protrusion protrusion indents the left L4 nerve root. L5-S1:Bulging disc with right foraminal protrusion causing moderate foraminal stenosis. Mild contribution from facet spurring on the right. IMPRESSION: 1. Acute L3 compression fracture with slight height loss. 2. Generalized lumbar spine degeneration with scoliosis and mild L3-4 anterolisthesis.  3. Moderate foraminal narrowing on the right at L5-S1 and left at L3-4, L4-5. 4. Mild to moderate spinal stenosis at L4-5. Electronically Signed   By: Tiburcio Pea M.D.   On: 02/13/2023 18:06         M.D. Howard Jackson 02/15/2023, 1:02 PM  Available via Epic secure chat 7am-7pm After 7 pm, please refer to night coverage provider listed on amion.

## 2023-02-16 DIAGNOSIS — Z8673 Personal history of transient ischemic attack (TIA), and cerebral infarction without residual deficits: Secondary | ICD-10-CM | POA: Diagnosis not present

## 2023-02-16 DIAGNOSIS — S32030A Wedge compression fracture of third lumbar vertebra, initial encounter for closed fracture: Secondary | ICD-10-CM | POA: Diagnosis not present

## 2023-02-16 DIAGNOSIS — W19XXXA Unspecified fall, initial encounter: Secondary | ICD-10-CM | POA: Diagnosis not present

## 2023-02-16 DIAGNOSIS — I5032 Chronic diastolic (congestive) heart failure: Secondary | ICD-10-CM

## 2023-02-16 LAB — PROCALCITONIN: Procalcitonin: <0.1 ng/mL

## 2023-02-16 MED ORDER — SORBITOL 70 % SOLN
960.0000 mL | TOPICAL_OIL | Freq: Once | ORAL | Status: AC
  Administered 2023-02-16: 960 mL via RECTAL
  Filled 2023-02-16: qty 240

## 2023-02-16 MED ORDER — GERHARDT'S BUTT CREAM: TOPICAL_CREAM | CUTANEOUS | Status: DC | PRN

## 2023-02-16 NOTE — Progress Notes (Signed)
[place] from [facility]: [phone]  Please update once pt's discharge plan is finalized.

## 2023-02-16 NOTE — TOC Initial Note (Signed)
Transition of Care [facility]) - Initial/Assessment Note    Patient Details  Name: Howard Jackson MRN: [id number] Date of Birth: 13-Jul-1946  [facility]) CM/SW Contact:    Amada Jupiter, LCSW Phone Number: 02/16/2023, 3:10 PM  Clinical Narrative:                  Met with pt today to begin discussion re: dc planning needs.  Pt very pleasant but admits frustration with current situation and pain.  He confirms that he lives alone in a wc accessible apartment and was wc level independent with mobility and transfers prior to this fall.  He attends the [place] day program on Thursdays and has aide assistance a few hours each week.  We discussed likely need for SNF rehab prior to his return home and he is in full agreement.  Pt aware that plan is for kyphoplasty to be done on Friday.  At this point, will await KP and then begin SNF bed search.  Have left a VM at [facility] for a case manager to contact TOC to discuss plans further.  Expected Discharge Plan: Skilled Nursing Facility Barriers to Discharge: Continued Medical Work up, SNF Pending bed offer   Patient Goals and CMS Choice Patient states their goals for this hospitalization and ongoing recovery are:: return home following SNF rehab          Expected Discharge Plan and Services In-house Referral: Clinical Social Work   Post Acute Care Choice: Skilled Nursing Facility Living arrangements for the past 2 months: Single Family Home                                      Prior Living Arrangements/Services Living arrangements for the past 2 months: Single Family Home Lives with:: Self Patient language and need for interpreter reviewed:: Yes Do you feel safe going back to the place where you live?: Yes      Need for Family Participation in Patient Care: Yes (Comment) Care giver support system in place?: No (comment) Current home services: Homehealth aide    Activities of Daily Living Home Assistive  Devices/Equipment: Wheelchair, Shower chair with back, Transfer board ADL Screening (condition at time of admission) Patient's cognitive ability adequate to safely complete daily activities?: Yes Is the patient deaf or have difficulty hearing?: No Does the patient have difficulty seeing, even when wearing glasses/contacts?: No Does the patient have difficulty concentrating, remembering, or making decisions?: No Patient able to express need for assistance with ADLs?: Yes Does the patient have difficulty dressing or bathing?: Yes Independently performs ADLs?: No Communication: Independent Dressing (OT): Needs assistance Is this a change from baseline?: Pre-admission baseline Grooming: Needs assistance Is this a change from baseline?: Pre-admission baseline Bathing: Needs assistance Is this a change from baseline?: Pre-admission baseline Toileting: Needs assistance Is this a change from baseline?: Pre-admission baseline In/Out Bed: Independent with device (comment) Walks in Home: Independent with device (comment) Does the patient have difficulty walking or climbing stairs?: Yes Weakness of Legs: Both Weakness of Arms/Hands: Left  Permission Sought/Granted Permission sought to share information with : Family Supports, Magazine features editor Permission granted to share information with : Yes, Verbal Permission Granted  Share Information with NAME: daughter, Scottie Seraphin [phone]  Permission granted to share info w AGENCY: SNFs; [facility]        Emotional Assessment Appearance:: Appears stated age Attitude/Demeanor/Rapport:  Gracious, Engaged Affect (typically observed): Accepting Orientation: : Oriented to Self, Oriented to Place, Oriented to  Time, Oriented to Situation Alcohol / Substance Use: Not Applicable Psych Involvement: No (comment)  Admission diagnosis:  Lumbar compression fracture (HCC) [S32.000A] Fall, initial encounter [W19.XXXA] Compression fracture of  L3 vertebra, initial encounter (HCC) [S32.030A] Patient Active Problem List   Diagnosis Date Noted   Lumbar compression fracture (HCC) 02/12/2023   Malnutrition of moderate degree 12/18/2022   Temporal arteritis (HCC) 12/17/2022   Chronic diastolic CHF (congestive heart failure) (HCC) 12/17/2022   Hyponatremia 12/17/2022   Tooth infection 12/17/2022   Hypercholesterolemia 06/20/2018   Left spastic hemiparesis (HCC) 06/20/2018   Fall 04/14/2018   Fracture of femoral neck, left, closed (HCC) 04/14/2018   Closed displaced fracture of left femoral neck (HCC) 04/14/2018   Bunion    Hypertension due to endocrine disorder 03/07/2017   BPH (benign prostatic hyperplasia) 11/25/2016   Chronic bilateral thoracic back pain 08/20/2016   Mild intermittent asthma 06/18/2016   Wheezing 06/18/2016   Cough 06/18/2016   Left flank pain 04/18/2015   Benign pheochromocytoma of right adrenal gland 06/23/2014   PAD (peripheral artery disease) (HCC) 03/31/2013   HTN (hypertension) 03/31/2013   History of stroke 03/31/2013   Coronary artery disease involving native coronary artery of native heart without angina pectoris 03/31/2013   Renal artery stenosis (HCC) 03/31/2013   Erectile dysfunction 03/31/2013   Dyslipidemia 03/31/2013   Carotid stenosis 03/31/2013   Left inguinal hernia 09/22/2012   PCP:  [facility]:   [id number] [place], [place] - [address] AT [facility] & [address] [place] [address] Phone: [phone] Fax: [phone]     Social Determinants of Health (SDOH) Social History: SDOH Screenings   Food Insecurity: Food Insecurity Present (02/12/2023)  Housing: Low Risk  (02/12/2023)  Transportation Needs: No Transportation Needs (02/12/2023)  Utilities: Not At Risk (02/12/2023)  Social Connections: Unknown (11/25/2021)   Received from [facility]  Tobacco Use:  Low Risk  (02/12/2023)   SDOH Interventions:     Readmission Risk Interventions    02/16/2023    3:07 PM  Readmission Risk Prevention Plan  Transportation Screening Complete  PCP or Specialist Appt within 3-5 Days Complete  HRI or Home Care Consult Complete  Social Work Consult for Recovery Care Planning/Counseling Complete  Palliative Care Screening Not Applicable  Medication Review Oceanographer) Complete

## 2023-02-16 NOTE — Care Management Important Message (Signed)
Important Message  Patient Details IM Letter given. Name: Howard Jackson MRN: [id number] Date of Birth: [birth year]   Medicare Important Message Given:  Yes     Caren Macadam 02/16/2023, 12:17 PM

## 2023-02-16 NOTE — Progress Notes (Signed)
[name]                                                                              Jake Irigoyen, is a 77 y.o. male, DOB - 11/06/1945, [id number] Admit date - 02/12/2023    Outpatient Primary MD for the patient is [facility] [employer]  LOS - 4  days  Chief Complaint  Patient presents with   Fall       Brief summary   Patient is a 77 year old male with CVA, residual left-sided hemiparesis, HTN, HLD, anxiety, PAD, primary hyper aldosteronism presented to ED with low back pain after a fall.  Patient ported that he lives alone, wheelchair-bound, in usual state of health until the morning of admission when he sustained a mechanical fall landing on his tailbone. Of note, patient was hospitalized in 6/24 for headache and temporal arteritis workup and has been on steroids for that.   At baseline he is able to rise from sitting and pivot into wheelchair.  He does follow with neurosurgery clinic CT lumbar spine showed acute superior endplate compression deformity at anterior aspect of L3, age-indeterminate   Assessment & Plan    Principal Problem: Mechanical fall with intractable back pain secondary to L3  compression fracture (HCC) -Patient lives alone, wheelchair-bound, chronic left-sided weakness from previous stroke -MRI lumbar spine showed acute L3 compression fracture with slight height loss. -Continue pain control, PT OT evaluation -PT eval 8/3 recommended SNF -IR consulted for vertebroplasty, recommended to hold off Plavix for 5 days, tentative plan on Friday 8/9.  Discussed with IR, can schedule the procedure outpatient.  -TOC consulted for SNF, pending bed availability will assess if vertebroplasty inpatient versus outpatient.  Active Problems: Mild hyponatremia -Appears to have chronic hyponatremia -Urine osmolality 567, urine sodium 145, serum osmolarity 274, likely has SIADH -Stable, continue salt tabs    HTN  (hypertension) -BP stable, continue labetalol, nifedipine    History of stroke with left-sided hemiparesis -BP stable, continue antihypertensives -Plavix held for pending vertebroplasty on Friday 8/9  History of headaches and recent temporal arteritis -Continue prednisone    BPH (benign prostatic hyperplasia) -Continue Proscar, tamsulosin    Hypercholesterolemia -Continue Crestor    Chronic diastolic CHF (congestive heart failure) (HCC) -Euvolemic, no acute issues, no recent echocardiogram  Primary hyper aldosteronism -Continue Aldactone    Estimated body mass index is 20.2 kg/m as calculated from the following:   Height as of this encounter: 5\' 7"  (1.702 m).   Weight as of this encounter: 58.5 kg.  Code Status: Full code DVT Prophylaxis:  enoxaparin (LOVENOX) injection 40 mg Start: 02/12/23 2200 SCDs Start: 02/12/23 1756   Level of Care: Level of care: Med-Surg Family Communication: Updated patient's daughter, Fread Ackroyd on phone Disposition Plan:      Remains inpatient appropriate: Pending SNF bed availability vs HHPT.  PT evaluation recommended SNF, patient prefers going home, wheelchair-bound, lives alone.  Discussed in detail with the patient's daughter, who feels patient is having memory issues and has been having falls recently, does not feel he is safe at home alone.  Updated TOC.   Procedures:  None  Consultants:   IR  Antimicrobials:   Anti-infectives (From admission, onward)    None          Medications  acidophilus  2 capsule Oral Daily   cholecalciferol  5,000 Units Oral QODAY   docusate sodium  100 mg Oral BID   enoxaparin (LOVENOX) injection  40 mg Subcutaneous Q24H   feeding supplement  237 mL Oral BID BM   finasteride  5 mg Oral Daily   ketorolac  15 mg Intravenous Q8H   labetalol  300 mg Oral BID   NIFEdipine  60 mg Oral QPM   NIFEdipine  30 mg Oral Daily   pantoprazole  40 mg Oral Daily   polyethylene glycol  17 g Oral Daily    predniSONE  60 mg Oral Q breakfast   rosuvastatin  20 mg Oral Daily   sodium chloride flush  3 mL Intravenous Q12H   sodium chloride  1 g Oral BID WC   spironolactone  100 mg Oral Daily   tamsulosin  0.4 mg Oral BID      Subjective:   Kerion Wiebelhaus was seen and examined today.  No acute complaints.  Back pain currently controlled.  No nausea vomiting, chest pain or shortness of breath.  No fevers.   Objective:   Vitals:   02/15/23 0537 02/15/23 1326 02/15/23 2144 02/16/23 0545  BP: 133/65 128/72 126/61 128/68  Pulse: 68 80 76 61  Resp:  17 18 16   Temp: 98.7 F (37.1 C) 97.8 F (36.6 C) 97.8 F (36.6 C) 98 F (36.7 C)  TempSrc: Oral Oral Oral   SpO2: 98% 95% 97% 97%  Weight:      Height:        Intake/Output Summary (Last 24 hours) at 02/16/2023 1151 Last data filed at 02/16/2023 1108 Gross per 24 hour  Intake 720 ml  Output 650 ml  Net 70 ml     Wt Readings from Last 3 Encounters:  02/12/23 58.5 kg  12/17/22 56.3 kg  12/03/22 59 kg   Physical Exam General: Alert and oriented x 3, NAD Cardiovascular: S1 S2 clear, RRR.  Respiratory: CTAB Gastrointestinal: Soft, nontender, nondistended, NBS Ext: no pedal edema bilaterally Neuro: no new deficits, chronic left-sided weakness Psych: Normal affect   Data Reviewed:  I have personally reviewed following labs    CBC Lab Results  Component Value Date   WBC 16.1 (H) 02/16/2023   RBC 4.63 02/16/2023   HGB 14.6 02/16/2023   HCT 44.5 02/16/2023   MCV 96.1 02/16/2023   MCH 31.5 02/16/2023   PLT 115 (L) 02/16/2023   MCHC 32.8 02/16/2023   RDW 15.5 02/16/2023   LYMPHSABS 1.2 12/17/2022   MONOABS 1.0 12/17/2022   EOSABS 0.1 12/17/2022   BASOSABS 0.0 12/17/2022     Last metabolic panel Lab Results  Component Value Date   NA 128 (L) 02/16/2023   K 4.2 02/16/2023   CL 97 (L) 02/16/2023   CO2 22 02/16/2023   BUN 29 (H) 02/16/2023   CREATININE 0.68 02/16/2023   GLUCOSE 190 (H) 02/16/2023   GFRNONAA >60  02/16/2023   GFRAA 64 12/16/2019   CALCIUM 8.2 (L) 02/16/2023   PHOS 3.5 12/18/2022   PROT 5.0 (L) 02/13/2023   ALBUMIN 3.0 (L) 02/13/2023   BILITOT 1.0 02/13/2023   ALKPHOS 46 02/13/2023   AST 19 02/13/2023   ALT 39 02/13/2023   ANIONGAP 9 02/16/2023    CBG (last 3)  No results for input(s): "  GLUCAP" in the last 72 hours.    Coagulation Profile: No results for input(s): "INR", "PROTIME" in the last 168 hours.   Radiology Studies: I have personally reviewed the imaging studies  No results found.     Thad Ranger M.D. [name] 02/16/2023, 11:51 AM  Available via Epic secure chat 7am-7pm After 7 pm, please refer to night coverage provider listed on amion.

## 2023-02-16 NOTE — Plan of Care (Signed)

## 2023-02-17 ENCOUNTER — Inpatient Hospital Stay (HOSPITAL_COMMUNITY): Payer: Medicare (Managed Care)

## 2023-02-17 DIAGNOSIS — W19XXXA Unspecified fall, initial encounter: Secondary | ICD-10-CM | POA: Diagnosis not present

## 2023-02-17 DIAGNOSIS — S32030A Wedge compression fracture of third lumbar vertebra, initial encounter for closed fracture: Secondary | ICD-10-CM | POA: Diagnosis not present

## 2023-02-17 DIAGNOSIS — I5032 Chronic diastolic (congestive) heart failure: Secondary | ICD-10-CM | POA: Diagnosis not present

## 2023-02-17 DIAGNOSIS — E871 Hypo-osmolality and hyponatremia: Secondary | ICD-10-CM

## 2023-02-17 DIAGNOSIS — Z8673 Personal history of transient ischemic attack (TIA), and cerebral infarction without residual deficits: Secondary | ICD-10-CM | POA: Diagnosis not present

## 2023-02-17 LAB — BASIC METABOLIC PANEL
Anion gap: 8 (ref 5–15)
BUN: 29 mg/dL — ABNORMAL HIGH (ref 8–23)
CO2: 24 mmol/L (ref 22–32)
Calcium: 8.6 mg/dL — ABNORMAL LOW (ref 8.9–10.3)
Chloride: 95 mmol/L — ABNORMAL LOW (ref 98–111)
Creatinine, Ser: 0.63 mg/dL (ref 0.61–1.24)
GFR, Estimated: >60 mL/min (ref >60)
Glucose, Bld: 119 mg/dL — ABNORMAL HIGH (ref 70–99)
Potassium: 4.6 mmol/L (ref 3.5–5.1)
Sodium: 127 mmol/L — ABNORMAL LOW (ref 135–145)

## 2023-02-17 LAB — CBC WITH DIFFERENTIAL/PLATELET
Abs Immature Granulocytes: 0.98 10*3/uL — ABNORMAL HIGH (ref 0.00–0.07)
Basophils Absolute: 0.1 10*3/uL (ref 0.0–0.1)
Basophils Relative: 1 %
Eosinophils Absolute: 0 10*3/uL (ref 0.0–0.5)
Eosinophils Relative: 0 %
HCT: 48.1 % (ref 39.0–52.0)
Hemoglobin: 16.2 g/dL (ref 13.0–17.0)
Immature Granulocytes: 8 %
Lymphocytes Relative: 8 %
Lymphs Abs: 1 10*3/uL (ref 0.7–4.0)
MCH: 31.9 pg (ref 26.0–34.0)
MCHC: 33.7 g/dL (ref 30.0–36.0)
MCV: 94.7 fL (ref 80.0–100.0)
Monocytes Absolute: 0.9 10*3/uL (ref 0.1–1.0)
Monocytes Relative: 8 %
Neutro Abs: 9.4 10*3/uL — ABNORMAL HIGH (ref 1.7–7.7)
Neutrophils Relative %: 75 %
Platelets: 121 10*3/uL — ABNORMAL LOW (ref 150–400)
RBC: 5.08 MIL/uL (ref 4.22–5.81)
RDW: 15.6 % — ABNORMAL HIGH (ref 11.5–15.5)
WBC: 12.3 10*3/uL — ABNORMAL HIGH (ref 4.0–10.5)
nRBC: 0 % (ref 0.0–0.2)

## 2023-02-17 LAB — PROTIME-INR
INR: 1 (ref 0.8–1.2)
Prothrombin Time: 13.1 seconds (ref 11.4–15.2)

## 2023-02-17 MED ORDER — PSEUDOEPHEDRINE HCL 15 MG/5ML PO LIQD
30.0000 mg | Freq: Once | ORAL | Status: AC
  Administered 2023-02-17: 30 mg via ORAL
  Filled 2023-02-17: qty 10

## 2023-02-17 MED ORDER — LORATADINE 10 MG PO TABS
10.0000 mg | ORAL_TABLET | Freq: Every day | ORAL | Status: DC
  Administered 2023-02-18 – 2023-02-19 (×2): 10 mg via ORAL
  Filled 2023-02-17 (×3): qty 1

## 2023-02-17 MED ORDER — ENSURE MAX PROTEIN PO LIQD
11.0000 [oz_av] | Freq: Two times a day (BID) | ORAL | Status: DC
  Administered 2023-02-17 – 2023-02-18 (×2): 11 [oz_av] via ORAL
  Filled 2023-02-17 (×4): qty 330

## 2023-02-17 MED ORDER — FLUTICASONE PROPIONATE 50 MCG/ACT NA SUSP
2.0000 | Freq: Every day | NASAL | Status: DC
  Filled 2023-02-17: qty 16

## 2023-02-17 NOTE — Progress Notes (Signed)
Initial Nutrition Assessment  DOCUMENTATION CODES:   Non-severe (moderate) malnutrition in context of chronic illness  INTERVENTION:  - Liberalize to a Regular diet.  - Ensure Max po BID, each supplement provides 150 kcal and 30 grams of protein.  - Monitor weight trends.   NUTRITION DIAGNOSIS:   Moderate Malnutrition related to chronic illness as evidenced by mild fat depletion, mild muscle depletion.  GOAL:   Patient will meet greater than or equal to 90% of their needs  MONITOR:   PO intake, Supplement acceptance, Weight trends  REASON FOR ASSESSMENT:   Malnutrition Screening Tool    ASSESSMENT:   77 year old male with PMH CVA, residual left-sided hemiparesis, HTN, HLD, anxiety, PAD, primary hyper aldosteronism who presented to ED with low back pain after a fall. Patient reported that he lives alone and is wheelchair-bound. Admitted for intractable back pain secondary to L3 compression fracture.   Patient endorses UBW of 160-170# and weight loss that started around 2 years ago. Per EMR, patient weighed at 153# in July 2022 but there is a gap in weights in 2023. Only weight history over the past year starts in March, and weight has been mostly stable since that time.  Patient endorses eating fairly at home. Doesn't drink any supplements.  States he has been unable to eat very well this admission due to not liking the food (seasoning). Despite his report, he is documented to be consuming 75-100% of meals with average of 94%. He is agreeable to have his diet changed to Regular as his sodium has been low and he has even been recommended salt tabs (although has been refusing them).  He does not like Ensure due to too much sugar. Agreeable to try Ensure Max to support intake.    Medications reviewed and include: 5000 units vitamin D, Colace, Miralax  Labs reviewed:  Na 127   NUTRITION - FOCUSED PHYSICAL EXAM:  Flowsheet Row Most Recent Value  Orbital Region Mild  depletion  Upper Arm Region Moderate depletion  Thoracic and Lumbar Region Mild depletion  Buccal Region No depletion  Temple Region Moderate depletion  Clavicle Bone Region Mild depletion  Clavicle and Acromion Bone Region Mild depletion  Scapular Bone Region Unable to assess  Dorsal Hand Mild depletion  Patellar Region Severe depletion [patient reports being wheelchair bound]  Anterior Thigh Region Severe depletion [patient reports being wheelchair bound]  Posterior Calf Region Severe depletion [patient reports being wheelchair bound]  Edema (RD Assessment) None  Hair Reviewed  Eyes Reviewed  Mouth Reviewed  Skin Reviewed  Nails Reviewed       Diet Order:   Diet Order             Diet regular Room service appropriate? Yes; Fluid consistency: Thin  Diet effective now                   EDUCATION NEEDS:  Education needs have been addressed  Skin:  Skin Assessment: Reviewed RN Assessment  Last BM:  8/6  Height:  Ht Readings from Last 1 Encounters:  02/12/23 5\' 7"  (1.702 m)   Weight:  Wt Readings from Last 1 Encounters:  02/12/23 58.5 kg    BMI:  Body mass index is 20.2 kg/m.  Estimated Nutritional Needs:  Kcal:  1750-1950 kcals Protein:  70-90 grams Fluid:  >/= 1.8L    Shelle Iron RD, LDN For contact information, refer to [facility].

## 2023-02-17 NOTE — TOC Progression Note (Signed)
Transition of Care [facility]) - Progression Note   Patient Details  Name: Howard Jackson MRN: [id number] Date of Birth: October 31, 1945  [facility] CM/SW Contact  Ewing Schlein, LCSW Phone Number: 02/17/2023, 2:15 PM  Clinical Narrative: Patient is agreeable to SNF. CSW spoke with [place] at [facility]. Patient can be faxed to [facility] and [place]. FL2 done; PASRR confirmed. Initial referral faxed out. Food pantry resources added to AVS. TOC awaiting bed offers.    Expected Discharge Plan: Skilled Nursing Facility Barriers to Discharge: Continued Medical Work up, SNF Pending bed offer  Expected Discharge Plan and Services In-house Referral: Clinical Social Work Post Acute Care Choice: Skilled Nursing Facility Living arrangements for the past 2 months: Single Family Home  Social Determinants of Health (SDOH) Interventions SDOH Screenings   Food Insecurity: Food Insecurity Present (02/12/2023)  Housing: Low Risk  (02/12/2023)  Transportation Needs: No Transportation Needs (02/12/2023)  Utilities: Not At Risk (02/12/2023)  Social Connections: Unknown (11/25/2021)   Received from [facility], [facility]  Tobacco Use: Low Risk  (02/12/2023)   Readmission Risk Interventions    02/16/2023    3:07 PM  Readmission Risk Prevention Plan  Transportation Screening Complete  PCP or Specialist Appt within 3-5 Days Complete  HRI or Home Care Consult Complete  Social Work Consult for Recovery Care Planning/Counseling Complete  Palliative Care Screening Not Applicable  Medication Review [employer]) Complete

## 2023-02-17 NOTE — Progress Notes (Signed)
[name]                                                                              Howard Jackson, is a 77 y.o. male, DOB - December 14, 1945, ZOX:[id number] Admit date - 02/12/2023    Outpatient Primary MD for the patient is [facility] [address] [facility] [employer]  LOS - 5  days  Chief Complaint  Patient presents with   Fall       Brief summary   Patient is a 77 year old male with CVA, residual left-sided hemiparesis, HTN, HLD, anxiety, PAD, primary hyper aldosteronism presented to ED with low back pain after a fall.  Patient ported that he lives alone, wheelchair-bound, in usual state of health until the morning of admission when he sustained a mechanical fall landing on his tailbone. Of note, patient was hospitalized in 6/24 for headache and temporal arteritis workup and has been on steroids for that.   At baseline he is able to rise from sitting and pivot into wheelchair.  He does follow with neurosurgery clinic CT lumbar spine showed acute superior endplate compression deformity at anterior aspect of L3, age-indeterminate   Assessment & Plan    Principal Problem: Mechanical fall with intractable back pain secondary to L3  compression fracture (HCC) -Patient lives alone, wheelchair-bound, chronic left-sided weakness from previous stroke -MRI lumbar spine showed acute L3 compression fracture with slight height loss. -Continue pain control, PT OT evaluation -PT eval 8/3 recommended SNF -IR consulted for vertebroplasty, recommended to hold off Plavix for 5 days, tentative plan on Friday 8/9.  Discussed with IR, can schedule the procedure outpatient.  -TOC consulted for SNF, pending bed availability will assess if vertebroplasty inpatient versus outpatient.  Active Problems: Mild hyponatremia -Appears to have chronic hyponatremia -Urine osmolality 567, urine sodium 145, serum osmolarity 274, likely has SIADH -Placed on salt tablets however patient continues  to refuse salt tabs -Sodium improved to 129 and has started trending down, alert and oriented x 3, counseled to make Cesol to tabs with meals.    HTN (hypertension) -BP stable, continue labetalol, nifedipine    History of stroke with left-sided hemiparesis -BP stable, continue antihypertensives -Plavix held for pending vertebroplasty on Friday 8/9  History of headaches and recent temporal arteritis -Continue prednisone    BPH (benign prostatic hyperplasia) -Continue Proscar, tamsulosin    Hypercholesterolemia -Continue Crestor    Chronic diastolic CHF (congestive heart failure) (HCC) -Euvolemic, no acute issues, no recent echocardiogram  Primary hyper aldosteronism -Continue Aldactone 100 mg daily  Cough and congestion -Today complaining of cough and congestion, placed on Claritin, Flonase spray, Sudafed 30 mg x 1 -Chest x-ray  Estimated body mass index is 20.2 kg/m as calculated from the following:   Height as of this encounter: 5\' 7"  (1.702 m).   Weight as of this encounter: 58.5 kg.  Code Status: Full code DVT Prophylaxis:  enoxaparin (LOVENOX) injection 40 mg Start: 02/12/23 2200 SCDs Start: 02/12/23 1756   Level of Care: Level of care: Med-Surg Family Communication: Updated patient's daughter, Chip Davin on phone yesterday Disposition Plan:      Remains inpatient appropriate: Pending SNF  bed availability  PT evaluation recommended SNF, wheelchair-bound, lives alone.  Discussed in detail with the patient's daughter, who feels patient is having memory issues and has been having falls recently, does not feel he is safe at home alone.  Updated TOC.   Procedures:  None  Consultants:   IR  Antimicrobials:   Anti-infectives (From admission, onward)    None          Medications  acidophilus  2 capsule Oral Daily   cholecalciferol  5,000 Units Oral QODAY   docusate sodium  100 mg Oral BID   enoxaparin (LOVENOX) injection  40 mg Subcutaneous Q24H    feeding supplement  237 mL Oral BID BM   finasteride  5 mg Oral Daily   fluticasone  2 spray Each Nare Daily   ketorolac  15 mg Intravenous Q8H   labetalol  300 mg Oral BID   NIFEdipine  60 mg Oral QPM   NIFEdipine  30 mg Oral Daily   pantoprazole  40 mg Oral Daily   polyethylene glycol  17 g Oral Daily   predniSONE  60 mg Oral Q breakfast   pseudoephedrine  30 mg Oral Once   rosuvastatin  20 mg Oral Daily   sodium chloride flush  3 mL Intravenous Q12H   sodium chloride  1 g Oral BID WC   spironolactone  100 mg Oral Daily   tamsulosin  0.4 mg Oral BID      Subjective:   Howard Jackson was seen and examined today. + Cough and congestion, back pain controlled, tolerating diet.  No acute issues.  No nausea vomiting or fevers.  Objective:   Vitals:   02/16/23 2156 02/17/23 0540 02/17/23 0554 02/17/23 1033  BP: (!) 162/83 135/74  135/75  Pulse: 76 73 83 86  Resp: 18 17 16 16   Temp: 97.6 F (36.4 C) 98.3 F (36.8 C) 98 F (36.7 C) 98.3 F (36.8 C)  TempSrc: Oral Oral Oral Oral  SpO2: 99% 97% 100% 100%  Weight:      Height:        Intake/Output Summary (Last 24 hours) at 02/17/2023 1254 Last data filed at 02/17/2023 1043 Gross per 24 hour  Intake 720 ml  Output 1090 ml  Net -370 ml     Wt Readings from Last 3 Encounters:  02/12/23 58.5 kg  12/17/22 56.3 kg  12/03/22 59 kg    Physical Exam General: Alert and oriented x 3, NAD Cardiovascular: S1 S2 clear, RRR.  Respiratory: CTAB Gastrointestinal: Soft, nontender, nondistended, NBS Ext: no pedal edema bilaterally Neuro: chronic left weakness from previous stroke, no new deficits Psych: Normal affect  Data Reviewed:  I have personally reviewed following labs    CBC Lab Results  Component Value Date   WBC 12.3 (H) 02/17/2023   RBC 5.08 02/17/2023   HGB 16.2 02/17/2023   HCT 48.1 02/17/2023   MCV 94.7 02/17/2023   MCH 31.9 02/17/2023   PLT 121 (L) 02/17/2023   MCHC 33.7 02/17/2023   RDW 15.6 (H)  02/17/2023   LYMPHSABS 1.0 02/17/2023   MONOABS 0.9 02/17/2023   EOSABS 0.0 02/17/2023   BASOSABS 0.1 02/17/2023     Last metabolic panel Lab Results  Component Value Date   NA 127 (L) 02/17/2023   K 4.6 02/17/2023   CL 95 (L) 02/17/2023   CO2 24 02/17/2023   BUN 29 (H) 02/17/2023   CREATININE 0.63 02/17/2023   GLUCOSE 119 (H) 02/17/2023   GFRNONAA >60  02/17/2023   GFRAA 64 12/16/2019   CALCIUM 8.6 (L) 02/17/2023   PHOS 3.5 12/18/2022   PROT 5.0 (L) 02/13/2023   ALBUMIN 3.0 (L) 02/13/2023   BILITOT 1.0 02/13/2023   ALKPHOS 46 02/13/2023   AST 19 02/13/2023   ALT 39 02/13/2023   ANIONGAP 8 02/17/2023    CBG (last 3)  No results for input(s): "GLUCAP" in the last 72 hours.    Coagulation Profile: Recent Labs  Lab 02/17/23 1056  INR 1.0     Radiology Studies: I have personally reviewed the imaging studies  No results found.     Thad Ranger M.D. [name] 02/17/2023, 12:54 PM  Available via Epic secure chat 7am-7pm After 7 pm, please refer to night coverage provider listed on amion.

## 2023-02-17 NOTE — Progress Notes (Signed)
Pt refused to take his sodium chloride tablet. Pt educated the importance of taking it and informed him that his sodium is low. Pt states it upsets his stomach. RN offered to mix with food but Pt continued to refuse. Dr. Isidoro Donning made aware.

## 2023-02-17 NOTE — Plan of Care (Signed)

## 2023-02-17 NOTE — NC FL2 (Signed)
[place] MEDICAID [facility] LEVEL OF CARE FORM     IDENTIFICATION  Patient Name: Howard Jackson Birthdate: Jan 13, 1946 Sex: male Admission Date (Current Location): 02/12/2023  [facility] and [place] Number:  Producer, television/film/video and Address:  [facility],  [address] [place], [place] [address]      Provider Number: [id number]  Attending Physician Name and Address:  Cathren Harsh, MD  Relative Name and Phone Number:  Zhen [place]a (daughter) Ph: [phone]    Current Level of Care: Hospital Recommended Level of Care: Skilled Nursing Facility Prior Approval Number:    Date Approved/Denied:   PASRR Number: [id number]  Discharge Plan: SNF    Current Diagnoses: Patient Active Problem List   Diagnosis Date Noted   Lumbar compression fracture (HCC) 02/12/2023   Malnutrition of moderate degree 12/18/2022   Temporal arteritis (HCC) 12/17/2022   Chronic diastolic CHF (congestive heart failure) (HCC) 12/17/2022   Hyponatremia 12/17/2022   Tooth infection 12/17/2022   Hypercholesterolemia 06/20/2018   Left spastic hemiparesis (HCC) 06/20/2018   Fall 04/14/2018   Fracture of femoral neck, left, closed (HCC) 04/14/2018   Closed displaced fracture of left femoral neck (HCC) 04/14/2018   Bunion    Hypertension due to endocrine disorder 03/07/2017   BPH (benign prostatic hyperplasia) 11/25/2016   Chronic bilateral thoracic back pain 08/20/2016   Mild intermittent asthma 06/18/2016   Wheezing 06/18/2016   Cough 06/18/2016   Left flank pain 04/18/2015   Benign pheochromocytoma of right adrenal gland 06/23/2014   PAD (peripheral artery disease) (HCC) 03/31/2013   HTN (hypertension) 03/31/2013   History of stroke 03/31/2013   Coronary artery disease involving native coronary artery of native heart without angina pectoris 03/31/2013   Renal artery stenosis (HCC) 03/31/2013   Erectile dysfunction 03/31/2013   Dyslipidemia 03/31/2013   Carotid stenosis 03/31/2013    Left inguinal hernia 09/22/2012    Orientation RESPIRATION BLADDER Height & Weight     Self, Time, Situation, Place  Normal Continent Weight: 129 lb (58.5 kg) Height:  5\' 7"  (170.2 cm)  BEHAVIORAL SYMPTOMS/MOOD NEUROLOGICAL BOWEL NUTRITION STATUS   (N/A)  (N/A) Incontinent Diet (Heart healthy diet)  AMBULATORY STATUS COMMUNICATION OF NEEDS Skin   Limited Assist Verbally Skin abrasions (Abrasion: buttocks)                       Personal Care Assistance Level of Assistance  Feeding, Bathing, Dressing Bathing Assistance: Limited assistance Feeding assistance: Independent Dressing Assistance: Limited assistance     Functional Limitations Info  Sight, Hearing, Speech Sight Info: Adequate Hearing Info: Adequate Speech Info: Adequate    SPECIAL CARE FACTORS FREQUENCY  PT (By licensed PT), OT (By licensed OT)     PT Frequency: 5x's/week OT Frequency: 5x's/week            Contractures Contractures Info: Present (Extensor contracture knee d/t CVA)    Additional Factors Info  Code Status, Allergies, Psychotropic Code Status Info: Full Allergies Info: Amitriptyline Hcl, Other, Losartan Potassium, Papaya Derivatives, Sulfa Antibiotics Psychotropic Info: Desyrel         Current Medications (02/17/2023):  This is the current hospital active medication list Current Facility-Administered Medications  Medication Dose Route Frequency Provider Last Rate Last Admin   acetaminophen (TYLENOL) tablet 650 mg  650 mg Oral Q6H PRN Hugelmeyer, Alexis, DO   650 mg at 02/16/23 2230   Or   acetaminophen (TYLENOL) suppository 650 mg  650 mg Rectal Q6H PRN Hugelmeyer, Alexis, DO  acidophilus (RISAQUAD) capsule 2 capsule  2 capsule Oral Daily Hugelmeyer, Alexis, DO   2 capsule at 02/17/23 1042   alum & mag hydroxide-simeth (MAALOX/MYLANTA) 200-200-20 MG/5ML suspension 15 mL  15 mL Oral Q4H PRN Rai, Ripudeep K, MD   15 mL at 02/16/23 1636   bisacodyl (DULCOLAX) EC tablet 5 mg  5 mg Oral  Daily PRN Hugelmeyer, Alexis, DO       cholecalciferol (VITAMIN D3) 25 MCG (1000 UNIT) tablet 5,000 Units  5,000 Units Oral QODAY Hugelmeyer, Alexis, DO   5,000 Units at 02/16/23 1046   docusate sodium (COLACE) capsule 100 mg  100 mg Oral BID Rai, Ripudeep K, MD   100 mg at 02/16/23 1046   enoxaparin (LOVENOX) injection 40 mg  40 mg Subcutaneous Q24H Hugelmeyer, Alexis, DO   40 mg at 02/15/23 2159   feeding supplement (ENSURE ENLIVE / ENSURE PLUS) liquid 237 mL  237 mL Oral BID BM Hugelmeyer, Alexis, DO   237 mL at 02/16/23 1406   finasteride (PROSCAR) tablet 5 mg  5 mg Oral Daily Hugelmeyer, Alexis, DO   5 mg at 02/17/23 1041   fluticasone (FLONASE) 50 MCG/ACT nasal spray 2 spray  2 spray Each Nare Daily Rai, Ripudeep K, MD       Gerhardt's butt cream   Topical Continuous PRN Rai, Ripudeep K, MD       hydrALAZINE (APRESOLINE) injection 5 mg  5 mg Intravenous Q6H PRN Hugelmeyer, Alexis, DO       HYDROcodone-acetaminophen (NORCO/VICODIN) 5-325 MG per tablet 1-2 tablet  1-2 tablet Oral Q4H PRN Hugelmeyer, Alexis, DO   2 tablet at 02/13/23 0406   ketorolac (TORADOL) 15 MG/ML injection 15 mg  15 mg Intravenous Q8H Rai, Ripudeep K, MD   15 mg at 02/14/23 1610   labetalol (NORMODYNE) tablet 300 mg  300 mg Oral BID Hugelmeyer, Alexis, DO   300 mg at 02/17/23 1041   loratadine (CLARITIN) tablet 10 mg  10 mg Oral Daily Rai, Ripudeep K, MD       magnesium hydroxide (MILK OF MAGNESIA) suspension 15-30 mL  15-30 mL Oral Daily PRN Hugelmeyer, Alexis, DO       morphine (PF) 2 MG/ML injection 1 mg  1 mg Intravenous Q6H PRN Hugelmeyer, Alexis, DO       NIFEdipine (PROCARDIA XL/NIFEDICAL XL) 24 hr tablet 60 mg  60 mg Oral QPM Hugelmeyer, Alexis, DO   60 mg at 02/16/23 2224   NIFEdipine (PROCARDIA-XL/NIFEDICAL-XL) 24 hr tablet 30 mg  30 mg Oral Daily Hugelmeyer, Alexis, DO   30 mg at 02/17/23 1042   ondansetron (ZOFRAN) tablet 4 mg  4 mg Oral Q6H PRN Hugelmeyer, Alexis, DO       Or   ondansetron (ZOFRAN) injection  4 mg  4 mg Intravenous Q6H PRN Hugelmeyer, Alexis, DO       pantoprazole (PROTONIX) EC tablet 40 mg  40 mg Oral Daily Hugelmeyer, Alexis, DO   40 mg at 02/17/23 1041   polyethylene glycol (MIRALAX / GLYCOLAX) packet 17 g  17 g Oral Daily Rai, Ripudeep K, MD   17 g at 02/16/23 1047   predniSONE (DELTASONE) tablet 60 mg  60 mg Oral Q breakfast Hugelmeyer, Alexis, DO   60 mg at 02/17/23 1042   pseudoephedrine (SUDAFED) 15 MG/5ML liquid 30 mg  30 mg Oral Once Rai, Ripudeep K, MD       rosuvastatin (CRESTOR) tablet 20 mg  20 mg Oral Daily Hugelmeyer, Alexis, DO   20  mg at 02/17/23 1042   senna-docusate (Senokot-S) tablet 1 tablet  1 tablet Oral QHS PRN Hugelmeyer, Alexis, DO       sodium chloride flush (NS) 0.9 % injection 3 mL  3 mL Intravenous Q12H Hugelmeyer, Alexis, DO   3 mL at 02/17/23 1049   sodium chloride tablet 1 g  1 g Oral BID WC Rai, Ripudeep K, MD   1 g at 02/16/23 0807   spironolactone (ALDACTONE) tablet 100 mg  100 mg Oral Daily Hugelmeyer, Alexis, DO   100 mg at 02/17/23 1048   tamsulosin (FLOMAX) capsule 0.4 mg  0.4 mg Oral BID Rai, Ripudeep K, MD   0.4 mg at 02/17/23 1042   tiZANidine (ZANAFLEX) tablet 2 mg  2 mg Oral Daily PRN Hugelmeyer, Alexis, DO   2 mg at 02/12/23 2237   traZODone (DESYREL) tablet 25 mg  25 mg Oral QHS PRN Hugelmeyer, Alexis, DO         Discharge Medications: Please see discharge summary for a list of discharge medications.  Relevant Imaging Results:  Relevant Lab Results:   Additional Information SSN: [id number]  Ewing Schlein, LCSW

## 2023-02-17 NOTE — Progress Notes (Signed)
Physical Therapy Treatment Patient Details Name: Howard Jackson MRN: [id number] DOB: 11-Aug-1945 Today's Date: 02/17/2023   History of Present Illness 77 y.o. male admitted with Intractable low back pain due to  L3 compression fracture s/p mechanical fall. IR consulted for possible Kyphoplasty 8/9. Recent admission 01/05/23 for temporal arteritis.  PMH: CVA with residual left sided hemiparesis, HTN, HLD, anxiety, PAD,  primary aldosteronism    PT Comments  General Comments: AxO x 3 pleasant and willing.  Pt lives home alone and was able to self transfer to his power chair.  Scheduled for a Kyphoplasty this Friday 02/20/23.  Attempted OOB to recliner was difficult.  General bed mobility comments: required increased time with use of rail with R UE and HOB elevated.  L UE remained flexed and non functional. General transfer comment: pt required Max Assist to partially stand at bed side using RIGHT bed rail.  Pt placing a majority of his weight throughy RIGHT LE but with grip socks his foot was sliding.  "I do better with my shoes on", ststed pt.  Stance time was < 30 seconds due to increased pain and instability.  Unable to complete a 1/4 pivot turn to recliner.  Assisted back to EOB.  Assisted back to bed + 2 assist. Pt will need ST Rehab at SNF to address mobility and functional decline prior to safely returning home.    If plan is discharge home, recommend the following: Assist for transportation;Help with stairs or ramp for entrance;Assistance with cooking/housework;A lot of help with walking and/or transfers;A lot of help with bathing/dressing/bathroom   Can travel by private vehicle     No  Equipment Recommendations  None recommended by PT    Recommendations for Other Services       Precautions / Restrictions Precautions Precautions: Fall;Back Precaution Comments: CVA L Hemi 2019 Restrictions Weight Bearing Restrictions: No     Mobility  Bed Mobility Overal bed mobility: Needs  Assistance Bed Mobility: Supine to Sit, Sit to Supine     Supine to sit: Min assist, Mod assist Sit to supine: Mod assist, Max assist   General bed mobility comments: required increased time with use of rail with R UE and HOB elevated.  L UE remained flexed and non functional.    Transfers Overall transfer level: Needs assistance Equipment used: 1 person hand held assist Transfers: Sit to/from Stand Sit to Stand: Max assist           General transfer comment: pt required Max Assist to partially stand at bed side using RIGHT bed rail.  Pt placing a majority of his weight throughy RIGHT LE but with grip socks his foot was sliding.  "I do better with my shoes on", ststed pt.  Stance time was < 30 seconds due to increased pain and instability.  Unable to complete a 1/4 pivot turn to recliner.  Assisted back to EOB.  Assisted back to bed + 2 assist.    Ambulation/Gait                   Stairs             Wheelchair Mobility     Tilt Bed    Modified Rankin (Stroke Patients Only)       Balance  Cognition Arousal/Alertness: Awake/alert Behavior During Therapy: WFL for tasks assessed/performed Overall Cognitive Status: Within Functional Limits for tasks assessed                                 General Comments: AxO x 3 pleasant and willing.  Scheduled for a Kyphoplasty this Friday 02/20/23.        Exercises      General Comments        Pertinent Vitals/Pain Pain Assessment Pain Assessment: Faces Faces Pain Scale: Hurts even more Pain Location: back Pain Descriptors / Indicators: Grimacing Pain Intervention(s): Monitored during session, Premedicated before session, Repositioned    Home Living                          Prior Function            PT Goals (current goals can now be found in the care plan section) Progress towards PT goals: Progressing toward  goals    Frequency    Min 1X/week      PT Plan Current plan remains appropriate    Co-evaluation              AM-PAC PT "6 Clicks" Mobility   Outcome Measure  Help needed turning from your back to your side while in a flat bed without using bedrails?: A Lot Help needed moving from lying on your back to sitting on the side of a flat bed without using bedrails?: A Lot Help needed moving to and from a bed to a chair (including a wheelchair)?: A Lot Help needed standing up from a chair using your arms (e.g., wheelchair or bedside chair)?: A Lot Help needed to walk in hospital room?: Total Help needed climbing 3-5 steps with a railing? : Total 6 Click Score: 10    End of Session Equipment Utilized During Treatment: Gait belt Activity Tolerance: Patient limited by pain Patient left: with call bell/phone within reach;in bed;with bed alarm set Nurse Communication: Mobility status PT Visit Diagnosis: Other abnormalities of gait and mobility (R26.89);History of falling (Z91.81)     Time: 1147-1200 PT Time Calculation (min) (ACUTE ONLY): 13 min  Charges:    $Therapeutic Activity: 8-22 mins PT General Charges $$ ACUTE PT VISIT: 1 Visit                     Felecia Shelling  PTA Acute  Rehabilitation Services Office M-F          [phone]

## 2023-02-18 DIAGNOSIS — S32030A Wedge compression fracture of third lumbar vertebra, initial encounter for closed fracture: Secondary | ICD-10-CM | POA: Diagnosis not present

## 2023-02-18 MED ORDER — LABETALOL HCL 100 MG PO TABS
200.0000 mg | ORAL_TABLET | Freq: Two times a day (BID) | ORAL | Status: DC
  Administered 2023-02-18 – 2023-02-22 (×10): 200 mg via ORAL
  Filled 2023-02-18 (×11): qty 2

## 2023-02-18 NOTE — Hospital Course (Addendum)
Brief hospital course: Patient is a 77 year old male with CVA, residual left-sided hemiparesis, HTN, HLD, anxiety, PAD, primary hyper aldosteronism presented to ED with low back pain after a fall. Of note, patient was hospitalized in 6/24 for headache and temporal arteritis workup and has been on steroids for that.    At baseline he is able to rise from sitting and pivot into wheelchair.  He does follow with neurosurgery clinic CT lumbar spine showed acute superior endplate compression deformity at anterior aspect of L3, age-indeterminate  Assessment and Plan: Mechanical fall with intractable back pain secondary to L3  compression fracture Patient lives alone, wheelchair-bound, chronic left-sided weakness from previous stroke MRI lumbar spine showed acute L3 compression fracture with slight height loss. Continue pain control, PT OT evaluation PT eval 8/3 recommended SNF IR consulted for vertebroplasty, recommended to hold off Plavix for 5 days, tentative plan on Friday 8/9.  Will need SNF after kyphoplasty.   Mild hyponatremia Appears to have chronic hyponatremia Urine osmolality 567, urine sodium 145, serum osmolarity 274, likely has SIADH Placed on salt tablets however patient continues to refuse salt tabs. Sodium improved to 129 worsening again to 144.  Will treat with urea.  Also free water restriction.  Mild hyperkalemia. Treat with Lokelma. Hold Aldactone.   HTN (hypertension) BP stable, continue labetalol, nifedipine   History of stroke with left-sided hemiparesis BP stable, continue antihypertensives Plavix held for pending vertebroplasty on Friday 8/9   History of headaches and recent temporal arteritis Continue prednisone   BPH (benign prostatic hyperplasia) Continue Proscar, tamsulosin   Hypercholesterolemia Continue Crestor   Chronic diastolic CHF (congestive heart failure) (HCC) Euvolemic, no acute issues, no recent echocardiogram   Primary hyper  aldosteronism Holding Aldactone 100 mg daily due to hyperkalemia.   Cough and congestion Claritin, Flonase spray   Thrombocytopenia. Platelet: 140 at the time of admission trending down to 106.  Etiology not clear. smear evaluation was unremarkable on 8/6. INR normal.  Leukocytosis. X-ray chest and x-ray abdomen unremarkable. Leukocytosis mostly in the setting of steroids.  No concern for systemic infection.  Moderate protein calorie malnutrition Refusing protein shakes. Monitor.

## 2023-02-18 NOTE — Plan of Care (Signed)
  Problem: Activity: Goal: Risk for activity intolerance will decrease Outcome: Progressing   Problem: Nutrition: Goal: Adequate nutrition will be maintained Outcome: Progressing   Problem: Pain Managment: Goal: General experience of comfort will improve Outcome: Progressing   

## 2023-02-18 NOTE — Consult Note (Signed)
Chief Complaint: Patient was seen in consultation today for L3 vertebral body augmentation/kyphoplasty  Chief Complaint  Patient presents with   Fall    Referring Physician(s): Rai,R  Supervising Physician: Marliss Coots  Patient Status: [facility] - In-pt  History of Present Illness: Howard Jackson is a 77 y.o. male with past medical history of CVA with some residual left-sided hemiparesis, coronary artery disease with stent placement, BPH, CHF, thrombocytopenia, hypertension, hyperlipidemia, anxiety, PAD, primary hyperaldosteronism who was admitted to [facility] on 8/1 with persistent low back pain following a fall.  Patient also was hospitalized in June of this year for headache and temporal arteritis and has been on steroids.  MRI of the lumbar spine on 8/2 revealed:  1. Acute L3 compression fracture with slight height loss. 2. Generalized lumbar spine degeneration with scoliosis and mild L3-4 anterolisthesis. 3. Moderate foraminal narrowing on the right at L5-S1 and left at L3-4, L4-5. 4. Mild to moderate spinal stenosis at L4-5.  Patient continues to have moderate low back pain which is exacerbated by movement and despite use of morphine, Vicodin, Zanaflex.  He is afebrile with WBC 11.5, platelets 106k, creatinine 0.59, PT/INR normal.  Request now received from primary care team for consideration of L3 vertebral body augmentation.   Past Medical History:  Diagnosis Date   Adrenal gland disorder (HCC)    tumor present - no change-  no recent increase     Arthritis    Benign pheochromocytoma of right adrenal gland 06/23/2014   Never had histological diagnosis, but reportedly initially diagnosed in 1989   Bunion    left foot   Complication of anesthesia    "died on the table during cervical fusion" '89 - believed to be d/t "too much medication" to treat HTN and was later dx with pheochromocytoma   Coronary artery disease    cleared for surg. by Dr. Allyson Sabal -  [facility]   H/O hiatal hernia    Hammer toe    Herniated lumbar intervertebral disc    History of anxiety    Hyperlipidemia    Hypertension    Neurogenic bladder    Neuromuscular disorder (HCC)    carpal tunnel problem, even after surgery release    Peripheral arterial disease (HCC)    Primary aldosteronism (HCC)    Stroke (HCC)    in [place]- 1999, L sided weakness, wears a brace on L leg & uses cane    Past Surgical History:  Procedure Laterality Date   ANTERIOR APPROACH HEMI HIP ARTHROPLASTY Left 04/15/2018   Procedure: ANTERIOR APPROACH HEMI HIP ARTHROPLASTY;  Surgeon: Samson Frederic, MD;  Location: [facility] OR;  Service: Orthopedics;  Laterality: Left;   CARPAL TUNNEL RELEASE     bilateral    CERVICAL FUSION     CORONARY ANGIOPLASTY WITH STENT PLACEMENT     pt reports having 5 stents in his heart-2011   HERNIA REPAIR     INGUINAL HERNIA REPAIR Left 11/18/2012   Procedure: HERNIA REPAIR INGUINAL ADULT;  Surgeon: Shelly Rubenstein, MD;  Location: [facility] OR;  Service: General;  Laterality: Left;   INSERTION OF MESH Left 11/18/2012   Procedure: INSERTION OF MESH;  Surgeon: Shelly Rubenstein, MD;  Location: [facility] OR;  Service: General;  Laterality: Left;    Allergies: Amitriptyline hcl, Other, Losartan potassium, Papaya derivatives, and Sulfa antibiotics  Medications: Prior to Admission medications   Medication Sig Start Date End Date Taking? Authorizing Provider  acetaminophen (TYLENOL) 500 MG tablet Take 500 mg  by mouth every 6 (six) hours as needed for moderate pain or mild pain.   Yes [provider]  Boswellia Serrata (BOSWELLIA PO) Take 1 capsule by mouth daily as needed (for pain).   Yes [provider]  Cholecalciferol (VITAMIN D3) 125 MCG (5000 UT) CAPS Take 5,000 Units by mouth every other day.   Yes [provider]  CITRUS BERGAMOT PO Take 1 capsule by mouth daily as needed (for pain).   Yes [provider]  clopidogrel (PLAVIX) 75 MG tablet TAKE 1  TABLET BY MOUTH EVERY DAY Patient taking differently: Take 75 mg by mouth daily. 10/28/21  Yes Croitoru, Mihai, MD  Coenzyme Q10-Vitamin E (QUNOL ULTRA COQ10) 100-150 MG-UNIT CAPS Take 1 capsule by mouth daily.   Yes [provider]  finasteride (PROSCAR) 5 MG tablet Take 5 mg by mouth daily. 01/19/18  Yes [provider]  labetalol (NORMODYNE) 300 MG tablet TAKE 1 TABLET BY MOUTH 2 TIMES DAILY. Patient taking differently: Take 300 mg by mouth 2 (two) times daily. 05/08/21  Yes Croitoru, Mihai, MD  NIFEdipine (ADALAT CC) 30 MG 24 hr tablet Take 30 mg by mouth in the morning.   Yes [provider]  NIFEdipine (ADALAT CC) 60 MG 24 hr tablet Take 1 tablet (60 mg total) by mouth every evening. 04/29/22  Yes Croitoru, Mihai, MD  NON FORMULARY Take 1-2 capsules by mouth See admin instructions. Nitric Oxide Beet Root Capsules- Take 1-2 capsules by mouth once a day   Yes [provider]  NON FORMULARY Take 14 g by mouth See admin instructions. MCT Oil - Keto & Vegan MCTs C8, C10 from Coconuts- Mix 14 grams into a desired drink or food and consume by mouth once a day   Yes [provider]  NON FORMULARY Take 1 capsule by mouth See admin instructions. [place] Rhubarb - Herbal Supplement for Digestive Health - Natural Formula- Take 1 capsule by mouth once a day as needed to support digestive health   Yes [provider]  oxyCODONE-acetaminophen (PERCOCET/ROXICET) 5-325 MG tablet Take 1 tablet by mouth every 8 (eight) hours as needed for up to 15 doses for severe pain. 02/12/23  Yes Terald Sleeper, MD  pantoprazole (PROTONIX) 40 MG tablet Take 1 tablet (40 mg total) by mouth daily. Take along with prednisone 12/19/22  Yes Sharl Ma, Sarina Ill, MD  predniSONE (DELTASONE) 20 MG tablet Take 3 tablets (60 mg total) by mouth daily with breakfast. 12/20/22 02/18/23 Yes Sharl Ma, Sarina Ill, MD  rosuvastatin (CRESTOR) 20 MG tablet TOME UNA TABLETA TODOS LOS DIAS Patient taking differently:  Take 20 mg by mouth daily. 01/28/21  Yes Croitoru, Mihai, MD  senna-docusate (SENOKOT-S) 8.6-50 MG tablet Take 3 tablets by mouth every evening. Patient taking differently: Take 3 tablets by mouth daily as needed for mild constipation. 04/20/18  Yes Briant Cedar, MD  senna-docusate (SENOKOT-S) 8.6-50 MG tablet Take 1 tablet by mouth daily as needed for up to 15 days for mild constipation (To prevent constipation from percocet narcotic). 02/12/23 02/27/23 Yes Trifan, Kermit Balo, MD  spironolactone (ALDACTONE) 100 MG tablet Take 1 tablet (100 mg total) by mouth daily. 04/29/22  Yes Croitoru, Mihai, MD  tamsulosin (FLOMAX) 0.4 MG CAPS capsule Take 1 capsule (0.4 mg total) by mouth daily. Patient taking differently: Take 0.4 mg by mouth in the morning and at bedtime. 01/09/17  Yes Massie Maroon, FNP  tiZANidine (ZANAFLEX) 2 MG tablet Take 2 mg by mouth daily as needed  for muscle spasms.   Yes [provider]  triamcinolone ointment (KENALOG) 0.1 % Apply 1 Application topically 2 (two) times daily as needed (for dryness).   Yes [provider]  lactobacillus acidophilus (BACID) TABS tablet Take 2 tablets by mouth 2 (two) times daily. Patient taking differently: Take 2 tablets by mouth daily. 04/20/18   Briant Cedar, MD  magnesium hydroxide (MILK OF MAGNESIA) 400 MG/5ML suspension Take 15-30 mLs by mouth daily as needed for mild constipation.    [provider]     Family History  Problem Relation Age of Onset   CVA Father    Diabetes Father    Alzheimer's disease Father    Hypertension Sister    Hypertension Brother    CVA Paternal Grandmother    Hypertension Brother    Hypertension Mother     Social History   Socioeconomic History   Marital status: Legally Separated    Spouse name: Not on file   Number of children: Not on file   Years of education: Not on file   Highest education level: Not on file  Occupational History   Not on file  Tobacco Use    Smoking status: Never   Smokeless tobacco: Never  Vaping Use   Vaping status: Never Used  Substance and Sexual Activity   Alcohol use: Yes    Comment: social - one drink, on special occasion    Drug use: No   Sexual activity: Not on file  Other Topics Concern   Not on file  Social History Narrative   Not on file   Social Determinants of Health   Financial Resource Strain: Not on file  Food Insecurity: Food Insecurity Present (02/12/2023)   Hunger Vital Sign    Worried About Running Out of Food in the Last Year: Sometimes true    Ran Out of Food in the Last Year: Sometimes true  Transportation Needs: No Transportation Needs (02/12/2023)   PRAPARE - Administrator, Civil Service (Medical): No    Lack of Transportation (Non-Medical): No  Physical Activity: Not on file  Stress: Not on file  Social Connections: Unknown (11/25/2021)   Received from [facility]   Social Network    Social Network: Not on file      Review of Systems currently denies fever, headache, chest pain, worsening dyspnea, abdominal pain, vomiting or bleeding.  He does have occasional cough with some congestion, noted lower back pain and intermittent nausea.  Vital Signs: BP 134/73 (BP Location: Right Arm)   Pulse 80   Temp (!) 97.4 F (36.3 C) (Oral)   Resp 17   Ht 5\' 7"  (1.702 m)   Wt 129 lb (58.5 kg)   SpO2 96%   BMI 20.20 kg/m   Advance Care Plan: No documents on file   Physical Exam: Patient awake, alert.  Chest clear to auscultation bilaterally.  Heart with regular rate and rhythm.  Abdomen soft, positive bowel sounds, nontender.  Moderate paravertebral tenderness noted at L3 region.  No sig lower extremity edema.  Imaging: DG CHEST PORT 1 VIEW  Result Date: 02/17/2023 CLINICAL DATA:  Chest congestion EXAM: PORTABLE CHEST 1 VIEW COMPARISON:  X-ray 12/17/2022 and older FINDINGS: Normal cardiopericardial silhouette. No consolidation, pneumothorax or effusion. No  edema. Calcified aorta. Deformity of the left shoulder, scapula with degenerative changes, chronic. IMPRESSION: No acute cardiopulmonary disease. Chronic changes of the left shoulder and scapula. Electronically Signed   By: Karen Kays  M.D.   On: 02/17/2023 17:22   MR LUMBAR SPINE WO CONTRAST  Result Date: 02/13/2023 CLINICAL DATA:  Lumbar compression fracture. EXAM: MRI LUMBAR SPINE WITHOUT CONTRAST TECHNIQUE: Multiplanar, multisequence MR imaging of the lumbar spine was performed. No intravenous contrast was administered. COMPARISON:  Lumbar CT from yesterday FINDINGS: Segmentation:  Standard. Alignment:  Mild scoliosis and L3-4 anterolisthesis. Vertebrae: Cores on tool band of marrow edema in the L3 body following a hypointense fracture plane. Height loss is minimal and non progressed. No evidence of bone lesion. Conus medullaris and cauda equina: Conus extends to the L1-2 level. Conus and cauda equina appear normal. Paraspinal and other soft tissues: Negative for perispinal mass or inflammation. Asymmetric atrophy of the left psoas. Disc levels: T12- L1: Unremarkable. L1-L2: Unremarkable. L2-L3: Foraminal predominant disc bulging with posterior annular fissure. Negative facets. L3-L4: Degenerative facet spurring with mild anterolisthesis. The disc is circumferentially bulging with biforaminal protrusion. Mild annular fissuring. A left foraminal protrusion indents the left L3 nerve root. L4-L5: Disc narrowing and bulging with left foraminal protrusion. Degenerative facet spurring on both sides. Mild to moderate spinal stenosis. The left foraminal disc protrusion protrusion indents the left L4 nerve root. L5-S1:Bulging disc with right foraminal protrusion causing moderate foraminal stenosis. Mild contribution from facet spurring on the right. IMPRESSION: 1. Acute L3 compression fracture with slight height loss. 2. Generalized lumbar spine degeneration with scoliosis and mild L3-4 anterolisthesis. 3. Moderate  foraminal narrowing on the right at L5-S1 and left at L3-4, L4-5. 4. Mild to moderate spinal stenosis at L4-5. Electronically Signed   By: Tiburcio Pea M.D.   On: 02/13/2023 18:06   CT Lumbar Spine Wo Contrast  Result Date: 02/12/2023 CLINICAL DATA:  Back trauma, no prior imaging (Age >= 16y) EXAM: CT LUMBAR SPINE WITHOUT CONTRAST TECHNIQUE: Multidetector CT imaging of the lumbar spine was performed without intravenous contrast administration. Multiplanar CT image reconstructions were also generated. RADIATION DOSE REDUCTION: This exam was performed according to the departmental dose-optimization program which includes automated exposure control, adjustment of the mA and/or kV according to patient size and/or use of iterative reconstruction technique. COMPARISON:  None Available. FINDINGS: Segmentation: 5 lumbar type vertebrae. Alignment: Normal. Vertebrae: There is a very mild compression deformity of the anterior superior endplate of L3 (series 5, image 13). No focal pathologic process. Paraspinal and other soft tissues: Aortic atherosclerotic calcifications. Disc levels: Mild spinal canal narrowing at L3-L4 and moderate spinal canal narrowing at L4-L5. Moderate bilateral neural foraminal narrowing at L4-L5. Severe right-sided neural foraminal narrowing at L5-S1. IMPRESSION: 1. Very mild acute superior endplate compression deformity at the anterior aspect of L3, age indeterminate. 2. Moderate spinal canal narrowing at L4-L5 3. Severe right-sided neural foraminal narrowing at L5-S1. Aortic Atherosclerosis (ICD10-I70.0). Electronically Signed   By: Lorenza Cambridge M.D.   On: 02/12/2023 11:05   DG Hips Bilat W or Wo Pelvis 3-4 Views  Result Date: 02/12/2023 CLINICAL DATA:  Trauma, fall, pain EXAM: DG HIP (WITH OR WITHOUT PELVIS) 3-4V BILAT COMPARISON:  08/02/2019 FINDINGS: There is previous left hip arthroplasty. No recent fracture or dislocation is seen. Arterial calcifications are noted. There are tiny  metallic densities in the soft tissues adjacent to both hips, possibly residual from previous injury. Dextroscoliosis is seen in the lumbar spine. Degenerative changes are noted in lumbar spine with bony spurs, disc space narrowing and facet hypertrophy. IMPRESSION: No recent fracture or dislocation is seen. Previous left hip arthroplasty. Arteriosclerosis.  Lumbar spondylosis. Electronically Signed   By: Ernie Avena  M.D.   On: 02/12/2023 09:33    Labs:  CBC: Recent Labs    02/15/23 0401 02/16/23 0322 02/17/23 0751 02/18/23 0333  WBC 10.7* 16.1* 12.3* 11.5*  HGB 15.3 14.6 16.2 15.6  HCT 45.6 44.5 48.1 46.8  PLT 118* 115* 121* 106*    COAGS: Recent Labs    02/17/23 1056  INR 1.0    BMP: Recent Labs    02/15/23 0401 02/16/23 0322 02/17/23 0751 02/18/23 0333  NA 129* 128* 127* 128*  K 4.2 4.2 4.6 4.3  CL 99 97* 95* 98  CO2 23 22 24  21*  GLUCOSE 226* 190* 119* 181*  BUN 33* 29* 29* 28*  CALCIUM 8.2* 8.2* 8.6* 8.2*  CREATININE 0.63 0.68 0.63 0.59*  GFRNONAA >60 >60 >60 >60    LIVER FUNCTION TESTS: Recent Labs    11/04/22 1331 12/17/22 1735 12/18/22 0436 02/13/23 0345  BILITOT 0.9 0.5 0.2* 1.0  AST 31 39 36 19  ALT 37 82* 74* 39  ALKPHOS 69 86 68 46  PROT 6.2* 7.1 6.1* 5.0*  ALBUMIN 3.2* 3.0* 2.6* 3.0*    TUMOR MARKERS: No results for input(s): "AFPTM", "CEA", "CA199", "CHROMGRNA" in the last 8760 hours.  Assessment and Plan: 77 y.o. male with past medical history of CVA with some residual left-sided hemiparesis, coronary artery disease with stent placement, BPH, CHF, thrombocytopenia, hypertension, hyperlipidemia, anxiety, PAD, primary hyperaldosteronism who was admitted to [facility] on 8/1 with persistent low back pain following a fall.  Patient also was hospitalized in June of this year for headache and temporal arteritis and has been on steroids.  MRI of the lumbar spine on 8/2 revealed:  1. Acute L3 compression fracture with slight  height loss. 2. Generalized lumbar spine degeneration with scoliosis and mild L3-4 anterolisthesis. 3. Moderate foraminal narrowing on the right at L5-S1 and left at L3-4, L4-5. 4. Mild to moderate spinal stenosis at L4-5.  Patient continues to have moderate low back pain which is exacerbated by movement and despite use of morphine, Vicodin, Zanaflex.  He is afebrile with WBC 11.5, platelets 106k, creatinine 0.59, PT/INR normal.  Request now received from primary care team for consideration of L3 vertebral body augmentation.  Imaging studies have been reviewed by Drs. Hassell/Suttle.Risks and benefits of procedure were discussed with the patient/daughter Howard Jackson  including, but not limited to education regarding the natural healing process of compression fractures without intervention, bleeding, infection, cement migration which may cause spinal cord damage, paralysis, pulmonary embolism or even death.  This interventional procedure involves the use of X-rays and because of the nature of the planned procedure, it is possible that we will have prolonged use of X-ray fluoroscopy.  Potential radiation risks to you include (but are not limited to) the following: - A slightly elevated risk for cancer  several years later in life. This risk is typically less than 0.5% percent. This risk is low in comparison to the normal incidence of human cancer, which is 33% for women and 50% for men according to the American Cancer Society. - Radiation induced injury can include skin redness, resembling a rash, tissue breakdown / ulcers and hair loss (which can be temporary or permanent).   The likelihood of either of these occurring depends on the difficulty of the procedure and whether you are sensitive to radiation due to previous procedures, disease, or genetic conditions.   IF your procedure requires a prolonged use of radiation, you will be notified and given written instructions for  further action.   It is your responsibility to monitor the irradiated area for the 2 weeks following the procedure and to notify your physician if you are concerned that you have suffered a radiation induced injury.    All of the patient's questions were answered, patient is agreeable to proceed.  Consent signed and in chart.  Procedure tentatively planned for 8/9; Plavix has been held accordingly; will plan to hold Lovenox dose tomorrow evening and until after procedure    Thank you for this interesting consult.  I greatly enjoyed meeting Howard Jackson and look forward to participating in their care.  A copy of this report was sent to the requesting provider on this date.  Electronically Signed: D. Jeananne Rama, PA-C 02/18/2023, 5:17 PM   I spent a total of  25 minutes   in face to face in clinical consultation, greater than 50% of which was counseling/coordinating care for image guided L3 vertebral body augmentation/kyphoplasty

## 2023-02-18 NOTE — Progress Notes (Signed)
[facility] Progress Note Patient: Howard Jackson NFA:[id number] DOB: Jul 08, 1946 DOA: 02/12/2023  DOS: the patient was seen and examined on 02/18/2023  Brief hospital course: Patient is a 77 year old male with CVA, residual left-sided hemiparesis, HTN, HLD, anxiety, PAD, primary hyper aldosteronism presented to ED with low back pain after a fall. Of note, patient was hospitalized in 6/24 for headache and temporal arteritis workup and has been on steroids for that.    At baseline he is able to rise from sitting and pivot into wheelchair.  He does follow with neurosurgery clinic CT lumbar spine showed acute superior endplate compression deformity at anterior aspect of L3, age-indeterminate  Assessment and Plan: Mechanical fall with intractable back pain secondary to L3  compression fracture Patient lives alone, wheelchair-bound, chronic left-sided weakness from previous stroke MRI lumbar spine showed acute L3 compression fracture with slight height loss. Continue pain control, PT OT evaluation PT eval 8/3 recommended SNF IR consulted for vertebroplasty, recommended to hold off Plavix for 5 days, tentative plan on Friday 8/9.  TOC consulted for SNF, pending bed availability will assess if vertebroplasty inpatient versus outpatient.   Mild hyponatremia Appears to have chronic hyponatremia Urine osmolality 567, urine sodium 145, serum osmolarity 274, likely has SIADH Placed on salt tablets however patient continues to refuse salt tabs. Sodium improved to 129   HTN (hypertension) BP stable, continue labetalol, nifedipine   History of stroke with left-sided hemiparesis BP stable, continue antihypertensives Plavix held for pending vertebroplasty on Friday 8/9   History of headaches and recent temporal arteritis Continue prednisone   BPH (benign prostatic hyperplasia) Continue Proscar, tamsulosin   Hypercholesterolemia Continue Crestor   Chronic diastolic CHF (congestive heart  failure) (HCC) Euvolemic, no acute issues, no recent echocardiogram   Primary hyper aldosteronism Continue Aldactone 100 mg daily   Cough and congestion Claritin, Flonase spray   Thrombocytopenia. Platelet: 140 at the time of admission trending down 206.  Etiology not clear. Technology smear evaluation was unremarkable on 8/6. INR normal.  Subjective: Pain still present.  No nausea no vomiting no fever no chills.  Physical Exam: General: in Mild distress, No Rash Cardiovascular: S1 and S2 Present, No Murmur Respiratory: Good respiratory effort, Bilateral Air entry present. No Crackles, No wheezes Abdomen: Bowel Sound present, No tenderness Extremities: Trace edema Neuro: Alert and oriented x3, no new focal deficit  Data Reviewed: I have Reviewed nursing notes, Vitals, and Lab results. Since last encounter, pertinent lab results CBC and BMP   . I have ordered test including CBC and BMP  .   Disposition: Status is: Inpatient Remains inpatient appropriate because: Awaiting kyphoplasty on 8/9. enoxaparin (LOVENOX) injection 40 mg Start: 02/12/23 2200 SCDs Start: 02/12/23 1756   Family Communication: No one at bedside Level of care: Med-Surg   Vitals:   02/17/23 1304 02/17/23 2332 02/18/23 0550 02/18/23 1314  BP: 124/73 130/61 121/62 134/73  Pulse: 88 69 65 80  Resp: 17 15 14 17   Temp: 97.9 F (36.6 C) 98.4 F (36.9 C) (!) 97.5 F (36.4 C) (!) 97.4 F (36.3 C)  TempSrc: Oral  Oral Oral  SpO2: 97% 100% 100% 96%  Weight:      Height:         Author: Lynden Oxford, MD 02/18/2023 4:52 PM  Please look on [email or web address] to find out who is on call.

## 2023-02-18 NOTE — TOC Progression Note (Signed)
Transition of Care [facility]) - Progression Note   Patient Details  Name: Howard Jackson MRN: [id number] Date of Birth: June 14, 1946  [facility]) CM/SW Contact  Ewing Schlein, LCSW Phone Number: 02/18/2023, 3:10 PM  Clinical Narrative: CSW provided update to patient and daughter, Amalthea, regarding bed offers. CSW followed up with [facility] and [facility] to have the SNF referral reviewed. CSW updated [place] with [facility] [phone]). TOC awaiting updates from last 2 SNFs.  Expected Discharge Plan: Skilled Nursing Facility Barriers to Discharge: Continued Medical Work up, SNF Pending bed offer  Expected Discharge Plan and Services In-house Referral: Clinical Social Work Post Acute Care Choice: Skilled Nursing Facility Living arrangements for the past 2 months: Single Family Home  Social Determinants of Health (SDOH) Interventions SDOH Screenings   Food Insecurity: Food Insecurity Present (02/12/2023)  Housing: Low Risk  (02/12/2023)  Transportation Needs: No Transportation Needs (02/12/2023)  Utilities: Not At Risk (02/12/2023)  Social Connections: Unknown (11/25/2021)   Received from [facility]  Tobacco Use: Low Risk  (02/12/2023)   Readmission Risk Interventions    02/16/2023    3:07 PM  Readmission Risk Prevention Plan  Transportation Screening Complete  PCP or Specialist Appt within 3-5 Days Complete  HRI or Home Care Consult Complete  Social Work Consult for Recovery Care Planning/Counseling Complete  Palliative Care Screening Not Applicable  Medication Review Oceanographer) Complete

## 2023-02-19 ENCOUNTER — Inpatient Hospital Stay (HOSPITAL_COMMUNITY): Payer: Medicare (Managed Care)

## 2023-02-19 DIAGNOSIS — S32030A Wedge compression fracture of third lumbar vertebra, initial encounter for closed fracture: Secondary | ICD-10-CM | POA: Diagnosis not present

## 2023-02-19 LAB — COMPREHENSIVE METABOLIC PANEL
ALT: 156 U/L — ABNORMAL HIGH (ref 0–44)
AST: 37 U/L (ref 15–41)
Albumin: 2.8 g/dL — ABNORMAL LOW (ref 3.5–5.0)
Alkaline Phosphatase: 67 U/L (ref 38–126)
Anion gap: 13 (ref 5–15)
BUN: 43 mg/dL — ABNORMAL HIGH (ref 8–23)
CO2: 19 mmol/L — ABNORMAL LOW (ref 22–32)
Calcium: 8.6 mg/dL — ABNORMAL LOW (ref 8.9–10.3)
Chloride: 95 mmol/L — ABNORMAL LOW (ref 98–111)
Creatinine, Ser: 0.7 mg/dL (ref 0.61–1.24)
GFR, Estimated: >60 mL/min (ref >60)
Glucose, Bld: 186 mg/dL — ABNORMAL HIGH (ref 70–99)
Potassium: 4.8 mmol/L (ref 3.5–5.1)
Sodium: 127 mmol/L — ABNORMAL LOW (ref 135–145)
Total Bilirubin: 0.5 mg/dL (ref 0.3–1.2)
Total Protein: 5.5 g/dL — ABNORMAL LOW (ref 6.5–8.1)

## 2023-02-19 LAB — MAGNESIUM: Magnesium: 1.9 mg/dL (ref 1.7–2.4)

## 2023-02-19 MED ORDER — BOOST / RESOURCE BREEZE PO LIQD CUSTOM
1.0000 | Freq: Three times a day (TID) | ORAL | Status: DC
  Administered 2023-02-19 – 2023-02-21 (×2): 1 via ORAL

## 2023-02-19 MED ORDER — ENOXAPARIN SODIUM 40 MG/0.4ML IJ SOSY: 40.0000 mg | PREFILLED_SYRINGE | INTRAMUSCULAR | Status: DC

## 2023-02-19 MED ORDER — CEFAZOLIN SODIUM-DEXTROSE 2-4 GM/100ML-% IV SOLN: 2.0000 g | INTRAVENOUS | Status: AC

## 2023-02-19 MED ORDER — PROSOURCE PLUS PO LIQD
30.0000 mL | Freq: Two times a day (BID) | ORAL | Status: DC
  Administered 2023-02-19 – 2023-02-21 (×4): 30 mL via ORAL
  Filled 2023-02-19 (×5): qty 30

## 2023-02-19 NOTE — Plan of Care (Signed)

## 2023-02-19 NOTE — Plan of Care (Signed)

## 2023-02-19 NOTE — Progress Notes (Signed)
Chaplain engaged in an initial visit with Kern Alberta. Hasten engaged in narrative life review. He shared about being from the [place], moving to the [place], his family, and his healthcare journey. He shared about how falling has been hard on his body. Wendle declared his desire to keep living and going.   Montrell has three daughters and is also a grandfather. He expressed his pride in his family. He also values reading, learning, and books.   Chaplain offered reflective and supportive presence as he shared about his life. Chaplain assessed that community support and community are important to him. He valued having someone to talk with.     02/19/23 1300  Spiritual Encounters  Type of Visit Initial  Care provided to: Patient  Spiritual Framework  Presenting Themes Significant life change;Community and relationships;Meaning/purpose/sources of inspiration  Community/Connection Family  Interventions  Spiritual Care Interventions Made Reflective listening;Narrative/life review;Established relationship of care and support;Compassionate presence

## 2023-02-19 NOTE — Progress Notes (Signed)
[facility] Progress Note Patient: Howard Jackson [id number] DOB: 1945/08/02 DOA: 02/12/2023  DOS: the patient was seen and examined on 02/19/2023  Brief hospital course: Patient is a 77 year old male with CVA, residual left-sided hemiparesis, HTN, HLD, anxiety, PAD, primary hyper aldosteronism presented to ED with low back pain after a fall. Of note, patient was hospitalized in 6/24 for headache and temporal arteritis workup and has been on steroids for that.    At baseline he is able to rise from sitting and pivot into wheelchair.  He does follow with neurosurgery clinic CT lumbar spine showed acute superior endplate compression deformity at anterior aspect of L3, age-indeterminate  Assessment and Plan: Mechanical fall with intractable back pain secondary to L3  compression fracture Patient lives alone, wheelchair-bound, chronic left-sided weakness from previous stroke MRI lumbar spine showed acute L3 compression fracture with slight height loss. Continue pain control, PT OT evaluation PT eval 8/3 recommended SNF IR consulted for vertebroplasty, recommended to hold off Plavix for 5 days, tentative plan on Friday 8/9.  Will need SNF after kyphoplasty.   Mild hyponatremia Appears to have chronic hyponatremia Urine osmolality 567, urine sodium 145, serum osmolarity 274, likely has SIADH Placed on salt tablets however patient continues to refuse salt tabs. Sodium improved to 129   HTN (hypertension) BP stable, continue labetalol, nifedipine   History of stroke with left-sided hemiparesis BP stable, continue antihypertensives Plavix held for pending vertebroplasty on Friday 8/9   History of headaches and recent temporal arteritis Continue prednisone   BPH (benign prostatic hyperplasia) Continue Proscar, tamsulosin   Hypercholesterolemia Continue Crestor   Chronic diastolic CHF (congestive heart failure) (HCC) Euvolemic, no acute issues, no recent echocardiogram    Primary hyper aldosteronism Continue Aldactone 100 mg daily   Cough and congestion Claritin, Flonase spray   Thrombocytopenia. Platelet: 140 at the time of admission trending down to 106.  Etiology not clear. smear evaluation was unremarkable on 8/6. INR normal.  Leukocytosis. X-ray chest and x-ray abdomen unremarkable. Leukocytosis mostly in the setting of steroids.  No concern for systemic infection.   Subjective: Back pain stable.  No nausea no vomiting.  Some cough.  No abdominal pain but had some discomfort after drinking Ensure.  Physical Exam: General: in Mild distress, No Rash Cardiovascular: S1 and S2 Present, No Murmur Respiratory: Good respiratory effort, Bilateral Air entry present. No Crackles, No wheezes Abdomen: Bowel Sound present, No tenderness Extremities: No edema Neuro: Alert and oriented x3, no new focal deficit  Data Reviewed: I have Reviewed nursing notes, Vitals, and Lab results. Since last encounter, pertinent lab results CBC and BMP   . I have ordered test including CBC and BMP  . I have ordered imaging chest x-ray and x-ray abdomen  .   Disposition: Status is: Inpatient Remains inpatient appropriate because: Awaiting kyphoplasty tomorrow.  enoxaparin (LOVENOX) injection 40 mg Start: 02/20/23 2200 SCDs Start: 02/12/23 1756   Family Communication: No one at bedside Level of care: Med-Surg   Vitals:   02/18/23 0550 02/18/23 1314 02/18/23 2103 02/19/23 0524  BP: 121/62 134/73 125/66 125/69  Pulse: 65 80 85 71  Resp: 14 17 16 17   Temp: (!) 97.5 F (36.4 C) (!) 97.4 F (36.3 C) 98.1 F (36.7 C) 97.6 F (36.4 C)  TempSrc: Oral Oral Oral Oral  SpO2: 100% 96% 95% 97%  Weight:      Height:         Author: Lynden Oxford, MD 02/19/2023 5:15 PM  Please look on  [email or web address] to find out who is on call.

## 2023-02-19 NOTE — TOC Progression Note (Signed)
Transition of Care [facility]) - Progression Note   Patient Details  Name: Freman Leonor MRN: [id number] Date of Birth: Nov 06, 1945  [facility]) CM/SW Contact  Ewing Schlein, LCSW Phone Number: 02/19/2023, 12:53 PM  Clinical Narrative: [facility] and daughter is agreeable. CSW confirmed bed with [facility]. CSW called [place] with [facility] to start insurance authorization.  Expected Discharge Plan: Skilled Nursing Facility Barriers to Discharge: English as a second language teacher, Continued Medical Work up  Expected Discharge Plan and Services In-house Referral: Clinical Social Work Post Acute Care Choice: Skilled Nursing Facility Living arrangements for the past 2 months: Single Family Home  Social Determinants of Health (SDOH) Interventions SDOH Screenings   Food Insecurity: Food Insecurity Present (02/12/2023)  Housing: Low Risk  (02/12/2023)  Transportation Needs: No Transportation Needs (02/12/2023)  Utilities: Not At Risk (02/12/2023)  Social Connections: Unknown (11/25/2021)   Received from [facility]  Tobacco Use: Low Risk  (02/12/2023)   Readmission Risk Interventions    02/16/2023    3:07 PM  Readmission Risk Prevention Plan  Transportation Screening Complete  PCP or Specialist Appt within 3-5 Days Complete  HRI or Home Care Consult Complete  Social Work Consult for Recovery Care Planning/Counseling Complete  Palliative Care Screening Not Applicable  Medication Review Oceanographer) Complete

## 2023-02-20 ENCOUNTER — Inpatient Hospital Stay (HOSPITAL_COMMUNITY): Payer: Medicare (Managed Care)

## 2023-02-20 DIAGNOSIS — S32030A Wedge compression fracture of third lumbar vertebra, initial encounter for closed fracture: Secondary | ICD-10-CM | POA: Diagnosis not present

## 2023-02-20 HISTORY — PX: IR KYPHO LUMBAR INC FX REDUCE BONE BX UNI/BIL CANNULATION INC/IMAGING: IMG5519

## 2023-02-20 LAB — BASIC METABOLIC PANEL
Anion gap: 13 (ref 5–15)
BUN: 36 mg/dL — ABNORMAL HIGH (ref 8–23)
CO2: 20 mmol/L — ABNORMAL LOW (ref 22–32)
Calcium: 8.7 mg/dL — ABNORMAL LOW (ref 8.9–10.3)
Chloride: 91 mmol/L — ABNORMAL LOW (ref 98–111)
Creatinine, Ser: 0.57 mg/dL — ABNORMAL LOW (ref 0.61–1.24)
GFR, Estimated: >60 mL/min (ref >60)
Glucose, Bld: 76 mg/dL (ref 70–99)
Potassium: 5.7 mmol/L — ABNORMAL HIGH (ref 3.5–5.1)
Sodium: 124 mmol/L — ABNORMAL LOW (ref 135–145)

## 2023-02-20 MED ORDER — CEFAZOLIN SODIUM-DEXTROSE 2-4 GM/100ML-% IV SOLN
INTRAVENOUS | Status: AC
  Filled 2023-02-20: qty 100

## 2023-02-20 MED ORDER — MIDAZOLAM HCL 2 MG/2ML IJ SOLN
INTRAMUSCULAR | Status: AC
  Filled 2023-02-20: qty 2

## 2023-02-20 MED ORDER — ACETAMINOPHEN 10 MG/ML IV SOLN
INTRAVENOUS | Status: AC | PRN
  Administered 2023-02-20: 1000 mg via INTRAVENOUS

## 2023-02-20 MED ORDER — SODIUM ZIRCONIUM CYCLOSILICATE 10 G PO PACK
10.0000 g | PACK | Freq: Every day | ORAL | Status: DC
  Administered 2023-02-20 – 2023-02-21 (×2): 10 g via ORAL
  Filled 2023-02-20 (×2): qty 1

## 2023-02-20 MED ORDER — LIDOCAINE HCL (PF) 1 % IJ SOLN
INTRAMUSCULAR | Status: AC
  Filled 2023-02-20: qty 30

## 2023-02-20 MED ORDER — FENTANYL CITRATE (PF) 100 MCG/2ML IJ SOLN
INTRAMUSCULAR | Status: AC
  Filled 2023-02-20: qty 2

## 2023-02-20 MED ORDER — CEFAZOLIN SODIUM-DEXTROSE 2-4 GM/100ML-% IV SOLN
INTRAVENOUS | Status: AC | PRN
Start: 2023-02-20 — End: 2023-02-20
  Administered 2023-02-20: 2 g via INTRAVENOUS

## 2023-02-20 MED ORDER — UREA 15 G PO PACK
15.0000 g | PACK | Freq: Two times a day (BID) | ORAL | Status: DC
  Administered 2023-02-20 – 2023-02-21 (×2): 15 g via ORAL
  Filled 2023-02-20 (×4): qty 1

## 2023-02-20 MED ORDER — HYDROMORPHONE HCL 1 MG/ML IJ SOLN
INTRAMUSCULAR | Status: AC | PRN
  Administered 2023-02-20: 1 mg via INTRAVENOUS

## 2023-02-20 MED ORDER — HYDROMORPHONE HCL 2 MG/ML IJ SOLN
INTRAMUSCULAR | Status: AC
  Filled 2023-02-20: qty 1

## 2023-02-20 MED ORDER — ACETAMINOPHEN 10 MG/ML IV SOLN
INTRAVENOUS | Status: AC
  Filled 2023-02-20: qty 100

## 2023-02-20 MED ORDER — MIDAZOLAM HCL 2 MG/2ML IJ SOLN
INTRAMUSCULAR | Status: AC | PRN
  Administered 2023-02-20 (×2): 1 mg via INTRAVENOUS

## 2023-02-20 NOTE — Progress Notes (Signed)
[facility] Progress Note Patient: Howard Jackson [id number] DOB: Nov 13, 1945 DOA: 02/12/2023  DOS: the patient was seen and examined on 02/20/2023  Brief hospital course: Patient is a 77 year old male with CVA, residual left-sided hemiparesis, HTN, HLD, anxiety, PAD, primary hyper aldosteronism presented to ED with low back pain after a fall. Of note, patient was hospitalized in 6/24 for headache and temporal arteritis workup and has been on steroids for that.    At baseline he is able to rise from sitting and pivot into wheelchair.  He does follow with neurosurgery clinic CT lumbar spine showed acute superior endplate compression deformity at anterior aspect of L3, age-indeterminate  Assessment and Plan: Mechanical fall with intractable back pain secondary to L3  compression fracture Patient lives alone, wheelchair-bound, chronic left-sided weakness from previous stroke MRI lumbar spine showed acute L3 compression fracture with slight height loss. Continue pain control, PT OT evaluation PT eval 8/3 recommended SNF IR consulted for vertebroplasty, recommended to hold off Plavix for 5 days, tentative plan on Friday 8/9.  Will need SNF after kyphoplasty.   Mild hyponatremia Appears to have chronic hyponatremia Urine osmolality 567, urine sodium 145, serum osmolarity 274, likely has SIADH Placed on salt tablets however patient continues to refuse salt tabs. Sodium improved to 129 worsening again to 144.  Will treat with urea.  Also free water restriction.  Mild hyperkalemia. Treat with Lokelma. Hold Aldactone.   HTN (hypertension) BP stable, continue labetalol, nifedipine   History of stroke with left-sided hemiparesis BP stable, continue antihypertensives Plavix held for pending vertebroplasty on Friday 8/9   History of headaches and recent temporal arteritis Continue prednisone   BPH (benign prostatic hyperplasia) Continue Proscar, tamsulosin    Hypercholesterolemia Continue Crestor   Chronic diastolic CHF (congestive heart failure) (HCC) Euvolemic, no acute issues, no recent echocardiogram   Primary hyper aldosteronism Holding Aldactone 100 mg daily due to hyperkalemia.   Cough and congestion Claritin, Flonase spray   Thrombocytopenia. Platelet: 140 at the time of admission trending down to 106.  Etiology not clear. smear evaluation was unremarkable on 8/6. INR normal.  Leukocytosis. X-ray chest and x-ray abdomen unremarkable. Leukocytosis mostly in the setting of steroids.  No concern for systemic infection.  Moderate protein calorie malnutrition Refusing protein shakes. Monitor.   Subjective: No complaint of nausea and vomiting.  Still has cough.  Physical Exam: General: in Mild distress, No Rash Cardiovascular: S1 and S2 Present, No Murmur Respiratory: Good respiratory effort, Bilateral Air entry present. No Crackles, No wheezes Abdomen: Bowel Sound present, No tenderness Extremities: No edema Neuro: Alert and oriented x3, no new focal deficit  Data Reviewed: I have Reviewed nursing notes, Vitals, and Lab results. Reviewed CBC and BMP.  Reorder CBC and BMP.  Disposition: Status is: Inpatient Remains inpatient appropriate because: Awaiting improvement in sodium levels tomorrow.  enoxaparin (LOVENOX) injection 40 mg Start: 02/20/23 2200 SCDs Start: 02/12/23 1756   Family Communication: Daughter at bedside Level of care: Med-Surg   Vitals:   02/20/23 1245 02/20/23 1250 02/20/23 1255 02/20/23 1317  BP: (!) 140/74 (!) 140/74 (!) 148/78 123/70  Pulse: 67 65 67 60  Resp: 13 12 13 18   Temp:      TempSrc:      SpO2: 100% 99% 100% 96%  Weight:      Height:         Author: Lynden Oxford, MD 02/20/2023 7:32 PM  Please look on [email or web address] to find out who is on call.

## 2023-02-20 NOTE — Procedures (Signed)
Interventional Radiology Procedure Note  Procedure: Image guided L3 kyphoplasty  Findings: Please refer to procedural dictation for full description. Bipedicular access.  Complications: None immediate   Estimated Blood Loss: < 5 mL  Recommendations: Strict 2 hour bedrest, then ambulate as tolerated. IR will follow.   Marliss Coots, MD

## 2023-02-20 NOTE — TOC Progression Note (Signed)
Transition of Care [facility]) - Progression Note   Patient Details  Name: Howard Jackson MRN: [id number] Date of Birth: 06-21-1946  Transition of Care [facility]) CM/SW Contact  Ewing Schlein, LCSW Phone Number: 02/20/2023, 2:38 PM  Clinical Narrative: Patient has been authorized for [facility] and [address] Patient will not discharge today due to low sodium and CSW confirmed with Velna Hatchet in admissions at [facility] that the facility cannot do a weekend admission, so patient will admit Monday (02/23/23). CSW updated [place] with [facility] and both of patient's daughters.  Expected Discharge Plan: Skilled Nursing Facility Barriers to Discharge: English as a second language teacher, Continued Medical Work up  Expected Discharge Plan and Services In-house Referral: Clinical Social Work Post Acute Care Choice: Skilled Nursing Facility Living arrangements for the past 2 months: Single Family Home  Social Determinants of Health (SDOH) Interventions SDOH Screenings   Food Insecurity: Food Insecurity Present (02/12/2023)  Housing: Low Risk  (02/12/2023)  Transportation Needs: No Transportation Needs (02/12/2023)  Utilities: Not At Risk (02/12/2023)  Social Connections: Unknown (11/25/2021)   Received from [facility], [facility]  Tobacco Use: Low Risk  (02/12/2023)   Readmission Risk Interventions    02/16/2023    3:07 PM  Readmission Risk Prevention Plan  Transportation Screening Complete  PCP or Specialist Appt within 3-5 Days Complete  HRI or Home Care Consult Complete  Social Work Consult for Recovery Care Planning/Counseling Complete  Palliative Care Screening Not Applicable  Medication Review Oceanographer) Complete

## 2023-02-21 DIAGNOSIS — S32030A Wedge compression fracture of third lumbar vertebra, initial encounter for closed fracture: Secondary | ICD-10-CM | POA: Diagnosis not present

## 2023-02-21 MED ORDER — CLOPIDOGREL BISULFATE 75 MG PO TABS
75.0000 mg | ORAL_TABLET | Freq: Every day | ORAL | Status: DC
  Administered 2023-02-21 – 2023-02-23 (×3): 75 mg via ORAL
  Filled 2023-02-21 (×3): qty 1

## 2023-02-21 MED ORDER — MORPHINE SULFATE (PF) 2 MG/ML IV SOLN: 2.0000 mg | INTRAVENOUS | Status: DC | PRN

## 2023-02-21 MED ORDER — ACETAMINOPHEN 500 MG PO TABS: 1000.0000 mg | ORAL_TABLET | Freq: Once | ORAL | Status: DC

## 2023-02-21 MED ORDER — SPIRONOLACTONE 25 MG PO TABS
25.0000 mg | ORAL_TABLET | Freq: Every day | ORAL | Status: DC
  Administered 2023-02-21 – 2023-02-23 (×3): 25 mg via ORAL
  Filled 2023-02-21 (×3): qty 1

## 2023-02-21 NOTE — Progress Notes (Signed)
[facility] Progress Note Patient: Howard Jackson [id number] DOB: 1945-07-30 DOA: 02/12/2023  DOS: the patient was seen and examined on 02/21/2023  Brief hospital course: Patient is a 77 year old male with CVA, residual left-sided hemiparesis, HTN, HLD, anxiety, PAD, primary hyper aldosteronism presented to ED with low back pain after a fall. Of note, patient was hospitalized in 6/24 for headache and temporal arteritis workup and has been on steroids for that.    At baseline he is able to rise from sitting and pivot into wheelchair.  He does follow with neurosurgery clinic CT lumbar spine showed acute superior endplate compression deformity at anterior aspect of L3, age-indeterminate  Assessment and Plan: Mechanical fall with intractable back pain secondary to L3  compression fracture Patient lives alone, wheelchair-bound, chronic left-sided weakness from previous stroke MRI lumbar spine showed acute L3 compression fracture with slight height loss. Continue pain control, PT OT evaluation PT eval 8/3 recommended SNF IR consulted for vertebroplasty, recommended to hold off Plavix for 5 days,  Underwent kyphoplasty on 8/9.  Doing well after the procedure.  Continue pain control.   Hyponatremia-likely SIADH and poor p.o. intake Appears to have chronic hyponatremia Urine osmolality 567, urine sodium 145, serum osmolarity 274, likely has SIADH Placed on salt tablets however patient continues to refuse salt tabs. Sodium improved to 129 worsening again to 124. Will treat with urea.  Also free water restriction.  Mild hyperkalemia. Treat with Lokelma.   HTN (hypertension) BP stable, continue labetalol, nifedipine   History of stroke with left-sided hemiparesis BP stable, continue antihypertensives Plavix held for pending vertebroplasty on Friday 8/9   History of headaches and recent temporal arteritis Continue prednisone   BPH (benign prostatic hyperplasia) Continue  Proscar, tamsulosin   Hypercholesterolemia Continue Crestor   Chronic diastolic CHF (congestive heart failure) (HCC) Euvolemic, no acute issues, no recent echocardiogram   Primary hyper aldosteronism Was on Aldactone 100 mg daily  Dose reduced due to hyperkalemia.   Cough and congestion Claritin, Flonase spray   Thrombocytopenia. Platelet: 140 at the time of admission trending down to 106.  Etiology not clear. smear evaluation was unremarkable on 8/6. INR normal.  Leukocytosis. X-ray chest and x-ray abdomen unremarkable. Leukocytosis mostly in the setting of steroids.  No concern for systemic infection.  Moderate protein calorie malnutrition Refusing protein shakes. Monitor.   Subjective: Reports headache reports back pain.  No nausea or vomiting.  Physical Exam: General: in Mild distress, No Rash Cardiovascular: S1 and S2 Present, No Murmur Respiratory: Good respiratory effort, Bilateral Air entry present.  Basal crackles, No wheezes Abdomen: Bowel Sound present, No tenderness Extremities: No edema Neuro: Alert and oriented x3, no new focal deficit  Data Reviewed: I have Reviewed nursing notes, Vitals, and Lab results. Since last encounter, pertinent lab results BMP   . I have ordered test including BMP  .   Disposition: Status is: Inpatient Remains inpatient appropriate because: Awaiting correction of sodium level.  enoxaparin (LOVENOX) injection 40 mg Start: 02/20/23 2200 SCDs Start: 02/12/23 1756   Family Communication: No one at bedside Level of care: Med-Surg   Vitals:   02/20/23 1317 02/20/23 2116 02/21/23 0545 02/21/23 1327  BP: 123/70 134/77 (!) 143/89 (!) 142/84  Pulse: 60 77 92 91  Resp: 18 18 17 17   Temp:  97.8 F (36.6 C) 98 F (36.7 C) 98.2 F (36.8 C)  TempSrc:  Oral Oral   SpO2: 96% 95% 99% 96%  Weight:      Height:  Author: Lynden Oxford, MD 02/21/2023 5:23 PM  Please look on [email or web address] to find out who is on call.

## 2023-02-21 NOTE — Progress Notes (Signed)
Physical Therapy Treatment Patient Details Name: Howard Jackson MRN: [id number] DOB: 04/06/1946 Today's Date: 02/21/2023   History of Present Illness 77 y.o. male admitted with Intractable low back pain due to L3 compression fracture s/p mechanical fall. IR consulted and pt s/p L3 Kyphoplasty on 8/9. Recent admission 01/05/23 for temporal arteritis.  PMH: CVA with residual left sided hemiparesis, HTN, HLD, anxiety, PAD,  primary aldosteronism    PT Comments  Pt agreeable for OOB.  Pt assisted with bed mobility and stand pivot transfer to recliner.  Pt requiring mod assist to transfer at this time.  Current d/c plan remains appropriate.  Patient will benefit from continued inpatient follow up therapy, <3 hours/day.     If plan is discharge home, recommend the following: Assist for transportation;Help with stairs or ramp for entrance;Assistance with cooking/housework;A lot of help with walking and/or transfers;A lot of help with bathing/dressing/bathroom   Can travel by private vehicle     No  Equipment Recommendations  None recommended by PT    Recommendations for Other Services       Precautions / Restrictions Precautions Precautions: Fall;Back Precaution Comments: CVA L Hemi 2019     Mobility  Bed Mobility Overal bed mobility: Needs Assistance Bed Mobility: Supine to Sit     Supine to sit: Mod assist, Used rails     General bed mobility comments: required increased time with use of rail with R UE and HOB elevated.  L UE flexor tone and non functional.  assist required for left upper body and scooting to EOB    Transfers Overall transfer level: Needs assistance Equipment used: None Transfers: Sit to/from Stand, Bed to chair/wheelchair/BSC Sit to Stand: Mod assist, From elevated surface Stand pivot transfers: Mod assist         General transfer comment: assist to rise and steady, pt transferring to right side, uses R UE to hold rail and then armrests to self  assist, mod assist for stability and weakness with pivot and controlling descent (therapist donned pt's shoes from home prior to standing)    Ambulation/Gait               General Gait Details: pt unable at baseline   Stairs             Wheelchair Mobility     Tilt Bed    Modified Rankin (Stroke Patients Only)       Balance Overall balance assessment: Needs assistance Sitting-balance support: No upper extremity supported Sitting balance-Leahy Scale: Fair     Standing balance support: Single extremity supported, During functional activity Standing balance-Leahy Scale: Poor Standing balance comment: reliant on Rt UE support and external assist                            Cognition Arousal: Alert Behavior During Therapy: WFL for tasks assessed/performed Overall Cognitive Status: Within Functional Limits for tasks assessed                                          Exercises      General Comments        Pertinent Vitals/Pain Pain Assessment Pain Assessment: Faces Faces Pain Scale: Hurts little more Pain Location: back Pain Descriptors / Indicators: Grimacing, Sore Pain Intervention(s): Repositioned, Monitored during session (declines pain meds)    Home Living  Prior Function            PT Goals (current goals can now be found in the care plan section) Progress towards PT goals: Progressing toward goals    Frequency    Min 1X/week      PT Plan Current plan remains appropriate    Co-evaluation              AM-PAC PT "6 Clicks" Mobility   Outcome Measure  Help needed turning from your back to your side while in a flat bed without using bedrails?: A Lot Help needed moving from lying on your back to sitting on the side of a flat bed without using bedrails?: A Lot Help needed moving to and from a bed to a chair (including a wheelchair)?: A Lot Help needed standing up  from a chair using your arms (e.g., wheelchair or bedside chair)?: A Lot Help needed to walk in hospital room?: Total Help needed climbing 3-5 steps with a railing? : Total 6 Click Score: 10    End of Session Equipment Utilized During Treatment: Gait belt Activity Tolerance: Patient tolerated treatment well Patient left: in chair;with call bell/phone within reach;with chair alarm set Nurse Communication: Mobility status PT Visit Diagnosis: Other abnormalities of gait and mobility (R26.89);History of falling (Z91.81)     Time: 4132-4401 PT Time Calculation (min) (ACUTE ONLY): 17 min  Charges:    $Therapeutic Activity: 8-22 mins PT General Charges $$ ACUTE PT VISIT: 1 Visit                     Thomasene Mohair PT, DPT Physical Therapist Acute Rehabilitation Services Office: [phone]    Janan Halter [place] 02/21/2023, 3:11 PM

## 2023-02-21 NOTE — Progress Notes (Signed)
Patient reports pain and declines to take Hydrocodone or Morphine at this time. He reports knowing people who have had cancer and taken strong medications. He is concerned about side effects or death from taking strong medication. Will continue to provide education, environmental interventions, and Tylenol when appropriate. Haydee Salter, RN 02/21/23 3:00 PM

## 2023-02-21 NOTE — Plan of Care (Signed)
  Problem: Education: Goal: Knowledge of General Education information will improve Description: Including pain rating scale, medication(s)/side effects and non-pharmacologic comfort measures Outcome: Progressing   Problem: Clinical Measurements: Goal: Ability to maintain clinical measurements within normal limits will improve Outcome: Progressing   Problem: Nutrition: Goal: Adequate nutrition will be maintained Outcome: Progressing   Problem: Pain Managment: Goal: General experience of comfort will improve Outcome: Progressing   Problem: Safety: Goal: Ability to remain free from injury will improve Outcome: Progressing   

## 2023-02-22 DIAGNOSIS — S32030A Wedge compression fracture of third lumbar vertebra, initial encounter for closed fracture: Secondary | ICD-10-CM | POA: Diagnosis not present

## 2023-02-22 LAB — BASIC METABOLIC PANEL
Anion gap: 10 (ref 5–15)
BUN: 43 mg/dL — ABNORMAL HIGH (ref 8–23)
CO2: 22 mmol/L (ref 22–32)
Calcium: 8.2 mg/dL — ABNORMAL LOW (ref 8.9–10.3)
Chloride: 97 mmol/L — ABNORMAL LOW (ref 98–111)
Creatinine, Ser: 0.64 mg/dL (ref 0.61–1.24)
GFR, Estimated: >60 mL/min (ref >60)
Glucose, Bld: 206 mg/dL — ABNORMAL HIGH (ref 70–99)
Potassium: 4.4 mmol/L (ref 3.5–5.1)
Sodium: 129 mmol/L — ABNORMAL LOW (ref 135–145)

## 2023-02-22 LAB — MAGNESIUM: Magnesium: 1.8 mg/dL (ref 1.7–2.4)

## 2023-02-22 MED ORDER — BISACODYL 10 MG RE SUPP
10.0000 mg | Freq: Every day | RECTAL | Status: DC | PRN
  Administered 2023-02-22: 10 mg via RECTAL
  Filled 2023-02-22: qty 1

## 2023-02-22 MED ORDER — SENNOSIDES-DOCUSATE SODIUM 8.6-50 MG PO TABS
2.0000 | ORAL_TABLET | Freq: Two times a day (BID) | ORAL | Status: DC
  Administered 2023-02-22 – 2023-02-23 (×2): 2 via ORAL
  Filled 2023-02-22 (×2): qty 2

## 2023-02-22 MED ORDER — BISACODYL 5 MG PO TBEC: 5.0000 mg | DELAYED_RELEASE_TABLET | Freq: Every day | ORAL | Status: DC | PRN

## 2023-02-22 NOTE — Progress Notes (Signed)
Referring Physician(s): Dr Leodis Binet  Supervising Physician: Richarda Overlie  Patient Status:  [facility] - In-pt  Chief Complaint:  Back pain L3 compression fracture  Subjective:  L3 KP performed in IR 02/20/23--- Dr Elby Showers Pt is up in bed Pain less but still sore Able to move in bed eaeier  Allergies: Amitriptyline hcl, Other, Losartan potassium, Papaya derivatives, and Sulfa antibiotics  Medications: Prior to Admission medications   Medication Sig Start Date End Date Taking? Authorizing Provider  acetaminophen (TYLENOL) 500 MG tablet Take 500 mg by mouth every 6 (six) hours as needed for moderate pain or mild pain.   Yes [provider]  Boswellia Serrata (BOSWELLIA PO) Take 1 capsule by mouth daily as needed (for pain).   Yes [provider]  Cholecalciferol (VITAMIN D3) 125 MCG (5000 UT) CAPS Take 5,000 Units by mouth every other day.   Yes [provider]  CITRUS BERGAMOT PO Take 1 capsule by mouth daily as needed (for pain).   Yes [provider]  clopidogrel (PLAVIX) 75 MG tablet TAKE 1 TABLET BY MOUTH EVERY DAY Patient taking differently: Take 75 mg by mouth daily. 10/28/21  Yes Croitoru, Mihai, MD  Coenzyme Q10-Vitamin E (QUNOL ULTRA COQ10) 100-150 MG-UNIT CAPS Take 1 capsule by mouth daily.   Yes [provider]  finasteride (PROSCAR) 5 MG tablet Take 5 mg by mouth daily. 01/19/18  Yes [provider]  labetalol (NORMODYNE) 300 MG tablet TAKE 1 TABLET BY MOUTH 2 TIMES DAILY. Patient taking differently: Take 300 mg by mouth 2 (two) times daily. 05/08/21  Yes Croitoru, Mihai, MD  NIFEdipine (ADALAT CC) 30 MG 24 hr tablet Take 30 mg by mouth in the morning.   Yes [provider]  NIFEdipine (ADALAT CC) 60 MG 24 hr tablet Take 1 tablet (60 mg total) by mouth every evening. 04/29/22  Yes Croitoru, Mihai, MD  NON FORMULARY Take 1-2 capsules by mouth See admin instructions. Nitric Oxide Beet Root Capsules- Take 1-2 capsules by  mouth once a day   Yes [provider]  NON FORMULARY Take 14 g by mouth See admin instructions. MCT Oil - Keto & Vegan MCTs C8, C10 from Coconuts- Mix 14 grams into a desired drink or food and consume by mouth once a day   Yes [provider]  NON FORMULARY Take 1 capsule by mouth See admin instructions. [place] Rhubarb - Herbal Supplement for Digestive Health - Natural Formula- Take 1 capsule by mouth once a day as needed to support digestive health   Yes [provider]  oxyCODONE-acetaminophen (PERCOCET/ROXICET) 5-325 MG tablet Take 1 tablet by mouth every 8 (eight) hours as needed for up to 15 doses for severe pain. 02/12/23  Yes Terald Sleeper, MD  pantoprazole (PROTONIX) 40 MG tablet Take 1 tablet (40 mg total) by mouth daily. Take along with prednisone 12/19/22  Yes Sharl Ma, Sarina Ill, MD  rosuvastatin (CRESTOR) 20 MG tablet TOME UNA TABLETA TODOS LOS DIAS Patient taking differently: Take 20 mg by mouth daily. 01/28/21  Yes Croitoru, Mihai, MD  senna-docusate (SENOKOT-S) 8.6-50 MG tablet Take 3 tablets by mouth every evening. Patient taking differently: Take 3 tablets by mouth daily as needed for mild constipation. 04/20/18  Yes Briant Cedar, MD  senna-docusate (SENOKOT-S) 8.6-50 MG tablet Take 1 tablet by mouth daily as needed for up to 15 days for mild constipation (To prevent constipation from percocet narcotic). 02/12/23 02/27/23 Yes Trifan, Kermit Balo, MD  spironolactone (ALDACTONE) 100  MG tablet Take 1 tablet (100 mg total) by mouth daily. 04/29/22  Yes Croitoru, Mihai, MD  tamsulosin (FLOMAX) 0.4 MG CAPS capsule Take 1 capsule (0.4 mg total) by mouth daily. Patient taking differently: Take 0.4 mg by mouth in the morning and at bedtime. 01/09/17  Yes Massie Maroon, FNP  tiZANidine (ZANAFLEX) 2 MG tablet Take 2 mg by mouth daily as needed for muscle spasms.   Yes [provider]  triamcinolone ointment (KENALOG) 0.1 % Apply 1 Application topically 2 (two)  times daily as needed (for dryness).   Yes [provider]  lactobacillus acidophilus (BACID) TABS tablet Take 2 tablets by mouth 2 (two) times daily. Patient taking differently: Take 2 tablets by mouth daily. 04/20/18   Briant Cedar, MD  magnesium hydroxide (MILK OF MAGNESIA) 400 MG/5ML suspension Take 15-30 mLs by mouth daily as needed for mild constipation.    [provider]     Vital Signs: BP 126/89 (BP Location: Right Arm)   Pulse 91   Temp 98.3 F (36.8 C) (Oral)   Resp 18   Ht 5\' 7"  (1.702 m)   Wt 129 lb (58.5 kg)   SpO2 97%   BMI 20.20 kg/m   Physical Exam Vitals reviewed.  Skin:    General: Skin is warm.     Comments: Site is clean and dry NT no bleeding No hematoma   Neurological:     Mental Status: He is alert.     Imaging: IR KYPHO LUMBAR INC FX REDUCE BONE BX UNI/BIL CANNULATION INC/IMAGING  Result Date: 02/20/2023 CLINICAL DATA:  77 year old male with history of traumatic L3 vertebral body fracture. EXAM: FLUOROSCOPIC GUIDED KYPHOPLASTY OF THE L3 VERTEBRAL BODY COMPARISON:  None Available. MEDICATIONS: As antibiotic prophylaxis, Ancef 2 gm IV was ordered pre-procedure and administered intravenously within 1 hour of incision. ANESTHESIA/SEDATION: Moderate (conscious) sedation was employed during this procedure. A total of Versed 2 mg and dilaudid 1 mcg was administered intravenously. Moderate Sedation Time: 33 minutes. The patient's level of consciousness and vital signs were monitored continuously by radiology nursing throughout the procedure under my direct supervision. FLUOROSCOPY TIME:  One hundred sixty-seven mGy COMPLICATIONS: None immediate. PROCEDURE: The procedure, risks (including but not limited to bleeding, infection, organ damage), benefits, and alternatives were explained to the patient. Questions regarding the procedure were encouraged and answered. The patient understands and consents to the procedure. The patient has suffered  a fracture of the L3 vertebral body. It is recommended that patients aged 30 years or older be evaluated for possible testing or treatment of osteoporosis. A copy of this procedure report is sent to the patient's referring physician The patient was placed prone on the fluoroscopic table. The skin overlying the lumbar region was then prepped and draped in the usual sterile fashion. Maximal barrier sterile technique was utilized including caps, mask, sterile gowns, sterile gloves, sterile drape, hand hygiene and skin antiseptic. Intravenous Fentanyl and Versed were administered as conscious sedation during continuous cardiorespiratory monitoring by the radiology RN. The left pedicle at L3 was then infiltrated with 1% lidocaine followed by the advancement of a Kyphon trocar needle through the left pedicle into the posterior one-third of the vertebral body. Subsequently, the osteo drill was advanced to the anterior third of the vertebral body. The osteo drill was retracted. Through the working cannula, a Kyphon inflatable bone tamp 15 x 3 was advanced and positioned with the distal marker approximately 5 mm from the anterior aspect of the cortex. Appropriate  positioning was confirmed on the AP projection. At this time, the balloon was expanded using contrast via a Kyphon inflation syringe device via micro tubing. In similar fashion, the right L3 pedicle was infiltrated with 1% lidocaine followed by the advancement of a second Kyphon trocar needle through the right pedicle into the posterior third of the vertebral body. Subsequently, the osteo drill was coaxially advanced to the anterior right third. The osteo drill was exchanged for a Kyphon inflatable bone tamp 15 x 3, advanced to the 5 mm of the anterior aspect of the cortex. The balloon was then expanded using contrast as above. Inflations were continued until there was near apposition with the superior end plate. At this time, methylmethacrylate mixture was  reconstituted in the Kyphon bone mixing device system. This was then loaded into the delivery mechanism, attached to Kyphon bone fillers. The balloons were deflated and removed followed by the instillation of methylmethacrylate mixture with excellent filling in the AP and lateral projections. There is mild extravasation of the cement along the left lateral border of the vertebral body through the fracture deformity. No extravasation was noted in the disk spaces or posteriorly into the spinal canal. No epidural venous contamination was seen. The working cannulae and the bone filler were then retrieved and removed. Hemostasis was achieved with manual compression. The patient tolerated the procedure well without immediate postprocedural complication. IMPRESSION: 1. Technically successful L3 vertebral body augmentation using balloon kyphoplasty. 2. Per CMS PQRS reporting requirements (PQRS Measure 24): Given the patient's age of greater than 50 and the fracture site (hip, distal radius, or spine), the patient should be tested for osteoporosis using DXA, and the appropriate treatment considered based on the DXA results. Marliss Coots, MD [facility] Electronically Signed   By: Marliss Coots M.D.   On: 02/20/2023 15:10   DG Abd Portable 1V  Result Date: 02/19/2023 CLINICAL DATA:  [id number] Abdominal discomfort [id number] EXAM: PORTABLE ABDOMEN - 1 VIEW COMPARISON:  10/19/2020. FINDINGS: The bowel gas pattern is non-obstructive. There is small-to-moderate stool burden, including the ascending colon, compatible with colonic hypomotility. No evidence of pneumoperitoneum, within the limitations of a supine film. No acute osseous abnormalities. The soft tissues are within normal limits. Surgical changes, devices, tubes and lines: Partially seen left hip arthroplasty. IMPRESSION: Nonobstructive bowel gas pattern. Electronically Signed   By: Jules Schick M.D.   On:  02/19/2023 13:39   DG CHEST PORT 1 VIEW  Result Date: 02/19/2023 CLINICAL DATA:  [id number] SOB (shortness of breath) [id number] EXAM: PORTABLE CHEST 1 VIEW COMPARISON:  02/17/2023. FINDINGS: There is blunting of left lateral costophrenic angle with associated probable linear areas of atelectasis/scarring at the left lung base. Bilateral lungs otherwise clear. No acute consolidation or major lung collapse. Right lateral costophrenic angle is clear. Normal cardio-mediastinal silhouette. Aortic arch calcifications noted.  Coronary artery stent noted. No acute osseous abnormalities. The soft tissues are within normal limits. IMPRESSION: Small left pleural effusion with associated probable linear areas of atelectasis/scarring in the left lung base. Electronically Signed   By: Jules Schick M.D.   On: 02/19/2023 13:37    Labs:  CBC: Recent Labs    02/18/23 0333 02/19/23 0921 02/20/23 0342 02/21/23 0325  WBC 11.5* 14.6* 16.8* 12.5*  HGB 15.6 16.6 17.0 17.3*  HCT 46.8 51.2 50.6 50.8  PLT 106* 108* 122* 134*    COAGS: Recent Labs    02/17/23 1056  INR 1.0    BMP: Recent Labs  02/20/23 0342 02/20/23 1558 02/21/23 0325 02/22/23 0915  NA 124* 124* 124* 129*  K 4.9 5.7* 4.4 4.4  CL 91* 91* 92* 97*  CO2 21* 20* 20* 22  GLUCOSE 175* 76 273* 206*  BUN 37* 36* 49* 43*  CALCIUM 8.8* 8.7* 8.4* 8.2*  CREATININE 0.55* 0.57* 0.90 0.64  GFRNONAA >60 >60 >60 >60    LIVER FUNCTION TESTS: Recent Labs    02/13/23 0345 02/19/23 1313 02/20/23 0342 02/21/23 0325  BILITOT 1.0 0.5 0.5 0.6  AST 19 37 24 19  ALT 39 156* 139* 95*  ALKPHOS 46 67 65 86  PROT 5.0* 5.5* 5.6* 5.4*  ALBUMIN 3.0* 2.8* 2.8* 2.9*    Assessment and Plan:  Less pain post Lumbar 3 KP 8/9 Call IR if any issues  Electronically Signed: Robet Leu, PA-C 02/22/2023, 11:36 AM   I spent a total of 15 Minutes at the the patient's bedside AND on the patient's hospital floor or unit, greater than 50% of which was  counseling/coordinating care for L3 KP

## 2023-02-22 NOTE — Plan of Care (Signed)
  Problem: Education: Goal: Knowledge of General Education information will improve Description: Including pain rating scale, medication(s)/side effects and non-pharmacologic comfort measures Outcome: Progressing   Problem: Clinical Measurements: Goal: Ability to maintain clinical measurements within normal limits will improve Outcome: Progressing   Problem: Nutrition: Goal: Adequate nutrition will be maintained Outcome: Progressing   Problem: Pain Managment: Goal: General experience of comfort will improve Outcome: Progressing   Problem: Safety: Goal: Ability to remain free from injury will improve Outcome: Progressing   

## 2023-02-22 NOTE — Progress Notes (Signed)
[facility] Progress Note Patient: Howard Jackson ZOX:[id number] DOB: 05-23-1946 DOA: 02/12/2023  DOS: the patient was seen and examined on 02/22/2023  Brief hospital course: Patient is a 77 year old male with CVA, residual left-sided hemiparesis, HTN, HLD, anxiety, PAD, primary hyper aldosteronism presented to ED with low back pain after a fall. Of note, patient was hospitalized in 6/24 for headache and temporal arteritis workup and has been on steroids for that.    At baseline he is able to rise from sitting and pivot into wheelchair.  He does follow with neurosurgery clinic CT lumbar spine showed acute superior endplate compression deformity at anterior aspect of L3, age-indeterminate  Assessment and Plan: Mechanical fall with intractable back pain secondary to L3  compression fracture Patient lives alone, wheelchair-bound, chronic left-sided weakness from previous stroke MRI lumbar spine showed acute L3 compression fracture with slight height loss. Continue pain control, PT OT evaluation PT eval 8/3 recommended SNF IR consulted for vertebroplasty, recommended to hold off Plavix for 5 days,  Underwent kyphoplasty on 8/9.  Doing well after the procedure.  Continue pain control.   Hyponatremia-likely SIADH and poor p.o. intake Appears to have chronic hyponatremia Urine osmolality 567, urine sodium 145, serum osmolarity 274, likely has SIADH Placed on salt tablets however patient continues to refuse salt tabs. Sodium improved to 129 worsening again to 124.  Improved again to 129.  Refusing urea.  Continue free water restriction.  Mild hyperkalemia. Treat with Lokelma.   HTN (hypertension) BP stable, continue labetalol, nifedipine   History of stroke with left-sided hemiparesis BP stable, continue antihypertensives Plavix held for pending vertebroplasty on Friday 8/9   History of headaches and recent temporal arteritis Continue prednisone   BPH (benign prostatic  hyperplasia) Continue Proscar, tamsulosin   Hypercholesterolemia Continue Crestor   Chronic diastolic CHF (congestive heart failure) (HCC) Euvolemic, no acute issues, no recent echocardiogram   Primary hyper aldosteronism Was on Aldactone 100 mg daily  Dose reduced due to hyperkalemia.   Cough and congestion Claritin, Flonase spray   Thrombocytopenia. Platelet: 140 at the time of admission trending down to 106.  Etiology not clear. smear evaluation was unremarkable on 8/6. INR normal.  Leukocytosis. X-ray chest and x-ray abdomen unremarkable. Leukocytosis mostly in the setting of steroids.  No concern for systemic infection.  Moderate protein calorie malnutrition Refusing protein shakes. Monitor.   Subjective: Pain well-controlled.  No nausea or vomiting.  Reports constipation.  Physical Exam: General: in Mild distress, No Rash Cardiovascular: S1 and S2 Present, No Murmur Respiratory: Good respiratory effort, Bilateral Air entry present. No Crackles, No wheezes Abdomen: Bowel Sound present, No tenderness Extremities: No edema Neuro: Alert and oriented x3, no new focal deficit  Data Reviewed: I have Reviewed nursing notes, Vitals, and Lab results. Since last encounter, pertinent lab results CBC and BMP   .  Disposition: Status is: Inpatient Remains inpatient appropriate because: Awaiting placement tomorrow.  enoxaparin (LOVENOX) injection 40 mg Start: 02/20/23 2200 SCDs Start: 02/12/23 1756   Family Communication: No one at bedside Level of care: Med-Surg   Vitals:   02/22/23 0624 02/22/23 0932 02/22/23 0933 02/22/23 1341  BP: 134/80 126/89 126/89 128/73  Pulse: 80  91 85  Resp: 18  18 14   Temp: 97.7 F (36.5 C)  98.3 F (36.8 C) 97.8 F (36.6 C)  TempSrc:   Oral Oral  SpO2: 100%  97% 98%  Weight:      Height:         Author: Lynden Oxford,  MD 02/22/2023 5:40 PM  Please look on [email or web address] to find out who is on call.

## 2023-02-23 DIAGNOSIS — S32030A Wedge compression fracture of third lumbar vertebra, initial encounter for closed fracture: Secondary | ICD-10-CM | POA: Diagnosis not present

## 2023-02-23 MED ORDER — POLYETHYLENE GLYCOL 3350 17 G PO PACK: 17.0000 g | PACK | Freq: Every day | ORAL | 0 refills | Status: DC

## 2023-02-23 MED ORDER — PROSOURCE PLUS PO LIQD: 30.0000 mL | Freq: Two times a day (BID) | ORAL | 0 refills | Status: AC

## 2023-02-23 MED ORDER — HYDROCODONE-ACETAMINOPHEN 5-325 MG PO TABS: 1.0000 | ORAL_TABLET | ORAL | 0 refills | Status: DC | PRN

## 2023-02-23 MED ORDER — BISACODYL 10 MG RE SUPP: 10.0000 mg | Freq: Every day | RECTAL | 0 refills | Status: DC | PRN

## 2023-02-23 MED ORDER — TIZANIDINE HCL 2 MG PO TABS: 2.0000 mg | ORAL_TABLET | Freq: Every day | ORAL | 0 refills | Status: AC | PRN

## 2023-02-23 MED ORDER — LABETALOL HCL 200 MG PO TABS: 200.0000 mg | ORAL_TABLET | Freq: Two times a day (BID) | ORAL | 0 refills | Status: DC

## 2023-02-23 MED ORDER — SPIRONOLACTONE 50 MG PO TABS: 50.0000 mg | ORAL_TABLET | Freq: Every day | ORAL | 0 refills | Status: AC

## 2023-02-23 MED ORDER — GERHARDT'S BUTT CREAM: 1.0000 | TOPICAL_CREAM | CUTANEOUS | 0 refills | Status: DC | PRN

## 2023-02-23 NOTE — Discharge Summary (Signed)
Physician Discharge Summary   Patient: Howard Jackson MRN: [id number] DOB: 1946/01/16  Admit date:     02/12/2023  Discharge date: 02/23/23  Discharge Physician: Lynden Oxford  PCP: [facility]  Recommendations at discharge:  Follow up as recommended  Repeat BMP in 1 week   Contact information for follow-up providers     Barnett Abu, MD. Schedule an appointment as soon as possible for a visit .   Specialty: Neurosurgery Contact information: [address] [place] [address] [phone]         [facility] Emergency Department at [facility]. Go to .   Specialty: Emergency Medicine Why: If symptoms worsen Contact information: [address] [place] [address] [phone]        [employer] planner). Call.   Contact information: [address] [place], [place] [address] [phone]        [facility] Follow up.   Contact information: Located in: [facility] [address] [place], [place] [address] [phone]        [employer] [facility] Follow up.   Contact information: [address] [place], [place] [address] [phone]             Contact information for after-discharge care     Destination     [facility] Preferred SNF .   Service: Skilled Nursing Contact information: [address] [place] [address] [phone]                    Discharge Diagnoses: Principal Problem:   Lumbar compression fracture (HCC) Active Problems:   HTN (hypertension)   History of stroke   BPH (benign prostatic hyperplasia)   Hypercholesterolemia   Chronic diastolic CHF (congestive heart failure) (HCC)   Malnutrition of moderate degree  Brief hospital course: Patient is a 77 year old male with CVA, residual left-sided hemiparesis, HTN, HLD, anxiety, PAD,  primary hyper aldosteronism presented to ED with low back pain after a fall. Of note, patient was hospitalized in 6/24 for headache and temporal arteritis workup and has been on steroids for that.    At baseline he is able to rise from sitting and pivot into wheelchair.  He does follow with neurosurgery clinic CT lumbar spine showed acute superior endplate compression deformity at anterior aspect of L3, age-indeterminate  Assessment and Plan: Mechanical fall with intractable back pain secondary to L3  compression fracture Patient lives alone, wheelchair-bound, chronic left-sided weakness from previous stroke MRI lumbar spine showed acute L3 compression fracture with slight height loss. Continue pain control, PT OT evaluation PT eval 8/3 recommended SNF IR consulted for vertebroplasty, recommended to hold off Plavix for 5 days,  Underwent kyphoplasty on 8/9.  Doing well after the procedure.  Continue pain control.   Hyponatremia-likely SIADH and poor p.o. intake Appears to have chronic hyponatremia Urine osmolality 567, urine sodium 145, serum osmolarity 274, likely has SIADH Placed on salt tablets however patient continues to refuse salt tabs. Sodium improved to 129 worsening again to 124.  Improved again to 129.  Refusing urea.  Continue free water restriction.  Mild hyperkalemia. Treat with Lokelma.   HTN (hypertension) BP stable, continue labetalol, nifedipine   History of stroke with left-sided hemiparesis BP stable, continue antihypertensives Plavix held for pending vertebroplasty on Friday 8/9   History of headaches and recent temporal arteritis Continue prednisone  BPH (benign prostatic hyperplasia) Continue Proscar, tamsulosin   Hypercholesterolemia Continue Crestor   Chronic diastolic CHF (congestive heart failure) (HCC) Euvolemic, no acute issues, no recent echocardiogram   Primary hyper aldosteronism Was on Aldactone 100 mg daily  Dose reduced due to  hyperkalemia.   Cough and congestion Claritin, Flonase spray   Thrombocytopenia. Platelet: 140 at the time of admission trending down to 106.  Etiology not clear. smear evaluation was unremarkable on 8/6. INR normal.  Leukocytosis. X-ray chest and x-ray abdomen unremarkable. Leukocytosis mostly in the setting of steroids.  No concern for systemic infection.  Moderate protein calorie malnutrition Refusing protein shakes. Monitor.  Pain control - [employer] Controlled Substance Reporting System database was reviewed. and patient was instructed, not to drive, operate heavy machinery, perform activities at heights, swimming or participation in water activities or provide baby-sitting services while on Pain, Sleep and Anxiety Medications; until their outpatient Physician has advised to do so again. Also recommended to not to take more than prescribed Pain, Sleep and Anxiety Medications.  Consultants:  IR  Procedures performed:  Kyphoplasty  DISCHARGE MEDICATION: Allergies as of 02/23/2023       Reactions   Amitriptyline Hcl Hypertension   Other Shortness Of Breath, Other (See Comments)   SKIN is sensitive- Guards for the underwear MUST be removed immediately after using Anesthesia- "They almost could not get me back" (HAS TO TAKE SPIRONOLACTONE NOW)   Losartan Potassium Other (See Comments)   Hypotension   Papaya Derivatives Itching, Other (See Comments)   Itching of the throat   Sulfa Antibiotics Other (See Comments)   Constipation         Medication List     STOP taking these medications    predniSONE 20 MG tablet Commonly known as: DELTASONE       TAKE these medications    (feeding supplement) PROSource Plus liquid Take 30 mLs by mouth 2 (two) times daily between meals.   acetaminophen 500 MG tablet Commonly known as: TYLENOL Take 500 mg by mouth every 6 (six) hours as needed for moderate pain or mild pain.   bisacodyl 10 MG suppository Commonly known  as: DULCOLAX Place 1 suppository (10 mg total) rectally daily as needed for moderate constipation.   BOSWELLIA PO Take 1 capsule by mouth daily as needed (for pain).   CITRUS BERGAMOT PO Take 1 capsule by mouth daily as needed (for pain).   clopidogrel 75 MG tablet Commonly known as: PLAVIX TAKE 1 TABLET BY MOUTH EVERY DAY   finasteride 5 MG tablet Commonly known as: PROSCAR Take 5 mg by mouth daily.   Gerhardt's butt cream Crea Apply 1 Application topically continuous as needed for irritation.   HYDROcodone-acetaminophen 5-325 MG tablet Commonly known as: NORCO/VICODIN Take 1 tablet by mouth every 4 (four) hours as needed for moderate pain or severe pain.   labetalol 200 MG tablet Commonly known as: NORMODYNE Take 1 tablet (200 mg total) by mouth 2 (two) times daily. What changed:  medication strength how much to take   lactobacillus acidophilus Tabs tablet Take 2 tablets by mouth 2 (two) times daily. What changed: when to take this   magnesium hydroxide 400 MG/5ML suspension Commonly known as: MILK OF MAGNESIA Take 15-30 mLs by mouth daily as needed for mild constipation.   NIFEdipine 30 MG 24 hr tablet Commonly known as: ADALAT CC Take 30 mg by mouth in the morning.   NIFEdipine 60 MG 24 hr tablet Commonly known as: ADALAT CC Take  1 tablet (60 mg total) by mouth every evening.   NON FORMULARY Take 1-2 capsules by mouth See admin instructions. Nitric Oxide Beet Root Capsules- Take 1-2 capsules by mouth once a day   NON FORMULARY Take 14 g by mouth See admin instructions. MCT Oil - Keto & Vegan MCTs C8, C10 from Coconuts- Mix 14 grams into a desired drink or food and consume by mouth once a day   NON FORMULARY Take 1 capsule by mouth See admin instructions. [place] Rhubarb - Herbal Supplement for Digestive Health - Natural Formula- Take 1 capsule by mouth once a day as needed to support digestive health   pantoprazole 40 MG tablet Commonly known as:  PROTONIX Take 1 tablet (40 mg total) by mouth daily. Take along with prednisone   polyethylene glycol 17 g packet Commonly known as: MIRALAX / GLYCOLAX Take 17 g by mouth daily. Start taking on: February 24, 2023   Qunol Ultra CoQ10 100-150 MG-UNIT Caps Generic drug: Coenzyme Q10-Vitamin E Take 1 capsule by mouth daily.   rosuvastatin 20 MG tablet Commonly known as: CRESTOR TOME UNA TABLETA TODOS LOS DIAS What changed: See the new instructions.   senna-docusate 8.6-50 MG tablet Commonly known as: Senokot-S Take 3 tablets by mouth every evening. What changed:  when to take this reasons to take this   senna-docusate 8.6-50 MG tablet Commonly known as: Senokot-S Take 1 tablet by mouth daily as needed for up to 15 days for mild constipation (To prevent constipation from percocet narcotic). What changed: You were already taking a medication with the same name, and this prescription was added. Make sure you understand how and when to take each.   spironolactone 50 MG tablet Commonly known as: ALDACTONE Take 1 tablet (50 mg total) by mouth daily. What changed:  medication strength how much to take   tamsulosin 0.4 MG Caps capsule Commonly known as: FLOMAX Take 1 capsule (0.4 mg total) by mouth daily. What changed: when to take this   tiZANidine 2 MG tablet Commonly known as: ZANAFLEX Take 1 tablet (2 mg total) by mouth daily as needed for muscle spasms.   triamcinolone ointment 0.1 % Commonly known as: KENALOG Apply 1 Application topically 2 (two) times daily as needed (for dryness).   Vitamin D3 125 MCG (5000 UT) Caps Take 5,000 Units by mouth every other day.       Disposition: SNF Diet recommendation: Regular diet  Discharge Exam: Vitals:   02/22/23 0933 02/22/23 1341 02/22/23 2109 02/23/23 0642  BP: 126/89 128/73 131/74 (!) 140/77  Pulse: 91 85 76 (!) 59  Resp: 18 14 18 18   Temp: 98.3 F (36.8 C) 97.8 F (36.6 C) (!) 97.5 F (36.4 C) (!) 97.5 F (36.4 C)   TempSrc: Oral Oral Oral Oral  SpO2: 97% 98% 98% 98%  Weight:      Height:       General: Appear in mild distress; no visible Abnormal Neck Mass Or lumps, Conjunctiva normal Cardiovascular: S1 and S2 Present, no Murmur, Respiratory: good respiratory effort, Bilateral Air entry present and CTA, no Crackles, no wheezes Abdomen: Bowel Sound present, Non tender  Extremities: no Pedal edema Neurology: alert and oriented to time, place, and person chronic left hemiparesis and contracture.   Filed Weights   02/12/23 0839  Weight: 58.5 kg   Condition at discharge: stable  The results of significant diagnostics from this hospitalization (including imaging, microbiology, ancillary and laboratory) are listed below for reference.   Imaging Studies: IR  KYPHO LUMBAR INC FX REDUCE BONE BX UNI/BIL CANNULATION INC/IMAGING  Result Date: 02/20/2023 CLINICAL DATA:  77 year old male with history of traumatic L3 vertebral body fracture. EXAM: FLUOROSCOPIC GUIDED KYPHOPLASTY OF THE L3 VERTEBRAL BODY COMPARISON:  None Available. MEDICATIONS: As antibiotic prophylaxis, Ancef 2 gm IV was ordered pre-procedure and administered intravenously within 1 hour of incision. ANESTHESIA/SEDATION: Moderate (conscious) sedation was employed during this procedure. A total of Versed 2 mg and dilaudid 1 mcg was administered intravenously. Moderate Sedation Time: 33 minutes. The patient's level of consciousness and vital signs were monitored continuously by radiology nursing throughout the procedure under my direct supervision. FLUOROSCOPY TIME:  One hundred sixty-seven mGy COMPLICATIONS: None immediate. PROCEDURE: The procedure, risks (including but not limited to bleeding, infection, organ damage), benefits, and alternatives were explained to the patient. Questions regarding the procedure were encouraged and answered. The patient understands and consents to the procedure. The patient has suffered a fracture of the L3 vertebral body.  It is recommended that patients aged 75 years or older be evaluated for possible testing or treatment of osteoporosis. A copy of this procedure report is sent to the patient's referring physician The patient was placed prone on the fluoroscopic table. The skin overlying the lumbar region was then prepped and draped in the usual sterile fashion. Maximal barrier sterile technique was utilized including caps, mask, sterile gowns, sterile gloves, sterile drape, hand hygiene and skin antiseptic. Intravenous Fentanyl and Versed were administered as conscious sedation during continuous cardiorespiratory monitoring by the radiology RN. The left pedicle at L3 was then infiltrated with 1% lidocaine followed by the advancement of a Kyphon trocar needle through the left pedicle into the posterior one-third of the vertebral body. Subsequently, the osteo drill was advanced to the anterior third of the vertebral body. The osteo drill was retracted. Through the working cannula, a Kyphon inflatable bone tamp 15 x 3 was advanced and positioned with the distal marker approximately 5 mm from the anterior aspect of the cortex. Appropriate positioning was confirmed on the AP projection. At this time, the balloon was expanded using contrast via a Kyphon inflation syringe device via micro tubing. In similar fashion, the right L3 pedicle was infiltrated with 1% lidocaine followed by the advancement of a second Kyphon trocar needle through the right pedicle into the posterior third of the vertebral body. Subsequently, the osteo drill was coaxially advanced to the anterior right third. The osteo drill was exchanged for a Kyphon inflatable bone tamp 15 x 3, advanced to the 5 mm of the anterior aspect of the cortex. The balloon was then expanded using contrast as above. Inflations were continued until there was near apposition with the superior end plate. At this time, methylmethacrylate mixture was reconstituted in the Kyphon bone mixing  device system. This was then loaded into the delivery mechanism, attached to Kyphon bone fillers. The balloons were deflated and removed followed by the instillation of methylmethacrylate mixture with excellent filling in the AP and lateral projections. There is mild extravasation of the cement along the left lateral border of the vertebral body through the fracture deformity. No extravasation was noted in the disk spaces or posteriorly into the spinal canal. No epidural venous contamination was seen. The working cannulae and the bone filler were then retrieved and removed. Hemostasis was achieved with manual compression. The patient tolerated the procedure well without immediate postprocedural complication. IMPRESSION: 1. Technically successful L3 vertebral body augmentation using balloon kyphoplasty. 2. Per CMS PQRS reporting requirements (PQRS Measure 24): Given  the patient's age of greater than 50 and the fracture site (hip, distal radius, or spine), the patient should be tested for osteoporosis using DXA, and the appropriate treatment considered based on the DXA results. Marliss Coots, MD [facility] Radiology Electronically Signed   By: Marliss Coots M.D.   On: 02/20/2023 15:10   DG Abd Portable 1V  Result Date: 02/19/2023 CLINICAL DATA:  [id number] Abdominal discomfort [id number] EXAM: PORTABLE ABDOMEN - 1 VIEW COMPARISON:  10/19/2020. FINDINGS: The bowel gas pattern is non-obstructive. There is small-to-moderate stool burden, including the ascending colon, compatible with colonic hypomotility. No evidence of pneumoperitoneum, within the limitations of a supine film. No acute osseous abnormalities. The soft tissues are within normal limits. Surgical changes, devices, tubes and lines: Partially seen left hip arthroplasty. IMPRESSION: Nonobstructive bowel gas pattern. Electronically Signed   By: Jules Schick M.D.   On: 02/19/2023 13:39   DG CHEST PORT 1  VIEW  Result Date: 02/19/2023 CLINICAL DATA:  [id number] SOB (shortness of breath) [id number] EXAM: PORTABLE CHEST 1 VIEW COMPARISON:  02/17/2023. FINDINGS: There is blunting of left lateral costophrenic angle with associated probable linear areas of atelectasis/scarring at the left lung base. Bilateral lungs otherwise clear. No acute consolidation or major lung collapse. Right lateral costophrenic angle is clear. Normal cardio-mediastinal silhouette. Aortic arch calcifications noted.  Coronary artery stent noted. No acute osseous abnormalities. The soft tissues are within normal limits. IMPRESSION: Small left pleural effusion with associated probable linear areas of atelectasis/scarring in the left lung base. Electronically Signed   By: Jules Schick M.D.   On: 02/19/2023 13:37   DG CHEST PORT 1 VIEW  Result Date: 02/17/2023 CLINICAL DATA:  Chest congestion EXAM: PORTABLE CHEST 1 VIEW COMPARISON:  X-ray 12/17/2022 and older FINDINGS: Normal cardiopericardial silhouette. No consolidation, pneumothorax or effusion. No edema. Calcified aorta. Deformity of the left shoulder, scapula with degenerative changes, chronic. IMPRESSION: No acute cardiopulmonary disease. Chronic changes of the left shoulder and scapula. Electronically Signed   By: Karen Kays M.D.   On: 02/17/2023 17:22   MR LUMBAR SPINE WO CONTRAST  Result Date: 02/13/2023 CLINICAL DATA:  Lumbar compression fracture. EXAM: MRI LUMBAR SPINE WITHOUT CONTRAST TECHNIQUE: Multiplanar, multisequence MR imaging of the lumbar spine was performed. No intravenous contrast was administered. COMPARISON:  Lumbar CT from yesterday FINDINGS: Segmentation:  Standard. Alignment:  Mild scoliosis and L3-4 anterolisthesis. Vertebrae: Cores on tool band of marrow edema in the L3 body following a hypointense fracture plane. Height loss is minimal and non progressed. No evidence of bone lesion. Conus medullaris and cauda equina: Conus extends to the L1-2 level. Conus and cauda  equina appear normal. Paraspinal and other soft tissues: Negative for perispinal mass or inflammation. Asymmetric atrophy of the left psoas. Disc levels: T12- L1: Unremarkable. L1-L2: Unremarkable. L2-L3: Foraminal predominant disc bulging with posterior annular fissure. Negative facets. L3-L4: Degenerative facet spurring with mild anterolisthesis. The disc is circumferentially bulging with biforaminal protrusion. Mild annular fissuring. A left foraminal protrusion indents the left L3 nerve root. L4-L5: Disc narrowing and bulging with left foraminal protrusion. Degenerative facet spurring on both sides. Mild to moderate spinal stenosis. The left foraminal disc protrusion protrusion indents the left L4 nerve root. L5-S1:Bulging disc with right foraminal protrusion causing moderate foraminal stenosis. Mild contribution from facet spurring on the right. IMPRESSION: 1. Acute L3 compression fracture with slight height loss. 2. Generalized lumbar spine degeneration with scoliosis and mild L3-4 anterolisthesis. 3. Moderate foraminal narrowing on the right at  L5-S1 and left at L3-4, L4-5. 4. Mild to moderate spinal stenosis at L4-5. Electronically Signed   By: Tiburcio Pea M.D.   On: 02/13/2023 18:06   CT Lumbar Spine Wo Contrast  Result Date: 02/12/2023 CLINICAL DATA:  Back trauma, no prior imaging (Age >= 16y) EXAM: CT LUMBAR SPINE WITHOUT CONTRAST TECHNIQUE: Multidetector CT imaging of the lumbar spine was performed without intravenous contrast administration. Multiplanar CT image reconstructions were also generated. RADIATION DOSE REDUCTION: This exam was performed according to the departmental dose-optimization program which includes automated exposure control, adjustment of the mA and/or kV according to patient size and/or use of iterative reconstruction technique. COMPARISON:  None Available. FINDINGS: Segmentation: 5 lumbar type vertebrae. Alignment: Normal. Vertebrae: There is a very mild compression  deformity of the anterior superior endplate of L3 (series 5, image 13). No focal pathologic process. Paraspinal and other soft tissues: Aortic atherosclerotic calcifications. Disc levels: Mild spinal canal narrowing at L3-L4 and moderate spinal canal narrowing at L4-L5. Moderate bilateral neural foraminal narrowing at L4-L5. Severe right-sided neural foraminal narrowing at L5-S1. IMPRESSION: 1. Very mild acute superior endplate compression deformity at the anterior aspect of L3, age indeterminate. 2. Moderate spinal canal narrowing at L4-L5 3. Severe right-sided neural foraminal narrowing at L5-S1. Aortic Atherosclerosis (ICD10-I70.0). Electronically Signed   By: Lorenza Cambridge M.D.   On: 02/12/2023 11:05   DG Hips Bilat W or Wo Pelvis 3-4 Views  Result Date: 02/12/2023 CLINICAL DATA:  Trauma, fall, pain EXAM: DG HIP (WITH OR WITHOUT PELVIS) 3-4V BILAT COMPARISON:  08/02/2019 FINDINGS: There is previous left hip arthroplasty. No recent fracture or dislocation is seen. Arterial calcifications are noted. There are tiny metallic densities in the soft tissues adjacent to both hips, possibly residual from previous injury. Dextroscoliosis is seen in the lumbar spine. Degenerative changes are noted in lumbar spine with bony spurs, disc space narrowing and facet hypertrophy. IMPRESSION: No recent fracture or dislocation is seen. Previous left hip arthroplasty. Arteriosclerosis.  Lumbar spondylosis. Electronically Signed   By: Ernie Avena M.D.   On: 02/12/2023 09:33    Microbiology: Results for orders placed or performed during the hospital encounter of 12/17/22  SARS Coronavirus 2 by RT PCR (hospital order, performed in [facility] hospital lab) *cepheid single result test* Anterior Nasal Swab     Status: None   Collection Time: 12/17/22  9:08 PM   Specimen: Anterior Nasal Swab  Result Value Ref Range Status   SARS Coronavirus 2 by RT PCR NEGATIVE NEGATIVE Final    Comment: (NOTE) SARS-CoV-2 target  nucleic acids are NOT DETECTED.  The SARS-CoV-2 RNA is generally detectable in upper and lower respiratory specimens during the acute phase of infection. The lowest concentration of SARS-CoV-2 viral copies this assay can detect is 250 copies / mL. A negative result does not preclude SARS-CoV-2 infection and should not be used as the sole basis for treatment or other patient management decisions.  A negative result may occur with improper specimen collection / handling, submission of specimen other than nasopharyngeal swab, presence of viral mutation(s) within the areas targeted by this assay, and inadequate number of viral copies (<250 copies / mL). A negative result must be combined with clinical observations, patient history, and epidemiological information.  Fact Sheet for Patients:   RoadLapTop.co.za  Fact Sheet for Healthcare Providers: http://kim-miller.com/  This test is not yet approved or  cleared by the [place] FDA and has been authorized for detection and/or diagnosis of SARS-CoV-2 by FDA under an  Emergency Use Authorization (EUA).  This EUA will remain in effect (meaning this test can be used) for the duration of the COVID-19 declaration under Section 564(b)(1) of the Act, 21 U.S.C. section 360bbb-3(b)(1), unless the authorization is terminated or revoked sooner.  Performed at [facility], [address] [place], [place] [address]    Labs: CBC: Recent Labs  Lab 02/17/23 [phone] 02/18/23 0333 02/19/23 0921 02/20/23 0342 02/21/23 0325  WBC 12.3* 11.5* 14.6* 16.8* 12.5*  NEUTROABS 9.4*  --  11.7* 14.8* 10.2*  HGB 16.2 15.6 16.6 17.0 17.3*  HCT 48.1 46.8 51.2 50.6 50.8  MCV 94.7 97.5 97.9 96.6 94.2  PLT 121* 106* 108* 122* 134*   Basic Metabolic Panel: Recent Labs  Lab 02/19/23 1313 02/20/23 0342 02/20/23 1558 02/21/23 0325 02/22/23 0915  NA 127* 124* 124* 124* 129*  K 4.8 4.9 5.7* 4.4  4.4  CL 95* 91* 91* 92* 97*  CO2 19* 21* 20* 20* 22  GLUCOSE 186* 175* 76 273* 206*  BUN 43* 37* 36* 49* 43*  CREATININE 0.70 0.55* 0.57* 0.90 0.64  CALCIUM 8.6* 8.8* 8.7* 8.4* 8.2*  MG 1.9 1.9  --  2.1 1.8   Liver Function Tests: Recent Labs  Lab 02/19/23 1313 02/20/23 0342 02/21/23 0325  AST 37 24 19  ALT 156* 139* 95*  ALKPHOS 67 65 86  BILITOT 0.5 0.5 0.6  PROT 5.5* 5.6* 5.4*  ALBUMIN 2.8* 2.8* 2.9*   CBG: No results for input(s): "GLUCAP" in the last 168 hours.  Discharge time spent: greater than 30 minutes.  Author: Lynden Oxford, MD  [name]

## 2023-02-23 NOTE — TOC Transition Note (Signed)
Transition of Care [facility]) - CM/SW Discharge Note  Patient Details  Name: Howard Jackson MRN: [id number] Date of Birth: 1945-08-05  Transition of Care [facility]) CM/SW Contact:  Ewing Schlein, LCSW Phone Number: 02/23/2023, 11:32 AM  Clinical Narrative: Patient is medically stable for discharge to [facility] today. CSW confirmed admission with Velna Hatchet at [facility]. Discharge summary, discharge orders, and SNF transfer report faxed to facility in hub. Medical necessity form done; PTAR scheduled. Discharge packet completed. CSW updated patient, daughter, and Lucky Rathke with [facility] regarding discharge. RN updated. TOC signing off.  Final next level of care: Skilled Nursing Facility Barriers to Discharge: Barriers Resolved  Patient Goals and CMS Choice CMS Medicare.gov Compare Post Acute Care list provided to:: Patient Choice offered to / list presented to : Patient  Discharge Placement Existing PASRR number confirmed : 02/17/23          Patient chooses bed at: [facility] Patient to be transferred to facility by: PTAR Name of family member notified: Jearold Borza (daughter) Patient and family notified of of transfer: 02/23/23  Discharge Plan and Services Additional resources added to the After Visit Summary for   In-house Referral: Clinical Social Work Post Acute Care Choice: Skilled Nursing Facility          DME Arranged: N/A DME Agency: NA  Social Determinants of Health (SDOH) Interventions SDOH Screenings   Food Insecurity: Food Insecurity Present (02/12/2023)  Housing: Low Risk  (02/12/2023)  Transportation Needs: No Transportation Needs (02/12/2023)  Utilities: Not At Risk (02/12/2023)  Social Connections: Unknown (11/25/2021)   Received from [facility]  Tobacco Use: Low Risk  (02/12/2023)   Readmission Risk Interventions    02/16/2023    3:07 PM  Readmission Risk Prevention Plan  Transportation Screening Complete  PCP or Specialist Appt  within 3-5 Days Complete  HRI or Home Care Consult Complete  Social Work Consult for Recovery Care Planning/Counseling Complete  Palliative Care Screening Not Applicable  Medication Review [employer]) Complete

## 2023-02-23 NOTE — Plan of Care (Signed)
  Problem: Education: Goal: Knowledge of General Education information will improve Description: Including pain rating scale, medication(s)/side effects and non-pharmacologic comfort measures Outcome: Progressing   Problem: Clinical Measurements: Goal: Ability to maintain clinical measurements within normal limits will improve Outcome: Progressing   Problem: Nutrition: Goal: Adequate nutrition will be maintained Outcome: Progressing   Problem: Pain Managment: Goal: General experience of comfort will improve Outcome: Progressing   Problem: Safety: Goal: Ability to remain free from injury will improve Outcome: Progressing   

## 2023-02-25 ENCOUNTER — Telehealth: Payer: Self-pay | Admitting: *Deleted

## 2023-02-25 NOTE — Progress Notes (Deleted)
Cardiology Office Note:  .   Date:  02/25/2023  ID:  Howard Jackson, DOB [birth year], MRN [id number] PCP: [facility], [facility]  [facility] Providers Cardiologist:  Thurmon Fair, MD } History of Present Illness: .   Howard Jackson is a 77 y.o. male with a past medical history of chronic diastolic heart failure, carotid stenosis, CAD, HTN related to an adrenal tumor, PAD, renal artery stenosis, asthma, temporal arteritis. Patient is followed by Dr. Royann Shivers and presents today for an annual follow up appointment.   Per chart review, patient has a history of extensive PAD and CAD. In 03/2013, he was found to have subtotal right popliteal occlusion with weak monophasic PT and occluded AT. On the left, all three calf vessels were occluded with collateral reconstitution of the PT. He was fant to have moderate left renal artery stenosis by doppler in 2014, but repeat ultrasounds in 2015 did not show evidence of renal artery stenosis. Carotid ultrasounds in 2015 showed no significant lesions. In the past, he had 5 coronary angioplasty/stent procedures to the RCA and LAD. His most recent PCI was in 2011 when he received a DES in the LAD for in-stent restenosis. Has not had PCI since 2011.   More recently, patient had carotid ultrasounds in 07/2018 that showed no evidence of stenosis in the right ICA and 1-39% stenosis in the left ICA. Cardiac monitor in 01/2020 showed normal sinus rhythm with no severe pauses or severe bradycardia. There were occasional brief episodes of nonsustained atrial tachycardia lasting up to 13 beats.   Patient was last seen by Dr Royann Shivers on 12/17/21. At that appointment, patient did have multiple complaints. However, none of his complaints were cardiovascular. He denied shortness of breath or chest pain. No dizziness  or syncope. His BP was a bit low, and his AM dose of nifedipine was held.   CAD  - Patient has not undergone PCI since 2011 -  He is very sedentary in his day to day life- does deny chest pain or DOE - On longterm clopidogrel therapy for history of CVA  - Continue crestor 20 mg daily   PAD  - Denies claudication  - On plavix, statin as above   HTN   HLD  - On crestor 20 mg daily   L hemiparesis  History of CVA  - On lifelong clopidogrep therapy  - Most recent carotid ultrasounds in 07/2018 showed no evidence of significant obstruction in either carotid artery  - Per Dr. Erin Hearing note- no plans to perform routine carotid ultrasound testing unless new symptoms develop   ROS: ***  Studies Reviewed: .        *** Risk Assessment/Calculations:   {Does this patient have ATRIAL FIBRILLATION?:[phone]} No BP recorded.  {Refresh Note OR Click here to enter BP  :1}***       Physical Exam:   VS:  There were no vitals taken for this visit.   Wt Readings from Last 3 Encounters:  02/12/23 129 lb (58.5 kg)  12/17/22 124 lb 1.9 oz (56.3 kg)  12/03/22 130 lb 1.1 oz (59 kg)    GEN: Well nourished, well developed in no acute distress NECK: No JVD; No carotid bruits CARDIAC: ***RRR, no murmurs, rubs, gallops RESPIRATORY:  Clear to auscultation without rales, wheezing or rhonchi  ABDOMEN: Soft, non-tender, non-distended EXTREMITIES:  No edema; No deformity   ASSESSMENT AND PLAN: .   ***    {Are you ordering a CV Procedure (  e.g. stress test, cath, DCCV, TEE, etc)?   Press F2        :[id number]}  Dispo: ***  Signed, Jonita Albee, PA-C

## 2023-02-25 NOTE — Telephone Encounter (Signed)
Caller: Tonye Becket NP from pt PCP  Concern: NP from PCP wanting to decrease steroids   Description: pt was seen in our office with Dr. Chestine Spore 01/06/23  in regards to consult for Temporal artery biopsy. At this time the didn't want to proceed. NP called Asking MD if she could  decrease the dose of prednisone due to healing and recent back surgery or would temporal biopsy need to be done prior to decreasing steroids.    MD notified. Dr. Chestine Spore recommend PCP follow up with neurology in regards to decreasing medication and that if patient is willing to proceed with surgery we could get him scheduled. Will follow up with pt in regards with surgery scheduling.

## 2023-03-03 ENCOUNTER — Telehealth: Payer: Self-pay

## 2023-03-03 NOTE — Telephone Encounter (Signed)
Attempted to reach patient to schedule right temporal artery biopsy, no answer. Left VM for patient to return call.

## 2023-03-09 ENCOUNTER — Ambulatory Visit: Payer: Medicare (Managed Care) | Attending: Cardiology | Admitting: Cardiology

## 2023-03-09 NOTE — Telephone Encounter (Signed)
Received a call from patient advising that he is currently in rehab until 03/16/23 for a broken vertebra in lower back. Patient verbalized that he will contact office to schedule procedure once he is home and feels stable to proceed.

## 2023-03-30 ENCOUNTER — Other Ambulatory Visit: Payer: Self-pay

## 2023-03-30 ENCOUNTER — Telehealth: Payer: Self-pay

## 2023-03-30 DIAGNOSIS — S91109A Unspecified open wound of unspecified toe(s) without damage to nail, initial encounter: Secondary | ICD-10-CM

## 2023-03-30 NOTE — Telephone Encounter (Signed)
Returned [employer] (from [facility]). She initially wanted to schedule pt's surgery and I informed her that per last note in Epic, pt was going to call us when he was ready to schedule, as he was in rehab for a lower back issue. Shanda Bumps checked with someone at [facility] and then came back on stating pt has an open wound on leg that they wanted to make an office visit for. She is going to clarify what they are wanting to proceed with and will call us back to let us know.

## 2023-03-30 NOTE — Telephone Encounter (Signed)
Shanda Bumps called back to make an appt for pt, per their providers request. Pt has a left 2nd toe wound. She was unable to provide me with any further details. Pt is in rehab still for lumbar spine issues. Pt has been scheduled to see MD tomorrow with ABIs. Shanda Bumps is aware of this appt.

## 2023-03-31 ENCOUNTER — Ambulatory Visit (INDEPENDENT_AMBULATORY_CARE_PROVIDER_SITE_OTHER): Payer: Medicare (Managed Care) | Admitting: Vascular Surgery

## 2023-03-31 ENCOUNTER — Ambulatory Visit (HOSPITAL_COMMUNITY)
Admission: RE | Admit: 2023-03-31 | Discharge: 2023-03-31 | Disposition: A | Discharge status: Home / Self Care | Payer: Medicare (Managed Care) | Source: Ambulatory Visit | Attending: Vascular Surgery | Admitting: Vascular Surgery

## 2023-03-31 ENCOUNTER — Encounter: Payer: Self-pay | Admitting: Vascular Surgery

## 2023-03-31 VITALS — BP 121/68 | HR 77 | Temp 98.2°F | Resp 16 | Ht 67.0 in | Wt 129.0 lb

## 2023-03-31 DIAGNOSIS — I70222 Atherosclerosis of native arteries of extremities with rest pain, left leg: Secondary | ICD-10-CM | POA: Insufficient documentation

## 2023-03-31 DIAGNOSIS — S91109A Unspecified open wound of unspecified toe(s) without damage to nail, initial encounter: Secondary | ICD-10-CM | POA: Diagnosis present

## 2023-03-31 LAB — VAS US ABI WITH/WO TBI
Left ABI: 0.57
Right ABI: 0.62

## 2023-03-31 NOTE — Progress Notes (Signed)
Patient name: Howard Jackson MRN: [id number] DOB: 02-12-1946 Sex: male  REASON FOR CONSULT: Left 2nd toe wound  HPI: Howard Jackson is a 77 y.o. male, with hx CAD, HTN, HLD, CVA with left sided weakness that presents for evaluation of left 2nd toe wound.  Patient states his wound occurred yesterday.  He is currently in a skilled nursing facility with goal to rehab home.  He is currently in an electric wheelchair but apparently he is able to stand and transfer and wants to rehab to get back home.  Daughter states he is in wheelchair due to previous stroke with left-sided weakness and also previous fall with hip and back fracture.  He had ABIs that were 0.62 monophasic on the right with a toe pressure of 43 and 0.57 on the left monophasic with a toe pressure of 35  Past Medical History:  Diagnosis Date   Adrenal gland disorder (HCC)    tumor present - no change-  no recent increase     Arthritis    Benign pheochromocytoma of right adrenal gland 06/23/2014   Never had histological diagnosis, but reportedly initially diagnosed in 1989   Bunion    left foot   Complication of anesthesia    "died on the table during cervical fusion" '89 - believed to be d/t "too much medication" to treat HTN and was later dx with pheochromocytoma   Coronary artery disease    cleared for surg. by Dr. Allyson Sabal - [facility]   H/O hiatal hernia    Hammer toe    Herniated lumbar intervertebral disc    History of anxiety    Hyperlipidemia    Hypertension    Neurogenic bladder    Neuromuscular disorder (HCC)    carpal tunnel problem, even after surgery release    Peripheral arterial disease (HCC)    Primary aldosteronism (HCC)    Stroke (HCC)    in [place]- 1999, L sided weakness, wears a brace on L leg & uses cane    Past Surgical History:  Procedure Laterality Date   ANTERIOR APPROACH HEMI HIP ARTHROPLASTY Left 04/15/2018   Procedure: ANTERIOR APPROACH HEMI HIP ARTHROPLASTY;  Surgeon: Samson Frederic, MD;   Location: [facility] OR;  Service: Orthopedics;  Laterality: Left;   CARPAL TUNNEL RELEASE     bilateral    CERVICAL FUSION     CORONARY ANGIOPLASTY WITH STENT PLACEMENT     pt reports having 5 stents in his heart-2011   HERNIA REPAIR     INGUINAL HERNIA REPAIR Left 11/18/2012   Procedure: HERNIA REPAIR INGUINAL ADULT;  Surgeon: Shelly Rubenstein, MD;  Location: [facility] OR;  Service: General;  Laterality: Left;   INSERTION OF MESH Left 11/18/2012   Procedure: INSERTION OF MESH;  Surgeon: Shelly Rubenstein, MD;  Location: [facility] OR;  Service: General;  Laterality: Left;   IR KYPHO LUMBAR INC FX REDUCE BONE BX UNI/BIL CANNULATION INC/IMAGING  02/20/2023    Family History  Problem Relation Age of Onset   CVA Father    Diabetes Father    Alzheimer's disease Father    Hypertension Sister    Hypertension Brother    CVA Paternal Grandmother    Hypertension Brother    Hypertension Mother     SOCIAL HISTORY: Social History   Socioeconomic History   Marital status: Legally Separated    Spouse name: Not on file   Number of children: Not on file   Years of education: Not on file  Highest education level: Not on file  Occupational History   Not on file  Tobacco Use   Smoking status: Never   Smokeless tobacco: Never  Vaping Use   Vaping status: Never Used  Substance and Sexual Activity   Alcohol use: Yes    Comment: social - one drink, on special occasion    Drug use: No   Sexual activity: Not on file  Other Topics Concern   Not on file  Social History Narrative   Not on file   Social Determinants of Health   Financial Resource Strain: Not on file  Food Insecurity: Food Insecurity Present (02/12/2023)   Hunger Vital Sign    Worried About Running Out of Food in the Last Year: Sometimes true    Ran Out of Food in the Last Year: Sometimes true  Transportation Needs: No Transportation Needs (02/12/2023)   PRAPARE - Administrator, Civil Service (Medical): No    Lack of Transportation  (Non-Medical): No  Physical Activity: Not on file  Stress: Not on file  Social Connections: Unknown (11/25/2021)   Received from [facility]   Social Network    Social Network: Not on file  Intimate Partner Violence: Not At Risk (02/12/2023)   Humiliation, Afraid, Rape, and Kick questionnaire    Fear of Current or Ex-Partner: No    Emotionally Abused: No    Physically Abused: No    Sexually Abused: No    Allergies  Allergen Reactions   Amitriptyline Hcl Hypertension   Other Shortness Of Breath and Other (See Comments)    SKIN is sensitive- Guards for the underwear MUST be removed immediately after using  Anesthesia- "They almost could not get me back" (HAS TO TAKE SPIRONOLACTONE NOW)   Losartan Potassium Other (See Comments)    Hypotension   Papaya Derivatives Itching and Other (See Comments)    Itching of the throat   Sulfa Antibiotics Other (See Comments)    Constipation     Current Outpatient Medications  Medication Sig Dispense Refill   acetaminophen (TYLENOL) 500 MG tablet Take 500 mg by mouth every 6 (six) hours as needed for moderate pain or mild pain.     bisacodyl (DULCOLAX) 10 MG suppository Place 1 suppository (10 mg total) rectally daily as needed for moderate constipation. 12 suppository 0   Boswellia Serrata (BOSWELLIA PO) Take 1 capsule by mouth daily as needed (for pain).     Cholecalciferol (VITAMIN D3) 125 MCG (5000 UT) CAPS Take 5,000 Units by mouth every other day.     CITRUS BERGAMOT PO Take 1 capsule by mouth daily as needed (for pain).     clopidogrel (PLAVIX) 75 MG tablet TAKE 1 TABLET BY MOUTH EVERY DAY (Patient taking differently: Take 75 mg by mouth daily.) 90 tablet 3   Coenzyme Q10-Vitamin E (QUNOL ULTRA COQ10) 100-150 MG-UNIT CAPS Take 1 capsule by mouth daily.     finasteride (PROSCAR) 5 MG tablet Take 5 mg by mouth daily.     HYDROcodone-acetaminophen (NORCO/VICODIN) 5-325 MG tablet Take 1 tablet by mouth every 4 (four) hours as  needed for moderate pain or severe pain. 20 tablet 0   labetalol (NORMODYNE) 200 MG tablet Take 1 tablet (200 mg total) by mouth 2 (two) times daily. 60 tablet 0   lactobacillus acidophilus (BACID) TABS tablet Take 2 tablets by mouth 2 (two) times daily. (Patient taking differently: Take 2 tablets by mouth daily.)     magnesium hydroxide (MILK OF MAGNESIA)  400 MG/5ML suspension Take 15-30 mLs by mouth daily as needed for mild constipation.     NIFEdipine (ADALAT CC) 30 MG 24 hr tablet Take 30 mg by mouth in the morning.     NIFEdipine (ADALAT CC) 60 MG 24 hr tablet Take 1 tablet (60 mg total) by mouth every evening. 90 tablet 3   NON FORMULARY Take 1-2 capsules by mouth See admin instructions. Nitric Oxide Beet Root Capsules- Take 1-2 capsules by mouth once a day     NON FORMULARY Take 14 g by mouth See admin instructions. MCT Oil - Keto & Vegan MCTs C8, C10 from Coconuts- Mix 14 grams into a desired drink or food and consume by mouth once a day     NON FORMULARY Take 1 capsule by mouth See admin instructions. [place] Rhubarb - Herbal Supplement for Digestive Health - Natural Formula- Take 1 capsule by mouth once a day as needed to support digestive health     Nutritional Supplements (,FEEDING SUPPLEMENT, PROSOURCE PLUS) liquid Take 30 mLs by mouth 2 (two) times daily between meals. 887 mL 0   Nystatin (GERHARDT'S BUTT CREAM) CREA Apply 1 Application topically continuous as needed for irritation. 30 each 0   pantoprazole (PROTONIX) 40 MG tablet Take 1 tablet (40 mg total) by mouth daily. Take along with prednisone 60 tablet 1   polyethylene glycol (MIRALAX / GLYCOLAX) 17 g packet Take 17 g by mouth daily. 14 each 0   rosuvastatin (CRESTOR) 20 MG tablet TOME UNA TABLETA TODOS LOS DIAS (Patient taking differently: Take 20 mg by mouth daily.) 90 tablet 3   senna-docusate (SENOKOT-S) 8.6-50 MG tablet Take 3 tablets by mouth every evening. (Patient taking differently: Take 3 tablets by mouth daily as needed  for mild constipation.)     spironolactone (ALDACTONE) 50 MG tablet Take 1 tablet (50 mg total) by mouth daily. 30 tablet 0   tamsulosin (FLOMAX) 0.4 MG CAPS capsule Take 1 capsule (0.4 mg total) by mouth daily. (Patient taking differently: Take 0.4 mg by mouth in the morning and at bedtime.) 30 capsule 3   tiZANidine (ZANAFLEX) 2 MG tablet Take 1 tablet (2 mg total) by mouth daily as needed for muscle spasms. 30 tablet 0   triamcinolone ointment (KENALOG) 0.1 % Apply 1 Application topically 2 (two) times daily as needed (for dryness).     No current facility-administered medications for this visit.    REVIEW OF SYSTEMS:  [X]  denotes positive finding, [ ]  denotes negative finding Cardiac  Comments:  Chest pain or chest pressure:    Shortness of breath upon exertion:    Short of breath when lying flat:    Irregular heart rhythm:        Vascular    Pain in calf, thigh, or hip brought on by ambulation:    Pain in feet at night that wakes you up from your sleep:     Blood clot in your veins:    Leg swelling:         Pulmonary    Oxygen at home:    Productive cough:     Wheezing:         Neurologic    Sudden weakness in arms or legs:     Sudden numbness in arms or legs:     Sudden onset of difficulty speaking or slurred speech:    Temporary loss of vision in one eye:     Problems with dizziness:  Gastrointestinal    Blood in stool:     Vomited blood:         Genitourinary    Burning when urinating:     Blood in urine:        Psychiatric    Major depression:         Hematologic    Bleeding problems:    Problems with blood clotting too easily:        Skin    Rashes or ulcers:        Constitutional    Fever or chills:      PHYSICAL EXAM: Vitals:   03/31/23 1533  BP: 121/68  Pulse: 77  Resp: 16  Temp: 98.2 F (36.8 C)  TempSrc: Temporal  SpO2: 93%  Weight: 129 lb (58.5 kg)  Height: 5\' 7"  (1.702 m)    GENERAL: The patient is a well-nourished male,  in no acute distress. The vital signs are documented above. CARDIAC: There is a regular rate and rhythm.  VASCULAR:  Right femoral pulse palpable Difficult to appreciate left femoral pulse No palpable pedal pulses Left 2nd toe ulcer as pictured PULMONARY: No respiratory distress. ABDOMEN: Soft and non-tender . MUSCULOSKELETAL: There are no major deformities or cyanosis. PSYCHIATRIC: The patient has a normal affect.    DATA:   ABIs today are 0.62 monophasic on the right with a toe pressure of 43 and 0.57 on the left monophasic with a toe pressure of 35  Assessment/Plan:  77 y.o. male, with hx CAD, HTN, HLD, CVA with left sided weakness that presents for evaluation of left 2nd toe wound.  Discussed that his ABIs suggest he has moderate vascular disease with an ABI 0.57 on the left and inadequate toe pressure for wound healing.  I discussed with his daughter that his left toe pressure is 35 and he needs a toe pressure of 60-80 for wound healing.  I have recommended aortogram with lower extremity arteriogram with a focus on the left leg to improve his inflow and pursue limb salvage.  I have a hard time palpating a femoral pulse on the left so he may have inflow disease in the iliac artery and he also may have femoral-popliteal and tibial disease.  Would expect angioplasty versus stenting.  May ultimately require open surgical intervention if no endovascular options.  Risk and benefits discussed.  My office will call to schedule.   Cephus Shelling, MD [facility] Office: [phone]

## 2023-04-03 ENCOUNTER — Other Ambulatory Visit: Payer: Self-pay

## 2023-04-03 DIAGNOSIS — I70222 Atherosclerosis of native arteries of extremities with rest pain, left leg: Secondary | ICD-10-CM

## 2023-04-03 DIAGNOSIS — I739 Peripheral vascular disease, unspecified: Secondary | ICD-10-CM

## 2023-04-03 DIAGNOSIS — S91109A Unspecified open wound of unspecified toe(s) without damage to nail, initial encounter: Secondary | ICD-10-CM

## 2023-04-08 ENCOUNTER — Other Ambulatory Visit: Payer: Self-pay | Admitting: Vascular Surgery

## 2023-04-08 DIAGNOSIS — I6523 Occlusion and stenosis of bilateral carotid arteries: Secondary | ICD-10-CM

## 2023-04-13 ENCOUNTER — Other Ambulatory Visit: Payer: Medicare (Managed Care)

## 2023-04-17 ENCOUNTER — Other Ambulatory Visit: Payer: Medicare (Managed Care)

## 2023-04-17 NOTE — Progress Notes (Deleted)
Cardiology Clinic Note   Patient Name: Howard Jackson Date of Encounter: 04/17/2023  Primary Care Provider:  [facility] Primary Cardiologist:  Howard Fair, MD  Patient Profile    Howard Jackson 77 year old male presents today for an evaluation of his irregular heart rate and coronary artery disease.  Past Medical History    Past Medical History:  Diagnosis Date   Adrenal gland disorder (HCC)    tumor present - no change-  no recent increase     Arthritis    Benign pheochromocytoma of right adrenal gland 06/23/2014   Never had histological diagnosis, but reportedly initially diagnosed in 1989   Bunion    left foot   Complication of anesthesia    "died on the table during cervical fusion" '89 - believed to be d/t "too much medication" to treat HTN and was later dx with pheochromocytoma   Coronary artery disease    cleared for surg. by Dr. Allyson Jackson - [facility]   H/O hiatal hernia    Hammer toe    Herniated lumbar intervertebral disc    History of anxiety    Hyperlipidemia    Hypertension    Neurogenic bladder    Neuromuscular disorder (HCC)    carpal tunnel problem, even after surgery release    Peripheral arterial disease (HCC)    Primary aldosteronism (HCC)    Stroke (HCC)    in [place]- 1999, L sided weakness, wears a brace on L leg & uses cane   Past Surgical History:  Procedure Laterality Date   ANTERIOR APPROACH HEMI HIP ARTHROPLASTY Left 04/15/2018   Procedure: ANTERIOR APPROACH HEMI HIP ARTHROPLASTY;  Surgeon: Howard Frederic, MD;  Location: [facility] OR;  Service: Orthopedics;  Laterality: Left;   CARPAL TUNNEL RELEASE     bilateral    CERVICAL FUSION     CORONARY ANGIOPLASTY WITH STENT PLACEMENT     pt reports having 5 stents in his heart-2011   HERNIA REPAIR     INGUINAL HERNIA REPAIR Left 11/18/2012   Procedure: HERNIA REPAIR INGUINAL ADULT;  Surgeon: Howard Rubenstein, MD;  Location: [facility] OR;  Service: General;  Laterality: Left;    INSERTION OF MESH Left 11/18/2012   Procedure: INSERTION OF MESH;  Surgeon: Howard Rubenstein, MD;  Location: [facility] OR;  Service: General;  Laterality: Left;   IR KYPHO LUMBAR INC FX REDUCE BONE BX UNI/BIL CANNULATION INC/IMAGING  02/20/2023    Allergies  Allergies  Allergen Reactions   Amitriptyline Hcl Hypertension   Other Shortness Of Breath and Other (See Comments)    SKIN is sensitive- Guards for the underwear MUST be removed immediately after using  Anesthesia- "They almost could not get me back" (HAS TO TAKE SPIRONOLACTONE NOW)   Losartan Potassium Other (See Comments)    Hypotension   Papaya Derivatives Itching and Other (See Comments)    Itching of the throat   Sulfa Antibiotics Other (See Comments)    Constipation     History of Present Illness    Howard Jackson has a PMH of PAD,  CAD, renal artery stenosis, carotid stenosis, asthma, dyslipidemia, and close displaced fracture of the left femoral neck. 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  His last PCI was 10/2009.  He was recently granted surgical clearance for platelet rich plasma procedure.   He was last seen by Howard Jackson on 11/09/2018.  During that time he was having labile blood pressures and his nifedipine XL  was reduced to 60 mg at bedtime and his 30 mg dose in the a.m. was discontinued.  He was also having some ankle swelling.  With the discontinuation of the nifedipine in the a.m. the hope was that this would reduce the swelling as well.  He is on lifelong clopidogrel due to his sequela of CVA.  Carotid Dopplers on 07/2018 showed no evidence of significant obstruction right/left coronary arteries.   He presented to the clinic 06/06/19 and stated he had been doing well.   He did not have his shoulder PRP injection. He stated that the injection was going to be very costly and he did not know how much benefit it would be.  He stated he continued to have some lower extremity swelling. However, he  noticed that if he elevated his lower extremities at night swelling was gone morning.  He continued to try to eat a low-sodium diet.  He was sedentary due to his left-sided hemiparesis.   Contacted nurse triage line today 12/16/2019 and stated that he had an episode of chest pain last night that resolved on its own.  He indicated that his blood pressure monitor showed an alert for irregular heart rate.  His blood pressure last night was 106/57 with a heart rate of 69.  He told the triage nurse that he drank some coffee and ate some salt to increase his blood pressure.  Her blood pressure on recheck was 130/57 with a heart rate of 49.  He stated he had mild shortness of breath and was somewhat fatigued.   He presented the clinic 12/16/19 for follow-up evaluation and stated he had one episode of chest pain the previous night that lasted for about 30 minutes.  His discomfort was relieved with rest.  It was nonradiating and not associated with activity.  He was taking his blood pressure that morning and noted an irregular heartbeat alert on his monitor.  He stated during that time he had chills but did not notice racing of his heart.  He  drank some coffee and felt better.  Also stated he had been limiting his fluids due to frequent urination.  He had an appointment with his PCP in 2 days for evaluation of  cough that had been present for several days.   I will ordered a 7-day event monitor for further evaluation, CBC, BMP, gave a blood pressure log and plan follow-up in 6 to 8 weeks for further evaluation.  He was seen in follow-up by Howard Jackson on 02/03/2020.  He denied chest pain and dyspnea.  His cardiac event monitor showed normal sinus rhythm with occasional brief episodes of paroxysmal atrial tachycardia.  No atrial fibrillation or ventricular arrhythmias were noted.  He was last seen by Howard Jackson on 12/17/2021.  During that time he had a variety of complaints.  None were related to cardiac conditions.  He  noted that he had fallen from his bed while trying to put on a pair of pants.  He hurt his right shoulder.  He had pain and numbness in his right side.  He denied head injury.  He had lost weight and was down to 122 pounds.  His blood pressure was lower.  However, it remained labile.  He denied dizziness.  He reported that his fall was due to loss of balance.  His morning nifedipine was stopped.  His labetalol 300 twice daily was continued, nifedipine 60 mg in the evening and spironolactone were continued.  He presents to the  clinic today for follow-up evaluation and states***.  Today he denies chest pain, shortness of breath, lower extremity edema, fatigue, palpitations, melena, hematuria, hemoptysis, diaphoresis, weakness, presyncope, syncope, orthopnea, and PND.  Coronary artery disease-denies recent episodes of chest pain.  Fairly sedentary.  On clopidogrel due to history of CVA.  Has had multiple cardiac catheterizations with angioplasty and stenting to his RCA and LAD.  Underwent catheterization 4/11 and was noted to have LAD in-stent restenosis which was treated with subsequent DES. Heart healthy low-sodium diet Increase physical activity as tolerated  Essential hypertension-BP today***. Maintain blood pressure log Continue spironolactone, nifedipine, labetalol 300 twice daily Heart healthy low-sodium diet  Peripheral arterial disease-denies claudication. Increase physical activity as tolerated Continue Plavix, co-Q10, rosuvastatin  Hyperlipidemia-LDL***. Continue rosuvastatin, Plavix Repeat fasting lipids and LFTs  History of CVA-reports compliance with clopidogrel.  Denies bleeding issues.  Carotid duplex 07/2018 with no evidence of significant obstruction bilaterally Follows with neurology/PCP  Disposition: Follow-up with Howard Jackson or me in 9-12 months.  Home Medications    Prior to Admission medications   Medication Sig Start Date End Date Taking? Authorizing Provider   acetaminophen (TYLENOL) 500 MG tablet Take 500 mg by mouth every 6 (six) hours as needed for moderate pain or mild pain.    [provider]  Albuterol Sulfate (PROAIR RESPICLICK) 108 (90 Base) MCG/ACT AEPB Inhale 2 puffs into the lungs every 6 (six) hours as needed (Wheezing/SOB).    [provider]  bisacodyl (DULCOLAX) 10 MG suppository Place 1 suppository (10 mg total) rectally daily as needed for moderate constipation. 02/23/23   Rolly Salter, MD  Cholecalciferol (VITAMIN D3) 50 MCG (2000 UT) TABS Take 2,000 Units by mouth every other day.    [provider]  clopidogrel (PLAVIX) 75 MG tablet TAKE 1 TABLET BY MOUTH EVERY DAY Patient taking differently: Take 75 mg by mouth daily. 10/28/21   Croitoru, Mihai, MD  Coenzyme Q10 (CO Q 10) 100 MG CAPS Take 100 mg by mouth daily.    [provider]  Dextromethorphan-guaiFENesin (DELSYM CGH/CHEST CONG DM CHILD) 5-100 MG/5ML LIQD Take 10 mLs by mouth every 4 (four) hours as needed (Cough).    [provider]  diclofenac Sodium (VOLTAREN) 1 % GEL Apply 4 g topically 4 (four) times daily as needed (Pain).    [provider]  finasteride (PROSCAR) 5 MG tablet Take 5 mg by mouth daily. 01/19/18   [provider]  Fluticasone Furoate (ARNUITY ELLIPTA) 200 MCG/ACT AEPB Inhale 1 puff into the lungs daily. Rinse mouth after use    [provider]  gabapentin (NEURONTIN) 100 MG capsule Take 100 mg by mouth daily as needed (pain).    [provider]  HYDROcodone-acetaminophen (NORCO/VICODIN) 5-325 MG tablet Take 1 tablet by mouth every 4 (four) hours as needed for moderate pain or severe pain. Patient not taking: Reported on 04/14/2023 02/23/23   Rolly Salter, MD  labetalol (NORMODYNE) 200 MG tablet Take 1 tablet (200 mg total) by mouth 2 (two) times daily. Patient taking differently: Take 300 mg by mouth 2 (two) times daily. 02/23/23   Rolly Salter, MD  lactobacillus acidophilus  (BACID) TABS tablet Take 2 tablets by mouth 2 (two) times daily. 04/20/18   Briant Cedar, MD  Lido-PE-Glycerin-Petrolatum (PREPARATION H RAPID RELIEF) 5-0.25-14.4-15 % CREA Place 1 application  rectally 4 (four) times daily as needed (Hemorrhoid irritation).    [provider]  magnesium hydroxide (MILK OF MAGNESIA) 400 MG/5ML suspension Take  30 mLs by mouth daily as needed for mild constipation.    [provider]  Multiple Vitamins-Minerals (MULTIVITAMIN WITH MINERALS) tablet Take 1 tablet by mouth daily. Centrum silver ultra men's 300-60-600-300 mcg    [provider]  NIFEdipine (ADALAT CC) 30 MG 24 hr tablet Take 30 mg by mouth in the morning.    [provider]  NIFEdipine (ADALAT CC) 60 MG 24 hr tablet Take 1 tablet (60 mg total) by mouth every evening. 04/29/22   Croitoru, Mihai, MD  Nutritional Supplements (,FEEDING SUPPLEMENT, PROSOURCE PLUS) liquid Take 30 mLs by mouth 2 (two) times daily between meals. 02/23/23   Rolly Salter, MD  Nystatin (GERHARDT'S BUTT CREAM) CREA Apply 1 Application topically continuous as needed for irritation. Patient not taking: Reported on 04/14/2023 02/23/23   Rolly Salter, MD  pantoprazole (PROTONIX) 40 MG tablet Take 1 tablet (40 mg total) by mouth daily. Take along with prednisone 12/19/22   Meredeth Ide, MD  polyethylene glycol (MIRALAX / GLYCOLAX) 17 g packet Take 17 g by mouth daily. 02/24/23   Rolly Salter, MD  predniSONE (DELTASONE) 20 MG tablet Take 60 mg by mouth daily with breakfast.    [provider]  protein supplement (PROSOURCE NO CARB) LIQD Take 30 mLs by mouth 2 (two) times daily.    [provider]  rosuvastatin (CRESTOR) 20 MG tablet TOME UNA TABLETA TODOS LOS DIAS Patient taking differently: Take 20 mg by mouth daily. 01/28/21   Croitoru, Mihai, MD  senna-docusate (SENOKOT-S) 8.6-50 MG tablet Take 3 tablets by mouth every evening. Patient taking differently: Take 2 tablets by  mouth at bedtime. 04/20/18   Briant Cedar, MD  spironolactone (ALDACTONE) 50 MG tablet Take 1 tablet (50 mg total) by mouth daily. 02/23/23   Rolly Salter, MD  tamsulosin (FLOMAX) 0.4 MG CAPS capsule Take 1 capsule (0.4 mg total) by mouth daily. Patient taking differently: Take 0.4 mg by mouth in the morning and at bedtime. 01/09/17   Massie Maroon, FNP  tiZANidine (ZANAFLEX) 2 MG tablet Take 1 tablet (2 mg total) by mouth daily as needed for muscle spasms. 02/23/23   Rolly Salter, MD  traMADol HCl 25 MG TABS Take 1-2 tablets by mouth daily as needed (up to 3 days as needed for pain).    [provider]  triamcinolone ointment (KENALOG) 0.1 % Apply 1 Application topically 2 (two) times daily as needed (for dryness).    [provider]  Zinc Oxide 40 % PSTE Apply 1 Application topically as needed for irritation (Skin protection).    [provider]    Family History    Family History  Problem Relation Age of Onset   CVA Father    Diabetes Father    Alzheimer's disease Father    Hypertension Sister    Hypertension Brother    CVA Paternal Grandmother    Hypertension Brother    Hypertension Mother    He indicated that the status of his mother is unknown. He indicated that his father is deceased. He indicated that his sister is alive. He indicated that both of his brothers are alive. He indicated that his maternal grandmother is deceased. He indicated that his maternal grandfather is deceased. He indicated that his paternal grandmother is deceased. He indicated that his paternal grandfather is deceased.  Social History    Social History   Socioeconomic History   Marital status: Legally Separated    Spouse name: Not  on file   Number of children: Not on file   Years of education: Not on file   Highest education level: Not on file  Occupational History   Not on file  Tobacco Use   Smoking status: Never   Smokeless tobacco: Never  Vaping Use    Vaping status: Never Used  Substance and Sexual Activity   Alcohol use: Yes    Comment: social - one drink, on special occasion    Drug use: No   Sexual activity: Not on file  Other Topics Concern   Not on file  Social History Narrative   Not on file   Social Determinants of Health   Financial Resource Strain: Not on file  Food Insecurity: Food Insecurity Present (02/12/2023)   Hunger Vital Sign    Worried About Running Out of Food in the Last Year: Sometimes true    Ran Out of Food in the Last Year: Sometimes true  Transportation Needs: No Transportation Needs (02/12/2023)   PRAPARE - Administrator, Civil Service (Medical): No    Lack of Transportation (Non-Medical): No  Physical Activity: Not on file  Stress: Not on file  Social Connections: Unknown (11/25/2021)   Received from [facility]   Social Network    Social Network: Not on file  Intimate Partner Violence: Not At Risk (02/12/2023)   Humiliation, Afraid, Rape, and Kick questionnaire    Fear of Current or Ex-Partner: No    Emotionally Abused: No    Physically Abused: No    Sexually Abused: No     Review of Systems    General:  No chills, fever, night sweats or weight changes.  Cardiovascular:  No chest pain, dyspnea on exertion, edema, orthopnea, palpitations, paroxysmal nocturnal dyspnea. Dermatological: No rash, lesions/masses Respiratory: No cough, dyspnea Urologic: No hematuria, dysuria Abdominal:   No nausea, vomiting, diarrhea, bright red blood per rectum, melena, or hematemesis Neurologic:  No visual changes, wkns, changes in mental status. All other systems reviewed and are otherwise negative except as noted above.  Physical Exam    VS:  There were no vitals taken for this visit. , BMI There is no height or weight on file to calculate BMI. GEN: Well nourished, well developed, in no acute distress. HEENT: normal. Neck: Supple, no JVD, carotid bruits, or masses. Cardiac: RRR, no  murmurs, rubs, or gallops. No clubbing, cyanosis, edema.  Radials/DP/PT 2+ and equal bilaterally.  Respiratory:  Respirations regular and unlabored, clear to auscultation bilaterally. GI: Soft, nontender, nondistended, BS + x 4. MS: no deformity or atrophy. Skin: warm and dry, no rash. Neuro:  Strength and sensation are intact. Psych: Normal affect.  Accessory Clinical Findings    Recent Labs: 12/17/2022: TSH 2.266 02/21/2023: ALT 95; Hemoglobin 17.3; Platelets 134 02/22/2023: BUN 43; Creatinine, Ser 0.64; Magnesium 1.8; Potassium 4.4; Sodium 129   Recent Lipid Panel    Component Value Date/Time   CHOL 156 02/03/2020 1450   TRIG 52 02/03/2020 1450   HDL 49 02/03/2020 1450   CHOLHDL 3.2 02/03/2020 1450   CHOLHDL 3.1 05/22/2016 1435   VLDL 15 05/22/2016 1435   LDLCALC 96 02/03/2020 1450    No BP recorded.  {Refresh Note OR Click here to enter BP  :1}***    ECG personally reviewed by me today- ***    Cardiac event monitor 01/19/2020  The dominant rhythm is normal sinus, with normal circadian variation. There are no severe pauses or severe bradycardia There are occasional  brief episodes of nonsustained atrial tachycardia, up to a maximum of 13 beats (5.6 s) There is no significant ventricular arrhythmia.   Mildly abnormal long-term arrhythmia monitor due to occasional brief nonsustained atrial tachycardia. No atrial fibrillation is seen.  Nuclear stress test 10/14/2012   Stress Protocol Rest HR: 57 Stress HR: 75  Rest BP: 122/70 Stress BP: 110/67  Exercise Time (min): n/a METS: n/a    Predicted Max HR: 152 bpm % Max HR: 49.34 bpm Rate Pressure Product: 9150   Dose of Adenosine (mg):  n/a Dose of Lexiscan: 0.4 mg  Dose of Atropine (mg): n/a Dose of Dobutamine: n/a mcg/kg/min (at max HR)  Stress Test Technologist: Esperanza Sheets, CCT Nuclear Technologist: Koren Shiver, CNMT    Rest Procedure:  Myocardial perfusion imaging was performed at rest 45 minutes following the  intravenous administration of Technetium 60m Sestamibi. Stress Procedure:  The patient received IV Lexiscan 0.4 mg over 15-seconds.  Technetium 51m Sestamibi injected at 30-seconds.  The Patient experienced SOB and was administered 75 mg of IV Aminophylline with quick resolution.  There were no significant changes with Lexiscan.  Quantitative spect images were obtained after a 45 minute delay.   Transient Ischemic Dilatation (Normal <1.22):  0.87   Lung/Heart Ratio (Normal <0.45):  0.41 QGS EDV:  52 ml QGS ESV:  12 ml LV Ejection Fraction: 77%   Signed by            Rest ECG: NSR - Normal EKG   Stress ECG: No significant change from baseline ECG   QPS Raw Data Images:  Normal; no motion artifact; normal heart/lung ratio. Stress Images:  Normal homogeneous uptake in all areas of the myocardium. Rest Images:  Normal homogeneous uptake in all areas of the myocardium. Subtraction (SDS):  No evidence of ischemia.   Impression Exercise Capacity:  Lexiscan with no exercise. BP Response:  Normal blood pressure response. Clinical Symptoms:  No significant symptoms noted. ECG Impression:  No significant ST segment change suggestive of ischemia. Comparison with Prior Nuclear Study: No significant change from previous study   Overall Impression:  Normal stress nuclear study.   LV Wall Motion:  NL LV Function; NL Wall Motion     Runell Gess, MD   10/14/2012 1:07 PM    Assessment & Plan   1.  ***   Thomasene Ripple. Marjory Meints NP-C     04/17/2023, 7:31 AM [facility] [address] Office [phone] Fax [phone]    I spent***minutes examining this patient, reviewing medications, and using patient centered shared decision making involving her cardiac care.  Prior to her visit I spent greater than 20 minutes reviewing her past medical history,  medications, and prior cardiac tests.

## 2023-04-21 ENCOUNTER — Emergency Department (HOSPITAL_COMMUNITY)
Admission: EM | Admit: 2023-04-21 | Discharge: 2023-04-21 | Disposition: A | Discharge status: Home / Self Care | Payer: Medicare (Managed Care) | Attending: Emergency Medicine | Admitting: Emergency Medicine

## 2023-04-21 ENCOUNTER — Ambulatory Visit: Payer: Medicare (Managed Care) | Attending: General Practice | Admitting: General Practice

## 2023-04-21 ENCOUNTER — Other Ambulatory Visit: Payer: Self-pay

## 2023-04-21 ENCOUNTER — Emergency Department (HOSPITAL_COMMUNITY): Payer: Medicare (Managed Care)

## 2023-04-21 DIAGNOSIS — K5900 Constipation, unspecified: Secondary | ICD-10-CM | POA: Diagnosis not present

## 2023-04-21 DIAGNOSIS — R1012 Left upper quadrant pain: Secondary | ICD-10-CM | POA: Insufficient documentation

## 2023-04-21 LAB — CBC
HCT: 41.4 % (ref 39.0–52.0)
Hemoglobin: 13.9 g/dL (ref 13.0–17.0)
MCH: 32.8 pg (ref 26.0–34.0)
MCHC: 33.6 g/dL (ref 30.0–36.0)
MCV: 97.6 fL (ref 80.0–100.0)
Platelets: 135 10*3/uL — ABNORMAL LOW (ref 150–400)
RBC: 4.24 MIL/uL (ref 4.22–5.81)
RDW: 15.1 % (ref 11.5–15.5)
WBC: 11.4 10*3/uL — ABNORMAL HIGH (ref 4.0–10.5)
nRBC: 0.2 % (ref 0.0–0.2)

## 2023-04-21 LAB — BASIC METABOLIC PANEL
Anion gap: 14 (ref 5–15)
BUN: 31 mg/dL — ABNORMAL HIGH (ref 8–23)
CO2: 25 mmol/L (ref 22–32)
Calcium: 9.2 mg/dL (ref 8.9–10.3)
Chloride: 91 mmol/L — ABNORMAL LOW (ref 98–111)
Creatinine, Ser: 0.87 mg/dL (ref 0.61–1.24)
GFR, Estimated: >60 mL/min (ref >60)
Glucose, Bld: 152 mg/dL — ABNORMAL HIGH (ref 70–99)
Potassium: 4.2 mmol/L (ref 3.5–5.1)
Sodium: 130 mmol/L — ABNORMAL LOW (ref 135–145)

## 2023-04-21 LAB — URINALYSIS, ROUTINE W REFLEX MICROSCOPIC
Bilirubin Urine: NEGATIVE
Glucose, UA: NEGATIVE mg/dL
Hgb urine dipstick: NEGATIVE
Ketones, ur: NEGATIVE mg/dL
Leukocytes,Ua: NEGATIVE
Nitrite: NEGATIVE
Protein, ur: NEGATIVE mg/dL
Specific Gravity, Urine: 1.025 (ref 1.005–1.030)
pH: 6 (ref 5.0–8.0)

## 2023-04-21 LAB — HEPATIC FUNCTION PANEL
ALT: 42 U/L (ref 0–44)
AST: 20 U/L (ref 15–41)
Albumin: 3.1 g/dL — ABNORMAL LOW (ref 3.5–5.0)
Alkaline Phosphatase: 60 U/L (ref 38–126)
Bilirubin, Direct: 0.1 mg/dL (ref 0.0–0.2)
Indirect Bilirubin: 0.5 mg/dL (ref 0.3–0.9)
Total Bilirubin: 0.6 mg/dL (ref 0.3–1.2)
Total Protein: 5.8 g/dL — ABNORMAL LOW (ref 6.5–8.1)

## 2023-04-21 LAB — LIPASE, BLOOD: Lipase: 42 U/L (ref 11–51)

## 2023-04-21 LAB — TROPONIN I (HIGH SENSITIVITY)
Troponin I (High Sensitivity): 16 ng/L (ref <18)
Troponin I (High Sensitivity): 19 ng/L — ABNORMAL HIGH (ref <18)

## 2023-04-21 MED ORDER — TRAMADOL HCL 50 MG PO TABS
50.0000 mg | ORAL_TABLET | Freq: Once | ORAL | Status: AC
  Administered 2023-04-21: 50 mg via ORAL
  Filled 2023-04-21: qty 1

## 2023-04-21 MED ORDER — IOHEXOL 350 MG/ML SOLN
75.0000 mL | Freq: Once | INTRAVENOUS | Status: AC | PRN
  Administered 2023-04-21: 75 mL via INTRAVENOUS

## 2023-04-21 NOTE — ED Provider Notes (Signed)
[place] [facility] AT [facility] Provider Note   CSN: [id number] Arrival date & time: 04/21/23  0315     History  Chief Complaint  Patient presents with   Abdominal Pain    Howard Jackson is a 77 y.o. male.  77 y.o. male presenting for acute onset left upper quadrant abdominal pain that began after a disagreement with his home health aide earlier this morning.  Initially pain was sharp and concentrated under his left rib cage.  It did not radiate and he denies shortness of breath or chest pain.  Since staying in the emergency room pain has resolved.  He denies nausea and vomiting, fever or sick contacts.  On reassessment patient endorses falling out of his wheelchair 2 days ago and continues to have pain in his left mid back.   Past medical history significant for stroke with residual left-sided deficits.  He is wheelchair-bound at baseline.  He reports having a chronic history of constipation where he has a bowel movement every 3 to 4 days.  He has chronic fecal incontinence.  The history is provided by the patient. No language interpreter was used.  Abdominal Pain Associated symptoms: constipation   Associated symptoms: no chest pain, no diarrhea, no dysuria, no fatigue, no fever, no nausea, no shortness of breath and no vomiting        Home Medications Prior to Admission medications   Medication Sig Start Date End Date Taking? Authorizing Provider  acetaminophen (TYLENOL) 500 MG tablet Take 500 mg by mouth every 6 (six) hours as needed for moderate pain or mild pain.    [provider]  Albuterol Sulfate (PROAIR RESPICLICK) 108 (90 Base) MCG/ACT AEPB Inhale 2 puffs into the lungs every 6 (six) hours as needed (Wheezing/SOB).    [provider]  bisacodyl (DULCOLAX) 10 MG suppository Place 1 suppository (10 mg total) rectally daily as needed for moderate constipation. 02/23/23   Rolly Salter, MD  Cholecalciferol (VITAMIN D3) 50  MCG (2000 UT) TABS Take 2,000 Units by mouth every other day.    [provider]  clopidogrel (PLAVIX) 75 MG tablet TAKE 1 TABLET BY MOUTH EVERY DAY Patient taking differently: Take 75 mg by mouth daily. 10/28/21   Croitoru, Mihai, MD  Coenzyme Q10 (CO Q 10) 100 MG CAPS Take 100 mg by mouth daily.    [provider]  Dextromethorphan-guaiFENesin (DELSYM CGH/CHEST CONG DM CHILD) 5-100 MG/5ML LIQD Take 10 mLs by mouth every 4 (four) hours as needed (Cough).    [provider]  diclofenac Sodium (VOLTAREN) 1 % GEL Apply 4 g topically 4 (four) times daily as needed (Pain).    [provider]  finasteride (PROSCAR) 5 MG tablet Take 5 mg by mouth daily. 01/19/18   [provider]  Fluticasone Furoate (ARNUITY ELLIPTA) 200 MCG/ACT AEPB Inhale 1 puff into the lungs daily. Rinse mouth after use    [provider]  gabapentin (NEURONTIN) 100 MG capsule Take 100 mg by mouth daily as needed (pain).    [provider]  HYDROcodone-acetaminophen (NORCO/VICODIN) 5-325 MG tablet Take 1 tablet by mouth every 4 (four) hours as needed for moderate pain or severe pain. Patient not taking: Reported on 04/14/2023 02/23/23   Rolly Salter, MD  labetalol (NORMODYNE) 200 MG tablet Take 1 tablet (200 mg total) by mouth 2 (two) times daily. Patient taking differently: Take 300 mg by mouth 2 (two) times daily. 02/23/23   Rolly Salter, MD  lactobacillus acidophilus (BACID) TABS tablet Take 2 tablets by mouth 2 (two) times daily. 04/20/18   Briant Cedar, MD  Lido-PE-Glycerin-Petrolatum (PREPARATION H RAPID RELIEF) 5-0.25-14.4-15 % CREA Place 1 application  rectally 4 (four) times daily as needed (Hemorrhoid irritation).    [provider]  magnesium hydroxide (MILK OF MAGNESIA) 400 MG/5ML suspension Take 30 mLs by mouth daily as needed for mild constipation.    [provider]  Multiple Vitamins-Minerals (MULTIVITAMIN WITH MINERALS) tablet Take  1 tablet by mouth daily. Centrum silver ultra men's 300-60-600-300 mcg    [provider]  NIFEdipine (ADALAT CC) 30 MG 24 hr tablet Take 30 mg by mouth in the morning.    [provider]  NIFEdipine (ADALAT CC) 60 MG 24 hr tablet Take 1 tablet (60 mg total) by mouth every evening. 04/29/22   Croitoru, Mihai, MD  Nutritional Supplements (,FEEDING SUPPLEMENT, PROSOURCE PLUS) liquid Take 30 mLs by mouth 2 (two) times daily between meals. 02/23/23   Rolly Salter, MD  Nystatin (GERHARDT'S BUTT CREAM) CREA Apply 1 Application topically continuous as needed for irritation. Patient not taking: Reported on 04/14/2023 02/23/23   Rolly Salter, MD  pantoprazole (PROTONIX) 40 MG tablet Take 1 tablet (40 mg total) by mouth daily. Take along with prednisone 12/19/22   Meredeth Ide, MD  polyethylene glycol (MIRALAX / GLYCOLAX) 17 g packet Take 17 g by mouth daily. 02/24/23   Rolly Salter, MD  predniSONE (DELTASONE) 20 MG tablet Take 60 mg by mouth daily with breakfast.    [provider]  protein supplement (PROSOURCE NO CARB) LIQD Take 30 mLs by mouth 2 (two) times daily.    [provider]  rosuvastatin (CRESTOR) 20 MG tablet TOME UNA TABLETA TODOS LOS DIAS Patient taking differently: Take 20 mg by mouth daily. 01/28/21   Croitoru, Mihai, MD  senna-docusate (SENOKOT-S) 8.6-50 MG tablet Take 3 tablets by mouth every evening. Patient taking differently: Take 2 tablets by mouth at bedtime. 04/20/18   Briant Cedar, MD  spironolactone (ALDACTONE) 50 MG tablet Take 1 tablet (50 mg total) by mouth daily. 02/23/23   Rolly Salter, MD  tamsulosin (FLOMAX) 0.4 MG CAPS capsule Take 1 capsule (0.4 mg total) by mouth daily. Patient taking differently: Take 0.4 mg by mouth in the morning and at bedtime. 01/09/17   Massie Maroon, FNP  tiZANidine (ZANAFLEX) 2 MG tablet Take 1 tablet (2 mg total) by mouth daily as needed for muscle spasms. 02/23/23   Rolly Salter, MD  traMADol  HCl 25 MG TABS Take 1-2 tablets by mouth daily as needed (up to 3 days as needed for pain).    [provider]  triamcinolone ointment (KENALOG) 0.1 % Apply 1 Application topically 2 (two) times daily as needed (for dryness).    [provider]  Zinc Oxide 40 % PSTE Apply 1 Application topically as needed for irritation (Skin protection).    [provider]      Allergies    Amitriptyline hcl, Other, Losartan potassium, Papaya derivatives, and Sulfa antibiotics    Review of Systems   Review of Systems  Constitutional:  Negative for activity change, appetite change, fatigue and fever.  Respiratory:  Negative for apnea, chest tightness and shortness of breath.   Cardiovascular:  Negative for chest pain, palpitations and leg swelling.  Gastrointestinal:  Positive for abdominal pain and constipation. Negative for blood in stool, diarrhea, nausea and vomiting.  Genitourinary:  Negative for  difficulty urinating, dysuria and flank pain.  Musculoskeletal:  Positive for back pain.  Skin:  Negative for color change.  Neurological:  Negative for seizures, syncope, weakness and headaches.    Physical Exam Updated Vital Signs BP 124/75 (BP Location: Left Arm)   Pulse 75   Temp 97.8 F (36.6 C) (Oral)   Resp 17   SpO2 97%  Physical Exam Constitutional:      General: He is not in acute distress.    Appearance: He is normal weight. He is not ill-appearing.  HENT:     Mouth/Throat:     Mouth: Mucous membranes are moist.  Eyes:     Extraocular Movements: Extraocular movements intact.  Cardiovascular:     Rate and Rhythm: Normal rate and regular rhythm.  Pulmonary:     Effort: Pulmonary effort is normal.     Breath sounds: Normal breath sounds.  Abdominal:     General: Abdomen is flat. Bowel sounds are normal. There is no distension.     Palpations: Abdomen is soft. There is no shifting dullness.     Tenderness: There is no abdominal tenderness.  Skin:     General: Skin is warm.     Capillary Refill: Capillary refill takes less than 2 seconds.  Neurological:     Mental Status: He is alert.     Comments: Chronically weak on the left side with joint contractures of left hand  Psychiatric:        Mood and Affect: Mood normal.        Behavior: Behavior normal.     ED Results / Procedures / Treatments   Labs (all labs ordered are listed, but only abnormal results are displayed) Labs Reviewed  CBC - Abnormal; Notable for the following components:      Result Value   WBC 11.4 (*)    Platelets 135 (*)    All other components within normal limits  BASIC METABOLIC PANEL - Abnormal; Notable for the following components:   Sodium 130 (*)    Chloride 91 (*)    Glucose, Bld 152 (*)    BUN 31 (*)    All other components within normal limits  HEPATIC FUNCTION PANEL - Abnormal; Notable for the following components:   Total Protein 5.8 (*)    Albumin 3.1 (*)    All other components within normal limits  TROPONIN I (HIGH SENSITIVITY) - Abnormal; Notable for the following components:   Troponin I (High Sensitivity) 19 (*)    All other components within normal limits  LIPASE, BLOOD  URINALYSIS, ROUTINE W REFLEX MICROSCOPIC  TROPONIN I (HIGH SENSITIVITY)    EKG EKG Interpretation Date/Time:  Tuesday April 21 2023 03:21:50 EDT Ventricular Rate:  73 PR Interval:  160 QRS Duration:  86 QT Interval:  370 QTC Calculation: 407 R Axis:   -42  Text Interpretation: Normal sinus rhythm Left axis deviation Minimal voltage criteria for LVH, may be normal variant ( R in aVL ) Anterior infarct , age undetermined Abnormal ECG When compared with ECG of 18-Dec-2022 05:06, PREVIOUS ECG IS PRESENT No significant change since last tracing Confirmed by Jacalyn Lefevre [phone]) on 04/21/2023 10:18:19 AM  Radiology CT ABDOMEN PELVIS W CONTRAST  Result Date: 04/21/2023 CLINICAL DATA:  Abdominal pain, acute, nonlocalized EXAM: CT ABDOMEN AND PELVIS WITH CONTRAST  TECHNIQUE: Multidetector CT imaging of the abdomen and pelvis was performed using the standard protocol following bolus administration of intravenous contrast. RADIATION DOSE REDUCTION: This exam was performed according to  the departmental dose-optimization program which includes automated exposure control, adjustment of the mA and/or kV according to patient size and/or use of iterative reconstruction technique. CONTRAST:  75mL OMNIPAQUE IOHEXOL 350 MG/ML SOLN COMPARISON:  CT scan abdomen and pelvis from 10/09/2021. FINDINGS: Lower chest: There are patchy atelectatic changes in the visualized lung bases. No overt consolidation. No pleural effusion. The heart is normal in size. No pericardial effusion. Hepatobiliary: The liver is normal in size. Non-cirrhotic configuration. No suspicious mass. These is mild diffuse hepatic steatosis. No intrahepatic or extrahepatic bile duct dilation. No calcified gallstones. Normal gallbladder wall thickness. No pericholecystic inflammatory changes. Pancreas: There is a well-circumscribed 6 x 7 mm hypoattenuating focus in the pancreatic tail, anteriorly, which is incompletely characterized on the current examination but favored to represent a pancreatic side branch IPMN. No pancreatic ductal dilatation or surrounding inflammatory changes. Spleen: Within normal limits. No focal lesion. Adrenals/Urinary Tract: Adrenal glands are unremarkable. No suspicious renal mass. There are multiple subcentimeter hypoattenuating foci in bilateral kidneys, which are too small to adequately characterize. No hydronephrosis. No renal or ureteric calculi. Unremarkable urinary bladder. Stomach/Bowel: There is a small diverticulum arising from the second part of duodenum. No disproportionate dilation of the small or large bowel loops. No evidence of abnormal bowel wall thickening or inflammatory changes. The appendix was not visualized; however there is no acute inflammatory process in the right lower  quadrant. There are scattered diverticula mainly in the left hemi colon, without imaging signs of diverticulitis. Vascular/Lymphatic: No ascites or pneumoperitoneum. No abdominal or pelvic lymphadenopathy, by size criteria. No aneurysmal dilation of the major abdominal arteries. There are marked peripheral atherosclerotic vascular calcifications of the aorta and its major branches. Reproductive: Normal size prostate. Symmetric seminal vesicles. Other: There are fat containing umbilical and bilateral inguinal hernias. Musculoskeletal: No suspicious osseous lesions. There are mild multilevel degenerative changes in the visualized spine. There is mild reduction in the L3 vertebral body height. L3 vertebral body kyphoplasty noted. No significant retropulsion or spinal canal compromise. IMPRESSION: 1. No acute inflammatory process identified within the abdomen or pelvis. 2. There is a 6 x 7 mm hypoattenuating focus in the pancreatic tail, anteriorly, which is incompletely characterized on the current examination but favored to represent a pancreatic side branch IPMN. This could be confirmed with nonemergent, outpatient MRI examination. 3. Multiple other nonacute observations, as described above. Aortic Atherosclerosis (ICD10-I70.0). Electronically Signed   By: Jules Schick M.D.   On: 04/21/2023 13:59   DG Chest 2 View  Result Date: 04/21/2023 CLINICAL DATA:  Chest pain. EXAM: CHEST - 2 VIEW COMPARISON:  02/19/2023 FINDINGS: Stable cardiomediastinal contours. Aortic atherosclerosis. No signs of pleural effusion or edema. Unchanged opacity overlying the left costophrenic angle which may reflect an area of atelectasis versus scar. Advanced degenerative changes noted within the left glenohumeral joint. IMPRESSION: 1. Unchanged opacity overlying the left costophrenic angle which may reflect an area of atelectasis versus scar. 2. Aortic Atherosclerosis (ICD10-I70.0). Electronically Signed   By: Signa Kell M.D.   On:  04/21/2023 04:43    Procedures Procedures    Medications Ordered in ED Medications  traMADol (ULTRAM) tablet 50 mg (50 mg Oral Given 04/21/23 1125)  iohexol (OMNIPAQUE) 350 MG/ML injection 75 mL (75 mLs Intravenous Contrast Given 04/21/23 1156)    ED Course/ Medical Decision Making/ A&P  Medical Decision Making 77 y.o. male presenting with acute onset left upper quadrant abdominal pain.  On admission vital signs significant for hypertension to 141/67.  Patient is afebrile.  On exam stomach is soft and nontender to palpation.  Due to patient having recent fall will obtain CT abdomen pelvis with contrast.  Initial lab work including troponin, CMP, lipase, CBC within normal limits.  Differential for this presentation includes MSK related pain secondary to fall, less likely to be pancreatitis with normal lipase and no pain on exam, splenic laceration secondary to fall, constipation.  On reassessment, pain has improved with 50 mg tramadol.  CT scan resulting negative for acute abnormality but did show hypoattenuation on the tail the pancreas.  Discussed findings with patient and recommended following up with PCP for outpatient MRI and further investigation.  Patient is in agreement with plan and is comfortable with returning home with outpatient management.  Amount and/or Complexity of Data Reviewed Labs: ordered. Radiology: ordered.  Risk Prescription drug management.          Final Clinical Impression(s) / ED Diagnoses Final diagnoses:  Left upper quadrant abdominal pain    Rx / DC Orders ED Discharge Orders     None         Glendale Chard, DO 04/21/23 1410    Jacalyn Lefevre, MD 04/21/23 1512

## 2023-04-21 NOTE — ED Notes (Signed)
Pt transported to xray 

## 2023-04-21 NOTE — ED Notes (Signed)
RN spoke to pt's daughter on phone. Pt's daughter stated that she or someone else will be picking up pt shortly. RN helped pt transfer to wheelchair and wheeled pt to lobby per charge, rn.

## 2023-04-21 NOTE — ED Notes (Signed)
Spoke to [employer] about pt's ride situation. Charge RN stated pt can go to lobby while he calls and waits for his ride.

## 2023-04-21 NOTE — ED Provider Triage Note (Signed)
Emergency Medicine Provider Triage Evaluation Note  Howard Jackson , a 77 y.o. male  was evaluated in triage.  Pt complains of left upper abdominal pain.    Review of Systems  Positive: Abdominal pain Negative: fever  Physical Exam  BP 120/68 (BP Location: Right Arm)   Pulse 71   Resp 16   SpO2 98%  Gen:   Awake, no distress   Resp:  Normal effort  MSK:   Moves extremities without difficulty  Other:    Medical Decision Making  Medically screening exam initiated at 3:36 AM.  Appropriate orders placed.  Howard Jackson was informed that the remainder of the evaluation will be completed by another provider, this initial triage assessment does not replace that evaluation, and the importance of remaining in the ED until their evaluation is complete.     Roxy Horseman, PA-C 04/21/23 [phone]

## 2023-04-21 NOTE — ED Notes (Signed)
Per PA Dahlia Client pt ok to go to lobby

## 2023-04-21 NOTE — ED Notes (Signed)
RN called pt's daughter to request ride home for pt. Pt's daughter stated she is unable to pick pt up at this time but is calling other family members to request ride home for pt.

## 2023-04-21 NOTE — ED Triage Notes (Signed)
Pt bibgcems form home with left side epigastric pain. Sharp non radiating pain 8/10 Bp 120/64 70 hr

## 2023-04-21 NOTE — ED Notes (Signed)
Attempted to call pt's daughter to follow up about transportation home for pt with no answer.

## 2023-04-22 ENCOUNTER — Telehealth: Payer: Self-pay

## 2023-04-22 NOTE — Telephone Encounter (Signed)
Spoke with patient's daughter Barrett Henle and informed her of provider recommendations. She spoke with patient and he is agreeable to proceed.

## 2023-04-22 NOTE — Telephone Encounter (Signed)
-----   Message from Cephus Shelling sent at 04/22/2023  2:12 PM EDT ----- Regarding: RE: Please advise re: 10/10 sched Looks like he fell out of his wheelchair.  The ED notes state that his pain resolved and the CT was negative.  I am okay to proceed as long as patient feels like he feels up to it.  Thanks,  Thayer Ohm ----- Message ----- From: Primitivo Gauze, RN Sent: 04/22/2023   9:14 AM EDT To: Cephus Shelling, MD Subject: Please advise re: 10/10 sched                  Patient is scheduled for an AGM on tomorrow. His daughter called to report that he was in the ED on yesterday with LUQ abdominal pain, which has resolved and wanted to make sure he could still proceed with procedure. After reviewing the chart, I advised her that there should not be any contraindication, as long as patient feels up to proceeding, but would verify with you. Please advise.   Thanks,  [employer]

## 2023-04-23 ENCOUNTER — Encounter (HOSPITAL_COMMUNITY)
Admission: RE | Disposition: A | Discharge status: Home / Self Care | Payer: Self-pay | Source: Ambulatory Visit | Attending: Vascular Surgery

## 2023-04-23 ENCOUNTER — Observation Stay (HOSPITAL_COMMUNITY)
Admission: RE | Admit: 2023-04-23 | Discharge: 2023-04-24 | Disposition: A | Discharge status: Home / Self Care | Payer: Medicare (Managed Care) | Source: Ambulatory Visit | Attending: Vascular Surgery | Admitting: Vascular Surgery

## 2023-04-23 ENCOUNTER — Other Ambulatory Visit: Payer: Self-pay

## 2023-04-23 DIAGNOSIS — I69354 Hemiplegia and hemiparesis following cerebral infarction affecting left non-dominant side: Secondary | ICD-10-CM | POA: Diagnosis not present

## 2023-04-23 DIAGNOSIS — I70201 Unspecified atherosclerosis of native arteries of extremities, right leg: Secondary | ICD-10-CM | POA: Diagnosis not present

## 2023-04-23 DIAGNOSIS — G8191 Hemiplegia, unspecified affecting right dominant side: Secondary | ICD-10-CM

## 2023-04-23 DIAGNOSIS — I2510 Atherosclerotic heart disease of native coronary artery without angina pectoris: Secondary | ICD-10-CM | POA: Insufficient documentation

## 2023-04-23 DIAGNOSIS — Z79899 Other long term (current) drug therapy: Secondary | ICD-10-CM | POA: Insufficient documentation

## 2023-04-23 DIAGNOSIS — I70245 Atherosclerosis of native arteries of left leg with ulceration of other part of foot: Principal | ICD-10-CM | POA: Insufficient documentation

## 2023-04-23 DIAGNOSIS — I70222 Atherosclerosis of native arteries of extremities with rest pain, left leg: Secondary | ICD-10-CM

## 2023-04-23 DIAGNOSIS — I110 Hypertensive heart disease with heart failure: Secondary | ICD-10-CM | POA: Diagnosis not present

## 2023-04-23 DIAGNOSIS — E785 Hyperlipidemia, unspecified: Secondary | ICD-10-CM | POA: Insufficient documentation

## 2023-04-23 DIAGNOSIS — I5032 Chronic diastolic (congestive) heart failure: Secondary | ICD-10-CM | POA: Diagnosis not present

## 2023-04-23 DIAGNOSIS — L97529 Non-pressure chronic ulcer of other part of left foot with unspecified severity: Secondary | ICD-10-CM | POA: Insufficient documentation

## 2023-04-23 DIAGNOSIS — Z7902 Long term (current) use of antithrombotics/antiplatelets: Secondary | ICD-10-CM | POA: Insufficient documentation

## 2023-04-23 DIAGNOSIS — S91109A Unspecified open wound of unspecified toe(s) without damage to nail, initial encounter: Secondary | ICD-10-CM

## 2023-04-23 DIAGNOSIS — I739 Peripheral vascular disease, unspecified: Principal | ICD-10-CM | POA: Diagnosis present

## 2023-04-23 HISTORY — PX: ABDOMINAL AORTOGRAM W/LOWER EXTREMITY: CATH118223

## 2023-04-23 HISTORY — PX: PERIPHERAL VASCULAR INTERVENTION: CATH118257

## 2023-04-23 LAB — CBC
HCT: 35.2 % — ABNORMAL LOW (ref 39.0–52.0)
Hemoglobin: 12.1 g/dL — ABNORMAL LOW (ref 13.0–17.0)
MCH: 33.3 pg (ref 26.0–34.0)
MCHC: 34.4 g/dL (ref 30.0–36.0)
MCV: 97 fL (ref 80.0–100.0)
Platelets: 130 10*3/uL — ABNORMAL LOW (ref 150–400)
RBC: 3.63 MIL/uL — ABNORMAL LOW (ref 4.22–5.81)
RDW: 14.9 % (ref 11.5–15.5)
WBC: 8.5 10*3/uL (ref 4.0–10.5)
nRBC: 0 % (ref 0.0–0.2)

## 2023-04-23 LAB — POCT ACTIVATED CLOTTING TIME: Activated Clotting Time: 269 s

## 2023-04-23 LAB — POCT I-STAT, CHEM 8
BUN: 33 mg/dL — ABNORMAL HIGH (ref 8–23)
Calcium, Ion: 0.95 mmol/L — ABNORMAL LOW (ref 1.15–1.40)
Chloride: 98 mmol/L (ref 98–111)
Creatinine, Ser: 0.8 mg/dL (ref 0.61–1.24)
Glucose, Bld: 119 mg/dL — ABNORMAL HIGH (ref 70–99)
HCT: 43 % (ref 39.0–52.0)
Hemoglobin: 14.6 g/dL (ref 13.0–17.0)
Potassium: 4.7 mmol/L (ref 3.5–5.1)
Sodium: 127 mmol/L — ABNORMAL LOW (ref 135–145)
TCO2: 22 mmol/L (ref 22–32)

## 2023-04-23 LAB — CREATININE, SERUM
Creatinine, Ser: 0.61 mg/dL (ref 0.61–1.24)
GFR, Estimated: >60 mL/min (ref >60)

## 2023-04-23 SURGERY — ABDOMINAL AORTOGRAM W/LOWER EXTREMITY
Anesthesia: LOCAL

## 2023-04-23 MED ORDER — HEPARIN SODIUM (PORCINE) 5000 UNIT/ML IJ SOLN
5000.0000 [IU] | Freq: Three times a day (TID) | INTRAMUSCULAR | Status: DC
  Administered 2023-04-23 – 2023-04-24 (×2): 5000 [IU] via SUBCUTANEOUS
  Filled 2023-04-23 (×2): qty 1

## 2023-04-23 MED ORDER — NIFEDIPINE ER OSMOTIC RELEASE 60 MG PO TB24
60.0000 mg | ORAL_TABLET | Freq: Every evening | ORAL | Status: DC
  Administered 2023-04-23: 60 mg via ORAL
  Filled 2023-04-23 (×2): qty 1

## 2023-04-23 MED ORDER — GUAIFENESIN-DM 100-10 MG/5ML PO SYRP: 15.0000 mL | ORAL_SOLUTION | ORAL | Status: DC | PRN

## 2023-04-23 MED ORDER — TRAMADOL HCL 50 MG PO TABS
25.0000 mg | ORAL_TABLET | Freq: Every day | ORAL | Status: DC | PRN
  Administered 2023-04-23: 25 mg via ORAL
  Administered 2023-04-24: 50 mg via ORAL
  Filled 2023-04-23 (×2): qty 1

## 2023-04-23 MED ORDER — TRIAMCINOLONE ACETONIDE 0.1 % EX OINT: 1.0000 | TOPICAL_OINTMENT | Freq: Two times a day (BID) | CUTANEOUS | Status: DC | PRN

## 2023-04-23 MED ORDER — LABETALOL HCL 5 MG/ML IV SOLN: 10.0000 mg | INTRAVENOUS | Status: DC | PRN

## 2023-04-23 MED ORDER — CLOPIDOGREL BISULFATE 75 MG PO TABS: 300.0000 mg | ORAL_TABLET | Freq: Once | ORAL | Status: AC

## 2023-04-23 MED ORDER — MAGNESIUM HYDROXIDE 400 MG/5ML PO SUSP: 30.0000 mL | Freq: Every day | ORAL | Status: DC | PRN

## 2023-04-23 MED ORDER — ALUM & MAG HYDROXIDE-SIMETH 200-200-20 MG/5ML PO SUSP: 15.0000 mL | ORAL | Status: DC | PRN

## 2023-04-23 MED ORDER — SODIUM CHLORIDE 0.9% FLUSH: 3.0000 mL | INTRAVENOUS | Status: DC | PRN

## 2023-04-23 MED ORDER — DICLOFENAC SODIUM 1 % EX GEL: 4.0000 g | Freq: Four times a day (QID) | CUTANEOUS | Status: DC | PRN

## 2023-04-23 MED ORDER — HYDRALAZINE HCL 20 MG/ML IJ SOLN: 5.0000 mg | INTRAMUSCULAR | Status: DC | PRN

## 2023-04-23 MED ORDER — NIFEDIPINE ER OSMOTIC RELEASE 30 MG PO TB24
30.0000 mg | ORAL_TABLET | Freq: Every morning | ORAL | Status: DC
  Administered 2023-04-24: 30 mg via ORAL
  Filled 2023-04-23: qty 1

## 2023-04-23 MED ORDER — HYDROCORTISONE 1 % EX CREA: 1.0000 | TOPICAL_CREAM | Freq: Four times a day (QID) | CUTANEOUS | Status: DC | PRN

## 2023-04-23 MED ORDER — CLOPIDOGREL BISULFATE 300 MG PO TABS
ORAL_TABLET | ORAL | Status: AC
  Filled 2023-04-23: qty 1

## 2023-04-23 MED ORDER — PANTOPRAZOLE SODIUM 40 MG PO TBEC
40.0000 mg | DELAYED_RELEASE_TABLET | Freq: Every day | ORAL | Status: DC
  Administered 2023-04-23 – 2023-04-24 (×2): 40 mg via ORAL
  Filled 2023-04-23 (×2): qty 1

## 2023-04-23 MED ORDER — POTASSIUM CHLORIDE CRYS ER 20 MEQ PO TBCR: 20.0000 meq | EXTENDED_RELEASE_TABLET | Freq: Once | ORAL | Status: DC

## 2023-04-23 MED ORDER — ZINC OXIDE 40 % EX PSTE: 1.0000 | PASTE | CUTANEOUS | Status: DC | PRN

## 2023-04-23 MED ORDER — LIDOCAINE HCL (PF) 1 % IJ SOLN
INTRAMUSCULAR | Status: DC | PRN
  Administered 2023-04-23: 10 mL
  Administered 2023-04-23: 2 mL

## 2023-04-23 MED ORDER — IODIXANOL 320 MG/ML IV SOLN
INTRAVENOUS | Status: DC | PRN
  Administered 2023-04-23: 120 mL via INTRA_ARTERIAL

## 2023-04-23 MED ORDER — PHENOL 1.4 % MT LIQD: 1.0000 | OROMUCOSAL | Status: DC | PRN

## 2023-04-23 MED ORDER — METOPROLOL TARTRATE 5 MG/5ML IV SOLN: 2.0000 mg | INTRAVENOUS | Status: DC | PRN

## 2023-04-23 MED ORDER — SODIUM CHLORIDE 0.9 % IV SOLN: INTRAVENOUS | Status: DC

## 2023-04-23 MED ORDER — SODIUM CHLORIDE 0.9 % IV SOLN: INTRAVENOUS | Status: AC

## 2023-04-23 MED ORDER — LABETALOL HCL 200 MG PO TABS
300.0000 mg | ORAL_TABLET | Freq: Two times a day (BID) | ORAL | Status: DC
  Administered 2023-04-23 – 2023-04-24 (×2): 300 mg via ORAL
  Filled 2023-04-23 (×2): qty 2

## 2023-04-23 MED ORDER — BISACODYL 10 MG RE SUPP: 10.0000 mg | Freq: Every day | RECTAL | Status: DC | PRN

## 2023-04-23 MED ORDER — SENNOSIDES-DOCUSATE SODIUM 8.6-50 MG PO TABS
2.0000 | ORAL_TABLET | Freq: Every day | ORAL | Status: DC
  Administered 2023-04-23: 2 via ORAL
  Filled 2023-04-23: qty 2

## 2023-04-23 MED ORDER — ASPIRIN 81 MG PO CHEW
CHEWABLE_TABLET | ORAL | Status: AC
  Filled 2023-04-23: qty 1

## 2023-04-23 MED ORDER — ASPIRIN 81 MG PO CHEW
CHEWABLE_TABLET | ORAL | Status: DC | PRN
  Administered 2023-04-23: 81 mg via ORAL

## 2023-04-23 MED ORDER — SODIUM CHLORIDE 0.9 % IV SOLN: 250.0000 mL | INTRAVENOUS | Status: AC | PRN

## 2023-04-23 MED ORDER — ONDANSETRON HCL 4 MG/2ML IJ SOLN: 4.0000 mg | Freq: Four times a day (QID) | INTRAMUSCULAR | Status: DC | PRN

## 2023-04-23 MED ORDER — POLYETHYLENE GLYCOL 3350 17 G PO PACK
17.0000 g | PACK | Freq: Every day | ORAL | Status: DC
  Administered 2023-04-23 – 2023-04-24 (×2): 17 g via ORAL
  Filled 2023-04-23 (×2): qty 1

## 2023-04-23 MED ORDER — FENTANYL CITRATE (PF) 100 MCG/2ML IJ SOLN
INTRAMUSCULAR | Status: AC
  Filled 2023-04-23: qty 2

## 2023-04-23 MED ORDER — HEPARIN SODIUM (PORCINE) 1000 UNIT/ML IJ SOLN
INTRAMUSCULAR | Status: DC | PRN
  Administered 2023-04-23: 1000 [IU] via INTRAVENOUS
  Administered 2023-04-23: 6000 [IU] via INTRAVENOUS

## 2023-04-23 MED ORDER — ROSUVASTATIN CALCIUM 20 MG PO TABS
20.0000 mg | ORAL_TABLET | Freq: Every day | ORAL | Status: DC
  Administered 2023-04-23 – 2023-04-24 (×2): 20 mg via ORAL
  Filled 2023-04-23 (×2): qty 1

## 2023-04-23 MED ORDER — HEPARIN SODIUM (PORCINE) 1000 UNIT/ML IJ SOLN
INTRAMUSCULAR | Status: AC
  Filled 2023-04-23: qty 10

## 2023-04-23 MED ORDER — PREDNISONE 20 MG PO TABS
60.0000 mg | ORAL_TABLET | Freq: Every day | ORAL | Status: DC
  Administered 2023-04-24: 60 mg via ORAL
  Filled 2023-04-23: qty 3

## 2023-04-23 MED ORDER — CLOPIDOGREL BISULFATE 75 MG PO TABS: 75.0000 mg | ORAL_TABLET | Freq: Every day | ORAL | Status: DC

## 2023-04-23 MED ORDER — FINASTERIDE 5 MG PO TABS
5.0000 mg | ORAL_TABLET | Freq: Every day | ORAL | Status: DC
  Administered 2023-04-23 – 2023-04-24 (×2): 5 mg via ORAL
  Filled 2023-04-23 (×2): qty 1

## 2023-04-23 MED ORDER — TIZANIDINE HCL 4 MG PO TABS: 2.0000 mg | ORAL_TABLET | Freq: Every day | ORAL | Status: DC | PRN

## 2023-04-23 MED ORDER — GABAPENTIN 100 MG PO CAPS: 100.0000 mg | ORAL_CAPSULE | Freq: Every day | ORAL | Status: DC | PRN

## 2023-04-23 MED ORDER — MIDAZOLAM HCL 2 MG/2ML IJ SOLN
INTRAMUSCULAR | Status: AC
  Filled 2023-04-23: qty 2

## 2023-04-23 MED ORDER — VITAMIN D 25 MCG (1000 UNIT) PO TABS
2000.0000 [IU] | ORAL_TABLET | ORAL | Status: DC
  Administered 2023-04-24: 2000 [IU] via ORAL
  Filled 2023-04-23: qty 5
  Filled 2023-04-23: qty 2

## 2023-04-23 MED ORDER — SODIUM CHLORIDE 0.9% FLUSH
3.0000 mL | Freq: Two times a day (BID) | INTRAVENOUS | Status: DC
  Administered 2023-04-23 – 2023-04-24 (×2): 3 mL via INTRAVENOUS

## 2023-04-23 MED ORDER — CLOPIDOGREL BISULFATE 300 MG PO TABS
ORAL_TABLET | ORAL | Status: DC | PRN
  Administered 2023-04-23: 300 mg via ORAL

## 2023-04-23 MED ORDER — ASPIRIN 81 MG PO TBEC
81.0000 mg | DELAYED_RELEASE_TABLET | Freq: Every day | ORAL | Status: DC
  Administered 2023-04-24: 81 mg via ORAL
  Filled 2023-04-23: qty 1

## 2023-04-23 MED ORDER — TAMSULOSIN HCL 0.4 MG PO CAPS
0.4000 mg | ORAL_CAPSULE | Freq: Two times a day (BID) | ORAL | Status: DC
  Administered 2023-04-23 – 2023-04-24 (×2): 0.4 mg via ORAL
  Filled 2023-04-23 (×2): qty 1

## 2023-04-23 MED ORDER — LIDOCAINE HCL (PF) 1 % IJ SOLN
INTRAMUSCULAR | Status: AC
  Filled 2023-04-23: qty 30

## 2023-04-23 MED ORDER — PROSOURCE PLUS PO LIQD
30.0000 mL | Freq: Two times a day (BID) | ORAL | Status: DC
  Administered 2023-04-24: 30 mL via ORAL
  Filled 2023-04-23: qty 30

## 2023-04-23 MED ORDER — CLOPIDOGREL BISULFATE 75 MG PO TABS
75.0000 mg | ORAL_TABLET | Freq: Every day | ORAL | Status: DC
  Administered 2023-04-24: 75 mg via ORAL
  Filled 2023-04-23: qty 1

## 2023-04-23 MED ORDER — FENTANYL CITRATE (PF) 100 MCG/2ML IJ SOLN
INTRAMUSCULAR | Status: DC | PRN
  Administered 2023-04-23: 12.5 ug via INTRAVENOUS
  Administered 2023-04-23: 25 ug via INTRAVENOUS

## 2023-04-23 MED ORDER — RISAQUAD PO CAPS
1.0000 | ORAL_CAPSULE | Freq: Two times a day (BID) | ORAL | Status: DC
  Administered 2023-04-23 – 2023-04-24 (×2): 1 via ORAL
  Filled 2023-04-23 (×2): qty 1

## 2023-04-23 MED ORDER — CLOPIDOGREL BISULFATE 75 MG PO TABS: 75.0000 mg | ORAL_TABLET | Freq: Every day | ORAL | 3 refills | Status: AC

## 2023-04-23 MED ORDER — HEPARIN (PORCINE) IN NACL 2000-0.9 UNIT/L-% IV SOLN
INTRAVENOUS | Status: DC | PRN
  Administered 2023-04-23: 1000 mL

## 2023-04-23 MED ORDER — ASPIRIN 81 MG PO TBEC
81.0000 mg | DELAYED_RELEASE_TABLET | Freq: Every day | ORAL | Status: DC
Start: 2023-04-23 — End: 2023-06-11

## 2023-04-23 MED ORDER — ACETAMINOPHEN 325 MG PO TABS: 650.0000 mg | ORAL_TABLET | ORAL | Status: DC | PRN

## 2023-04-23 MED ORDER — ALBUTEROL SULFATE (2.5 MG/3ML) 0.083% IN NEBU: 2.5000 mg | INHALATION_SOLUTION | Freq: Four times a day (QID) | RESPIRATORY_TRACT | Status: DC | PRN

## 2023-04-23 MED ORDER — MIDAZOLAM HCL 2 MG/2ML IJ SOLN
INTRAMUSCULAR | Status: DC | PRN
  Administered 2023-04-23: .5 mg via INTRAVENOUS

## 2023-04-23 MED ORDER — SPIRONOLACTONE 25 MG PO TABS
50.0000 mg | ORAL_TABLET | Freq: Every day | ORAL | Status: DC
  Administered 2023-04-23 – 2023-04-24 (×2): 50 mg via ORAL
  Filled 2023-04-23 (×2): qty 2

## 2023-04-23 SURGICAL SUPPLY — 31 items
BALLN STERLING OTW 3X100X150 (BALLOONS) ×2
BALLN STERLING OTW 4X100X135 (BALLOONS) ×2
BALLN STERLING OTW 5X150X150 (BALLOONS) ×2
BALLOON STERLING OTW 3X100X150 (BALLOONS) IMPLANT
BALLOON STERLING OTW 4X100X135 (BALLOONS) IMPLANT
BALLOON STERLING OTW 5X150X150 (BALLOONS) IMPLANT
CATH CXI 2.6F 65 ST (CATHETERS) ×2
CATH CXI SUPP ST 4FR 90CM (CATHETERS) IMPLANT
CATH OMNI FLUSH 5F 65CM (CATHETERS) IMPLANT
CATH QUICKCROSS SUPP .035X90CM (MICROCATHETER) IMPLANT
CATH SPRT STRG 65X2.6FR ACPT (CATHETERS) IMPLANT
DEVICE CLOSURE MYNXGRIP 6/7F (Vascular Products) IMPLANT
DEVICE ONE SNARE 10MM (MISCELLANEOUS) IMPLANT
GLIDEWIRE ADV .035X260CM (WIRE) IMPLANT
GUIDEWIRE ANGLED .035X150CM (WIRE) IMPLANT
KIT ENCORE 26 ADVANTAGE (KITS) IMPLANT
KIT MICROPUNCTURE NIT STIFF (SHEATH) IMPLANT
KIT SYRINGE INJ CVI SPIKEX1 (MISCELLANEOUS) IMPLANT
PAD HEMO NEPTUNE PLUS 2X2 (PAD) IMPLANT
SET ATX-X65L (MISCELLANEOUS) IMPLANT
SHEATH CATAPULT 6FR 45 (SHEATH) IMPLANT
SHEATH GLIDE SLENDER 4/5FR (SHEATH) IMPLANT
SHEATH MICROPUNCTURE PEDAL 5FR (SHEATH) IMPLANT
SHEATH PINNACLE 5F 10CM (SHEATH) IMPLANT
SHEATH PINNACLE 6F 10CM (SHEATH) IMPLANT
SHEATH PROBE COVER 6X72 (BAG) IMPLANT
STENT ELUVIA 6X150X130 (Permanent Stent) IMPLANT
STENT ZILVER PTX 5X140 (Permanent Stent) IMPLANT
TRAY PV CATH (CUSTOM PROCEDURE TRAY) ×2 IMPLANT
WIRE BENTSON .035X145CM (WIRE) IMPLANT
WIRE G V18X300CM (WIRE) IMPLANT

## 2023-04-23 NOTE — H&P (Signed)
History and Physical Interval Note:  04/23/2023 7:37 AM  Garnetta Buddy  has presented today for surgery, with the diagnosis of critical limb ischemia with left leg toe wound.  The various methods of treatment have been discussed with the patient and family. After consideration of risks, benefits and other options for treatment, the patient has consented to  Procedure(s): ABDOMINAL AORTOGRAM W/LOWER EXTREMITY (N/A) as a surgical intervention.  The patient's history has been reviewed, patient examined, no change in status, stable for surgery.  I have reviewed the patient's chart and labs.  Questions were answered to the patient's satisfaction.    Left 2nd toe ulcer.  Cephus Shelling     Patient name: Julez Zong   MRN: [id number]        DOB: [birth year]            Sex: male   REASON FOR CONSULT: Left 2nd toe wound   HPI: Travin Suppa is a 77 y.o. male, with hx CAD, HTN, HLD, CVA with left sided weakness that presents for evaluation of left 2nd toe wound.  Patient states his wound occurred yesterday.  He is currently in a skilled nursing facility with goal to rehab home.  He is currently in an electric wheelchair but apparently he is able to stand and transfer and wants to rehab to get back home.  Daughter states he is in wheelchair due to previous stroke with left-sided weakness and also previous fall with hip and back fracture.  He had ABIs that were 0.62 monophasic on the right with a toe pressure of 43 and 0.57 on the left monophasic with a toe pressure of 35       Past Medical History:  Diagnosis Date   Adrenal gland disorder (HCC)      tumor present - no change-  no recent increase     Arthritis     Benign pheochromocytoma of right adrenal gland 06/23/2014    Never had histological diagnosis, but reportedly initially diagnosed in 1989   Bunion      left foot   Complication of anesthesia      "died on the table during cervical fusion" '89 - believed to be d/t  "too much medication" to treat HTN and was later dx with pheochromocytoma   Coronary artery disease      cleared for surg. by Dr. Allyson Sabal - [facility]   H/O hiatal hernia     Hammer toe     Herniated lumbar intervertebral disc     History of anxiety     Hyperlipidemia     Hypertension     Neurogenic bladder     Neuromuscular disorder (HCC)      carpal tunnel problem, even after surgery release    Peripheral arterial disease (HCC)     Primary aldosteronism (HCC)     Stroke (HCC)      in [place]- 1999, L sided weakness, wears a brace on L leg & uses cane               Past Surgical History:  Procedure Laterality Date   ANTERIOR APPROACH HEMI HIP ARTHROPLASTY Left 04/15/2018    Procedure: ANTERIOR APPROACH HEMI HIP ARTHROPLASTY;  Surgeon: Samson Frederic, MD;  Location: [facility] OR;  Service: Orthopedics;  Laterality: Left;   CARPAL TUNNEL RELEASE        bilateral    CERVICAL FUSION       CORONARY ANGIOPLASTY WITH STENT PLACEMENT  pt reports having 5 stents in his heart-2011   HERNIA REPAIR       INGUINAL HERNIA REPAIR Left 11/18/2012    Procedure: HERNIA REPAIR INGUINAL ADULT;  Surgeon: Shelly Rubenstein, MD;  Location: [facility] OR;  Service: General;  Laterality: Left;   INSERTION OF MESH Left 11/18/2012    Procedure: INSERTION OF MESH;  Surgeon: Shelly Rubenstein, MD;  Location: [facility] OR;  Service: General;  Laterality: Left;   IR KYPHO LUMBAR INC FX REDUCE BONE BX UNI/BIL CANNULATION INC/IMAGING   02/20/2023               Family History  Problem Relation Age of Onset   CVA Father     Diabetes Father     Alzheimer's disease Father     Hypertension Sister     Hypertension Brother     CVA Paternal Grandmother     Hypertension Brother     Hypertension Mother            SOCIAL HISTORY: Social History         Socioeconomic History   Marital status: Legally Separated      Spouse name: Not on file   Number of children: Not on file   Years of education: Not on file   Highest education  level: Not on file  Occupational History   Not on file  Tobacco Use   Smoking status: Never   Smokeless tobacco: Never  Vaping Use   Vaping status: Never Used  Substance and Sexual Activity   Alcohol use: Yes      Comment: social - one drink, on special occasion    Drug use: No   Sexual activity: Not on file  Other Topics Concern   Not on file  Social History Narrative   Not on file    Social Determinants of Health        Financial Resource Strain: Not on file  Food Insecurity: Food Insecurity Present (02/12/2023)    Hunger Vital Sign     Worried About Running Out of Food in the Last Year: Sometimes true     Ran Out of Food in the Last Year: Sometimes true  Transportation Needs: No Transportation Needs (02/12/2023)    PRAPARE - Therapist, art (Medical): No     Lack of Transportation (Non-Medical): No  Physical Activity: Not on file  Stress: Not on file  Social Connections: Unknown (11/25/2021)    Received from [facility]    Social Network     Social Network: Not on file  Intimate Partner Violence: Not At Risk (02/12/2023)    Humiliation, Afraid, Rape, and Kick questionnaire     Fear of Current or Ex-Partner: No     Emotionally Abused: No     Physically Abused: No     Sexually Abused: No      Allergies       Allergies  Allergen Reactions   Amitriptyline Hcl Hypertension   Other Shortness Of Breath and Other (See Comments)      SKIN is sensitive- Guards for the underwear MUST be removed immediately after using   Anesthesia- "They almost could not get me back" (HAS TO TAKE SPIRONOLACTONE NOW)   Losartan Potassium Other (See Comments)      Hypotension   Papaya Derivatives Itching and Other (See Comments)      Itching of the throat   Sulfa Antibiotics Other (See Comments)  Constipation               Current Outpatient Medications  Medication Sig Dispense Refill   acetaminophen (TYLENOL) 500 MG tablet Take 500 mg by  mouth every 6 (six) hours as needed for moderate pain or mild pain.       bisacodyl (DULCOLAX) 10 MG suppository Place 1 suppository (10 mg total) rectally daily as needed for moderate constipation. 12 suppository 0   Boswellia Serrata (BOSWELLIA PO) Take 1 capsule by mouth daily as needed (for pain).       Cholecalciferol (VITAMIN D3) 125 MCG (5000 UT) CAPS Take 5,000 Units by mouth every other day.       CITRUS BERGAMOT PO Take 1 capsule by mouth daily as needed (for pain).       clopidogrel (PLAVIX) 75 MG tablet TAKE 1 TABLET BY MOUTH EVERY DAY (Patient taking differently: Take 75 mg by mouth daily.) 90 tablet 3   Coenzyme Q10-Vitamin E (QUNOL ULTRA COQ10) 100-150 MG-UNIT CAPS Take 1 capsule by mouth daily.       finasteride (PROSCAR) 5 MG tablet Take 5 mg by mouth daily.       HYDROcodone-acetaminophen (NORCO/VICODIN) 5-325 MG tablet Take 1 tablet by mouth every 4 (four) hours as needed for moderate pain or severe pain. 20 tablet 0   labetalol (NORMODYNE) 200 MG tablet Take 1 tablet (200 mg total) by mouth 2 (two) times daily. 60 tablet 0   lactobacillus acidophilus (BACID) TABS tablet Take 2 tablets by mouth 2 (two) times daily. (Patient taking differently: Take 2 tablets by mouth daily.)       magnesium hydroxide (MILK OF MAGNESIA) 400 MG/5ML suspension Take 15-30 mLs by mouth daily as needed for mild constipation.       NIFEdipine (ADALAT CC) 30 MG 24 hr tablet Take 30 mg by mouth in the morning.       NIFEdipine (ADALAT CC) 60 MG 24 hr tablet Take 1 tablet (60 mg total) by mouth every evening. 90 tablet 3   NON FORMULARY Take 1-2 capsules by mouth See admin instructions. Nitric Oxide Beet Root Capsules- Take 1-2 capsules by mouth once a day       NON FORMULARY Take 14 g by mouth See admin instructions. MCT Oil - Keto & Vegan MCTs C8, C10 from Coconuts- Mix 14 grams into a desired drink or food and consume by mouth once a day       NON FORMULARY Take 1 capsule by mouth See admin instructions.  [place] Rhubarb - Herbal Supplement for Digestive Health - Natural Formula- Take 1 capsule by mouth once a day as needed to support digestive health       Nutritional Supplements (,FEEDING SUPPLEMENT, PROSOURCE PLUS) liquid Take 30 mLs by mouth 2 (two) times daily between meals. 887 mL 0   Nystatin (GERHARDT'S BUTT CREAM) CREA Apply 1 Application topically continuous as needed for irritation. 30 each 0   pantoprazole (PROTONIX) 40 MG tablet Take 1 tablet (40 mg total) by mouth daily. Take along with prednisone 60 tablet 1   polyethylene glycol (MIRALAX / GLYCOLAX) 17 g packet Take 17 g by mouth daily. 14 each 0   rosuvastatin (CRESTOR) 20 MG tablet TOME UNA TABLETA TODOS LOS DIAS (Patient taking differently: Take 20 mg by mouth daily.) 90 tablet 3   senna-docusate (SENOKOT-S) 8.6-50 MG tablet Take 3 tablets by mouth every evening. (Patient taking differently: Take 3 tablets by mouth daily as needed for mild constipation.)  spironolactone (ALDACTONE) 50 MG tablet Take 1 tablet (50 mg total) by mouth daily. 30 tablet 0   tamsulosin (FLOMAX) 0.4 MG CAPS capsule Take 1 capsule (0.4 mg total) by mouth daily. (Patient taking differently: Take 0.4 mg by mouth in the morning and at bedtime.) 30 capsule 3   tiZANidine (ZANAFLEX) 2 MG tablet Take 1 tablet (2 mg total) by mouth daily as needed for muscle spasms. 30 tablet 0   triamcinolone ointment (KENALOG) 0.1 % Apply 1 Application topically 2 (two) times daily as needed (for dryness).          No current facility-administered medications for this visit.        REVIEW OF SYSTEMS:  [X]  denotes positive finding, [ ]  denotes negative finding Cardiac   Comments:  Chest pain or chest pressure:      Shortness of breath upon exertion:      Short of breath when lying flat:      Irregular heart rhythm:             Vascular      Pain in calf, thigh, or hip brought on by ambulation:      Pain in feet at night that wakes you up from your sleep:        Blood clot in your veins:      Leg swelling:              Pulmonary      Oxygen at home:      Productive cough:       Wheezing:              Neurologic      Sudden weakness in arms or legs:       Sudden numbness in arms or legs:       Sudden onset of difficulty speaking or slurred speech:      Temporary loss of vision in one eye:       Problems with dizziness:              Gastrointestinal      Blood in stool:       Vomited blood:              Genitourinary      Burning when urinating:       Blood in urine:             Psychiatric      Major depression:              Hematologic      Bleeding problems:      Problems with blood clotting too easily:             Skin      Rashes or ulcers:             Constitutional      Fever or chills:          PHYSICAL EXAM:    Vitals:    03/31/23 1533  BP: 121/68  Pulse: 77  Resp: 16  Temp: 98.2 F (36.8 C)  TempSrc: Temporal  SpO2: 93%  Weight: 129 lb (58.5 kg)  Height: 5\' 7"  (1.702 m)      GENERAL: The patient is a well-nourished male, in no acute distress. The vital signs are documented above. CARDIAC: There is a regular rate and rhythm.  VASCULAR:  Right femoral pulse palpable Difficult to appreciate left femoral pulse No palpable pedal pulses Left 2nd toe ulcer  as pictured PULMONARY: No respiratory distress. ABDOMEN: Soft and non-tender . MUSCULOSKELETAL: There are no major deformities or cyanosis. PSYCHIATRIC: The patient has a normal affect.      DATA:    ABIs today are 0.62 monophasic on the right with a toe pressure of 43 and 0.57 on the left monophasic with a toe pressure of 35   Assessment/Plan:   77 y.o. male, with hx CAD, HTN, HLD, CVA with left sided weakness that presents for evaluation of left 2nd toe wound.  Discussed that his ABIs suggest he has moderate vascular disease with an ABI 0.57 on the left and inadequate toe pressure for wound healing.  I discussed with his daughter that his left  toe pressure is 35 and he needs a toe pressure of 60-80 for wound healing.  I have recommended aortogram with lower extremity arteriogram with a focus on the left leg to improve his inflow and pursue limb salvage.  I have a hard time palpating a femoral pulse on the left so he may have inflow disease in the iliac artery and he also may have femoral-popliteal and tibial disease.  Would expect angioplasty versus stenting.  May ultimately require open surgical intervention if no endovascular options.  Risk and benefits discussed.  My office will call to schedule.     Cephus Shelling, MD [facility] Office: [phone]

## 2023-04-23 NOTE — Progress Notes (Signed)
Patient underwent left SFA, popliteal and TP trunk stenting requiring retrograde tibial access for CLI with tissue loss.  His right groin looks fine and he has a brisk PT signal at the left ankle.  I had a long discussion with the patient and daughter at bedside.  Apparently the daughter does not feel comfortable taking care of the patient and does not have any nursing care at home right now for him.  I have engaged social work for assistance.  Will admit for observation overnight and try and get him discharged in the morning.  Daughter is going to work on trying to find some at home care and is talking with [facility].   Cephus Shelling, MD [facility] [place] Office: [phone]   Cephus Shelling

## 2023-04-23 NOTE — Discharge Instructions (Signed)
Femoral Site Care This sheet gives you information about how to care for yourself after your procedure. Your health care provider may also give you more specific instructions. If you have problems or questions, contact your health care provider. What can I expect after the procedure?  After the procedure, it is common to have: Bruising that usually fades within 1-2 weeks. Tenderness at the site. Follow these instructions at home: Wound care Follow instructions from your health care provider about how to take care of your insertion site. Make sure you: Wash your hands with soap and water before you change your bandage (dressing). If soap and water are not available, use hand sanitizer. Remove your dressing as told by your health care provider. In 24 hours Do not take baths, swim, or use a hot tub until your health care provider approves. You may shower 24-48 hours after the procedure or as told by your health care provider. Gently wash the site with plain soap and water. Pat the area dry with a clean towel. Do not rub the site. This may cause bleeding. Do not apply powder or lotion to the site. Keep the site clean and dry. Check your femoral site every day for signs of infection. Check for: Redness, swelling, or pain. Fluid or blood. Warmth. Pus or a bad smell. Activity For the first 2-3 days after your procedure, or as long as directed: Avoid climbing stairs as much as possible. Do not squat. Do not lift anything that is heavier than 10 lb (4.5 kg), or the limit that you are told, until your health care provider says that it is safe. For 5 days Rest as directed. Avoid sitting for a long time without moving. Get up to take short walks every 1-2 hours. Do not drive for 24 hours if you were given a medicine to help you relax (sedative). General instructions Take over-the-counter and prescription medicines only as told by your health care provider. Keep all follow-up visits as told by  your health care provider. This is important. Contact a health care provider if you have: A fever or chills. You have redness, swelling, or pain around your insertion site. Get help right away if: The catheter insertion area swells very fast. You pass out. You suddenly start to sweat or your skin gets clammy. The catheter insertion area is bleeding, and the bleeding does not stop when you hold steady pressure on the area. The area near or just beyond the catheter insertion site becomes pale, cool, tingly, or numb. These symptoms may represent a serious problem that is an emergency. Do not wait to see if the symptoms will go away. Get medical help right away. Call your local emergency services (911 in the [place] Do not drive yourself to the hospital. Summary After the procedure, it is common to have bruising that usually fades within 1-2 weeks. Check your femoral site every day for signs of infection. Do not lift anything that is heavier than 10 lb (4.5 kg), or the limit that you are told, until your health care provider says that it is safe. This information is not intended to replace advice given to you by your health care provider. Make sure you discuss any questions you have with your health care provider. Document Revised: 07/13/2017 Document Reviewed: 07/13/2017 Elsevier Patient Education  2020 Elsevier Inc. 

## 2023-04-23 NOTE — Progress Notes (Signed)
Pt arrived to pv lab and his chart states that he has a blood product refusal form on file, we are required to acknowledge that we reviewed the blood product refusal in our pre procedure time out.  We were unable to find the refusal form in his chart.  pt states he is skeptical about receiving blood products but that he does want blood and blood products if he was in an emergency situation that required blood or blood products, statement witnessed by Roselani Grajeda, Venda Rodes, shelby years, and cynthia marshal  See dr clark's note also  [employer] RT (R), [facility]

## 2023-04-23 NOTE — Progress Notes (Signed)
Patient states there is no caregiver at home for 24 hours after procedure. MD clark notified. Per MD discharge patient home

## 2023-04-23 NOTE — Op Note (Signed)
Patient name: Howard Jackson MRN: [id number] DOB: [birth year] Sex: male  04/23/2023 Pre-operative Diagnosis: Critical limb ischemia of the left lower extremity with tissue loss (second toe ulcer) Post-operative diagnosis:  Same Surgeon:  Cephus Shelling, MD Procedure Performed: 1.  Ultrasound-guided access right common femoral artery 2.  Aortogram with catheter selection of aorta 3.  Left lower extremity arteriogram with catheter selection of left common femoral artery, SFA, popliteal artery 4.  Ultrasound-guided access left posterior tibial artery at the ankle retrograde 5.  Snare of retrograde PT wire for through and through access 6.  Angioplasty and stent of left above and below-knee popliteal artery and TP trunk (5 mm x 140 mm Zilver PTX postdilated with a 4 mm balloon) 7.  Angioplasty and stent of the left SFA (6 mm x 150 mm drug-coated Eluvia x 2 postdilated with a 5 mm balloon) 8.  Left PT angioplasty (3 mm x 100 mm Sterling) 9.  118 minutes monitored moderate conscious sedation time 10.  Mynx closure right common femoral artery  Indications: Patient is a 77 year old male with multiple comorbidities that presented with tissue loss in the left lower extremity while being in a skilled nursing facility.  He is able to stand and transfer.  He had severely depressed ABIs of 0.57 with left 2nd toe ulcer.  We recommended angiogram with possible intervention with a focus on the left leg after risks benefits discussed.  Findings:   Ultrasound-guided access right common femoral artery.  Abdominal aortogram showed a patent infrarenal aorta.  Both renals were patent with some moderate nonflow limiting stenosis in the left renal.  Both iliacs were widely patent.  On the left the common femoral and profunda were patent.  The SFA was diffusely diseased with a mid segment chronic total occlusion.  He then reconstituted the above-knee popliteal artery and had a more distal below-knee  popliteal and TP trunk occlusion.  Dominant runoff is in the posterior tibial that appeared occluded proximally.  The peroneal and AT are occluded.  Tried to get down the left SFA occlusion antegrade from right groin access.  We were unsuccessful.  I then accessed the left PT retrograde at the ankle and got through the TP trunk and below-knee popliteal occlusion and then up to the SFA and through that segment of occlusion and I was able to snare my wire for through and through access.  We then stented the left popliteal artery and TP trunk as well as the SFA as noted above.  I did balloon angioplasty the PT as the dominant runoff.  Inline flow now with widely patent stents.   Procedure:  The patient was identified in the holding area and taken to room 8.  The patient was then placed supine on the table and prepped and draped in the usual sterile fashion.  A time out was called.  Patient received Versed and fentanyl for conscious moderate sedation.  Vital signs were monitored including heart rate, respiratory, oxygenation and blood pressure.  I was presents for all of moderate sedation.  Ultrasound was used to evaluate the right common femoral artery.  It was patent .  A digital ultrasound image was acquired.  A micropuncture needle was used to access the right common femoral artery under ultrasound guidance.  An 018 wire was advanced without resistance and a micropuncture sheath was placed.  The 018 wire was removed and a benson wire was placed.  The micropuncture sheath was exchanged for a 5 french sheath.  An omniflush catheter was advanced over the wire to the level of L-1.  An abdominal angiogram was obtained.  Next, using the omniflush catheter and a benson wire, the aortic bifurcation was crossed and the catheter was placed into theleft external iliac artery and left leg runoff was obtained.  Ultimately after evaluating images we elected for intervention.  I upsized to a 6 [place] catapult sheath in the  right groin over the aortic bifurcation.  Patient was given 100 units/kg IV heparin.  I then went down the left SFA with a Glidewire advantage and a quick cross and tried to cross the chronic total occlusion in the SFA.  This was unsuccessful and I was in a dissection plane.  I could not reenter into the true lumen distally.  Ultimately after multiple attempts, I then prepped the left foot and accessed the left posterior tibial artery retrograde under ultrasound guidance.  This was accessed with a micro access needle placed a microwire and a pedal sheath.  I then used a V18 with a CXI catheter and came up retrograde and then met resistance in the TP trunk and below-knee popliteal artery where there was known chronic total occlusion.  Ultimately I upsized to a Glidewire with a larger catheter and I was able to get through this occluded segment.  I then was up into the SFA chronic total occlusion and after a multitude of attempts I was able to get my wires to kiss and then I used a snared my retrograde V18 wire from right groin access for through and through access.  I then went down and stented the TP trunk as well as the above and below-knee popliteal artery with a 5 mm x 140 mm Zilver postdilated with a 4 mm balloon.  I then stented the mid to distal SFA with 6 mm x 150 mm drug-coated Eluvia's x 2 postdilated with a 5 mm balloon.  I then went down and angioplastied the PT artery as the dominant runoff distal to the stent.  Final imaging showed excellent inline flow into the foot with filling the posterior tibial and plantar arch.  Pedal sheath was removed.  Manual pressure was held.  I put a short 6 French sheath in the right groin and Mynx closure was deployed.  Plan: Excellent results after left SFA popliteal and TP trunk stenting with PT angioplasty.  Now has inline flow.  Aspirin Plavix statin.  Follow-up in 1 month with duplex imaging.   Cephus Shelling, MD [facility]  [place] Office: [phone]  Cephus Shelling

## 2023-04-24 ENCOUNTER — Encounter (HOSPITAL_COMMUNITY): Payer: Self-pay | Admitting: Vascular Surgery

## 2023-04-24 DIAGNOSIS — I70245 Atherosclerosis of native arteries of left leg with ulceration of other part of foot: Secondary | ICD-10-CM | POA: Diagnosis not present

## 2023-04-24 NOTE — Plan of Care (Signed)

## 2023-04-24 NOTE — TOC Transition Note (Signed)
Transition of Care [facility]) - CM/SW Discharge Note   Patient Details  Name: Howard Jackson MRN: [id number] Date of Birth: [birth year]  Transition of Care [facility]) CM/SW Contact:  Janae Bridgeman, RN Phone Number: 04/24/2023, 10:35 AM   Clinical Narrative:    CM called and spoke with the patient's daughter, Aly Hauser by phone and the daughter states that patient has a caregiver at the home that is no longer available for care at the home.  The daughter states that she spoke with [facility] and patient is pending discharge to [facility] today once bed is available.  I called and spoke with Josetta Huddle, MSW with [facility] and she states that she has already spoken with [facility] SNF and completed the FL2.  [place], MSW was notified and will follow up for discharge to the facility via [facility] [place].  Patient has electric wheelchair available in the patient's room.  I sent a message to [place], [place] with vascula to complete discharge summary.  [place], MSW will follow up to communicate discharge plan to the bedside RN.  Vikki Ports, RN will be asked to call report to [facility] nursing home at [phone].   Final next level of care: Skilled Nursing Facility Barriers to Discharge: No Barriers Identified   Patient Goals and CMS Choice CMS Medicare.gov Compare Post Acute Care list provided to:: Patient Represenative (must comment) ([facility] coordinating transport)    Discharge Placement                         Discharge Plan and Services Additional resources added to the After Visit Summary for     Discharge Planning Services: CM Consult Post Acute Care Choice: Skilled Nursing Facility                               Social Determinants of Health (SDOH) Interventions SDOH Screenings   Food Insecurity: No Food Insecurity (04/23/2023)  Recent Concern: Food Insecurity - Food Insecurity Present (02/12/2023)  Housing: Low Risk  (04/23/2023)   Transportation Needs: No Transportation Needs (04/23/2023)  Utilities: Not At Risk (04/23/2023)  Social Connections: Unknown (11/25/2021)   [facility]  Tobacco Use: Low Risk  (03/31/2023)     Readmission Risk Interventions    02/16/2023    3:07 PM  Readmission Risk Prevention Plan  Transportation Screening Complete  PCP or Specialist Appt within 3-5 Days Complete  HRI or Home Care Consult Complete  Social Work Consult for Recovery Care Planning/Counseling Complete  Palliative Care Screening Not Applicable  Medication Review Oceanographer) Complete

## 2023-04-24 NOTE — Progress Notes (Addendum)
  Progress Note    04/24/2023 6:46 AM 1 Day Post-Op  Subjective:  says he is wanting something for his shoulder pain.    Tm 99.6 HR 60's-100's 110's-120's systolic 97% RA  Vitals:   04/23/23 2134 04/24/23 0411  BP: 111/60 126/66  Pulse: 81 82  Resp:    Temp:  99.6 F (37.6 C)  SpO2: 100% 97%    Physical Exam: General:  no distress Lungs:  non labored Incisions:  right groin is soft without hematoma Extremities:  left foot is warm and well perfused Abdomen:  soft  CBC    Component Value Date/Time   WBC 8.5 04/23/2023 1620   RBC 3.63 (L) 04/23/2023 1620   HGB 12.1 (L) 04/23/2023 1620   HGB 15.5 12/16/2019 1502   HCT 35.2 (L) 04/23/2023 1620   HCT 44.6 12/16/2019 1502   PLT 130 (L) 04/23/2023 1620   PLT 193 12/16/2019 1502   MCV 97.0 04/23/2023 1620   MCV 96 12/16/2019 1502   MCH 33.3 04/23/2023 1620   MCHC 34.4 04/23/2023 1620   RDW 14.9 04/23/2023 1620   RDW 12.7 12/16/2019 1502   LYMPHSABS 0.6 (L) 02/21/2023 0325   MONOABS 0.6 02/21/2023 0325   EOSABS 0.0 02/21/2023 0325   BASOSABS 0.0 02/21/2023 0325    BMET    Component Value Date/Time   NA 127 (L) 04/23/2023 0642   NA 136 12/16/2019 1502   K 4.7 04/23/2023 0642   CL 98 04/23/2023 0642   CO2 25 04/21/2023 0352   GLUCOSE 119 (H) 04/23/2023 0642   BUN 33 (H) 04/23/2023 0642   BUN 19 12/16/2019 1502   CREATININE 0.61 04/23/2023 1620   CREATININE 1.13 11/25/2016 1410   CALCIUM 9.2 04/21/2023 0352   GFRNONAA >60 04/23/2023 1620   GFRNONAA 64 11/25/2016 1410   GFRAA 64 12/16/2019 1502   GFRAA 74 11/25/2016 1410    INR    Component Value Date/Time   INR 1.0 02/17/2023 1056     Intake/Output Summary (Last 24 hours) at 04/24/2023 0646 Last data filed at 04/23/2023 1400 Gross per 24 hour  Intake --  Output 400 ml  Net -400 ml      Assessment/Plan:  77 y.o. male is s/p:  Angiogram with angioplasty and stent of left AK and BK popliteal artery and TPT and SFA, left PT angioplasty via  right CFA 04/23/2023 by Dr. Chestine Spore admitted for observation  1 Day Post-Op   -pt right groin is soft without hematoma; left foot is warm and well perfused.  -pt wanting pain medication for shoulder pain.  I had reordered his home pain medication of Tramadol and he gets this monthly.  He will need to f/u with his pain management MD for further management of this.  He did have Norco listed on his home medication list, but this was not ordered as it states completed course and no refills.  -pt was admitted for overnight observation.  Per Dr. Chestine Spore, he has spoken with social work, but I will put in order for TOC/social work to expedite discharge this am. -DVT prophylaxis:  sq heparin   Doreatha Massed, PA-C [facility] [phone] 04/24/2023 6:46 AM  VASCULAR STAFF ADDENDUM: I have independently interviewed and examined the patient. I agree with the above.   Rande Brunt. Lenell Antu, MD [facility] Phone Number: [phone] 04/24/2023 12:19 PM

## 2023-04-24 NOTE — Discharge Summary (Signed)
Discharge Summary    Howard Jackson [birth year] 77 y.o. male  [id number]  Admission Date: 04/23/2023  Discharge Date: 04/24/2023  Physician: Cephus Shelling, MD  Admission Diagnosis: PAD (peripheral artery disease) (HCC) [I73.9]   HPI:   This is a 77 y.o. male with hx CAD, HTN, HLD, CVA with left sided weakness that presents for evaluation of left 2nd toe wound. Patient states his wound occurred yesterday. He is currently in a skilled nursing facility with goal to rehab home. He is currently in an electric wheelchair but apparently he is able to stand and transfer and wants to rehab to get back home. Daughter states he is in wheelchair due to previous stroke with left-sided weakness and also previous fall with hip and back fracture. He had ABIs that were 0.62 monophasic on the right with a toe pressure of 43 and 0.57 on the left monophasic with a toe pressure of 35   Hospital Course:  The patient was admitted to the hospital and taken to the [facility] lab on 04/23/2023 and underwent: 1.  Ultrasound-guided access right common femoral artery 2.  Aortogram with catheter selection of aorta 3.  Left lower extremity arteriogram with catheter selection of left common femoral artery, SFA, popliteal artery 4.  Ultrasound-guided access left posterior tibial artery at the ankle retrograde 5.  Snare of retrograde PT wire for through and through access 6.  Angioplasty and stent of left above and below-knee popliteal artery and TP trunk (5 mm x 140 mm Zilver PTX postdilated with a 4 mm balloon) 7.  Angioplasty and stent of the left SFA (6 mm x 150 mm drug-coated Eluvia x 2 postdilated with a 5 mm balloon) 8.  Left PT angioplasty (3 mm x 100 mm Sterling) 9.  118 minutes monitored moderate conscious sedation time 10.  Mynx closure right common femoral artery    Findings as follows:  Ultrasound-guided access right common femoral artery.  Abdominal aortogram showed a patent infrarenal aorta.   Both renals were patent with some moderate nonflow limiting stenosis in the left renal.  Both iliacs were widely patent.  On the left the common femoral and profunda were patent.  The SFA was diffusely diseased with a mid segment chronic total occlusion.  He then reconstituted the above-knee popliteal artery and had a more distal below-knee popliteal and TP trunk occlusion.  Dominant runoff is in the posterior tibial that appeared occluded proximally.  The peroneal and AT are occluded.   Tried to get down the left SFA occlusion antegrade from right groin access.  We were unsuccessful.  I then accessed the left PT retrograde at the ankle and got through the TP trunk and below-knee popliteal occlusion and then up to the SFA and through that segment of occlusion and I was able to snare my wire for through and through access.  We then stented the left popliteal artery and TP trunk as well as the SFA as noted above.  I did balloon angioplasty the PT as the dominant runoff.  Inline flow now with widely patent stents.  The pt tolerated the procedure well and was transported to the PACU in good condition.   He was admitted for observation as his family was not comfortable taking him home.    On POD 1, pt right groin was soft without hematoma.  His left foot was warm and well perfused.   [facility] to go to [facility] facility and he will be discharged to  their facility today.     CBC    Component Value Date/Time   WBC 8.5 04/23/2023 1620   RBC 3.63 (L) 04/23/2023 1620   HGB 12.1 (L) 04/23/2023 1620   HGB 15.5 12/16/2019 1502   HCT 35.2 (L) 04/23/2023 1620   HCT 44.6 12/16/2019 1502   PLT 130 (L) 04/23/2023 1620   PLT 193 12/16/2019 1502   MCV 97.0 04/23/2023 1620   MCV 96 12/16/2019 1502   MCH 33.3 04/23/2023 1620   MCHC 34.4 04/23/2023 1620   RDW 14.9 04/23/2023 1620   RDW 12.7 12/16/2019 1502   LYMPHSABS 0.6 (L) 02/21/2023 0325   MONOABS 0.6 02/21/2023 0325   EOSABS 0.0  02/21/2023 0325   BASOSABS 0.0 02/21/2023 0325    BMET    Component Value Date/Time   NA 127 (L) 04/23/2023 0642   NA 136 12/16/2019 1502   K 4.7 04/23/2023 0642   CL 98 04/23/2023 0642   CO2 25 04/21/2023 0352   GLUCOSE 119 (H) 04/23/2023 0642   BUN 33 (H) 04/23/2023 0642   BUN 19 12/16/2019 1502   CREATININE 0.61 04/23/2023 1620   CREATININE 1.13 11/25/2016 1410   CALCIUM 9.2 04/21/2023 0352   GFRNONAA >60 04/23/2023 1620   GFRNONAA 64 11/25/2016 1410   GFRAA 64 12/16/2019 1502   GFRAA 74 11/25/2016 1410        Discharge Diagnosis:  PAD (peripheral artery disease) (HCC) [I73.9]  Secondary Diagnosis: Patient Active Problem List   Diagnosis Date Noted   Critical limb ischemia of left lower extremity (HCC) 03/31/2023   Lumbar compression fracture (HCC) 02/12/2023   Malnutrition of moderate degree 12/18/2022   Temporal arteritis (HCC) 12/17/2022   Chronic diastolic CHF (congestive heart failure) (HCC) 12/17/2022   Hyponatremia 12/17/2022   Tooth infection 12/17/2022   Hypercholesterolemia 06/20/2018   Left spastic hemiparesis (HCC) 06/20/2018   Fall 04/14/2018   Fracture of femoral neck, left, closed (HCC) 04/14/2018   Closed displaced fracture of left femoral neck (HCC) 04/14/2018   Bunion    Hypertension due to endocrine disorder 03/07/2017   BPH (benign prostatic hyperplasia) 11/25/2016   Chronic bilateral thoracic back pain 08/20/2016   Mild intermittent asthma 06/18/2016   Wheezing 06/18/2016   Cough 06/18/2016   Left flank pain 04/18/2015   Benign pheochromocytoma of right adrenal gland 06/23/2014   PAD (peripheral artery disease) (HCC) 03/31/2013   HTN (hypertension) 03/31/2013   History of stroke 03/31/2013   Coronary artery disease involving native coronary artery of native heart without angina pectoris 03/31/2013   Renal artery stenosis (HCC) 03/31/2013   Erectile dysfunction 03/31/2013   Dyslipidemia 03/31/2013   Carotid stenosis 03/31/2013    Left inguinal hernia 09/22/2012   Past Medical History:  Diagnosis Date   Adrenal gland disorder (HCC)    tumor present - no change-  no recent increase     Arthritis    Benign pheochromocytoma of right adrenal gland 06/23/2014   Never had histological diagnosis, but reportedly initially diagnosed in 1989   Bunion    left foot   Complication of anesthesia    "died on the table during cervical fusion" '89 - believed to be d/t "too much medication" to treat HTN and was later dx with pheochromocytoma   Coronary artery disease    cleared for surg. by Dr. Allyson Sabal - [facility]   H/O hiatal hernia    Hammer toe    Herniated lumbar intervertebral disc    History of anxiety  Hyperlipidemia    Hypertension    Neurogenic bladder    Neuromuscular disorder (HCC)    carpal tunnel problem, even after surgery release    Peripheral arterial disease (HCC)    Primary aldosteronism (HCC)    Stroke (HCC)    in [place]- 1999, L sided weakness, wears a brace on L leg & uses cane     Allergies as of 04/24/2023       Reactions   Amitriptyline Hcl Hypertension   Other Shortness Of Breath, Other (See Comments)   SKIN is sensitive- Guards for the underwear MUST be removed immediately after using Anesthesia- "They almost could not get me back" (HAS TO TAKE SPIRONOLACTONE NOW)   Losartan Potassium Other (See Comments)   Hypotension   Papaya Derivatives Itching, Other (See Comments)   Itching of the throat   Sulfa Antibiotics Other (See Comments)   Constipation         Medication List     STOP taking these medications    HYDROcodone-acetaminophen 5-325 MG tablet Commonly known as: NORCO/VICODIN       TAKE these medications    (feeding supplement) PROSource Plus liquid Take 30 mLs by mouth 2 (two) times daily between meals.   acetaminophen 500 MG tablet Commonly known as: TYLENOL Take 500 mg by mouth every 6 (six) hours as needed for moderate pain or mild pain.   Arnuity Ellipta 200 MCG/ACT  Aepb Generic drug: Fluticasone Furoate Inhale 1 puff into the lungs daily. Rinse mouth after use   aspirin EC 81 MG tablet Take 1 tablet (81 mg total) by mouth daily. Swallow whole.   bisacodyl 10 MG suppository Commonly known as: DULCOLAX Place 1 suppository (10 mg total) rectally daily as needed for moderate constipation.   clopidogrel 75 MG tablet Commonly known as: PLAVIX Take 1 tablet (75 mg total) by mouth daily.   Co Q 10 100 MG Caps Take 100 mg by mouth daily.   Delsym Cgh/Chest Cong DM Child 5-100 MG/5ML Liqd Generic drug: Dextromethorphan-guaiFENesin Take 10 mLs by mouth every 4 (four) hours as needed (Cough).   diclofenac Sodium 1 % Gel Commonly known as: VOLTAREN Apply 4 g topically 4 (four) times daily as needed (Pain).   finasteride 5 MG tablet Commonly known as: PROSCAR Take 5 mg by mouth daily.   gabapentin 100 MG capsule Commonly known as: NEURONTIN Take 100 mg by mouth daily as needed (pain).   Gerhardt's butt cream Crea Apply 1 Application topically continuous as needed for irritation.   labetalol 200 MG tablet Commonly known as: NORMODYNE Take 1 tablet (200 mg total) by mouth 2 (two) times daily. What changed: how much to take   lactobacillus acidophilus Tabs tablet Take 2 tablets by mouth 2 (two) times daily.   magnesium hydroxide 400 MG/5ML suspension Commonly known as: MILK OF MAGNESIA Take 30 mLs by mouth daily as needed for mild constipation.   multivitamin with minerals tablet Take 1 tablet by mouth daily. Centrum silver ultra men's 300-60-600-300 mcg   NIFEdipine 30 MG 24 hr tablet Commonly known as: ADALAT CC Take 30 mg by mouth in the morning.   NIFEdipine 60 MG 24 hr tablet Commonly known as: ADALAT CC Take 1 tablet (60 mg total) by mouth every evening.   pantoprazole 40 MG tablet Commonly known as: PROTONIX Take 1 tablet (40 mg total) by mouth daily. Take along with prednisone   polyethylene glycol 17 g packet Commonly  known as: MIRALAX / GLYCOLAX Take 17 g  by mouth daily.   predniSONE 20 MG tablet Commonly known as: DELTASONE Take 60 mg by mouth daily with breakfast.   Preparation H Rapid Relief 5-0.25-14.4-15 % Crea Place 1 application  rectally 4 (four) times daily as needed (Hemorrhoid irritation).   ProAir RespiClick 108 (90 Base) MCG/ACT Aepb Generic drug: Albuterol Sulfate Inhale 2 puffs into the lungs every 6 (six) hours as needed (Wheezing/SOB).   protein supplement Liqd Take 30 mLs by mouth 2 (two) times daily.   rosuvastatin 20 MG tablet Commonly known as: CRESTOR TOME UNA TABLETA TODOS LOS DIAS What changed: See the new instructions.   senna-docusate 8.6-50 MG tablet Commonly known as: Senokot-S Take 3 tablets by mouth every evening. What changed:  how much to take when to take this   spironolactone 50 MG tablet Commonly known as: ALDACTONE Take 1 tablet (50 mg total) by mouth daily.   tamsulosin 0.4 MG Caps capsule Commonly known as: FLOMAX Take 1 capsule (0.4 mg total) by mouth daily. What changed: when to take this   tiZANidine 2 MG tablet Commonly known as: ZANAFLEX Take 1 tablet (2 mg total) by mouth daily as needed for muscle spasms.   traMADol HCl 25 MG Tabs Take 1-2 tablets by mouth daily as needed (up to 3 days as needed for pain).   triamcinolone ointment 0.1 % Commonly known as: KENALOG Apply 1 Application topically 2 (two) times daily as needed (for dryness).   Vitamin D3 50 MCG (2000 UT) Tabs Take 2,000 Units by mouth every other day.   Zinc Oxide 40 % Pste Apply 1 Application topically as needed for irritation (Skin protection).          Instructions:  [facility]  Discharge Instructions  Lower Extremity Angiogram; Angioplasty/Stenting  Please refer to the following instructions for your post-procedure care. Your surgeon or physician assistant will discuss any changes with you.  Activity  Avoid lifting  more than 8 pounds (1 gallons of milk) for 72 hours (3 days) after your procedure. You may walk as much as you can tolerate. It's OK to drive after 72 hours.  Bathing/Showering  You may shower the day after your procedure. If you have a bandage, you may remove it at 24- 48 hours. Clean your incision site with mild soap and water. Pat the area dry with a clean towel.  Diet  Resume your pre-procedure diet. There are no special food restrictions following this procedure. All patients with peripheral vascular disease should follow a low fat/low cholesterol diet. In order to heal from your surgery, it is CRITICAL to get adequate nutrition. Your body requires vitamins, minerals, and protein. Vegetables are the best source of vitamins and minerals. Vegetables also provide the perfect balance of protein. Processed food has little nutritional value, so try to avoid this.  Medications  Resume taking all of your medications unless your doctor tells you not to. If your incision is causing pain, you may take over-the-counter pain relievers such as acetaminophen (Tylenol)  Follow Up  Follow up will be arranged at the time of your procedure. You may have an office visit scheduled or may be scheduled for surgery. Ask your surgeon if you have any questions.  Please call us immediately for any of the following conditions: Severe or worsening pain your legs or feet at rest or with walking. Increased pain, redness, drainage at your groin puncture site. Fever of 101 degrees or higher. If you have any mild or slow bleeding  from your puncture site: lie down, apply firm constant pressure over the area with a piece of gauze or a clean wash cloth for 30 minutes- no peeking!, call 911 right away if you are still bleeding after 30 minutes, or if the bleeding is heavy and unmanageable.  Reduce your risk factors of vascular disease:  Stop smoking. If you would like help call [facility] at 1-800-QUIT-NOW ((867) 208-7850)  or [place] at 845-783-2005. Manage your cholesterol Maintain a desired weight Control your diabetes Keep your blood pressure down  If you have any questions, please call the office at [phone]  Prescriptions given:   Aspirin 81mg  daily  No narcotic prescriptions given at discharge.  Pt is on Tramadol and received 224 tablets on 04/20/2023 per PDMP.   Hydrocodone was removed from the medication list as it was a completed course and pt not currently taking.   Disposition: SNF  Patient's condition: is Limited  Follow up: 1. [facility] in 6 weeks with LLE arterial duplex and ABI's   Doreatha Massed, PA-C [facility] [phone] 04/24/2023  11:01 AM

## 2023-05-06 ENCOUNTER — Other Ambulatory Visit: Payer: Medicare (Managed Care)

## 2023-05-14 ENCOUNTER — Telehealth: Payer: Self-pay | Admitting: *Deleted

## 2023-05-14 ENCOUNTER — Other Ambulatory Visit: Payer: Medicare (Managed Care)

## 2023-05-14 NOTE — Telephone Encounter (Signed)
Patients PCP Tonye Becket NP) called in regards to pt.having  Bilateral leg edema. Asked if  pt. could wear compression stocking since his surgery on 04-23-23. [facility]VS Recommended knee high compression stocking 15-20 mmhg. Pt NP will follow up with facility in regards to starting compression stockings.  Advised to call back if any further questions

## 2023-05-18 ENCOUNTER — Other Ambulatory Visit: Payer: Self-pay | Admitting: *Deleted

## 2023-05-18 DIAGNOSIS — I70222 Atherosclerosis of native arteries of extremities with rest pain, left leg: Secondary | ICD-10-CM

## 2023-05-18 DIAGNOSIS — I739 Peripheral vascular disease, unspecified: Secondary | ICD-10-CM

## 2023-05-19 ENCOUNTER — Encounter (HOSPITAL_COMMUNITY): Payer: Medicare (Managed Care)

## 2023-05-22 ENCOUNTER — Ambulatory Visit
Admission: RE | Admit: 2023-05-22 | Discharge: 2023-05-22 | Disposition: A | Discharge status: Home / Self Care | Payer: Medicare (Managed Care) | Source: Ambulatory Visit | Attending: Vascular Surgery | Admitting: Vascular Surgery

## 2023-05-22 DIAGNOSIS — I6523 Occlusion and stenosis of bilateral carotid arteries: Secondary | ICD-10-CM

## 2023-05-25 ENCOUNTER — Other Ambulatory Visit: Payer: Self-pay | Admitting: Vascular Surgery

## 2023-05-25 ENCOUNTER — Ambulatory Visit (INDEPENDENT_AMBULATORY_CARE_PROVIDER_SITE_OTHER)
Admission: RE | Admit: 2023-05-25 | Discharge: 2023-05-25 | Disposition: A | Discharge status: Home / Self Care | Payer: Medicare (Managed Care) | Source: Ambulatory Visit | Attending: Vascular Surgery

## 2023-05-25 ENCOUNTER — Ambulatory Visit (HOSPITAL_COMMUNITY)
Admission: RE | Admit: 2023-05-25 | Discharge: 2023-05-25 | Disposition: A | Discharge status: Home / Self Care | Payer: Medicare (Managed Care) | Source: Ambulatory Visit | Attending: Vascular Surgery | Admitting: Vascular Surgery

## 2023-05-25 DIAGNOSIS — I739 Peripheral vascular disease, unspecified: Secondary | ICD-10-CM | POA: Insufficient documentation

## 2023-05-25 DIAGNOSIS — K869 Disease of pancreas, unspecified: Secondary | ICD-10-CM

## 2023-05-25 DIAGNOSIS — I70222 Atherosclerosis of native arteries of extremities with rest pain, left leg: Secondary | ICD-10-CM | POA: Insufficient documentation

## 2023-05-25 LAB — VAS US ABI WITH/WO TBI
Left ABI: 1.05
Right ABI: 0.43

## 2023-05-26 ENCOUNTER — Encounter: Payer: Self-pay | Admitting: Physician Assistant

## 2023-05-26 ENCOUNTER — Ambulatory Visit (INDEPENDENT_AMBULATORY_CARE_PROVIDER_SITE_OTHER): Payer: Medicare (Managed Care) | Admitting: Physician Assistant

## 2023-05-26 VITALS — BP 130/74 | HR 84 | Temp 98.2°F | Wt 150.0 lb

## 2023-05-26 DIAGNOSIS — I739 Peripheral vascular disease, unspecified: Secondary | ICD-10-CM

## 2023-05-26 NOTE — Progress Notes (Signed)
[facility] [place] HISTORY AND PHYSICAL   History of Present Illness:  Patient is a 77 y.o. year old male who presents for evaluation of   Critical limb ischemia of the left lower extremity with tissue loss (second toe ulcer) s/p endovascular intervention by Dr. Chestine Spore on 04/23/23.    1) Angioplasty and stent of left above and below-knee popliteal artery and TP trunk  2)  Angioplasty and stent of the left SFA (6 mm x 150 mm drug-coated Eluvia x 2 postdilated with a 5 mm balloon)  3)   Left PT angioplasty    He is here for follow up and exam of the left second toe wound for signs of healing.  He is non ambulatory and uses an electric chair for mobility.  The chair does lay flat and goes into reverse trendelenburg for elevation. He resides at a care facility that provides local wound care with medihoney and dry guaze.    He is medically managed on ASA, Plavix and Stain.       Past Medical History:  Diagnosis Date   Adrenal gland disorder (HCC)    tumor present - no change-  no recent increase     Arthritis    Benign pheochromocytoma of right adrenal gland 06/23/2014   Never had histological diagnosis, but reportedly initially diagnosed in 1989   Bunion    left foot   Complication of anesthesia    "died on the table during cervical fusion" '89 - believed to be d/t "too much medication" to treat HTN and was later dx with pheochromocytoma   Coronary artery disease    cleared for surg. by Dr. Allyson Sabal - [facility]   H/O hiatal hernia    Hammer toe    Herniated lumbar intervertebral disc    History of anxiety    Hyperlipidemia    Hypertension    Neurogenic bladder    Neuromuscular disorder (HCC)    carpal tunnel problem, even after surgery release    Peripheral arterial disease (HCC)    Primary aldosteronism (HCC)    Stroke (HCC)    in [place]- 1999, L sided weakness, wears a brace on L leg & uses cane    Past Surgical History:  Procedure Laterality Date   ABDOMINAL  AORTOGRAM W/LOWER EXTREMITY N/A 04/23/2023   Procedure: ABDOMINAL AORTOGRAM W/LOWER EXTREMITY;  Surgeon: Cephus Shelling, MD;  Location: [facility];  Service: Cardiovascular;  Laterality: N/A;   ANTERIOR APPROACH HEMI HIP ARTHROPLASTY Left 04/15/2018   Procedure: ANTERIOR APPROACH HEMI HIP ARTHROPLASTY;  Surgeon: Samson Frederic, MD;  Location: [facility] OR;  Service: Orthopedics;  Laterality: Left;   CARPAL TUNNEL RELEASE     bilateral    CERVICAL FUSION     CORONARY ANGIOPLASTY WITH STENT PLACEMENT     pt reports having 5 stents in his heart-2011   HERNIA REPAIR     INGUINAL HERNIA REPAIR Left 11/18/2012   Procedure: HERNIA REPAIR INGUINAL ADULT;  Surgeon: Shelly Rubenstein, MD;  Location: [facility] OR;  Service: General;  Laterality: Left;   INSERTION OF MESH Left 11/18/2012   Procedure: INSERTION OF MESH;  Surgeon: Shelly Rubenstein, MD;  Location: [facility] OR;  Service: General;  Laterality: Left;   IR KYPHO LUMBAR INC FX REDUCE BONE BX UNI/BIL CANNULATION INC/IMAGING  02/20/2023   PERIPHERAL VASCULAR INTERVENTION  04/23/2023   Procedure: PERIPHERAL VASCULAR INTERVENTION;  Surgeon: Cephus Shelling, MD;  Location: [facility];  Service: Cardiovascular;;  ROS:   General:  No weight loss, Fever, chills  HEENT: No recent headaches, no nasal bleeding, no visual changes, no sore throat  Neurologic: No dizziness, blackouts, seizures. No recent symptoms of stroke or mini- stroke. No recent episodes of slurred speech, or temporary blindness.  Cardiac: No recent episodes of chest pain/pressure, no shortness of breath at rest.  No shortness of breath with exertion.  Denies history of atrial fibrillation or irregular heartbeat  Vascular: No history of rest pain in feet.  No history of claudication.  No history of non-healing ulcer, No history of DVT   Pulmonary: No home oxygen, no productive cough, no hemoptysis,  No asthma or wheezing  Musculoskeletal:  [ ]  Arthritis, [ ]  Low back pain,   [ ]  Joint pain  Hematologic:No history of hypercoagulable state.  No history of easy bleeding.  No history of anemia  Gastrointestinal: No hematochezia or melena,  No gastroesophageal reflux, no trouble swallowing  Urinary: [ ]  chronic Kidney disease, [ ]  on HD - [ ]  MWF or [ ]  TTHS, [ ]  Burning with urination, [ ]  Frequent urination, [ ]  Difficulty urinating;   Skin: No rashes  Psychological: No history of anxiety,  No history of depression  Social History Social History   Tobacco Use   Smoking status: Never   Smokeless tobacco: Never  Vaping Use   Vaping status: Never Used  Substance Use Topics   Alcohol use: Yes    Comment: social - one drink, on special occasion    Drug use: No    Family History Family History  Problem Relation Age of Onset   CVA Father    Diabetes Father    Alzheimer's disease Father    Hypertension Sister    Hypertension Brother    CVA Paternal Grandmother    Hypertension Brother    Hypertension Mother     Allergies  Allergies  Allergen Reactions   Amitriptyline Hcl Hypertension   Other Shortness Of Breath and Other (See Comments)    SKIN is sensitive- Guards for the underwear MUST be removed immediately after using  Anesthesia- "They almost could not get me back" (HAS TO TAKE SPIRONOLACTONE NOW)   Losartan Potassium Other (See Comments)    Hypotension   Papaya Derivatives Itching and Other (See Comments)    Itching of the throat   Sulfa Antibiotics Other (See Comments)    Constipation      Current Outpatient Medications  Medication Sig Dispense Refill   acetaminophen (TYLENOL) 500 MG tablet Take 500 mg by mouth every 6 (six) hours as needed for moderate pain or mild pain.     Albuterol Sulfate (PROAIR RESPICLICK) 108 (90 Base) MCG/ACT AEPB Inhale 2 puffs into the lungs every 6 (six) hours as needed (Wheezing/SOB).     aspirin EC 81 MG tablet Take 1 tablet (81 mg total) by mouth daily. Swallow whole.     bisacodyl (DULCOLAX) 10 MG  suppository Place 1 suppository (10 mg total) rectally daily as needed for moderate constipation. 12 suppository 0   Cholecalciferol (VITAMIN D3) 50 MCG (2000 UT) TABS Take 2,000 Units by mouth every other day.     clopidogrel (PLAVIX) 75 MG tablet Take 1 tablet (75 mg total) by mouth daily. 90 tablet 3   Coenzyme Q10 (CO Q 10) 100 MG CAPS Take 100 mg by mouth daily.     Dextromethorphan-guaiFENesin (DELSYM CGH/CHEST CONG DM CHILD) 5-100 MG/5ML LIQD Take 10 mLs by mouth every 4 (four) hours as  needed (Cough).     diclofenac Sodium (VOLTAREN) 1 % GEL Apply 4 g topically 4 (four) times daily as needed (Pain).     finasteride (PROSCAR) 5 MG tablet Take 5 mg by mouth daily.     Fluticasone Furoate (ARNUITY ELLIPTA) 200 MCG/ACT AEPB Inhale 1 puff into the lungs daily. Rinse mouth after use     gabapentin (NEURONTIN) 100 MG capsule Take 100 mg by mouth daily as needed (pain).     labetalol (NORMODYNE) 200 MG tablet Take 1 tablet (200 mg total) by mouth 2 (two) times daily. (Patient taking differently: Take 300 mg by mouth 2 (two) times daily.) 60 tablet 0   lactobacillus acidophilus (BACID) TABS tablet Take 2 tablets by mouth 2 (two) times daily.     Lido-PE-Glycerin-Petrolatum (PREPARATION H RAPID RELIEF) 5-0.25-14.4-15 % CREA Place 1 application  rectally 4 (four) times daily as needed (Hemorrhoid irritation).     magnesium hydroxide (MILK OF MAGNESIA) 400 MG/5ML suspension Take 30 mLs by mouth daily as needed for mild constipation.     Multiple Vitamins-Minerals (MULTIVITAMIN WITH MINERALS) tablet Take 1 tablet by mouth daily. Centrum silver ultra men's 300-60-600-300 mcg     NIFEdipine (ADALAT CC) 30 MG 24 hr tablet Take 30 mg by mouth in the morning.     NIFEdipine (ADALAT CC) 60 MG 24 hr tablet Take 1 tablet (60 mg total) by mouth every evening. 90 tablet 3   Nutritional Supplements (,FEEDING SUPPLEMENT, PROSOURCE PLUS) liquid Take 30 mLs by mouth 2 (two) times daily between meals. 887 mL 0    Nystatin (GERHARDT'S BUTT CREAM) CREA Apply 1 Application topically continuous as needed for irritation. (Patient not taking: Reported on 04/14/2023) 30 each 0   pantoprazole (PROTONIX) 40 MG tablet Take 1 tablet (40 mg total) by mouth daily. Take along with prednisone 60 tablet 1   polyethylene glycol (MIRALAX / GLYCOLAX) 17 g packet Take 17 g by mouth daily. 14 each 0   predniSONE (DELTASONE) 20 MG tablet Take 60 mg by mouth daily with breakfast.     protein supplement (PROSOURCE NO CARB) LIQD Take 30 mLs by mouth 2 (two) times daily.     rosuvastatin (CRESTOR) 20 MG tablet TOME UNA TABLETA TODOS LOS DIAS (Patient taking differently: Take 20 mg by mouth daily.) 90 tablet 3   senna-docusate (SENOKOT-S) 8.6-50 MG tablet Take 3 tablets by mouth every evening. (Patient taking differently: Take 2 tablets by mouth at bedtime.)     spironolactone (ALDACTONE) 50 MG tablet Take 1 tablet (50 mg total) by mouth daily. 30 tablet 0   tamsulosin (FLOMAX) 0.4 MG CAPS capsule Take 1 capsule (0.4 mg total) by mouth daily. (Patient taking differently: Take 0.4 mg by mouth in the morning and at bedtime.) 30 capsule 3   tiZANidine (ZANAFLEX) 2 MG tablet Take 1 tablet (2 mg total) by mouth daily as needed for muscle spasms. 30 tablet 0   traMADol HCl 25 MG TABS Take 1-2 tablets by mouth daily as needed (up to 3 days as needed for pain).     triamcinolone ointment (KENALOG) 0.1 % Apply 1 Application topically 2 (two) times daily as needed (for dryness).     Zinc Oxide 40 % PSTE Apply 1 Application topically as needed for irritation (Skin protection).     No current facility-administered medications for this visit.    Physical Examination  Vitals:   05/26/23 1402  BP: 130/74  Pulse: 84  Temp: 98.2 F (36.8 C)  SpO2: 96%  Weight: 150 lb (68 kg)    Body mass index is 23.49 kg/m.  General:  Alert and oriented, no acute distress HEENT: Normal Neck: No bruit or JVD Pulmonary: Clear to auscultation  bilaterally Cardiac: Regular Rate and Rhythm without murmur Abdomen: Soft, non-tender, non-distended, no mass, no scars Skin: No rash  Extremity Pulses:  Non palpable pedal pulses B LE, wound appears to be healing Musculoskeletal: No deformity or edema  Neurologic: Left UE non functioning due to CVA, minimal motor in the LE's.  No change  DATA:  ABI Findings:  +--------+------------------+-----+----------+--------+  Right  Rt Pressure (mmHg)IndexWaveform  Comment   +--------+------------------+-----+----------+--------+  [id number]                                       +--------+------------------+-----+----------+--------+  ATA    51                0.40 monophasic          +--------+------------------+-----+----------+--------+  PTA    54                0.43 monophasic          +--------+------------------+-----+----------+--------+   +---------+------------------+-----+---------+-------+  Left    Lt Pressure (mmHg)IndexWaveform Comment  +---------+------------------+-----+---------+-------+  ATA     106               0.84 triphasic         +---------+------------------+-----+---------+-------+  PTA     132               1.05 triphasic         +---------+------------------+-----+---------+-------+  Great Toe52                0.41                   +---------+------------------+-----+---------+-------+   +-------+-----------+-----------+------------+------------+  ABI/TBIToday's ABIToday's TBIPrevious ABIPrevious TBI  +-------+-----------+-----------+------------+------------+  Right 0.43       0.0        0.62        0.32          +-------+-----------+-----------+------------+------------+  Left  1.05       0.41       0.57        0.26            Left Stent(s):  +---------------+--------+--------+----------+--------+  SFA-pop-TPT   PSV cm/sStenosisWaveform  Comments   +---------------+--------+--------+----------+--------+  Prox to Stent  104             triphasic           +---------------+--------+--------+----------+--------+  Proximal Stent 78              triphasic           +---------------+--------+--------+----------+--------+  Mid Stent      59              monophasic          +---------------+--------+--------+----------+--------+  Distal Stent   53              monophasic          +---------------+--------+--------+----------+--------+  Distal to Stent79              monophasic          +---------------+--------+--------+----------+--------+            +-----------+--------+-----+---------------+----------+--------------------  ----+  LEFT  PSV cm/sRatioStenosis       Waveform  Comments                   +-----------+--------+-----+---------------+----------+--------------------  ----+  CFA Distal 89                          triphasic                            +-----------+--------+-----+---------------+----------+--------------------  ----+  DFA       180          30-49% stenosisbiphasic                             +-----------+--------+-----+---------------+----------+--------------------  ----+  SFA Prox   193          30-49% stenosistriphasic                            +-----------+--------+-----+---------------+----------+--------------------  ----+  ATA Distal                                       Unable to insonate-                                                         collateral flow  noted     +-----------+--------+-----+---------------+----------+--------------------  ----+  PTA Prox   116                         monophasic                           +-----------+--------+-----+---------------+----------+--------------------  ----+  PTA Mid    144                         monophasic                            +-----------+--------+-----+---------------+----------+--------------------  ----+  PTA Distal 247          50-74% stenosismonophasic                           +-----------+--------+-----+---------------+----------+--------------------  ----+  PERO Mid   35                          monophasic                           +-----------+--------+-----+---------------+----------+--------------------  ----+  PERO Distal11                          monophasic                           +-----------+--------+-----+---------------+----------+--------------------  ----+    Summary:  Left: 30-49% stenosis noted in the deep femoral artery.  30-49% stenosis  noted in the superficial femoral artery. 50-74% stenosis noted in the  posterior tibial artery. Patent stent with no evidence of stenosis in the  superficial femoral and popliteal  artery artery.   ASSESSMENT:/PLAN:  Patient is a 77 y.o. year old male who presents for evaluation of   Critical limb ischemia of the left lower extremity with tissue loss (second toe ulcer) s/p endovascular intervention by Dr. Chestine Spore on 04/23/23.    1) Angioplasty and stent of left above and below-knee popliteal artery and TP trunk  2)  Angioplasty and stent of the left SFA (6 mm x 150 mm drug-coated Eluvia x 2 postdilated with a 5 mm balloon)  3)   Left PT angioplasty    The duplex shows patent stent with improved inflow in the left LE.  His ABI's have almost double index with reported triphasic flow.  TBI has improved as well.  The wound bed on the left second toe has a beefy red base.   He will f/u in 6 months for repeat studies.  He will continue with local wound care.  The edges of the wound were macerated I suggested the dressing be changed more often if possible.  It he develops worsening of the wound or new wound he will call our office.  His improved blood flow should be sufficient for healing.        Mosetta Pigeon PA-C [facility] [place] Office: [phone]  MD in clinic [place]

## 2023-06-05 ENCOUNTER — Other Ambulatory Visit: Payer: Self-pay

## 2023-06-05 DIAGNOSIS — I739 Peripheral vascular disease, unspecified: Secondary | ICD-10-CM

## 2023-06-09 ENCOUNTER — Emergency Department (HOSPITAL_COMMUNITY): Payer: Medicare (Managed Care)

## 2023-06-09 ENCOUNTER — Inpatient Hospital Stay (HOSPITAL_COMMUNITY)
Admission: EM | Admit: 2023-06-09 | Discharge: 2023-06-18 | DRG: 603 | Disposition: A | Discharge status: Home Health | Payer: Medicare (Managed Care) | Attending: Internal Medicine | Admitting: Internal Medicine

## 2023-06-09 ENCOUNTER — Other Ambulatory Visit: Payer: Self-pay

## 2023-06-09 DIAGNOSIS — Z7401 Bed confinement status: Secondary | ICD-10-CM

## 2023-06-09 DIAGNOSIS — Z882 Allergy status to sulfonamides status: Secondary | ICD-10-CM

## 2023-06-09 DIAGNOSIS — Z833 Family history of diabetes mellitus: Secondary | ICD-10-CM

## 2023-06-09 DIAGNOSIS — Z820 Family history of epilepsy and other diseases of the nervous system: Secondary | ICD-10-CM

## 2023-06-09 DIAGNOSIS — N400 Enlarged prostate without lower urinary tract symptoms: Secondary | ICD-10-CM | POA: Diagnosis present

## 2023-06-09 DIAGNOSIS — I5032 Chronic diastolic (congestive) heart failure: Secondary | ICD-10-CM | POA: Diagnosis present

## 2023-06-09 DIAGNOSIS — L02416 Cutaneous abscess of left lower limb: Secondary | ICD-10-CM | POA: Diagnosis present

## 2023-06-09 DIAGNOSIS — M25552 Pain in left hip: Secondary | ICD-10-CM | POA: Diagnosis present

## 2023-06-09 DIAGNOSIS — M7989 Other specified soft tissue disorders: Secondary | ICD-10-CM | POA: Diagnosis present

## 2023-06-09 DIAGNOSIS — J45909 Unspecified asthma, uncomplicated: Secondary | ICD-10-CM | POA: Diagnosis present

## 2023-06-09 DIAGNOSIS — I739 Peripheral vascular disease, unspecified: Secondary | ICD-10-CM | POA: Diagnosis present

## 2023-06-09 DIAGNOSIS — I69354 Hemiplegia and hemiparesis following cerebral infarction affecting left non-dominant side: Secondary | ICD-10-CM

## 2023-06-09 DIAGNOSIS — Z8249 Family history of ischemic heart disease and other diseases of the circulatory system: Secondary | ICD-10-CM

## 2023-06-09 DIAGNOSIS — Z8679 Personal history of other diseases of the circulatory system: Secondary | ICD-10-CM

## 2023-06-09 DIAGNOSIS — I77810 Thoracic aortic ectasia: Secondary | ICD-10-CM | POA: Diagnosis present

## 2023-06-09 DIAGNOSIS — R7881 Bacteremia: Secondary | ICD-10-CM | POA: Diagnosis present

## 2023-06-09 DIAGNOSIS — E875 Hyperkalemia: Secondary | ICD-10-CM | POA: Diagnosis present

## 2023-06-09 DIAGNOSIS — Z955 Presence of coronary angioplasty implant and graft: Secondary | ICD-10-CM

## 2023-06-09 DIAGNOSIS — M51369 Other intervertebral disc degeneration, lumbar region without mention of lumbar back pain or lower extremity pain: Secondary | ICD-10-CM | POA: Diagnosis present

## 2023-06-09 DIAGNOSIS — Z888 Allergy status to other drugs, medicaments and biological substances status: Secondary | ICD-10-CM

## 2023-06-09 DIAGNOSIS — I472 Ventricular tachycardia, unspecified: Secondary | ICD-10-CM | POA: Diagnosis present

## 2023-06-09 DIAGNOSIS — B9561 Methicillin susceptible Staphylococcus aureus infection as the cause of diseases classified elsewhere: Secondary | ICD-10-CM | POA: Diagnosis present

## 2023-06-09 DIAGNOSIS — R739 Hyperglycemia, unspecified: Secondary | ICD-10-CM | POA: Diagnosis present

## 2023-06-09 DIAGNOSIS — L03116 Cellulitis of left lower limb: Principal | ICD-10-CM | POA: Diagnosis present

## 2023-06-09 DIAGNOSIS — Z95820 Peripheral vascular angioplasty status with implants and grafts: Secondary | ICD-10-CM

## 2023-06-09 DIAGNOSIS — Z981 Arthrodesis status: Secondary | ICD-10-CM

## 2023-06-09 DIAGNOSIS — E2609 Other primary hyperaldosteronism: Secondary | ICD-10-CM | POA: Diagnosis present

## 2023-06-09 DIAGNOSIS — I2510 Atherosclerotic heart disease of native coronary artery without angina pectoris: Secondary | ICD-10-CM | POA: Diagnosis present

## 2023-06-09 DIAGNOSIS — K5900 Constipation, unspecified: Secondary | ICD-10-CM | POA: Diagnosis present

## 2023-06-09 DIAGNOSIS — Z7952 Long term (current) use of systemic steroids: Secondary | ICD-10-CM

## 2023-06-09 DIAGNOSIS — E785 Hyperlipidemia, unspecified: Secondary | ICD-10-CM | POA: Diagnosis present

## 2023-06-09 DIAGNOSIS — I252 Old myocardial infarction: Secondary | ICD-10-CM

## 2023-06-09 DIAGNOSIS — E871 Hypo-osmolality and hyponatremia: Secondary | ICD-10-CM | POA: Diagnosis present

## 2023-06-09 DIAGNOSIS — I6930 Unspecified sequelae of cerebral infarction: Secondary | ICD-10-CM

## 2023-06-09 DIAGNOSIS — M316 Other giant cell arteritis: Secondary | ICD-10-CM | POA: Diagnosis present

## 2023-06-09 DIAGNOSIS — K449 Diaphragmatic hernia without obstruction or gangrene: Secondary | ICD-10-CM | POA: Diagnosis present

## 2023-06-09 DIAGNOSIS — Z7982 Long term (current) use of aspirin: Secondary | ICD-10-CM

## 2023-06-09 DIAGNOSIS — Z823 Family history of stroke: Secondary | ICD-10-CM

## 2023-06-09 DIAGNOSIS — E440 Moderate protein-calorie malnutrition: Secondary | ICD-10-CM | POA: Diagnosis present

## 2023-06-09 DIAGNOSIS — I110 Hypertensive heart disease with heart failure: Secondary | ICD-10-CM | POA: Diagnosis present

## 2023-06-09 DIAGNOSIS — R700 Elevated erythrocyte sedimentation rate: Secondary | ICD-10-CM | POA: Diagnosis present

## 2023-06-09 DIAGNOSIS — Z79899 Other long term (current) drug therapy: Secondary | ICD-10-CM

## 2023-06-09 DIAGNOSIS — M60852 Other myositis, left thigh: Principal | ICD-10-CM

## 2023-06-09 DIAGNOSIS — Q2112 Patent foramen ovale: Secondary | ICD-10-CM

## 2023-06-09 DIAGNOSIS — Z6823 Body mass index (BMI) 23.0-23.9, adult: Secondary | ICD-10-CM

## 2023-06-09 LAB — CBC WITH DIFFERENTIAL/PLATELET
Abs Immature Granulocytes: 0.87 10*3/uL — ABNORMAL HIGH (ref 0.00–0.07)
Basophils Absolute: 0.1 10*3/uL (ref 0.0–0.1)
Basophils Relative: 0 %
Eosinophils Absolute: 0 10*3/uL (ref 0.0–0.5)
Eosinophils Relative: 0 %
HCT: 42 % (ref 39.0–52.0)
Hemoglobin: 14 g/dL (ref 13.0–17.0)
Immature Granulocytes: 4 %
Lymphocytes Relative: 4 %
Lymphs Abs: 0.7 10*3/uL (ref 0.7–4.0)
MCH: 34.2 pg — ABNORMAL HIGH (ref 26.0–34.0)
MCHC: 33.3 g/dL (ref 30.0–36.0)
MCV: 102.7 fL — ABNORMAL HIGH (ref 80.0–100.0)
Monocytes Absolute: 1.7 10*3/uL — ABNORMAL HIGH (ref 0.1–1.0)
Monocytes Relative: 9 %
Neutro Abs: 16.3 10*3/uL — ABNORMAL HIGH (ref 1.7–7.7)
Neutrophils Relative %: 83 %
Platelets: 158 10*3/uL (ref 150–400)
RBC: 4.09 MIL/uL — ABNORMAL LOW (ref 4.22–5.81)
RDW: 14.6 % (ref 11.5–15.5)
WBC: 19.7 10*3/uL — ABNORMAL HIGH (ref 4.0–10.5)
nRBC: 0.1 % (ref 0.0–0.2)

## 2023-06-09 LAB — BASIC METABOLIC PANEL
Anion gap: >20 — ABNORMAL HIGH (ref 5–15)
Anion gap: 13 (ref 5–15)
BUN: 31 mg/dL — ABNORMAL HIGH (ref 8–23)
BUN: 28 mg/dL — ABNORMAL HIGH (ref 8–23)
CO2: 20 mmol/L — ABNORMAL LOW (ref 22–32)
CO2: 22 mmol/L (ref 22–32)
Calcium: 9.5 mg/dL (ref 8.9–10.3)
Calcium: 8.9 mg/dL (ref 8.9–10.3)
Chloride: 91 mmol/L — ABNORMAL LOW (ref 98–111)
Chloride: 97 mmol/L — ABNORMAL LOW (ref 98–111)
Creatinine, Ser: 1 mg/dL (ref 0.61–1.24)
Creatinine, Ser: 0.89 mg/dL (ref 0.61–1.24)
GFR, Estimated: >60 mL/min (ref >60)
GFR, Estimated: >60 mL/min (ref >60)
Glucose, Bld: 163 mg/dL — ABNORMAL HIGH (ref 70–99)
Glucose, Bld: 145 mg/dL — ABNORMAL HIGH (ref 70–99)
Potassium: 4.5 mmol/L (ref 3.5–5.1)
Potassium: 4 mmol/L (ref 3.5–5.1)
Sodium: 134 mmol/L — ABNORMAL LOW (ref 135–145)
Sodium: 132 mmol/L — ABNORMAL LOW (ref 135–145)

## 2023-06-09 LAB — CK: Total CK: 106 U/L (ref 49–397)

## 2023-06-09 LAB — SEDIMENTATION RATE: Sed Rate: 49 mm/h — ABNORMAL HIGH (ref 0–16)

## 2023-06-09 MED ORDER — NIFEDIPINE ER OSMOTIC RELEASE 60 MG PO TB24
60.0000 mg | ORAL_TABLET | Freq: Once | ORAL | Status: AC
  Administered 2023-06-09: 60 mg via ORAL
  Filled 2023-06-09: qty 1

## 2023-06-09 MED ORDER — MORPHINE SULFATE (PF) 2 MG/ML IV SOLN
2.0000 mg | Freq: Once | INTRAVENOUS | Status: AC
  Administered 2023-06-09: 2 mg via INTRAVENOUS
  Filled 2023-06-09: qty 1

## 2023-06-09 MED ORDER — IOHEXOL 300 MG/ML  SOLN
100.0000 mL | Freq: Once | INTRAMUSCULAR | Status: AC | PRN
  Administered 2023-06-09: 100 mL via INTRAVENOUS

## 2023-06-09 MED ORDER — GADOBUTROL 1 MMOL/ML IV SOLN
7.0000 mL | Freq: Once | INTRAVENOUS | Status: AC | PRN
  Administered 2023-06-09: 7 mL via INTRAVENOUS

## 2023-06-09 NOTE — ED Triage Notes (Signed)
Pt from home, via ems, c/o left leg swelling. States he had an operation on his left leg with stents placed and swelling has gotten worse since.  3+ pitting edema left ankle to left knee

## 2023-06-09 NOTE — ED Provider Notes (Signed)
[place] EMERGENCY DEPARTMENT AT [facility] Provider Note   CSN: [id number] Arrival date & time: 06/09/23  1115     History  Chief Complaint  Patient presents with   Leg Swelling    Luna Colella is a 77 y.o. male with a history of stroke, CHF, PAD, and left hip replacement who presents the ED today for left leg swelling.  Patient reports that he has been having left foot and ankle swelling for the past week or so.  But, he states that this morning he woke up with left hip pain, worse to touch and with movement of the leg.  He took some pain medication that he had at home without any improvement.  No redness to the hip, warmth to touch, or fevers.  He has a history of left hip replacement in 2019.  Patient had 5 stents placed in his left lower leg behind the knee and ankle on 04/23/23 with [facility] is currently taking baby aspirin and Plavix daily.  He denies skipping any doses.  Patient is immobile at baseline after a stroke causing left-sided deficits several years ago.    Home Medications Prior to Admission medications   Medication Sig Start Date End Date Taking? Authorizing Provider  acetaminophen (TYLENOL) 500 MG tablet Take 500 mg by mouth every 6 (six) hours as needed for moderate pain or mild pain.    [provider]  Albuterol Sulfate (PROAIR RESPICLICK) 108 (90 Base) MCG/ACT AEPB Inhale 2 puffs into the lungs every 6 (six) hours as needed (Wheezing/SOB).    [provider]  aspirin EC 81 MG tablet Take 1 tablet (81 mg total) by mouth daily. Swallow whole. 04/23/23 04/22/24  Cephus Shelling, MD  bisacodyl (DULCOLAX) 10 MG suppository Place 1 suppository (10 mg total) rectally daily as needed for moderate constipation. 02/23/23   Rolly Salter, MD  Cholecalciferol (VITAMIN D3) 50 MCG (2000 UT) TABS Take 2,000 Units by mouth every other day.    [provider]  clopidogrel (PLAVIX)  75 MG tablet Take 1 tablet (75 mg total) by mouth daily. 04/23/23   Cephus Shelling, MD  Coenzyme Q10 (CO Q 10) 100 MG CAPS Take 100 mg by mouth daily.    [provider]  Dextromethorphan-guaiFENesin (DELSYM CGH/CHEST CONG DM CHILD) 5-100 MG/5ML LIQD Take 10 mLs by mouth every 4 (four) hours as needed (Cough).    [provider]  diclofenac Sodium (VOLTAREN) 1 % GEL Apply 4 g topically 4 (four) times daily as needed (Pain).    [provider]  finasteride (PROSCAR) 5 MG tablet Take 5 mg by mouth daily. 01/19/18   [provider]  Fluticasone Furoate (ARNUITY ELLIPTA) 200 MCG/ACT AEPB Inhale 1 puff into the lungs daily. Rinse mouth after use    [provider]  gabapentin (NEURONTIN) 100 MG capsule Take 100 mg by mouth daily as needed (pain). Patient not taking: Reported on 05/26/2023    [provider]  labetalol (NORMODYNE) 200 MG tablet Take 1 tablet (200 mg total) by mouth 2 (two) times daily. Patient taking differently: Take 300 mg by mouth 2 (two) times daily. 02/23/23   Rolly Salter, MD  lactobacillus acidophilus (BACID) TABS tablet Take 2 tablets by mouth 2 (two) times daily. 04/20/18   Briant Cedar, MD  Lido-PE-Glycerin-Petrolatum (PREPARATION H RAPID RELIEF) 5-0.25-14.4-15 % CREA Place 1 application  rectally 4 (four) times daily as needed (Hemorrhoid irritation).  [provider]  magnesium hydroxide (MILK OF MAGNESIA) 400 MG/5ML suspension Take 30 mLs by mouth daily as needed for mild constipation.    [provider]  Multiple Vitamins-Minerals (MULTIVITAMIN WITH MINERALS) tablet Take 1 tablet by mouth daily. Centrum silver ultra men's 300-60-600-300 mcg    [provider]  NIFEdipine (ADALAT CC) 30 MG 24 hr tablet Take 30 mg by mouth in the morning.    [provider]  NIFEdipine (ADALAT CC) 60 MG 24 hr tablet Take 1 tablet (60 mg total) by mouth every evening. 04/29/22   Croitoru,  Mihai, MD  Nutritional Supplements (,FEEDING SUPPLEMENT, PROSOURCE PLUS) liquid Take 30 mLs by mouth 2 (two) times daily between meals. 02/23/23   Rolly Salter, MD  Nystatin (GERHARDT'S BUTT CREAM) CREA Apply 1 Application topically continuous as needed for irritation. Patient not taking: Reported on 04/14/2023 02/23/23   Rolly Salter, MD  pantoprazole (PROTONIX) 40 MG tablet Take 1 tablet (40 mg total) by mouth daily. Take along with prednisone 12/19/22   Meredeth Ide, MD  polyethylene glycol (MIRALAX / GLYCOLAX) 17 g packet Take 17 g by mouth daily. 02/24/23   Rolly Salter, MD  predniSONE (DELTASONE) 20 MG tablet Take 60 mg by mouth daily with breakfast.    [provider]  protein supplement (PROSOURCE NO CARB) LIQD Take 30 mLs by mouth 2 (two) times daily.    [provider]  rosuvastatin (CRESTOR) 20 MG tablet TOME UNA TABLETA TODOS LOS DIAS Patient taking differently: Take 20 mg by mouth daily. 01/28/21   Croitoru, Mihai, MD  senna-docusate (SENOKOT-S) 8.6-50 MG tablet Take 3 tablets by mouth every evening. Patient taking differently: Take 2 tablets by mouth at bedtime. 04/20/18   Briant Cedar, MD  spironolactone (ALDACTONE) 50 MG tablet Take 1 tablet (50 mg total) by mouth daily. 02/23/23   Rolly Salter, MD  tamsulosin (FLOMAX) 0.4 MG CAPS capsule Take 1 capsule (0.4 mg total) by mouth daily. Patient taking differently: Take 0.4 mg by mouth in the morning and at bedtime. 01/09/17   Massie Maroon, FNP  tiZANidine (ZANAFLEX) 2 MG tablet Take 1 tablet (2 mg total) by mouth daily as needed for muscle spasms. 02/23/23   Rolly Salter, MD  traMADol HCl 25 MG TABS Take 1-2 tablets by mouth daily as needed (up to 3 days as needed for pain). Patient not taking: Reported on 05/26/2023    [provider]  triamcinolone ointment (KENALOG) 0.1 % Apply 1 Application topically 2 (two) times daily as needed (for dryness).    [provider]  Zinc Oxide 40  % PSTE Apply 1 Application topically as needed for irritation (Skin protection).    [provider]      Allergies    Amitriptyline hcl, Other, Losartan potassium, Papaya derivatives, and Sulfa antibiotics    Review of Systems   Review of Systems  Musculoskeletal:        Left leg swelling  All other systems reviewed and are negative.   Physical Exam Updated Vital Signs BP 133/75   Pulse (!) 111   Temp 98 F (36.7 C) (Oral)   Resp 16   Ht 5\' 7"  (1.702 m)   Wt 68 kg   SpO2 97%   BMI 23.49 kg/m  Physical Exam Vitals and nursing note reviewed.  Constitutional:      General: He is not in acute distress.    Appearance: Normal appearance.  HENT:  Head: Normocephalic and atraumatic.     Mouth/Throat:     Mouth: Mucous membranes are moist.  Eyes:     Conjunctiva/sclera: Conjunctivae normal.     Pupils: Pupils are equal, round, and reactive to light.  Cardiovascular:     Rate and Rhythm: Normal rate and regular rhythm.     Pulses: Normal pulses.     Heart sounds: Normal heart sounds.  Pulmonary:     Effort: Pulmonary effort is normal.     Breath sounds: Normal breath sounds.  Abdominal:     Palpations: Abdomen is soft.     Tenderness: There is no abdominal tenderness.  Musculoskeletal:        General: Swelling and tenderness present.     Left lower leg: Edema present.     Comments: Swelling and tenderness to the left lower extremity with pitting edema.  Compartments are soft.  Palpable dorsalis pedis pulse.  Erythema to the left foot and ankle.  No pallor or cyanosis.  Skin:    General: Skin is warm and dry.     Findings: Erythema present. No rash.  Neurological:     General: No focal deficit present.     Mental Status: He is alert.  Psychiatric:        Mood and Affect: Mood normal.        Behavior: Behavior normal.    ED Results / Procedures / Treatments   Labs (all labs ordered are listed, but only abnormal results are displayed) Labs Reviewed - No  data to display  EKG None  Radiology No results found.  Procedures Procedures: not indicated.   Medications Ordered in ED Medications - No data to display  ED Course/ Medical Decision Making/ A&P                                 Medical Decision Making Amount and/or Complexity of Data Reviewed Labs: ordered. Radiology: ordered.  Risk Prescription drug management.   This patient presents to the ED for concern of left leg pain/swelling and left hip pain, this involves an extensive number of treatment options, and is a complaint that carries with it a high risk of complications and morbidity.   Differential diagnosis includes: Cellulitis, septic joint, gout, myositis, post-op complication, etc.   Comorbidities  See HPI above   Additional History  Additional history obtained from prior vascular notes   Lab Tests  I ordered and personally interpreted labs.  The pertinent results include:   WBC of 19.7, otherwise BMP and CBC are within normal limits for patient. Elevated sed rate of 49 CK is within normal limits CRP is pending at shift change   Imaging Studies  I ordered imaging studies including left hip x-ray as well as CT femur, tib/fib, and foot I independently visualized and interpreted imaging which showed:  No acute abnormality seen on x-ray Soft tissue swelling of the calf extending into the thigh and left dorsum of the foot.  No organized fluid collection or abscess.  No soft tissue gas.  Nonspecific with specific edema within the posterior compartment musculature of the thigh and calf. Possible myositis. MRI pending at shift change. I agree with the radiologist interpretation   Consultations  I requested consultation with Dr. Randie Heinz with vascular surgery,  and discussed lab and imaging findings as well as pertinent plan - they recommend: He stated that the swelling and erythema at the ankle and foot  is most likely a side effect of having able to  ambulate after the procedure.  He does not think that the pain that patient is having today as a result of the recent vascular intervention.   Problem List / ED Course / Critical Interventions / Medication Management  Left hip pain and I ordered medications including: Morphine for pain  Reevaluation of the patient after these medicines showed that the patient improved Patient's family is at bedside around 8 PM and daughter was requesting his nighttime medications.  He takes nifedipine, labetalol, and spironolactone at night. Nifedipine given but patient's BP was 120/66, so I did not want to give them all at once. Additionally, patient reported to me that he was complaining about left lower leg heaviness as well.  I informed her that vascular surgeon said the swelling and erythema of the left lower leg is most likely a result of immobility after the procedure.  I informed her of the labs and CT results, that we had back at the time. I have reviewed the patients home medicines and have made adjustments as needed   Social Determinants of Health  Physical activity   Test / Admission - Considered  Patient care signed out to oncoming PA at shift change, Rebekah Sponseller.        Final Clinical Impression(s) / ED Diagnoses Final diagnoses:  None    Rx / DC Orders ED Discharge Orders     None         Maxwell Marion, PA-C 06/09/23 2328    Lorre Nick, MD 06/10/23 [phone]

## 2023-06-09 NOTE — ED Provider Notes (Signed)
I provided a substantive portion of the care of this patient.  I personally made/approved the management plan for this patient and take responsibility for the patient management.      77 year old male presents with left hip pain times several days.  Recently had vascular surgery to his left lower extremity.  On exam, tenderness at the left lateral hip.  The extremity is warm to the touch.  No ecchymosis or limb ischemia appreciated.  Will consult vascular surgery.  CBC shows significant leukocytosis of 19.7 thousand.  Will image patient's left hip as well as perform vascular evaluation episode extremity.   Lorre Nick, MD 06/09/23 1426

## 2023-06-09 NOTE — ED Provider Notes (Incomplete)
  Physical Exam  BP 121/69   Pulse (!) 101   Temp 98.5 F (36.9 C)   Resp 19   Ht 5\' 7"  (1.702 m)   Wt 68 kg   SpO2 95%   BMI 23.49 kg/m   Physical Exam  Procedures  Procedures  ED Course / MDM    Medical Decision Making Amount and/or Complexity of Data Reviewed Labs: ordered.    Details: Significant leukocytosis of 19,000, elevated sed rate to 49.  CRP pending at time of shift change.  CK is normal.  Radiology: ordered.  Risk Prescription drug management.   *** Care of this patient assumed from preceding ED provider Maxwell Marion, PA-C at time of shift change.  Please see her associated note for further insight of the patient's ED course.  In brief patient is a 77 year old male who is bedbound and nonambulatory at baseline with recent operative intervention from vascular surgery in October of this year for lower extremity ischemia.  Presenting today with severe left hip pain.   CT imaging revealing myositis of the left thigh and left calf.  Vascular surgery was consulted and states that lower extremity swelling is typical following this operative intervention I do not feel this is vascular in nature.  Patient is neurovascularly intact in the leg per preceding ED team.  Plan per preceding ED team is that patient will require admission to the hospital for myositis in addition to any findings identified on MR imaging of the left hip.  Patient is not septic at this time.

## 2023-06-10 ENCOUNTER — Encounter (HOSPITAL_COMMUNITY): Payer: Self-pay | Admitting: Internal Medicine

## 2023-06-10 ENCOUNTER — Inpatient Hospital Stay (HOSPITAL_COMMUNITY): Payer: Medicare (Managed Care)

## 2023-06-10 DIAGNOSIS — L03116 Cellulitis of left lower limb: Secondary | ICD-10-CM | POA: Diagnosis present

## 2023-06-10 DIAGNOSIS — I69354 Hemiplegia and hemiparesis following cerebral infarction affecting left non-dominant side: Secondary | ICD-10-CM | POA: Diagnosis not present

## 2023-06-10 DIAGNOSIS — Z7401 Bed confinement status: Secondary | ICD-10-CM | POA: Diagnosis not present

## 2023-06-10 DIAGNOSIS — I6930 Unspecified sequelae of cerebral infarction: Secondary | ICD-10-CM

## 2023-06-10 DIAGNOSIS — I2510 Atherosclerotic heart disease of native coronary artery without angina pectoris: Secondary | ICD-10-CM | POA: Diagnosis present

## 2023-06-10 DIAGNOSIS — M316 Other giant cell arteritis: Secondary | ICD-10-CM | POA: Diagnosis present

## 2023-06-10 DIAGNOSIS — K5900 Constipation, unspecified: Secondary | ICD-10-CM | POA: Diagnosis present

## 2023-06-10 DIAGNOSIS — I739 Peripheral vascular disease, unspecified: Secondary | ICD-10-CM | POA: Diagnosis present

## 2023-06-10 DIAGNOSIS — E875 Hyperkalemia: Secondary | ICD-10-CM | POA: Diagnosis present

## 2023-06-10 DIAGNOSIS — L02416 Cutaneous abscess of left lower limb: Secondary | ICD-10-CM | POA: Diagnosis not present

## 2023-06-10 DIAGNOSIS — E871 Hypo-osmolality and hyponatremia: Secondary | ICD-10-CM

## 2023-06-10 DIAGNOSIS — E785 Hyperlipidemia, unspecified: Secondary | ICD-10-CM | POA: Diagnosis present

## 2023-06-10 DIAGNOSIS — I5032 Chronic diastolic (congestive) heart failure: Secondary | ICD-10-CM

## 2023-06-10 DIAGNOSIS — N400 Enlarged prostate without lower urinary tract symptoms: Secondary | ICD-10-CM

## 2023-06-10 DIAGNOSIS — E2609 Other primary hyperaldosteronism: Secondary | ICD-10-CM | POA: Diagnosis present

## 2023-06-10 DIAGNOSIS — Q2112 Patent foramen ovale: Secondary | ICD-10-CM | POA: Diagnosis not present

## 2023-06-10 DIAGNOSIS — E7849 Other hyperlipidemia: Secondary | ICD-10-CM

## 2023-06-10 DIAGNOSIS — M7989 Other specified soft tissue disorders: Secondary | ICD-10-CM | POA: Diagnosis not present

## 2023-06-10 DIAGNOSIS — R7881 Bacteremia: Secondary | ICD-10-CM | POA: Diagnosis present

## 2023-06-10 DIAGNOSIS — I472 Ventricular tachycardia, unspecified: Secondary | ICD-10-CM | POA: Diagnosis present

## 2023-06-10 DIAGNOSIS — Z981 Arthrodesis status: Secondary | ICD-10-CM | POA: Diagnosis not present

## 2023-06-10 DIAGNOSIS — Z79899 Other long term (current) drug therapy: Secondary | ICD-10-CM | POA: Diagnosis not present

## 2023-06-10 DIAGNOSIS — J45909 Unspecified asthma, uncomplicated: Secondary | ICD-10-CM | POA: Diagnosis present

## 2023-06-10 DIAGNOSIS — Z8679 Personal history of other diseases of the circulatory system: Secondary | ICD-10-CM

## 2023-06-10 DIAGNOSIS — Z955 Presence of coronary angioplasty implant and graft: Secondary | ICD-10-CM | POA: Diagnosis not present

## 2023-06-10 DIAGNOSIS — B9561 Methicillin susceptible Staphylococcus aureus infection as the cause of diseases classified elsewhere: Secondary | ICD-10-CM | POA: Diagnosis present

## 2023-06-10 DIAGNOSIS — Z8249 Family history of ischemic heart disease and other diseases of the circulatory system: Secondary | ICD-10-CM | POA: Diagnosis not present

## 2023-06-10 DIAGNOSIS — I110 Hypertensive heart disease with heart failure: Secondary | ICD-10-CM | POA: Diagnosis present

## 2023-06-10 DIAGNOSIS — M25552 Pain in left hip: Secondary | ICD-10-CM | POA: Diagnosis not present

## 2023-06-10 DIAGNOSIS — E440 Moderate protein-calorie malnutrition: Secondary | ICD-10-CM | POA: Diagnosis present

## 2023-06-10 LAB — COMPREHENSIVE METABOLIC PANEL
ALT: 66 U/L — ABNORMAL HIGH (ref 0–44)
AST: 64 U/L — ABNORMAL HIGH (ref 15–41)
Albumin: 2.5 g/dL — ABNORMAL LOW (ref 3.5–5.0)
Alkaline Phosphatase: 58 U/L (ref 38–126)
Anion gap: 10 (ref 5–15)
BUN: 23 mg/dL (ref 8–23)
CO2: 25 mmol/L (ref 22–32)
Calcium: 8.8 mg/dL — ABNORMAL LOW (ref 8.9–10.3)
Chloride: 95 mmol/L — ABNORMAL LOW (ref 98–111)
Creatinine, Ser: 0.73 mg/dL (ref 0.61–1.24)
GFR, Estimated: >60 mL/min (ref >60)
Glucose, Bld: 141 mg/dL — ABNORMAL HIGH (ref 70–99)
Potassium: 3.5 mmol/L (ref 3.5–5.1)
Sodium: 130 mmol/L — ABNORMAL LOW (ref 135–145)
Total Bilirubin: 1 mg/dL (ref <1.2)
Total Protein: 5.4 g/dL — ABNORMAL LOW (ref 6.5–8.1)

## 2023-06-10 LAB — BLOOD CULTURE ID PANEL (REFLEXED) - BCID2

## 2023-06-10 LAB — C-REACTIVE PROTEIN: CRP: 23.5 mg/dL — ABNORMAL HIGH (ref <1.0)

## 2023-06-10 LAB — CBC
HCT: 36.6 % — ABNORMAL LOW (ref 39.0–52.0)
Hemoglobin: 12 g/dL — ABNORMAL LOW (ref 13.0–17.0)
MCH: 33.5 pg (ref 26.0–34.0)
MCHC: 32.8 g/dL (ref 30.0–36.0)
MCV: 102.2 fL — ABNORMAL HIGH (ref 80.0–100.0)
Platelets: 156 10*3/uL (ref 150–400)
RBC: 3.58 MIL/uL — ABNORMAL LOW (ref 4.22–5.81)
RDW: 14.6 % (ref 11.5–15.5)
WBC: 15.9 10*3/uL — ABNORMAL HIGH (ref 4.0–10.5)
nRBC: 0 % (ref 0.0–0.2)

## 2023-06-10 LAB — MAGNESIUM: Magnesium: 1.6 mg/dL — ABNORMAL LOW (ref 1.7–2.4)

## 2023-06-10 LAB — PROTIME-INR
INR: 1.1 (ref 0.8–1.2)
Prothrombin Time: 14.6 s (ref 11.4–15.2)

## 2023-06-10 LAB — APTT: aPTT: 34 s (ref 24–36)

## 2023-06-10 MED ORDER — ENSURE ENLIVE PO LIQD
237.0000 mL | Freq: Three times a day (TID) | ORAL | Status: DC
  Administered 2023-06-10 – 2023-06-17 (×17): 237 mL via ORAL

## 2023-06-10 MED ORDER — SODIUM CHLORIDE 0.9 % IV SOLN: 250.0000 mL | INTRAVENOUS | Status: DC | PRN

## 2023-06-10 MED ORDER — MORPHINE SULFATE 15 MG PO TABS
15.0000 mg | ORAL_TABLET | ORAL | Status: DC | PRN
  Administered 2023-06-10 – 2023-06-17 (×8): 15 mg via ORAL
  Filled 2023-06-10 (×8): qty 1

## 2023-06-10 MED ORDER — METHOCARBAMOL 500 MG PO TABS
500.0000 mg | ORAL_TABLET | Freq: Three times a day (TID) | ORAL | Status: DC
  Administered 2023-06-10 – 2023-06-18 (×23): 500 mg via ORAL
  Filled 2023-06-10 (×24): qty 1

## 2023-06-10 MED ORDER — CLOPIDOGREL BISULFATE 75 MG PO TABS
75.0000 mg | ORAL_TABLET | Freq: Every day | ORAL | Status: DC
  Administered 2023-06-10 – 2023-06-18 (×9): 75 mg via ORAL
  Filled 2023-06-10 (×10): qty 1

## 2023-06-10 MED ORDER — PROSOURCE PLUS PO LIQD
30.0000 mL | Freq: Two times a day (BID) | ORAL | Status: DC
Start: 2023-06-10 — End: 2023-06-18
  Administered 2023-06-10 – 2023-06-18 (×10): 30 mL via ORAL
  Filled 2023-06-10 (×23): qty 30

## 2023-06-10 MED ORDER — SENNOSIDES-DOCUSATE SODIUM 8.6-50 MG PO TABS
1.0000 | ORAL_TABLET | Freq: Two times a day (BID) | ORAL | Status: DC
  Administered 2023-06-10 – 2023-06-13 (×7): 1 via ORAL
  Filled 2023-06-10 (×7): qty 1

## 2023-06-10 MED ORDER — SODIUM CHLORIDE 0.9% FLUSH
3.0000 mL | Freq: Two times a day (BID) | INTRAVENOUS | Status: DC
  Administered 2023-06-10 (×3): 3 mL via INTRAVENOUS

## 2023-06-10 MED ORDER — ACETAMINOPHEN 325 MG PO TABS
650.0000 mg | ORAL_TABLET | Freq: Four times a day (QID) | ORAL | Status: DC | PRN
  Administered 2023-06-10 – 2023-06-17 (×2): 650 mg via ORAL
  Filled 2023-06-10 (×3): qty 2

## 2023-06-10 MED ORDER — ONDANSETRON HCL 4 MG PO TABS: 4.0000 mg | ORAL_TABLET | Freq: Four times a day (QID) | ORAL | Status: DC | PRN

## 2023-06-10 MED ORDER — ENSURE MAX PROTEIN PO LIQD
11.0000 [oz_av] | Freq: Two times a day (BID) | ORAL | Status: DC
  Administered 2023-06-10 (×2): 11 [oz_av] via ORAL
  Administered 2023-06-11: 11 mL via ORAL
  Administered 2023-06-11 – 2023-06-17 (×7): 11 [oz_av] via ORAL
  Filled 2023-06-10 (×16): qty 330

## 2023-06-10 MED ORDER — CEFAZOLIN SODIUM-DEXTROSE 1-4 GM/50ML-% IV SOLN
1.0000 g | Freq: Three times a day (TID) | INTRAVENOUS | Status: DC
  Administered 2023-06-10: 1 g via INTRAVENOUS
  Filled 2023-06-10 (×3): qty 50

## 2023-06-10 MED ORDER — MAGNESIUM SULFATE 2 GM/50ML IV SOLN
2.0000 g | INTRAVENOUS | Status: AC
  Administered 2023-06-10: 2 g via INTRAVENOUS
  Filled 2023-06-10: qty 50

## 2023-06-10 MED ORDER — ADULT MULTIVITAMIN W/MINERALS CH
1.0000 | ORAL_TABLET | Freq: Every day | ORAL | Status: DC
  Administered 2023-06-10 – 2023-06-18 (×9): 1 via ORAL
  Filled 2023-06-10 (×9): qty 1

## 2023-06-10 MED ORDER — ENOXAPARIN SODIUM 40 MG/0.4ML IJ SOSY
40.0000 mg | PREFILLED_SYRINGE | INTRAMUSCULAR | Status: DC
  Administered 2023-06-10 – 2023-06-17 (×8): 40 mg via SUBCUTANEOUS
  Filled 2023-06-10 (×8): qty 0.4

## 2023-06-10 MED ORDER — ONDANSETRON HCL 4 MG/2ML IJ SOLN: 4.0000 mg | Freq: Four times a day (QID) | INTRAMUSCULAR | Status: DC | PRN

## 2023-06-10 MED ORDER — ASPIRIN 81 MG PO TBEC
81.0000 mg | DELAYED_RELEASE_TABLET | Freq: Every day | ORAL | Status: DC
  Administered 2023-06-10 – 2023-06-18 (×9): 81 mg via ORAL
  Filled 2023-06-10 (×9): qty 1

## 2023-06-10 MED ORDER — ACETAMINOPHEN 650 MG RE SUPP: 650.0000 mg | Freq: Four times a day (QID) | RECTAL | Status: DC | PRN

## 2023-06-10 MED ORDER — CEFAZOLIN SODIUM-DEXTROSE 2-4 GM/100ML-% IV SOLN: 2.0000 g | Freq: Three times a day (TID) | INTRAVENOUS | Status: DC

## 2023-06-10 MED ORDER — SODIUM CHLORIDE 0.9% FLUSH: 3.0000 mL | INTRAVENOUS | Status: DC | PRN

## 2023-06-10 MED ORDER — SPIRONOLACTONE 25 MG PO TABS
50.0000 mg | ORAL_TABLET | Freq: Every day | ORAL | Status: DC
  Administered 2023-06-11 – 2023-06-13 (×3): 50 mg via ORAL
  Filled 2023-06-10 (×4): qty 2

## 2023-06-10 MED ORDER — METOPROLOL TARTRATE 5 MG/5ML IV SOLN
2.5000 mg | INTRAVENOUS | Status: DC | PRN
  Administered 2023-06-10: 2.5 mg via INTRAVENOUS
  Filled 2023-06-10: qty 5

## 2023-06-10 MED ORDER — OXYCODONE-ACETAMINOPHEN 5-325 MG PO TABS: 1.0000 | ORAL_TABLET | Freq: Three times a day (TID) | ORAL | Status: DC | PRN

## 2023-06-10 MED ORDER — LABETALOL HCL 200 MG PO TABS
200.0000 mg | ORAL_TABLET | Freq: Two times a day (BID) | ORAL | Status: DC
  Administered 2023-06-10 – 2023-06-18 (×16): 200 mg via ORAL
  Filled 2023-06-10: qty 2
  Filled 2023-06-10: qty 1
  Filled 2023-06-10 (×7): qty 2
  Filled 2023-06-10 (×2): qty 1
  Filled 2023-06-10 (×4): qty 2
  Filled 2023-06-10: qty 1

## 2023-06-10 MED ORDER — CEFAZOLIN SODIUM-DEXTROSE 2-4 GM/100ML-% IV SOLN
2.0000 g | Freq: Three times a day (TID) | INTRAVENOUS | Status: DC
  Administered 2023-06-10 – 2023-06-15 (×14): 2 g via INTRAVENOUS
  Filled 2023-06-10 (×16): qty 100

## 2023-06-10 MED ORDER — METHOCARBAMOL 500 MG PO TABS
500.0000 mg | ORAL_TABLET | Freq: Three times a day (TID) | ORAL | Status: DC | PRN
  Administered 2023-06-10: 500 mg via ORAL
  Filled 2023-06-10: qty 1

## 2023-06-10 MED ORDER — KETOROLAC TROMETHAMINE 15 MG/ML IJ SOLN
15.0000 mg | Freq: Four times a day (QID) | INTRAMUSCULAR | Status: DC
  Administered 2023-06-10 – 2023-06-12 (×8): 15 mg via INTRAVENOUS
  Filled 2023-06-10 (×8): qty 1

## 2023-06-10 MED ORDER — TAMSULOSIN HCL 0.4 MG PO CAPS
0.4000 mg | ORAL_CAPSULE | Freq: Every day | ORAL | Status: DC
  Administered 2023-06-10 – 2023-06-18 (×9): 0.4 mg via ORAL
  Filled 2023-06-10 (×9): qty 1

## 2023-06-10 MED ORDER — MORPHINE SULFATE (PF) 2 MG/ML IV SOLN
2.0000 mg | INTRAVENOUS | Status: DC | PRN
  Administered 2023-06-13 – 2023-06-16 (×4): 2 mg via INTRAVENOUS
  Filled 2023-06-10 (×4): qty 1

## 2023-06-10 MED ORDER — PREDNISONE 20 MG PO TABS
60.0000 mg | ORAL_TABLET | Freq: Every day | ORAL | Status: DC
  Administered 2023-06-11: 60 mg via ORAL
  Filled 2023-06-10 (×2): qty 3

## 2023-06-10 MED ORDER — SENNOSIDES-DOCUSATE SODIUM 8.6-50 MG PO TABS: 1.0000 | ORAL_TABLET | Freq: Every evening | ORAL | Status: DC | PRN

## 2023-06-10 MED ORDER — ROSUVASTATIN CALCIUM 20 MG PO TABS
20.0000 mg | ORAL_TABLET | Freq: Every day | ORAL | Status: DC
  Administered 2023-06-10 – 2023-06-18 (×9): 20 mg via ORAL
  Filled 2023-06-10 (×9): qty 1

## 2023-06-10 MED ORDER — TIZANIDINE HCL 4 MG PO TABS
2.0000 mg | ORAL_TABLET | Freq: Every day | ORAL | Status: DC | PRN
  Filled 2023-06-10: qty 1

## 2023-06-10 MED ORDER — ACETAMINOPHEN 500 MG PO TABS
1000.0000 mg | ORAL_TABLET | Freq: Two times a day (BID) | ORAL | Status: DC
  Administered 2023-06-10 – 2023-06-18 (×16): 1000 mg via ORAL
  Filled 2023-06-10 (×17): qty 2

## 2023-06-10 MED ORDER — PANTOPRAZOLE SODIUM 40 MG PO TBEC
40.0000 mg | DELAYED_RELEASE_TABLET | Freq: Every day | ORAL | Status: DC
  Administered 2023-06-11 – 2023-06-18 (×7): 40 mg via ORAL
  Filled 2023-06-10 (×8): qty 1

## 2023-06-10 MED ORDER — LEVALBUTEROL HCL 0.63 MG/3ML IN NEBU: 0.6300 mg | INHALATION_SOLUTION | Freq: Four times a day (QID) | RESPIRATORY_TRACT | Status: DC | PRN

## 2023-06-10 NOTE — H&P (Addendum)
History and Physical    Howard Jackson WUJ:[id number] DOB: 1945/11/30 DOA: 06/09/2023  PCP: [facility]   Patient coming from: Home   Chief Complaint:  Chief Complaint  Patient presents with   Leg Swelling   ED TRIAGE note:Pt from home, via ems, c/o left leg swelling. States he had an operation on his left leg with stents placed and swelling has gotten worse since.  3+ pitting edema of the left ankle to left knee  HPI:  Howard Jackson is a 77 y.o. male with medical history significant of history of CAD, essential hypertension, hyperlipidemia, CVA with left-sided hemiparesis, PAD status post angioplasty/stenting 04/24/2023, chronic diastolic heart failure and BPH presented to emergency department with complaining of left-sided leg edema up to the which has been getting worse.  He also complaining about left-sided hip pain, worse to touch and movement of the leg.  He took some pain medication without any improvement.  Reported history of left hip replacement 2019. Patient reported that left-sided hip pain 7-8 out of 10 intensity.  Patient denies any fall.  He is bedbound due to history of CVA.  Patient denies right-sided hip joint pain.  Denies any fever, chill and back pain.  He is endorsing left-sided lower extremity swelling, pain at the tip of the second toe.  Patient has total 5 stent placed in the left lower leg behind knee and ankle on 04/24/2023 with [facility] in [place] currently taking baby aspirin and Plavix daily.   ED Course:  I presentation to ED patient is tachycardic 119, otherwise hemodynamically stable. BMP showing low sodium 134, low chloride 94, low bicarb 20, elevated BUN 31 and elevated anion gap 20. CBC showing leukocytosis 19.7, hemoglobin 14, hematocrit 42, MCV 102 and platelet count 158. Pending C-reactive protein, elevated ESR.  CK WNL.   x-ray left hip no acute abnormality. CT  left foot and CT tibia-fibula and CT left femur finding following:  Marked soft tissue swelling about the calf extending superiorly into the thigh and inferiorly into the dorsum of the foot. Findings are nonspecific but correlation for cellulitis is recommended. No organized fluid collection or abscess. No soft tissue gas. onspecific edema within the posterior compartment musculature in the thigh and calf as well as the plantar muscles of the foot. Query myositis. No abscess.  ED physician discussed case with vascular surgeon Dr. Randie Heinz.  Dr. Randie Heinz reviewed the lab and imaging-they recommend: He stated that the swelling and erythema at the ankle and foot is most likely a side effect of having not able to ambulate after the procedure.  He does not think that the pain that patient is having today as a result of the recent vascular intervention.    Due to left-sided hip joint pain there is concern for left-sided hip septic arthritis and MRI of the left hip has been ordered however ED physician reported that radiology is not available to read the MRI before 8 AM.    Also antibiotic has not started yet waiting for the MRI, if MRI shows septic joint arthritis need to consult orthopedics for joint washout and need to send joint aspiration for cell count, culture and sensitivity that is why antibiotic has not been started in the ED.   Hospitalist has been contacted for further evaluation and management of possible left-sided hip joint septic arthritis.  In the ED patient has been treated with morphine 2 mg and Procardia XL 60 mg.  Significant labs in the ED: Lab Orders         Culture, blood (Routine X 2) w Reflex to ID Panel         Basic metabolic panel         CBC with Differential         Basic metabolic panel         CK         Sedimentation rate         C-reactive protein         CBC         APTT         Comprehensive metabolic panel         Protime-INR         Magnesium       Review of  Systems:  Review of Systems  Constitutional:  Negative for chills, fever, malaise/fatigue and weight loss.  Respiratory:  Negative for cough, sputum production and shortness of breath.   Cardiovascular:  Negative for chest pain and palpitations.  Gastrointestinal:  Negative for heartburn, nausea and vomiting.  Genitourinary:  Negative for dysuria, frequency and urgency.  Musculoskeletal:  Negative for back pain, joint pain, myalgias and neck pain.       Left-sided lower extremity and left-sided hip pain  Neurological:  Negative for dizziness and headaches.  Endo/Heme/Allergies:  Does not bruise/bleed easily.  Psychiatric/Behavioral:  The patient is not nervous/anxious.     Past Medical History:  Diagnosis Date   Adrenal gland disorder (HCC)    tumor present - no change-  no recent increase     Arthritis    Benign pheochromocytoma of right adrenal gland 06/23/2014   Never had histological diagnosis, but reportedly initially diagnosed in 1989   Bunion    left foot   Complication of anesthesia    "died on the table during cervical fusion" '89 - believed to be d/t "too much medication" to treat HTN and was later dx with pheochromocytoma   Coronary artery disease    cleared for surg. by Dr. Allyson Sabal - [facility]   H/O hiatal hernia    Hammer toe    Herniated lumbar intervertebral disc    History of anxiety    Hyperlipidemia    Hypertension    Neurogenic bladder    Neuromuscular disorder (HCC)    carpal tunnel problem, even after surgery release    Peripheral arterial disease (HCC)    Primary aldosteronism (HCC)    Stroke (HCC)    in [place]- 1999, L sided weakness, wears a brace on L leg & uses cane    Past Surgical History:  Procedure Laterality Date   ABDOMINAL AORTOGRAM W/LOWER EXTREMITY N/A 04/23/2023   Procedure: ABDOMINAL AORTOGRAM W/LOWER EXTREMITY;  Surgeon: Cephus Shelling, MD;  Location: [facility] INVASIVE [facility];  Service: Cardiovascular;  Laterality: N/A;   ANTERIOR APPROACH HEMI  HIP ARTHROPLASTY Left 04/15/2018   Procedure: ANTERIOR APPROACH HEMI HIP ARTHROPLASTY;  Surgeon: Samson Frederic, MD;  Location: [facility] OR;  Service: Orthopedics;  Laterality: Left;   CARPAL TUNNEL RELEASE     bilateral    CERVICAL FUSION     CORONARY ANGIOPLASTY WITH STENT PLACEMENT     pt reports having 5 stents in his heart-2011   HERNIA REPAIR     INGUINAL HERNIA REPAIR Left 11/18/2012   Procedure: HERNIA REPAIR INGUINAL ADULT;  Surgeon: Shelly Rubenstein, MD;  Location: [facility] OR;  Service: General;  Laterality: Left;   INSERTION OF  MESH Left 11/18/2012   Procedure: INSERTION OF MESH;  Surgeon: Shelly Rubenstein, MD;  Location: [facility] OR;  Service: General;  Laterality: Left;   IR KYPHO LUMBAR INC FX REDUCE BONE BX UNI/BIL CANNULATION INC/IMAGING  02/20/2023   PERIPHERAL VASCULAR INTERVENTION  04/23/2023   Procedure: PERIPHERAL VASCULAR INTERVENTION;  Surgeon: Cephus Shelling, MD;  Location: [facility]VAS[facility];  Service: Cardiovascular;;     reports that he has never smoked. He has never used smokeless tobacco. He reports current alcohol use. He reports that he does not use drugs.  Allergies  Allergen Reactions   Amitriptyline Hcl Hypertension   Other Shortness Of Breath and Other (See Comments)    SKIN is sensitive- Guards for the underwear MUST be removed immediately after using  Anesthesia- "They almost could not get me back" (HAS TO TAKE SPIRONOLACTONE NOW)   Losartan Potassium Other (See Comments)    Hypotension   Papaya Derivatives Itching and Other (See Comments)    Itching of the throat   Sulfa Antibiotics Other (See Comments)    Constipation     Family History  Problem Relation Age of Onset   CVA Father    Diabetes Father    Alzheimer's disease Father    Hypertension Sister    Hypertension Brother    CVA Paternal Grandmother    Hypertension Brother    Hypertension Mother     Prior to Admission medications   Medication Sig Start Date End Date Taking? Authorizing  Provider  acetaminophen (TYLENOL) 500 MG tablet Take 500 mg by mouth every 6 (six) hours as needed for moderate pain or mild pain.    [provider]  Albuterol Sulfate (PROAIR RESPICLICK) 108 (90 Base) MCG/ACT AEPB Inhale 2 puffs into the lungs every 6 (six) hours as needed (Wheezing/SOB).    [provider]  aspirin EC 81 MG tablet Take 1 tablet (81 mg total) by mouth daily. Swallow whole. 04/23/23 04/22/24  Cephus Shelling, MD  bisacodyl (DULCOLAX) 10 MG suppository Place 1 suppository (10 mg total) rectally daily as needed for moderate constipation. 02/23/23   Rolly Salter, MD  Cholecalciferol (VITAMIN D3) 50 MCG (2000 UT) TABS Take 2,000 Units by mouth every other day.    [provider]  clopidogrel (PLAVIX) 75 MG tablet Take 1 tablet (75 mg total) by mouth daily. 04/23/23   Cephus Shelling, MD  Coenzyme Q10 (CO Q 10) 100 MG CAPS Take 100 mg by mouth daily.    [provider]  Dextromethorphan-guaiFENesin (DELSYM CGH/CHEST CONG DM CHILD) 5-100 MG/5ML LIQD Take 10 mLs by mouth every 4 (four) hours as needed (Cough).    [provider]  diclofenac Sodium (VOLTAREN) 1 % GEL Apply 4 g topically 4 (four) times daily as needed (Pain).    [provider]  finasteride (PROSCAR) 5 MG tablet Take 5 mg by mouth daily. 01/19/18   [provider]  Fluticasone Furoate (ARNUITY ELLIPTA) 200 MCG/ACT AEPB Inhale 1 puff into the lungs daily. Rinse mouth after use    [provider]  gabapentin (NEURONTIN) 100 MG capsule Take 100 mg by mouth daily as needed (pain). Patient not taking: Reported on 05/26/2023    [provider]  labetalol (NORMODYNE) 200 MG tablet Take 1 tablet (200 mg total) by mouth 2 (two) times daily. Patient taking differently: Take 300 mg by mouth 2 (two) times daily. 02/23/23   Rolly Salter, MD  lactobacillus acidophilus (BACID) TABS tablet Take 2 tablets  by mouth 2 (two) times daily. 04/20/18    Briant Cedar, MD  Lido-PE-Glycerin-Petrolatum (PREPARATION H RAPID RELIEF) 5-0.25-14.4-15 % CREA Place 1 application  rectally 4 (four) times daily as needed (Hemorrhoid irritation).    [provider]  magnesium hydroxide (MILK OF MAGNESIA) 400 MG/5ML suspension Take 30 mLs by mouth daily as needed for mild constipation.    [provider]  Multiple Vitamins-Minerals (MULTIVITAMIN WITH MINERALS) tablet Take 1 tablet by mouth daily. Centrum silver ultra men's 300-60-600-300 mcg    [provider]  NIFEdipine (ADALAT CC) 30 MG 24 hr tablet Take 30 mg by mouth in the morning.    [provider]  NIFEdipine (ADALAT CC) 60 MG 24 hr tablet Take 1 tablet (60 mg total) by mouth every evening. 04/29/22   Croitoru, Mihai, MD  Nutritional Supplements (,FEEDING SUPPLEMENT, PROSOURCE PLUS) liquid Take 30 mLs by mouth 2 (two) times daily between meals. 02/23/23   Rolly Salter, MD  Nystatin (GERHARDT'S BUTT CREAM) CREA Apply 1 Application topically continuous as needed for irritation. Patient not taking: Reported on 04/14/2023 02/23/23   Rolly Salter, MD  pantoprazole (PROTONIX) 40 MG tablet Take 1 tablet (40 mg total) by mouth daily. Take along with prednisone 12/19/22   Meredeth Ide, MD  polyethylene glycol (MIRALAX / GLYCOLAX) 17 g packet Take 17 g by mouth daily. 02/24/23   Rolly Salter, MD  predniSONE (DELTASONE) 20 MG tablet Take 60 mg by mouth daily with breakfast.    [provider]  protein supplement (PROSOURCE NO CARB) LIQD Take 30 mLs by mouth 2 (two) times daily.    [provider]  rosuvastatin (CRESTOR) 20 MG tablet TOME UNA TABLETA TODOS LOS DIAS Patient taking differently: Take 20 mg by mouth daily. 01/28/21   Croitoru, Mihai, MD  senna-docusate (SENOKOT-S) 8.6-50 MG tablet Take 3 tablets by mouth every evening. Patient taking differently: Take 2 tablets by mouth at bedtime. 04/20/18   Briant Cedar, MD  spironolactone  (ALDACTONE) 50 MG tablet Take 1 tablet (50 mg total) by mouth daily. 02/23/23   Rolly Salter, MD  tamsulosin (FLOMAX) 0.4 MG CAPS capsule Take 1 capsule (0.4 mg total) by mouth daily. Patient taking differently: Take 0.4 mg by mouth in the morning and at bedtime. 01/09/17   Massie Maroon, FNP  tiZANidine (ZANAFLEX) 2 MG tablet Take 1 tablet (2 mg total) by mouth daily as needed for muscle spasms. 02/23/23   Rolly Salter, MD  traMADol HCl 25 MG TABS Take 1-2 tablets by mouth daily as needed (up to 3 days as needed for pain). Patient not taking: Reported on 05/26/2023    [provider]  triamcinolone ointment (KENALOG) 0.1 % Apply 1 Application topically 2 (two) times daily as needed (for dryness).    [provider]  Zinc Oxide 40 % PSTE Apply 1 Application topically as needed for irritation (Skin protection).    [provider]     Physical Exam: Vitals:   06/10/23 0228 06/10/23 0402 06/10/23 0426 06/10/23 0520  BP: 101/60 108/65  123/69  Pulse: (!) 106 (!) 105    Resp: 17 17    Temp: (!) 97.3 F (36.3 C) 98.2 F (36.8 C)    TempSrc:  Oral    SpO2: 96% 91%    Weight:   65 kg   Height:   5\' 7"  (1.702 m)     Physical Exam Constitutional:      Appearance: He  is not ill-appearing.  HENT:     Mouth/Throat:     Mouth: Mucous membranes are moist.  Eyes:     Conjunctiva/sclera: Conjunctivae normal.  Cardiovascular:     Rate and Rhythm: Normal rate and regular rhythm.     Pulses: Normal pulses.     Heart sounds: Normal heart sounds.  Pulmonary:     Effort: Pulmonary effort is normal.     Breath sounds: Normal breath sounds.  Abdominal:     General: Bowel sounds are normal.  Musculoskeletal:        General: Swelling and tenderness present.     Cervical back: Normal range of motion.     Right lower leg: No edema.     Left lower leg: Edema present.     Comments: Left-sided lower extremity pitting edema 1+. Left-sided hip joint tenderness on  palpation and warmth to touch.   Neurological:     Mental Status: He is alert and oriented to person, place, and time.      Labs on Admission: I have personally reviewed following labs and imaging studies  CBC: Recent Labs  Lab 06/09/23 1230  WBC 19.7*  NEUTROABS 16.3*  HGB 14.0  HCT 42.0  MCV 102.7*  PLT 158   Basic Metabolic Panel: Recent Labs  Lab 06/09/23 1230 06/09/23 1700  NA 134* 132*  K 4.5 4.0  CL 91* 97*  CO2 20* 22  GLUCOSE 163* 145*  BUN 31* 28*  CREATININE 1.00 0.89  CALCIUM 9.5 8.9   GFR: Estimated Creatinine Clearance: 61.9 mL/min (by C-G formula based on SCr of 0.89 mg/dL). Liver Function Tests: No results for input(s): "AST", "ALT", "ALKPHOS", "BILITOT", "PROT", "ALBUMIN" in the last 168 hours. No results for input(s): "LIPASE", "AMYLASE" in the last 168 hours. No results for input(s): "AMMONIA" in the last 168 hours. Coagulation Profile: No results for input(s): "INR", "PROTIME" in the last 168 hours. Cardiac Enzymes: Recent Labs  Lab 06/09/23 2130  CKTOTAL 106   BNP (last 3 results) No results for input(s): "BNP" in the last 8760 hours. HbA1C: No results for input(s): "HGBA1C" in the last 72 hours. CBG: No results for input(s): "GLUCAP" in the last 168 hours. Lipid Profile: No results for input(s): "CHOL", "HDL", "LDLCALC", "TRIG", "CHOLHDL", "LDLDIRECT" in the last 72 hours. Thyroid Function Tests: No results for input(s): "TSH", "T4TOTAL", "FREET4", "T3FREE", "THYROIDAB" in the last 72 hours. Anemia Panel: No results for input(s): "VITAMINB12", "FOLATE", "FERRITIN", "TIBC", "IRON", "RETICCTPCT" in the last 72 hours. Urine analysis:    Component Value Date/Time   COLORURINE YELLOW 04/21/2023 1134   APPEARANCEUR CLEAR 04/21/2023 1134   LABSPEC 1.025 04/21/2023 1134   PHURINE 6.0 04/21/2023 1134   GLUCOSEU NEGATIVE 04/21/2023 1134   HGBUR NEGATIVE 04/21/2023 1134   BILIRUBINUR NEGATIVE 04/21/2023 1134   KETONESUR NEGATIVE  04/21/2023 1134   PROTEINUR NEGATIVE 04/21/2023 1134   UROBILINOGEN 0.2 11/25/2016 1448   NITRITE NEGATIVE 04/21/2023 1134   LEUKOCYTESUR NEGATIVE 04/21/2023 1134    Radiological Exams on Admission: I have personally reviewed images CT FOOT LEFT W CONTRAST  Result Date: 06/09/2023 CLINICAL DATA:  Left leg swelling and severe pain. Recent hip replacement. EXAM: CT OF THE LOWER LEFT EXTREMITY WITH CONTRAST TECHNIQUE: Multidetector CT imaging of the lower left extremity was performed according to the standard protocol following intravenous contrast administration. RADIATION DOSE REDUCTION: This exam was performed according to the departmental dose-optimization program which includes automated exposure control, adjustment of the mA and/or kV according to patient size  and/or use of iterative reconstruction technique. CONTRAST:  OMNIPAQUE IOHEXOL 300 MG/ML  SOLN COMPARISON:  None Available. FINDINGS: Bones/Joint/Cartilage Left THA. No acute fracture or dislocation. No knee joint effusion. Ligaments Suboptimally assessed by CT. Muscles and Tendons There is edema within the posterior compartment musculature in the thigh calf. Additional edema within the plantar muscles of the foot. No organized fluid collection or abscess. The muscles and tendons are grossly intact. Soft tissues Marked soft tissue swelling about the calf. This extends superiorly into the thigh and inferiorly into the dorsum of the foot. No organized fluid collection or abscess. No soft tissue gas. Vascular stent within the superficial femoral artery, popliteal artery, and tibioperoneal trunk. IMPRESSION: 1. Marked soft tissue swelling about the calf extending superiorly into the thigh and inferiorly into the dorsum of the foot. Findings are nonspecific but correlation for cellulitis is recommended. No organized fluid collection or abscess. No soft tissue gas. 2. Nonspecific edema within the posterior compartment musculature in the thigh and  calf as well as the plantar muscles of the foot. Query myositis. No abscess. Electronically Signed   By: Minerva Fester M.D.   On: 06/09/2023 19:29   CT FEMUR LEFT W CONTRAST  Result Date: 06/09/2023 CLINICAL DATA:  Left leg swelling and severe pain. Recent hip replacement. EXAM: CT OF THE LOWER LEFT EXTREMITY WITH CONTRAST TECHNIQUE: Multidetector CT imaging of the lower left extremity was performed according to the standard protocol following intravenous contrast administration. RADIATION DOSE REDUCTION: This exam was performed according to the departmental dose-optimization program which includes automated exposure control, adjustment of the mA and/or kV according to patient size and/or use of iterative reconstruction technique. CONTRAST:  OMNIPAQUE IOHEXOL 300 MG/ML  SOLN COMPARISON:  None Available. FINDINGS: Bones/Joint/Cartilage Left THA. No acute fracture or dislocation. No knee joint effusion. Ligaments Suboptimally assessed by CT. Muscles and Tendons There is edema within the posterior compartment musculature in the thigh calf. Additional edema within the plantar muscles of the foot. No organized fluid collection or abscess. The muscles and tendons are grossly intact. Soft tissues Marked soft tissue swelling about the calf. This extends superiorly into the thigh and inferiorly into the dorsum of the foot. No organized fluid collection or abscess. No soft tissue gas. Vascular stent within the superficial femoral artery, popliteal artery, and tibioperoneal trunk. IMPRESSION: 1. Marked soft tissue swelling about the calf extending superiorly into the thigh and inferiorly into the dorsum of the foot. Findings are nonspecific but correlation for cellulitis is recommended. No organized fluid collection or abscess. No soft tissue gas. 2. Nonspecific edema within the posterior compartment musculature in the thigh and calf as well as the plantar muscles of the foot. Query myositis. No abscess.  Electronically Signed   By: Minerva Fester M.D.   On: 06/09/2023 19:29   CT TIBIA FIBULA LEFT W CONTRAST  Result Date: 06/09/2023 CLINICAL DATA:  Left leg swelling and severe pain. Recent hip replacement. EXAM: CT OF THE LOWER LEFT EXTREMITY WITH CONTRAST TECHNIQUE: Multidetector CT imaging of the lower left extremity was performed according to the standard protocol following intravenous contrast administration. RADIATION DOSE REDUCTION: This exam was performed according to the departmental dose-optimization program which includes automated exposure control, adjustment of the mA and/or kV according to patient size and/or use of iterative reconstruction technique. CONTRAST:  OMNIPAQUE IOHEXOL 300 MG/ML  SOLN COMPARISON:  None Available. FINDINGS: Bones/Joint/Cartilage Left THA. No acute fracture or dislocation. No knee joint effusion. Ligaments Suboptimally assessed by CT.  Muscles and Tendons There is edema within the posterior compartment musculature in the thigh calf. Additional edema within the plantar muscles of the foot. No organized fluid collection or abscess. The muscles and tendons are grossly intact. Soft tissues Marked soft tissue swelling about the calf. This extends superiorly into the thigh and inferiorly into the dorsum of the foot. No organized fluid collection or abscess. No soft tissue gas. Vascular stent within the superficial femoral artery, popliteal artery, and tibioperoneal trunk. IMPRESSION: 1. Marked soft tissue swelling about the calf extending superiorly into the thigh and inferiorly into the dorsum of the foot. Findings are nonspecific but correlation for cellulitis is recommended. No organized fluid collection or abscess. No soft tissue gas. 2. Nonspecific edema within the posterior compartment musculature in the thigh and calf as well as the plantar muscles of the foot. Query myositis. No abscess. Electronically Signed   By: Minerva Fester M.D.   On: 06/09/2023 19:29   DG  Hip Unilat W or Wo Pelvis 2-3 Views Left  Result Date: 06/09/2023 CLINICAL DATA:  Left leg swelling. EXAM: DG HIP (WITH OR WITHOUT PELVIS) 2-3V LEFT COMPARISON:  February 12, 2023. FINDINGS: Status post left hip arthroplasty. No acute fracture or dislocation is noted. IMPRESSION: No acute abnormality seen. Electronically Signed   By: Lupita Raider M.D.   On: 06/09/2023 16:10    EKG: My personal interpretation of EKG shows: Normal sinus rhythm with normal sinus arrhythmia heart rate 97.  Medium voltage criteria for left ventricular hypertrophy.     Assessment/Plan: Principal Problem:   Pain of left hip joint Active Problems:   Swelling of left lower extremity   Cellulitis and abscess of left lower extremity   Left lower extremity PVD status post stent placement x 5 04/24/2023   History of cerebrovascular accident (CVA) with residual deficit   BPH (benign prostatic hyperplasia)   Hyperlipidemia   Chronic diastolic heart failure (HCC)   Chronic hyponatremia   History of CAD (coronary artery disease)    Assessment and Plan: Left-sided hip joint pain Concern for left hip septic arthritis > Patient presenting with complaining of left-sided hip joint pain with movement.  Patient denies any fall.  At the baseline he is bedbound due to left-sided hemiparesis from CVA. - Patient having this pain for last few days ongoing.  Physical exam showed left-sided hip joint tenderness on palpation and warmth to touch. - I have sent patient to ED patient is hemodynamically stable except borderline tachycardic 111. -Labs showed leukocytosis 19.7.  BMP mild hyponatremia 138.  Normal CK.  Elevated ESR and pending CRP. -In the ED MRI has been ordered.  MRI Cannot be read by the radiologist before 8 AM. -CT left femur  Nonspecific edema within the posterior compartment musculature in the thigh and calf as well as the plantar muscles of the foot. Query myositis. No abscess.  -If MRI shows left hip joint septic  arthritis need to consult orthopedics for I&D. - Given patient is hemodynamically stable holding IV antibiotic until MRI result and possible orthopedics left hip joint I&D with need to send study for cell count, Gram stain and culture. -Obtaining blood cultures. Addendum: -Concern for left lower extremity cellulitis.  Starting IV ceftriaxone 1 g daily.   Left lower extremity cellulitis Left-sided leg swelling Left lower extremity PVD status post stent placement 04/24/2023 -Patient reported left-sided leg swelling.  He has history of peripheral vascular disease underwent left lower extremity stent placement 04/24/2023. - Patient reported progressive  swelling of the left lower extremity - CT scan left foot and tibia-fibula finding:  Marked soft tissue swelling about the calf extending superiorly into the thigh and inferiorly into the dorsum of the foot. Findings are nonspecific but correlation for cellulitis is recommended. No organized fluid collection or abscess. No soft tissue gas. -ED physician discussed case with vascular surgeon Dr. Randie Heinz, stated that that the swelling and erythema at the ankle and foot is most likely a side effect of having not able to ambulate after the procedure.  He does not think that the pain that patient is having today as a result of the recent vascular intervention.   -Physical exam showing no sign of erythema but there is significant tenderness to the calf muscle.  There is low probability that patient has developing left lower extremity cellulitis.  But given significant pain and tenderness to touch which also meets the criteria for cellulitis and patient has significant leukocytosis as well - Starting IV ceftriaxone 1 g daily for the management of cellulitis. -Continue Tylenol as needed - Resumed Zanaflex as needed. -Continue aspirin, statin and Plavix for the management of peripheral vascular disease.   History of CVA with left-sided hemiplegia -Continue  aspirin 81 mg daily, Plavix 75 mg daily and Crestor 20 mg daily   Chronic hyponatremia -Serum sodium 134. - Continue to monitor.  History of diastolic heart failure Essential hypertension -In the ED patient received Procardia XL 60 mg and blood pressure started trending down now.  Holding further blood pressure regimen.  Also patient takes labetalol 300 mg twice daily, spironolactone 50 mg daily. -Once blood pressure will be more stabilized we will need to resume blood pressure regimen.   History of CAD -Continue aspirin and statin.   Reactive airway disease - Continue Xopenex as needed for wheezing or shortness of breath.  Nonsustained V. tach - Bedside nurse reported around 5 AM  telemetry showed nonsustained V. tach heart rate 150.  Due to soft blood pressure cannot resume labetalol.  Patient usually takes labetalol 300 mg twice daily. - Ordered Lopressor 2.5 mg as needed for for tachycardia.  If patient develops sustained medical tachycardia need to give amiodarone bolus. -Continue cardiac monitoring.  BPH - Continue tamsulosin 0.4 mg at bedtime  DVT prophylaxis:  SCDs.  Deferring pharmacological prophylaxis as there is possibility that patient will undergo left hip joint I&D in the morning Code Status:  Full Code Diet: Heart healthy diet Family Communication: Updated patient's daughter over phone. Disposition Plan:   Pending MRI of the foot results and based on the finding need to start IV antibiotic regimen as well as need to reach out to orthopedic surgeon. Consults: None at this time Admission status:   Inpatient, Telemetry bed  Severity of Illness: The appropriate patient status for this patient is INPATIENT. Inpatient status is judged to be reasonable and necessary in order to provide the required intensity of service to ensure the patient's safety. The patient's presenting symptoms, physical exam findings, and initial radiographic and laboratory data in the context of  their chronic comorbidities is felt to place them at high risk for further clinical deterioration. Furthermore, it is not anticipated that the patient will be medically stable for discharge from the hospital within 2 midnights of admission.   * I certify that at the point of admission it is my clinical judgment that the patient will require inpatient hospital care spanning beyond 2 midnights from the point of admission due to high intensity of service,  high risk for further deterioration and high frequency of surveillance required.Marland Kitchen    Tereasa Coop, MD [facility]  How to contact the [facility] Attending or Consulting provider 7A - 7P or covering provider during after hours 7P -7A, for this patient.  Check the care team in [facility] and look for a) attending/consulting TRH provider listed and b) the [facility] team listed Log into [email or web address] and use 's universal password to access. If you do not have the password, please contact the hospital operator. Locate the [facility] provider you are looking for under [facility] and page to a number that you can be directly reached. If you still have difficulty reaching the provider, please page the [facility] (Director on Call) for the Hospitalists listed on amion for assistance.  06/10/2023, 5:34 AM

## 2023-06-10 NOTE — Progress Notes (Signed)
Initial Nutrition Assessment  DOCUMENTATION CODES:   Non-severe (moderate) malnutrition in context of chronic illness  INTERVENTION:   -Ensure MAX Protein po BID, each supplement provides 150 kcal and 30 grams of protein   -Multivitamin with minerals daily  NUTRITION DIAGNOSIS:   Moderate Malnutrition related to chronic illness as evidenced by mild fat depletion, moderate muscle depletion.  GOAL:   Patient will meet greater than or equal to 90% of their needs  MONITOR:   PO intake, Supplement acceptance, Labs, Weight trends, I & O's  REASON FOR ASSESSMENT:   Malnutrition Screening Tool    ASSESSMENT:   77 y.o. male with medical history significant of history of CAD, essential hypertension, hyperlipidemia, CVA with left-sided hemiparesis, PAD status post angioplasty/stenting 04/24/2023, chronic diastolic heart failure and BPH presented to emergency department with complaining of left-sided leg edema up to the which has been getting worse. He is bedbound due to history of CVA.  Patient in room, reports he is eating. Has coffee but requesting ice to cool it down.  Pt reports he was told his A1c was high and he needs to keep it controlled to maximize healing and to have surgery.  Pt reports history of diabetes. Last HgbA1c is from 2017. Pt agreeable to low carb protein shakes, will order Ensure Max.  Per weight records, no recent weight loss but may be masked by fluid accumulation.   Medications: IV Mg sulfate  Labs reviewed: Low Na Low Mg   NUTRITION - FOCUSED PHYSICAL EXAM:  Flowsheet Row Most Recent Value  Orbital Region Mild depletion  Upper Arm Region Moderate depletion  Thoracic and Lumbar Region Mild depletion  Buccal Region No depletion  Temple Region Moderate depletion  Clavicle Bone Region Moderate depletion  Clavicle and Acromion Bone Region Moderate depletion  Scapular Bone Region Mild depletion  Dorsal Hand Mild depletion  Patellar Region Unable to  assess  Anterior Thigh Region Unable to assess  Posterior Calf Region Unable to assess  Edema (RD Assessment) Severe  [BLEs]  Hair Reviewed  Eyes Reviewed  Mouth Reviewed  Skin Reviewed       Diet Order:   Diet Order             Diet Heart Room service appropriate? Yes; Fluid consistency: Thin  Diet effective now                   EDUCATION NEEDS:   No education needs have been identified at this time  Skin:  Skin Assessment: Reviewed RN Assessment  Last BM:  11/26  Height:   Ht Readings from Last 1 Encounters:  06/10/23 5\' 7"  (1.702 m)    Weight:   Wt Readings from Last 1 Encounters:  06/10/23 65 kg    BMI:  Body mass index is 22.44 kg/m.  Estimated Nutritional Needs:   Kcal:  1650-1850  Protein:  75-90g  Fluid:  1.8L/day   Tilda Franco, MS, RD, LDN Inpatient Clinical Dietitian Contact information available via Amion

## 2023-06-10 NOTE — Evaluation (Signed)
Occupational Therapy Evaluation Patient Details Name: Howard Jackson MRN: [id number] DOB: 01-06-1946 Today's Date: 06/10/2023   History of Present Illness Pt is a 77 year old man admitted on 11/26 with severe L LE pain due to myositis. PMH: PAD with stent/angioplasty 10/15 (d/c to SNF where he remained until 11/22), CAD, HTN, HLD, CVA with L hemiplegia.   Clinical Impression   Pt is a difficult historian. He lives alone. Reports he could transfer to his w/c using a bar on his bed, propel his power w/c to the bathroom and transfer with a grab bar to the toilet at his baseline. He has needed assistance for bathing, dressing and IADLs long term, difficult for pt to give timeline. Pt states he recently became a client of [facility] and anticipated more in-home services than was provided after he discharged from [facility] earlier this month. Pt currently with significant L LE pain with attempt to sit EOB, unable to tolerate. He is able to self feed and groom at bed level with min assist, he is otherwise dependent. Patient will benefit from continued inpatient follow up therapy, <3 hours/day       If plan is discharge home, recommend the following: Two people to help with walking and/or transfers;Two people to help with bathing/dressing/bathroom;Assistance with cooking/housework;Assistance with feeding;Assist for transportation;Help with stairs or ramp for entrance    Functional Status Assessment  Patient has had a recent decline in their functional status and/or demonstrates limited ability to make significant improvements in function in a reasonable and predictable amount of time  Equipment Recommendations  Hoyer lift    Recommendations for Other Services       Precautions / Restrictions Precautions Precautions: Fall Precaution Comments: L hemiplegia Restrictions Weight Bearing Restrictions: No      Mobility Bed Mobility Overal bed mobility: Needs Assistance Bed Mobility: Supine  to Sit     Supine to sit: Max assist     General bed mobility comments: began to assist pt to EOB, yelling out in pain due in L LE, unable to tolerate, returned to supine    Transfers                   General transfer comment: unable      Balance                                           ADL either performed or assessed with clinical judgement   ADL Overall ADL's : Needs assistance/impaired Eating/Feeding: Set up;Bed level   Grooming: Wash/dry hands;Bed level;Set up                                 General ADL Comments: pt is dependent in bathing, dressing and IADLs prior to admission, could get to the bathroom via w/c prior to his vascular surgery in October     Vision Patient Visual Report: No change from baseline       Perception         Praxis         Pertinent Vitals/Pain Pain Assessment Pain Assessment: Faces Pain Score: 10-Worst pain ever Pain Location: L LE Pain Descriptors / Indicators: Grimacing, Guarding, Moaning, Constant Pain Intervention(s): Monitored during session, Premedicated before session, Repositioned     Extremity/Trunk Assessment Upper Extremity Assessment Upper Extremity Assessment:  Right hand dominant;LUE deficits/detail LUE Deficits / Details: dense L hemiplegia with increased flexor tone, has a splint at home LUE Coordination: decreased fine motor;decreased gross motor   Lower Extremity Assessment Lower Extremity Assessment: Defer to PT evaluation   Cervical / Trunk Assessment Cervical / Trunk Assessment: Other exceptions (weakness)   Communication Communication Communication: No apparent difficulties   Cognition Arousal: Alert Behavior During Therapy: WFL for tasks assessed/performed Overall Cognitive Status: No family/caregiver present to determine baseline cognitive functioning                                 General Comments: appears intact     General Comments        Exercises     Shoulder Instructions      Home Living Family/patient expects to be discharged to:: Private residence Living Arrangements: Alone Available Help at Discharge: Personal care attendant;Family;Available PRN/intermittently Type of Home: Apartment Home Access: Level entry     Home Layout: One level     Bathroom Shower/Tub: Tub/shower unit         Home Equipment: Wheelchair - power;Tub bench;Hospital bed   Additional Comments: grab bar next to his bed?      Prior Functioning/Environment Prior Level of Function : Needs assist             Mobility Comments: can pivot to his w/c with use of bars ADLs Comments: He required assist from his home aides for cleaning, meal prep, showering,dressing and changing his depends, but reports caregivers have been inconsistent since he started with [facility].        OT Problem List: Decreased strength;Decreased range of motion;Decreased activity tolerance;Decreased coordination;Impaired balance (sitting and/or standing);Impaired UE functional use;Pain;Impaired tone      OT Treatment/Interventions: Self-care/ADL training;Therapeutic activities;Balance training;Patient/family education    OT Goals(Current goals can be found in the care plan section) Acute Rehab OT Goals OT Goal Formulation: With patient Time For Goal Achievement: 06/24/23 Potential to Achieve Goals: Fair  OT Frequency: Min 1X/week    Co-evaluation              AM-PAC OT "6 Clicks" Daily Activity     Outcome Measure Help from another person eating meals?: A Little Help from another person taking care of personal grooming?: A Little Help from another person toileting, which includes using toliet, bedpan, or urinal?: Total Help from another person bathing (including washing, rinsing, drying)?: Total Help from another person to put on and taking off regular upper body clothing?: Total Help from another person to put on and taking off regular lower  body clothing?: Total 6 Click Score: 10   End of Session    Activity Tolerance: Patient limited by pain Patient left: in bed;with call bell/phone within reach;with bed alarm set  OT Visit Diagnosis: Pain;Muscle weakness (generalized) (M62.81);Hemiplegia and hemiparesis Hemiplegia - Right/Left: Left Hemiplegia - dominant/non-dominant: Non-Dominant Hemiplegia - caused by: Cerebral infarction                Time: 1520-1556 OT Time Calculation (min): 36 min Charges:  OT General Charges $OT Visit: 1 Visit OT Evaluation $OT Eval Moderate Complexity: 1 Mod OT Treatments $Therapeutic Activity: 8-22 mins  Berna Spare, OTR/L Acute Rehabilitation Services Office: [phone]   Evern Bio 06/10/2023, 4:18 PM

## 2023-06-10 NOTE — Progress Notes (Signed)
[facility] Progress Note Patient: Howard Jackson ZOX:[id number] DOB: Aug 01, 1945 DOA: 06/09/2023  DOS: the patient was seen and examined on 06/10/2023  Brief hospital course: Howard Jackson is a 77 y.o. male with medical history significant of history of CAD, essential hypertension, hyperlipidemia, CVA with left-sided hemiparesis, PAD status post angioplasty/stenting 04/24/2023, chronic diastolic heart failure and BPH presented to emergency department with complaining of left-sided leg edema up to the which has been getting worse.  Currently mated for cellulitis.  Assessment and Plan: Left-sided hip joint pain Left leg cellulitis.  Concern for septic arthritis or abscess ruled out. Presents with left leg pain. CT scan unremarkable. MRI negative for any joint issues. No fractures. At present we will initiate IV antibiotic and treat as cellulitis. Concern for gout cannot be ruled out. Patient already on prednisone which I will resume. Also give Toradol. For now scheduled Robaxin. Will monitor for improvement. PT OT consulted.   History of CVA with left-sided hemiplegia -Continue aspirin 81 mg daily, Plavix 75 mg daily and Crestor 20 mg daily   Chronic hyponatremia -Serum sodium 134. - Continue to monitor.  History of diastolic heart failure Essential hypertension -In the ED patient received Procardia XL 60 mg and blood pressure started trending down now.  Holding further blood pressure regimen.  Also patient takes labetalol 300 mg twice daily, spironolactone 50 mg daily. -Once blood pressure will be more stabilized we will need to resume blood pressure regimen.   History of CAD -Continue aspirin and statin.     Reactive airway disease - Continue Xopenex as needed for wheezing or shortness of breath.   Nonsustained V. tach - Bedside nurse reported around 5 AM  telemetry showed nonsustained V. tach heart rate 150.  Due to soft blood pressure cannot resume  labetalol.  Patient usually takes labetalol 300 mg twice daily. - Ordered Lopressor 2.5 mg as needed for for tachycardia.  If patient develops sustained medical tachycardia need to give amiodarone bolus. -Continue cardiac monitoring.   BPH - Continue tamsulosin 0.4 mg at bedtime  Subjective: Continues to have pain.  No nausea no vomiting no fever no chills.  Physical Exam: General: in moderate distress, No Rash Cardiovascular: S1 and S2 Present, No Murmur Respiratory: Good respiratory effort, Bilateral Air entry present. No Crackles, No wheezes Abdomen: Bowel Sound present, No tenderness Extremities: Left leg edema, left knee redness with warmth and tenderness Neuro: Alert and oriented x3, no new focal deficit  Data Reviewed: I have Reviewed nursing notes, Vitals, and Lab results. Since last encounter, pertinent lab results CBC and BMP   . I have ordered test including CBC and BMP  .   Disposition: Status is: Inpatient Remains inpatient appropriate because: I have antibiotics and further workup  enoxaparin (LOVENOX) injection 40 mg Start: 06/10/23 1400 SCDs Start: 06/10/23 0208 Place TED hose Start: 06/10/23 0208   Family Communication: No one at bedside Level of care: Telemetry   Vitals:   06/10/23 0426 06/10/23 0520 06/10/23 0957 06/10/23 1457  BP:  123/69  110/61  Pulse:    (!) 104  Resp:    18  Temp:    98.7 F (37.1 C)  TempSrc:    Oral  SpO2:   97% 93%  Weight: 65 kg     Height: 5\' 7"  (1.702 m)        Author: Lynden Oxford, MD 06/10/2023 7:42 PM  Please look on [email or web address] to find out who is on call.

## 2023-06-10 NOTE — Progress Notes (Signed)
Pt voiced concerns about his care at home that he had received from his home health agency prior to admission. He states that if he needs to be admitted to a SNF he would prefer to go back to [facility]. Writer contacted TOC to make them aware.

## 2023-06-10 NOTE — TOC Initial Note (Addendum)
Transition of Care [facility]) - Initial/Assessment Note    Patient Details  Name: Howard Jackson MRN: [id number] Date of Birth: Sep 19, 1945  Transition of Care [facility]) CM/SW Contact:    Howell Rucks, RN Phone Number: 06/10/2023, 10:35 AM  Clinical Narrative: Met with pt at bedside to introduce role of TOC/NCM and review for dc planning, per documentation pt is bed bound with motorized w/c for mobility, pt resides in w/c accessible apartment, has HHA care giver services, confirmed [facility] is his PCP and they provide w/c accessible  transportation to his appointments.  Pt c/o pain during initial assessment, requesting pain medication. NCM informed bedside nurse. NCM will outreach pt's dtr (Amalthea) to introduce role of TOC/NCM and complete initial assessment.   -10:38am NCM call to [facility] at [phone]) to introduce self to pt's CM, transferred to vm of [place], CM, vm left with NCM name and phone number for contact.      -10:45am Call received from [facility], CM with [facility], reports pt is bedbound with use of a  motorized w/c for functional mobility, reports railings placed in his home after recent short term rehab admission,  reports pt is able to transfer himself in and out of his w/c, att[facility], has a HHA that assist with AM and PM care, also reports pt has support from his family. NCM will continue to collaborate with [facility] for dc planning.    -10:50am Call to ptr's dtr (Amalthea) to introduce role of TOC/NCM and review for dc planning.  [place] reports pt recently discharged from short term rehab at [facility] on Friday 11/22 and was to resume his HHA services, family cared for the patient over the weekend as they were informed no HHA services would be provided until Tuesday, 11/26, reports HHA did not show up on 11/26, pt subsequently admitted to the hospital.NCM provided Amalthea with contact information for questions/concerns. TOC  will continue to follow.    -12:20pm PT eval pending, await recommendation.   Expected Discharge Plan: Home w Home Health Services     Patient Goals and CMS Choice Patient states their goals for this hospitalization and ongoing recovery are:: return home with support from family          Expected Discharge Plan and Services       Living arrangements for the past 2 months: Apartment                                      Prior Living Arrangements/Services Living arrangements for the past 2 months: Apartment Lives with:: Self Patient language and need for interpreter reviewed:: Yes Do you feel safe going back to the place where you live?: Yes      Need for Family Participation in Patient Care: Yes (Comment) Care giver support system in place?: Yes (comment)   Criminal Activity/Legal Involvement Pertinent to Current Situation/Hospitalization: No - Comment as needed  Activities of Daily Living   ADL Screening (condition at time of admission) Independently performs ADLs?: No Does the patient have a NEW difficulty with bathing/dressing/toileting/self-feeding that is expected to last >3 days?: No Does the patient have a NEW difficulty with getting in/out of bed, walking, or climbing stairs that is expected to last >3 days?: No Does the patient have a NEW difficulty with communication that is expected to last >3 days?:  No Is the patient deaf or have difficulty hearing?: No Does the patient have difficulty seeing, even when wearing glasses/contacts?: No Does the patient have difficulty concentrating, remembering, or making decisions?: No  Permission Sought/Granted                  Emotional Assessment Appearance:: Appears stated age Attitude/Demeanor/Rapport: Gracious Affect (typically observed): Unable to Assess Orientation: : Oriented to Self, Oriented to Place, Oriented to  Time Alcohol / Substance Use: Not Applicable Psych Involvement: No  (comment)  Admission diagnosis:  Pain of left hip joint [M25.552] Patient Active Problem List   Diagnosis Date Noted   Pain of left hip joint 06/10/2023   Swelling of left lower extremity 06/10/2023   History of CAD (coronary artery disease) 06/10/2023   Cellulitis and abscess of left lower extremity 06/10/2023   Critical limb ischemia of left lower extremity (HCC) 03/31/2023   Lumbar compression fracture (HCC) 02/12/2023   Malnutrition of moderate degree 12/18/2022   Temporal arteritis (HCC) 12/17/2022   Chronic diastolic heart failure (HCC) 12/17/2022   Chronic hyponatremia 12/17/2022   Tooth infection 12/17/2022   Hyperlipidemia 06/20/2018   Left spastic hemiparesis (HCC) 06/20/2018   Fall 04/14/2018   Fracture of femoral neck, left, closed (HCC) 04/14/2018   Closed displaced fracture of left femoral neck (HCC) 04/14/2018   Bunion    Hypertension due to endocrine disorder 03/07/2017   BPH (benign prostatic hyperplasia) 11/25/2016   Chronic bilateral thoracic back pain 08/20/2016   Mild intermittent asthma 06/18/2016   Wheezing 06/18/2016   Cough 06/18/2016   Left flank pain 04/18/2015   Benign pheochromocytoma of right adrenal gland 06/23/2014   Left lower extremity PVD status post stent placement x 5 04/24/2023 03/31/2013   HTN (hypertension) 03/31/2013   History of cerebrovascular accident (CVA) with residual deficit 03/31/2013   Coronary artery disease involving native coronary artery of native heart without angina pectoris 03/31/2013   Renal artery stenosis (HCC) 03/31/2013   Erectile dysfunction 03/31/2013   Dyslipidemia 03/31/2013   Carotid stenosis 03/31/2013   Left inguinal hernia 09/22/2012   PCP:  Inc, [facility] Pharm[facility]:   [id number] [place], [place] - [address] AT [facility] & [address] [place] [address] Phone: [phone] Fax: [phone]     Social  Determinants of Health (SDOH) Social History: SDOH Screenings   Food Insecurity: No Food Insecurity (06/10/2023)  Housing: Low Risk  (06/10/2023)  Transportation Needs: No Transportation Needs (06/10/2023)  Utilities: Not At Risk (06/10/2023)  Social Connections: Unknown (11/25/2021)   Received from [facility]  Tobacco Use: Low Risk  (06/10/2023)   SDOH Interventions:     Readmission Risk Interventions    06/10/2023   10:26 AM 02/16/2023    3:07 PM  Readmission Risk Prevention Plan  Transportation Screening Complete Complete  PCP or Specialist Appt within 5-7 Days Complete   PCP or Specialist Appt within 3-5 Days  Complete  Home Care Screening Complete   Medication Review (RN CM) Complete   HRI or Home Care Consult  Complete  Social Work Consult for Recovery Care Planning/Counseling  Complete  Palliative Care Screening  Not Applicable  Medication Review Oceanographer)  Complete

## 2023-06-10 NOTE — Plan of Care (Signed)
  Problem: Education: Goal: Knowledge of General Education information will improve Description: Including pain rating scale, medication(s)/side effects and non-pharmacologic comfort measures 06/10/2023 0648 by Orvil Feil, RN Outcome: Progressing 06/10/2023 0648 by Orvil Feil, RN Outcome: Progressing   Problem: Health Behavior/Discharge Planning: Goal: Ability to manage health-related needs will improve 06/10/2023 0648 by Orvil Feil, RN Outcome: Progressing 06/10/2023 0648 by Orvil Feil, RN Outcome: Progressing   Problem: Clinical Measurements: Goal: Ability to maintain clinical measurements within normal limits will improve 06/10/2023 0648 by Orvil Feil, RN Outcome: Progressing 06/10/2023 0648 by Orvil Feil, RN Outcome: Progressing

## 2023-06-10 NOTE — ED Notes (Signed)
Howard Jackson from [facility] stated imagines are ready.

## 2023-06-10 NOTE — ED Notes (Signed)
ED TO INPATIENT HANDOFF REPORT  ED Nurse Name and Phone #: Robbi Garter Name/Age/Gender Howard Jackson 77 y.o. male Room/Bed: WHALB/WHALB  Code Status   Code Status: Full Code  Home/SNF/Other Home Patient oriented to: self, place, time, and situation Is this baseline? Yes   Triage Complete: Triage complete  Chief Complaint Pain of left hip joint [M25.552]  Triage Note Pt from home, via ems, c/o left leg swelling. States he had an operation on his left leg with stents placed and swelling has gotten worse since.  3+ pitting edema left ankle to left knee    Allergies Allergies  Allergen Reactions   Amitriptyline Hcl Hypertension   Other Shortness Of Breath and Other (See Comments)    SKIN is sensitive- Guards for the underwear MUST be removed immediately after using  Anesthesia- "They almost could not get me back" (HAS TO TAKE SPIRONOLACTONE NOW)   Losartan Potassium Other (See Comments)    Hypotension   Papaya Derivatives Itching and Other (See Comments)    Itching of the throat   Sulfa Antibiotics Other (See Comments)    Constipation     Level of Care/Admitting Diagnosis ED Disposition     ED Disposition  Admit   Condition  --   Comment  Hospital Area: [facility] [[id number]]  Level of Care: Telemetry [5]  Admit to tele based on following criteria: Other see comments  Comments: Monitor for tachycardia, bradycardia or arrhythmia  May admit patient to [facility] or [facility] if equivalent level of care is available:: No  Covid Evaluation: Asymptomatic - no recent exposure (last 10 days) testing not required  Diagnosis: Pain of left hip joint [[id number]]  Admitting Physician: Tereasa Coop [[id number]]  Attending Physician: Tereasa Coop [[id number]]  Certification:: I certify this patient will need inpatient services for at least 2 midnights  Expected Medical Readiness: 06/15/2023          B Medical/Surgery History Past Medical  History:  Diagnosis Date   Adrenal gland disorder (HCC)    tumor present - no change-  no recent increase     Arthritis    Benign pheochromocytoma of right adrenal gland 06/23/2014   Never had histological diagnosis, but reportedly initially diagnosed in 1989   Bunion    left foot   Complication of anesthesia    "died on the table during cervical fusion" '89 - believed to be d/t "too much medication" to treat HTN and was later dx with pheochromocytoma   Coronary artery disease    cleared for surg. by Dr. Allyson Sabal - [facility]   H/O hiatal hernia    Hammer toe    Herniated lumbar intervertebral disc    History of anxiety    Hyperlipidemia    Hypertension    Neurogenic bladder    Neuromuscular disorder (HCC)    carpal tunnel problem, even after surgery release    Peripheral arterial disease (HCC)    Primary aldosteronism (HCC)    Stroke (HCC)    in [place]- 1999, L sided weakness, wears a brace on L leg & uses cane   Past Surgical History:  Procedure Laterality Date   ABDOMINAL AORTOGRAM W/LOWER EXTREMITY N/A 04/23/2023   Procedure: ABDOMINAL AORTOGRAM W/LOWER EXTREMITY;  Surgeon: Cephus Shelling, MD;  Location: [facility] INVASIVE CV LAB;  Service: Cardiovascular;  Laterality: N/A;   ANTERIOR APPROACH HEMI HIP ARTHROPLASTY Left 04/15/2018   Procedure: ANTERIOR APPROACH HEMI HIP ARTHROPLASTY;  Surgeon: Samson Frederic, MD;  Location:  [facility] OR;  Service: Orthopedics;  Laterality: Left;   CARPAL TUNNEL RELEASE     bilateral    CERVICAL FUSION     CORONARY ANGIOPLASTY WITH STENT PLACEMENT     pt reports having 5 stents in his heart-2011   HERNIA REPAIR     INGUINAL HERNIA REPAIR Left 11/18/2012   Procedure: HERNIA REPAIR INGUINAL ADULT;  Surgeon: Shelly Rubenstein, MD;  Location: [facility] OR;  Service: General;  Laterality: Left;   INSERTION OF MESH Left 11/18/2012   Procedure: INSERTION OF MESH;  Surgeon: Shelly Rubenstein, MD;  Location: [facility] OR;  Service: General;  Laterality: Left;   IR KYPHO LUMBAR INC  FX REDUCE BONE BX UNI/BIL CANNULATION INC/IMAGING  02/20/2023   PERIPHERAL VASCULAR INTERVENTION  04/23/2023   Procedure: PERIPHERAL VASCULAR INTERVENTION;  Surgeon: Cephus Shelling, MD;  Location: [facility];  Service: Cardiovascular;;     A IV Location/Drains/Wounds Patient Lines/Drains/Airways Status     Active Line/Drains/Airways     Name Placement date Placement time Site Days   Peripheral IV 06/09/23 20 G Right Antecubital 06/09/23  1240  Antecubital  1            Intake/Output Last 24 hours No intake or output data in the 24 hours ending 06/10/23 0214  Labs/Imaging Results for orders placed or performed during the hospital encounter of 06/09/23 (from the past 48 hour(s))  Basic metabolic panel     Status: Abnormal   Collection Time: 06/09/23 12:30 PM  Result Value Ref Range   Sodium 134 (L) 135 - 145 mmol/L    Comment: ELECTROLYTES REPEATED TO VERIFY   Potassium 4.5 3.5 - 5.1 mmol/L   Chloride 91 (L) 98 - 111 mmol/L    Comment: ELECTROLYTES REPEATED TO VERIFY   CO2 20 (L) 22 - 32 mmol/L    Comment: ELECTROLYTES REPEATED TO VERIFY   Glucose, Bld 163 (H) 70 - 99 mg/dL    Comment: Glucose reference range applies only to samples taken after fasting for at least 8 hours.   BUN 31 (H) 8 - 23 mg/dL   Creatinine, Ser 1.61 0.61 - 1.24 mg/dL   Calcium 9.5 8.9 - 09.6 mg/dL   GFR, Estimated >04 >54 mL/min    Comment: (NOTE) Calculated using the CKD-EPI Creatinine Equation (2021)    Anion gap >20 (H) 5 - 15    Comment: ELECTROLYTES REPEATED TO VERIFY Performed at [facility], [address], [place], [place] [address]   CBC with Differential     Status: Abnormal   Collection Time: 06/09/23 12:30 PM  Result Value Ref Range   WBC 19.7 (H) 4.0 - 10.5 K/uL   RBC 4.09 (L) 4.22 - 5.81 MIL/uL   Hemoglobin 14.0 13.0 - 17.0 g/dL   HCT 91.4 78.2 - 95.6 %   MCV 102.7 (H) 80.0 - 100.0 fL   MCH 34.2 (H) 26.0 - 34.0 pg   MCHC 33.3 30.0 - 36.0 g/dL    RDW 21.3 08.6 - 57.8 %   Platelets 158 150 - 400 K/uL   nRBC 0.1 0.0 - 0.2 %   Neutrophils Relative % 83 %   Neutro Abs 16.3 (H) 1.7 - 7.7 K/uL   Lymphocytes Relative 4 %   Lymphs Abs 0.7 0.7 - 4.0 K/uL   Monocytes Relative 9 %   Monocytes Absolute 1.7 (H) 0.1 - 1.0 K/uL   Eosinophils Relative 0 %   Eosinophils Absolute 0.0 0.0 - 0.5 K/uL  Basophils Relative 0 %   Basophils Absolute 0.1 0.0 - 0.1 K/uL   Immature Granulocytes 4 %   Abs Immature Granulocytes 0.87 (H) 0.00 - 0.07 K/uL    Comment: Performed at [facility], [address] [place], [place] [address]  Basic metabolic panel     Status: Abnormal   Collection Time: 06/09/23  5:00 PM  Result Value Ref Range   Sodium 132 (L) 135 - 145 mmol/L   Potassium 4.0 3.5 - 5.1 mmol/L   Chloride 97 (L) 98 - 111 mmol/L   CO2 22 22 - 32 mmol/L   Glucose, Bld 145 (H) 70 - 99 mg/dL    Comment: Glucose reference range applies only to samples taken after fasting for at least 8 hours.   BUN 28 (H) 8 - 23 mg/dL   Creatinine, Ser 6.04 0.61 - 1.24 mg/dL   Calcium 8.9 8.9 - 54.0 mg/dL   GFR, Estimated >98 >11 mL/min    Comment: (NOTE) Calculated using the CKD-EPI Creatinine Equation (2021)    Anion gap 13 5 - 15    Comment: Performed at [facility], [address], [place], [place] [address]  CK     Status: None   Collection Time: 06/09/23  9:30 PM  Result Value Ref Range   Total CK 106 49 - 397 U/L    Comment: Performed at [facility], [address], [place], [place] [address]  Sedimentation rate     Status: Abnormal   Collection Time: 06/09/23  9:30 PM  Result Value Ref Range   Sed Rate 49 (H) 0 - 16 mm/hr    Comment: Performed at [facility], [address] [place], [place] [address]   CT FOOT LEFT W CONTRAST  Result Date: 06/09/2023 CLINICAL DATA:  Left leg swelling and severe pain. Recent hip replacement. EXAM: CT OF THE LOWER LEFT EXTREMITY  WITH CONTRAST TECHNIQUE: Multidetector CT imaging of the lower left extremity was performed according to the standard protocol following intravenous contrast administration. RADIATION DOSE REDUCTION: This exam was performed according to the departmental dose-optimization program which includes automated exposure control, adjustment of the mA and/or kV according to patient size and/or use of iterative reconstruction technique. CONTRAST:  OMNIPAQUE IOHEXOL 300 MG/ML  SOLN COMPARISON:  None Available. FINDINGS: Bones/Joint/Cartilage Left THA. No acute fracture or dislocation. No knee joint effusion. Ligaments Suboptimally assessed by CT. Muscles and Tendons There is edema within the posterior compartment musculature in the thigh calf. Additional edema within the plantar muscles of the foot. No organized fluid collection or abscess. The muscles and tendons are grossly intact. Soft tissues Marked soft tissue swelling about the calf. This extends superiorly into the thigh and inferiorly into the dorsum of the foot. No organized fluid collection or abscess. No soft tissue gas. Vascular stent within the superficial femoral artery, popliteal artery, and tibioperoneal trunk. IMPRESSION: 1. Marked soft tissue swelling about the calf extending superiorly into the thigh and inferiorly into the dorsum of the foot. Findings are nonspecific but correlation for cellulitis is recommended. No organized fluid collection or abscess. No soft tissue gas. 2. Nonspecific edema within the posterior compartment musculature in the thigh and calf as well as the plantar muscles of the foot. Query myositis. No abscess. Electronically Signed   By: Minerva Fester M.D.   On: 06/09/2023 19:29   CT FEMUR LEFT W CONTRAST  Result Date: 06/09/2023 CLINICAL DATA:  Left leg swelling and severe  pain. Recent hip replacement. EXAM: CT OF THE LOWER LEFT EXTREMITY WITH CONTRAST TECHNIQUE: Multidetector CT imaging of the lower left extremity was  performed according to the standard protocol following intravenous contrast administration. RADIATION DOSE REDUCTION: This exam was performed according to the departmental dose-optimization program which includes automated exposure control, adjustment of the mA and/or kV according to patient size and/or use of iterative reconstruction technique. CONTRAST:  OMNIPAQUE IOHEXOL 300 MG/ML  SOLN COMPARISON:  None Available. FINDINGS: Bones/Joint/Cartilage Left THA. No acute fracture or dislocation. No knee joint effusion. Ligaments Suboptimally assessed by CT. Muscles and Tendons There is edema within the posterior compartment musculature in the thigh calf. Additional edema within the plantar muscles of the foot. No organized fluid collection or abscess. The muscles and tendons are grossly intact. Soft tissues Marked soft tissue swelling about the calf. This extends superiorly into the thigh and inferiorly into the dorsum of the foot. No organized fluid collection or abscess. No soft tissue gas. Vascular stent within the superficial femoral artery, popliteal artery, and tibioperoneal trunk. IMPRESSION: 1. Marked soft tissue swelling about the calf extending superiorly into the thigh and inferiorly into the dorsum of the foot. Findings are nonspecific but correlation for cellulitis is recommended. No organized fluid collection or abscess. No soft tissue gas. 2. Nonspecific edema within the posterior compartment musculature in the thigh and calf as well as the plantar muscles of the foot. Query myositis. No abscess. Electronically Signed   By: Minerva Fester M.D.   On: 06/09/2023 19:29   CT TIBIA FIBULA LEFT W CONTRAST  Result Date: 06/09/2023 CLINICAL DATA:  Left leg swelling and severe pain. Recent hip replacement. EXAM: CT OF THE LOWER LEFT EXTREMITY WITH CONTRAST TECHNIQUE: Multidetector CT imaging of the lower left extremity was performed according to the standard protocol following intravenous contrast  administration. RADIATION DOSE REDUCTION: This exam was performed according to the departmental dose-optimization program which includes automated exposure control, adjustment of the mA and/or kV according to patient size and/or use of iterative reconstruction technique. CONTRAST:  OMNIPAQUE IOHEXOL 300 MG/ML  SOLN COMPARISON:  None Available. FINDINGS: Bones/Joint/Cartilage Left THA. No acute fracture or dislocation. No knee joint effusion. Ligaments Suboptimally assessed by CT. Muscles and Tendons There is edema within the posterior compartment musculature in the thigh calf. Additional edema within the plantar muscles of the foot. No organized fluid collection or abscess. The muscles and tendons are grossly intact. Soft tissues Marked soft tissue swelling about the calf. This extends superiorly into the thigh and inferiorly into the dorsum of the foot. No organized fluid collection or abscess. No soft tissue gas. Vascular stent within the superficial femoral artery, popliteal artery, and tibioperoneal trunk. IMPRESSION: 1. Marked soft tissue swelling about the calf extending superiorly into the thigh and inferiorly into the dorsum of the foot. Findings are nonspecific but correlation for cellulitis is recommended. No organized fluid collection or abscess. No soft tissue gas. 2. Nonspecific edema within the posterior compartment musculature in the thigh and calf as well as the plantar muscles of the foot. Query myositis. No abscess. Electronically Signed   By: Minerva Fester M.D.   On: 06/09/2023 19:29   DG Hip Unilat W or Wo Pelvis 2-3 Views Left  Result Date: 06/09/2023 CLINICAL DATA:  Left leg swelling. EXAM: DG HIP (WITH OR WITHOUT PELVIS) 2-3V LEFT COMPARISON:  February 12, 2023. FINDINGS: Status post left hip arthroplasty. No acute fracture or dislocation is noted. IMPRESSION: No acute abnormality seen. Electronically Signed  By: Lupita Raider M.D.   On: 06/09/2023 16:10    Pending  Labs Unresulted Labs (From admission, onward)     Start     Ordered   06/10/23 0500  Comprehensive metabolic panel  Tomorrow morning,   R        06/10/23 0208   06/10/23 0500  CBC  Tomorrow morning,   R        06/10/23 0208   06/10/23 0500  Protime-INR  Tomorrow morning,   R        06/10/23 0208   06/10/23 0500  APTT  Tomorrow morning,   R        06/10/23 0208   06/10/23 0209  Culture, blood (Routine X 2) w Reflex to ID Panel  BLOOD CULTURE X 2,   R (with TIMED occurrences)     Question Answer Comment  Patient immune status Normal   Release to patient Immediate      06/10/23 0208   06/09/23 2053  C-reactive protein  Once,   URGENT        06/09/23 2052            Vitals/Pain Today's Vitals   06/09/23 1559 06/09/23 1956 06/09/23 2052 06/09/23 2353  BP: 138/78 120/66 121/69 115/67  Pulse: 92 (!) 101  (!) 109  Resp: 16 19  19   Temp: 99.1 F (37.3 C) 98.5 F (36.9 C)  99.3 F (37.4 C)  TempSrc: Oral     SpO2: 97% 95%  95%  Weight:      Height:        Isolation Precautions No active isolations  Medications Medications  aspirin EC tablet 81 mg (has no administration in time range)  rosuvastatin (CRESTOR) tablet 20 mg (has no administration in time range)  tamsulosin (FLOMAX) capsule 0.4 mg (has no administration in time range)  clopidogrel (PLAVIX) tablet 75 mg (has no administration in time range)  sodium chloride flush (NS) 0.9 % injection 3 mL (has no administration in time range)  sodium chloride flush (NS) 0.9 % injection 3 mL (has no administration in time range)  0.9 %  sodium chloride infusion (has no administration in time range)  acetaminophen (TYLENOL) tablet 650 mg (has no administration in time range)    Or  acetaminophen (TYLENOL) suppository 650 mg (has no administration in time range)  senna-docusate (Senokot-S) tablet 1 tablet (has no administration in time range)  ondansetron (ZOFRAN) tablet 4 mg (has no administration in time range)    Or   ondansetron (ZOFRAN) injection 4 mg (has no administration in time range)  morphine (PF) 2 MG/ML injection 2 mg (2 mg Intravenous Given 06/09/23 1310)  iohexol (OMNIPAQUE) 300 MG/ML solution 100 mL (100 mLs Intravenous Contrast Given 06/09/23 1716)  NIFEdipine (PROCARDIA XL/NIFEDICAL XL) 24 hr tablet 60 mg (60 mg Oral Given 06/09/23 2109)  gadobutrol (GADAVIST) 1 MMOL/ML injection 7 mL (7 mLs Intravenous Contrast Given 06/09/23 2334)    Mobility non-ambulatory     Focused Assessments     R Recommendations: See Admitting Provider Note  Report given to:   Additional Notes:

## 2023-06-10 NOTE — Progress Notes (Signed)
PHARMACY - PHYSICIAN COMMUNICATION CRITICAL VALUE ALERT - BLOOD CULTURE IDENTIFICATION (BCID)  Howard Jackson is an 77 y.o. male who presented to [facility] on [facility]/09/2023 with a chief complaint of LLE cellulitis  Assessment:  1/2 BCx (2/4 bottles) growing MSSA (suspect cellulitis source - of note, CT leg was unremarkable for abscess or septic arthritis)  Name of physician (or Provider) ContactedVirgel Manifold, NP  Current antibiotics: Ancef 1g IV q8 hr  Changes to prescribed antibiotics recommended: Increase Ancef to 2g IV q8 hr Recommendations accepted by provider  Results for orders placed or performed during the hospital encounter of 06/09/23  Blood Culture ID Panel (Reflexed) (Collected: 06/10/2023  3:00 AM)  Result Value Ref Range   Enterococcus faecalis NOT DETECTED NOT DETECTED   Enterococcus Faecium NOT DETECTED NOT DETECTED   Listeria monocytogenes NOT DETECTED NOT DETECTED   Staphylococcus species DETECTED (A) NOT DETECTED   Staphylococcus aureus (BCID) DETECTED (A) NOT DETECTED   Staphylococcus epidermidis NOT DETECTED NOT DETECTED   Staphylococcus lugdunensis NOT DETECTED NOT DETECTED   Streptococcus species NOT DETECTED NOT DETECTED   Streptococcus agalactiae NOT DETECTED NOT DETECTED   Streptococcus pneumoniae NOT DETECTED NOT DETECTED   Streptococcus pyogenes NOT DETECTED NOT DETECTED   A.calcoaceticus-baumannii NOT DETECTED NOT DETECTED   Bacteroides fragilis NOT DETECTED NOT DETECTED   Enterobacterales NOT DETECTED NOT DETECTED   Enterobacter cloacae complex NOT DETECTED NOT DETECTED   Escherichia coli NOT DETECTED NOT DETECTED   Klebsiella aerogenes NOT DETECTED NOT DETECTED   Klebsiella oxytoca NOT DETECTED NOT DETECTED   Klebsiella pneumoniae NOT DETECTED NOT DETECTED   Proteus species NOT DETECTED NOT DETECTED   Salmonella species NOT DETECTED NOT DETECTED   Serratia marcescens NOT DETECTED NOT DETECTED   Haemophilus influenzae NOT DETECTED NOT DETECTED    Neisseria meningitidis NOT DETECTED NOT DETECTED   Pseudomonas aeruginosa NOT DETECTED NOT DETECTED   Stenotrophomonas maltophilia NOT DETECTED NOT DETECTED   Candida albicans NOT DETECTED NOT DETECTED   Candida auris NOT DETECTED NOT DETECTED   Candida glabrata NOT DETECTED NOT DETECTED   Candida krusei NOT DETECTED NOT DETECTED   Candida parapsilosis NOT DETECTED NOT DETECTED   Candida tropicalis NOT DETECTED NOT DETECTED   Cryptococcus neoformans/gattii NOT DETECTED NOT DETECTED   Meth resistant mecA/C and MREJ NOT DETECTED NOT DETECTED    Viola Placeres A 06/10/2023  9:28 PM

## 2023-06-11 ENCOUNTER — Inpatient Hospital Stay (HOSPITAL_COMMUNITY): Payer: Medicare (Managed Care)

## 2023-06-11 DIAGNOSIS — M25552 Pain in left hip: Secondary | ICD-10-CM | POA: Diagnosis not present

## 2023-06-11 DIAGNOSIS — R7881 Bacteremia: Secondary | ICD-10-CM

## 2023-06-11 LAB — ECHOCARDIOGRAM COMPLETE
Area-P 1/2: 4.21 cm2
Height: 67 in
S' Lateral: 2.1 cm
Weight: 2328.06 [oz_av]

## 2023-06-11 MED ORDER — PREDNISONE 50 MG PO TABS
50.0000 mg | ORAL_TABLET | Freq: Every day | ORAL | Status: DC
  Administered 2023-06-12 – 2023-06-18 (×7): 50 mg via ORAL
  Filled 2023-06-11 (×7): qty 1

## 2023-06-11 NOTE — Consult Note (Signed)
[facility]    Date of Admission:  06/09/2023   Total days of inpatient antibiotics 2        Reason for Consult: MSSA bacteremia    Principal Problem:   Pain of left hip joint Active Problems:   Left lower extremity PVD status post stent placement x 5 04/24/2023   History of cerebrovascular accident (CVA) with residual deficit   BPH (benign prostatic hyperplasia)   Hyperlipidemia   Chronic diastolic heart failure (HCC)   Chronic hyponatremia   Malnutrition of moderate degree   Swelling of left lower extremity   History of CAD (coronary artery disease)   Cellulitis and abscess of left lower extremity   Assessment: 77 year old male with history of PAD s/p angioplasty/stenting on 04/24/2023, diastolic heart failure, CVA with left-sided hemiparesis, requires wheelchair to ambulate admitted with: #MSSA bacteremia likely secondary to left leg cellulitis #Poor dentition, no recent  dental procedures - Patient reports that he had a fall about a week ago ever since then his hip and leg has been hurting. - He states he has been doing okay since the stenting until the fall. - On arrival patient had white cell count 19K.  CT foot, tib-fib, femur showed marked soft tissue swelling calf extending to thigh and first dorsal foot nonspecific but correlation of cellulitis recommended.  MRI hip did not show signs of prosthetic material compromise. - Found to have MSSA bacteremia.  He does have poor dentition as well. Recommendations: - CT maxillofacial -Repeat blood cultures to ensure clearance - Continue cefazolin - TTE, will need TEE Microbiology:   Antibiotics: Cefazolin 11/27-present  Cultures: Blood 11/27 2/2 MSSA Urine  Other   HPI: Howard Jackson is a 77 y.o. male with past medical history of CAD, essential hypertension, CVA with a left-sided hemiparesis, PAD status post angioplasty/stenting on 810/11/24, heart failure, BPH presented to  ED with left-sided leg edema that been worsening.  Reported left hip pain as well.  On arrival to the ED, patient afebrile WBC 17K. Workup with imaging included CT foot, left femur, tibia, which showed marked soft tissue swelling about the calf extending superiorly to the thigh and dorsum of foot, possible cellulitis, possible plantar muscle myositis MRI hip.  Showed aseptic measures subcutaneous edema lateral hip associated skin thickening, nonspecific.  Degenerative disc disease and lateral disc pressure L4-L5.  ID engaged as blood cultures grew MSSA  Review of Systems: Review of Systems  All other systems reviewed and are negative.   Past Medical History:  Diagnosis Date   Adrenal gland disorder (HCC)    tumor present - no change-  no recent increase     Arthritis    Benign pheochromocytoma of right adrenal gland 06/23/2014   Never had histological diagnosis, but reportedly initially diagnosed in 1989   Bunion    left foot   Complication of anesthesia    "died on the table during cervical fusion" '89 - believed to be d/t "too much medication" to treat HTN and was later dx with pheochromocytoma   Coronary artery disease    cleared for surg. by Dr. Allyson Sabal - [facility]   H/O hiatal hernia    Hammer toe    Herniated lumbar intervertebral disc    History of anxiety    Hyperlipidemia    Hypertension    Neurogenic bladder    Neuromuscular disorder (HCC)    carpal tunnel problem, even after surgery release  Peripheral arterial disease (HCC)    Primary aldosteronism (HCC)    Stroke (HCC)    in [place]- 1999, L sided weakness, wears a brace on L leg & uses cane    Social History   Tobacco Use   Smoking status: Never   Smokeless tobacco: Never  Vaping Use   Vaping status: Never Used  Substance Use Topics   Alcohol use: Yes    Comment: social - one drink, on special occasion    Drug use: No    Family History  Problem Relation Age of Onset   CVA Father    Diabetes Father     Alzheimer's disease Father    Hypertension Sister    Hypertension Brother    CVA Paternal Grandmother    Hypertension Brother    Hypertension Mother    Scheduled Meds:  (feeding supplement) PROSource Plus  30 mL Oral BID   acetaminophen  1,000 mg Oral BID   aspirin EC  81 mg Oral Daily   clopidogrel  75 mg Oral Daily   enoxaparin (LOVENOX) injection  40 mg Subcutaneous Q24H   feeding supplement  237 mL Oral TID BM   ketorolac  15 mg Intravenous Q6H   labetalol  200 mg Oral BID   methocarbamol  500 mg Oral TID   multivitamin with minerals  1 tablet Oral Daily   pantoprazole  40 mg Oral Daily   [START ON 06/12/2023] predniSONE  50 mg Oral Q breakfast   Ensure Max Protein  11 oz Oral BID   rosuvastatin  20 mg Oral Daily   senna-docusate  1 tablet Oral BID   spironolactone  50 mg Oral Daily   tamsulosin  0.4 mg Oral Daily   Continuous Infusions:   ceFAZolin (ANCEF) IV 2 g (06/11/23 2221)   PRN Meds:.acetaminophen **OR** acetaminophen, levalbuterol, morphine **OR** morphine injection, ondansetron **OR** ondansetron (ZOFRAN) IV, tiZANidine Allergies  Allergen Reactions   Amitriptyline Hcl Hypertension   Other Shortness Of Breath and Other (See Comments)    SKIN is sensitive- Guards for the underwear MUST be removed immediately after using  Anesthesia- "They almost could not get me back" (HAS TO TAKE SPIRONOLACTONE NOW)   Losartan Potassium Other (See Comments)    Hypotension   Papaya Derivatives Itching and Other (See Comments)    Itching of the throat   Sulfa Antibiotics Other (See Comments)    Constipation     OBJECTIVE: Blood pressure 123/68, pulse 71, temperature (!) 97.5 F (36.4 C), temperature source Oral, resp. rate 16, height 5\' 7"  (1.702 m), weight 66 kg, SpO2 96%.  Physical Exam Constitutional:      General: He is not in acute distress.    Appearance: He is normal weight. He is not toxic-appearing.  HENT:     Head: Normocephalic and atraumatic.     Right  Ear: External ear normal.     Left Ear: External ear normal.     Nose: No congestion or rhinorrhea.     Mouth/Throat:     Mouth: Mucous membranes are moist.     Pharynx: Oropharynx is clear.  Eyes:     Extraocular Movements: Extraocular movements intact.     Conjunctiva/sclera: Conjunctivae normal.     Pupils: Pupils are equal, round, and reactive to light.  Cardiovascular:     Rate and Rhythm: Normal rate and regular rhythm.     Heart sounds: No murmur heard.    No friction rub. No gallop.  Pulmonary:  Effort: Pulmonary effort is normal.     Breath sounds: Normal breath sounds.  Abdominal:     General: Abdomen is flat. Bowel sounds are normal.     Palpations: Abdomen is soft.  Musculoskeletal:        General: No swelling.     Cervical back: Normal range of motion and neck supple.  Skin:    General: Skin is warm and dry.  Neurological:     General: No focal deficit present.     Mental Status: He is oriented to person, place, and time.  Psychiatric:        Mood and Affect: Mood normal.     Lab Results Lab Results  Component Value Date   WBC 15.9 (H) 06/10/2023   HGB 12.0 (L) 06/10/2023   HCT 36.6 (L) 06/10/2023   MCV 102.2 (H) 06/10/2023   PLT 156 06/10/2023    Lab Results  Component Value Date   CREATININE 0.73 06/10/2023   BUN 23 06/10/2023   NA 130 (L) 06/10/2023   K 3.5 06/10/2023   CL 95 (L) 06/10/2023   CO2 25 06/10/2023    Lab Results  Component Value Date   ALT 66 (H) 06/10/2023   AST 64 (H) 06/10/2023   ALKPHOS 58 06/10/2023   BILITOT 1.0 06/10/2023       Danelle Earthly, MD [facility] 06/11/2023, 11:31 PM I have personally spent 82 minutes involved in face-to-face and non-face-to-face activities for this patient on the day of the visit. Professional time spent includes the following activities: Preparing to see the patient (review of tests), Obtaining and/or reviewing separately obtained  history (admission/discharge record), Performing a medically appropriate examination and/or evaluation , Ordering medications/tests/procedures, referring and communicating with other health care professionals, Documenting clinical information in the EMR, Independently interpreting results (not separately reported), Communicating results to the patient/family/caregiver, Counseling and educating the patient/family/caregiver and Care coordination (not separately reported).

## 2023-06-11 NOTE — Progress Notes (Signed)
[facility] Progress Note Patient: Howard Jackson XLK:[id number] DOB: 1946-05-19 DOA: 06/09/2023  DOS: the patient was seen and examined on 06/11/2023  Brief hospital course: PMH of CAD, HTN, HLD, CVA with chronic left-sided hemiparesis and contracture, PAD with recent stenting in October, giant cell arteritis on chronic prednisone, chronic HFpEF. Presented with complaints of left-sided pain as well as swelling. Workup negative for any fracture or abscess or septic arthritis. Currently being treated with cellulitis and bacteremia with MSSA. ID consulted. Assessment and Plan: Left leg cellulitis with MSSA bacteremia. Left-sided hip joint pain Concern for septic arthritis or abscess ruled out. Presents with left leg pain. CT scan unremarkable. MRI negative for any joint issues.  Or abscess. No fractures. IV Cefazolin Blood Cultures Positive for MSSA. Echocardiogram Performed Repeat Cultures 1 Hypertension ID Following. Cellulitis Appears to Be Improving. Continue Pain Control.  History of temporal arteritis. Has been on prednisone since June 24, 60 mg daily.  Did not receive any biopsy of the mania. I do not think that he requires still high dose right now. Will continue same dose and monitor. Recommend follow-up with vascular surgery and neurology.   History of CVA with left-sided hemiplegia Continue aspirin 81 mg daily, Plavix 75 mg daily and Crestor 20 mg daily   Chronic hyponatremia Continue to monitor.   History of diastolic heart failure Essential hypertension Blood pressure stable.  Continue to hold medication.  History of CAD Continue aspirin and statin.   Reactive airway disease Continue Xopenex as needed for wheezing or shortness of breath.   Nonsustained V. tach nonsustained V. tach heart rate 150. Due to soft blood pressure cannot resume labetalol.  Patient usually takes labetalol 300 mg twice daily. Ordered Lopressor 2.5 mg as needed for for  tachycardia. If patient develops sustained medical tachycardia need to give amiodarone bolus. Continue cardiac monitoring.   BPH Continue tamsulosin 0.4 mg at bedtime   Subjective: No nausea no vomiting no fever no chills.  Pain better.  Physical Exam: General: in Mild distress, No Rash Cardiovascular: S1 and S2 Present, No Murmur Respiratory: Good respiratory effort, Bilateral Air entry present. No Crackles, No wheezes Abdomen: Bowel Sound present, No tenderness Extremities: Left leg edema and redness improving. Neuro: Alert and oriented x3, no new focal deficit  Data Reviewed: I have Reviewed nursing notes, Vitals, and Lab results. Since last encounter, pertinent lab results CBC and CMP   . I have ordered test including CBC CMP blood culture  .  Disposition: Status is: Inpatient Remains inpatient appropriate because: Monitor for improvement in bacteremia  enoxaparin (LOVENOX) injection 40 mg Start: 06/10/23 1400 SCDs Start: 06/10/23 0208 Place TED hose Start: 06/10/23 0208   Family Communication: No one at bedside Level of care: Telemetry   Vitals:   06/11/23 0439 06/11/23 0504 06/11/23 0506 06/11/23 1318  BP: 132/66   111/62  Pulse: (!) 112   91  Resp: (!) 24 18  16   Temp: 98.4 F (36.9 C)   98.6 F (37 C)  TempSrc: Oral   Oral  SpO2: 95%   91%  Weight:   66 kg   Height:         Author: Lynden Oxford, MD 06/11/2023 7:18 PM  Please look on [email or web address] to find out who is on call.

## 2023-06-11 NOTE — Hospital Course (Addendum)
Brief hospital course: PMH of CAD, HTN, HLD, CVA with chronic left-sided hemiparesis and contracture, PAD with recent stenting in October, giant cell arteritis on chronic prednisone, chronic HFpEF. Presented with complaints of left-sided pain as well as swelling. Workup negative for any fracture or abscess or septic arthritis. Currently being treated with cellulitis and bacteremia with MSSA. ID consulted. Assessment and Plan: Left leg cellulitis with MSSA bacteremia. Left-sided hip joint pain Concern for septic arthritis or abscess ruled out. Presents with left leg pain. CT scan unremarkable. MRI negative for any joint issues.  Or abscess. No fractures. IV Cefazolin. Blood Cultures Positive for MSSA. Echocardiogram shows aortic valve thickening.  Will need TEE.  Normal EF. Repeat Cultures performed.  ID Following. Cellulitis Appears to Be Improving. Continue Pain Control.  History of temporal arteritis. Has been on prednisone since June 24, 60 mg daily.  Did not receive any biopsy. I do not think that he requires still high dose right now. Will continue same dose and monitor. Recommend follow-up with vascular surgery and neurology.   History of CVA with left-sided hemiplegia Continue aspirin 81 mg daily, Plavix 75 mg daily and Crestor 20 mg daily   Chronic hyponatremia Continue to monitor.   History of diastolic heart failure Essential hypertension Blood pressure stable.  Continue to hold medication.  History of CAD Continue aspirin and statin.   Reactive airway disease Continue Xopenex as needed for wheezing or shortness of breath.   Nonsustained V. tach nonsustained V. tach heart rate 150. Due to soft blood pressure cannot resume labetalol.  Patient usually takes labetalol 300 mg twice daily. Lopressor 2.5 mg as needed for for tachycardia. Continue cardiac monitoring.   BPH Continue tamsulosin 0.4 mg at bedtime  Constipation. Continue bowel regimen.

## 2023-06-12 ENCOUNTER — Inpatient Hospital Stay (HOSPITAL_COMMUNITY): Payer: Medicare (Managed Care)

## 2023-06-12 DIAGNOSIS — M25552 Pain in left hip: Secondary | ICD-10-CM | POA: Diagnosis not present

## 2023-06-12 LAB — CBC
HCT: 33.9 % — ABNORMAL LOW (ref 39.0–52.0)
Hemoglobin: 11.4 g/dL — ABNORMAL LOW (ref 13.0–17.0)
MCH: 33.5 pg (ref 26.0–34.0)
MCHC: 33.6 g/dL (ref 30.0–36.0)
MCV: 99.7 fL (ref 80.0–100.0)
Platelets: 173 10*3/uL (ref 150–400)
RBC: 3.4 MIL/uL — ABNORMAL LOW (ref 4.22–5.81)
RDW: 14.2 % (ref 11.5–15.5)
WBC: 15.1 10*3/uL — ABNORMAL HIGH (ref 4.0–10.5)
nRBC: 0 % (ref 0.0–0.2)

## 2023-06-12 LAB — BASIC METABOLIC PANEL
Anion gap: 10 (ref 5–15)
BUN: 52 mg/dL — ABNORMAL HIGH (ref 8–23)
CO2: 23 mmol/L (ref 22–32)
Calcium: 8.3 mg/dL — ABNORMAL LOW (ref 8.9–10.3)
Chloride: 95 mmol/L — ABNORMAL LOW (ref 98–111)
Creatinine, Ser: 1.07 mg/dL (ref 0.61–1.24)
GFR, Estimated: >60 mL/min (ref >60)
Glucose, Bld: 216 mg/dL — ABNORMAL HIGH (ref 70–99)
Potassium: 4.4 mmol/L (ref 3.5–5.1)
Sodium: 128 mmol/L — ABNORMAL LOW (ref 135–145)

## 2023-06-12 LAB — CULTURE, BLOOD (ROUTINE X 2)
Special Requests: ADEQUATE
Special Requests: ADEQUATE

## 2023-06-12 LAB — MAGNESIUM: Magnesium: 1.8 mg/dL (ref 1.7–2.4)

## 2023-06-12 MED ORDER — MAGNESIUM HYDROXIDE 400 MG/5ML PO SUSP
30.0000 mL | Freq: Once | ORAL | Status: AC
  Administered 2023-06-12: 30 mL via ORAL
  Filled 2023-06-12: qty 30

## 2023-06-12 MED ORDER — DOCUSATE SODIUM 100 MG PO CAPS
100.0000 mg | ORAL_CAPSULE | Freq: Two times a day (BID) | ORAL | Status: DC
  Administered 2023-06-12 – 2023-06-13 (×3): 100 mg via ORAL
  Filled 2023-06-12 (×3): qty 1

## 2023-06-12 MED ORDER — POLYETHYLENE GLYCOL 3350 17 G PO PACK
17.0000 g | PACK | Freq: Every day | ORAL | Status: DC
  Administered 2023-06-14 – 2023-06-17 (×2): 17 g via ORAL
  Filled 2023-06-12 (×6): qty 1

## 2023-06-12 NOTE — NC FL2 (Signed)
[place] MEDICAID [facility] LEVEL OF CARE FORM     IDENTIFICATION  Patient Name: Howard Jackson Birthdate: 03-May-1946 Sex: male Admission Date (Current Location): 06/09/2023  [facility] and [place] Number:  Producer, television/film/video and Address:  [facility],  [address] [place], [place] [address]      Provider Number: [id number]  Attending Physician Name and Address:  Rolly Salter, MD  Relative Name and Phone Number:  Nasean, Higinbotham (Daughter)  [phone] [facility] Phone)    Current Level of Care: Hospital Recommended Level of Care: Skilled Nursing Facility Prior Approval Number:    Date Approved/Denied:   PASRR Number: [id number]  Discharge Plan: SNF    Current Diagnoses: Patient Active Problem List   Diagnosis Date Noted   Pain of left hip joint 06/10/2023   Swelling of left lower extremity 06/10/2023   History of CAD (coronary artery disease) 06/10/2023   Cellulitis and abscess of left lower extremity 06/10/2023   Critical limb ischemia of left lower extremity (HCC) 03/31/2023   Lumbar compression fracture (HCC) 02/12/2023   Malnutrition of moderate degree 12/18/2022   Temporal arteritis (HCC) 12/17/2022   Chronic diastolic heart failure (HCC) 12/17/2022   Chronic hyponatremia 12/17/2022   Tooth infection 12/17/2022   Hyperlipidemia 06/20/2018   Left spastic hemiparesis (HCC) 06/20/2018   Fall 04/14/2018   Fracture of femoral neck, left, closed (HCC) 04/14/2018   Closed displaced fracture of left femoral neck (HCC) 04/14/2018   Bunion    Hypertension due to endocrine disorder 03/07/2017   BPH (benign prostatic hyperplasia) 11/25/2016   Chronic bilateral thoracic back pain 08/20/2016   Mild intermittent asthma 06/18/2016   Wheezing 06/18/2016   Cough 06/18/2016   Left flank pain 04/18/2015   Benign pheochromocytoma of right adrenal gland 06/23/2014   Left lower extremity PVD status post stent placement x 5 04/24/2023 03/31/2013   HTN (hypertension)  03/31/2013   History of cerebrovascular accident (CVA) with residual deficit 03/31/2013   Coronary artery disease involving native coronary artery of native heart without angina pectoris 03/31/2013   Renal artery stenosis (HCC) 03/31/2013   Erectile dysfunction 03/31/2013   Dyslipidemia 03/31/2013   Carotid stenosis 03/31/2013   Left inguinal hernia 09/22/2012    Orientation RESPIRATION BLADDER Height & Weight     Self, Time, Situation, Place  Normal Incontinent, External catheter Weight: 65.6 kg Height:  5\' 7"  (170.2 cm)  BEHAVIORAL SYMPTOMS/MOOD NEUROLOGICAL BOWEL NUTRITION STATUS      Continent Diet (heart diet)  AMBULATORY STATUS COMMUNICATION OF NEEDS Skin   Extensive Assist Verbally Other (Comment) (Echymosis bilateral hips and bilateral buttocks)                       Personal Care Assistance Level of Assistance  Bathing, Feeding, Dressing Bathing Assistance: Maximum assistance Feeding assistance: Maximum assistance Dressing Assistance: Maximum assistance     Functional Limitations Info  Sight, Hearing, Speech Sight Info: Impaired Hearing Info: Adequate Speech Info: Adequate    SPECIAL CARE FACTORS FREQUENCY  PT (By licensed PT), OT (By licensed OT)     PT Frequency: 5x/wk OT Frequency: 5x/wk            Contractures Contractures Info: Present (Left hand and left elbow)    Additional Factors Info  Code Status, Allergies, Psychotropic Code Status Info: Full Code Allergies Info: Amitriptyline Hcl, Other, Losartan Potassium, Papaya Derivatives, Sulfa Antibiotics Psychotropic Info: N/A         Current Medications (06/12/2023):  This is  the current hospital active medication list Current Facility-Administered Medications  Medication Dose Route Frequency Provider Last Rate Last Admin   (feeding supplement) PROSource Plus liquid 30 mL  30 mL Oral BID Rolly Salter, MD   30 mL at 06/11/23 2320   acetaminophen (TYLENOL) tablet 650 mg  650 mg Oral Q6H  PRN Janalyn Shy, Subrina, MD   650 mg at 06/10/23 0421   Or   acetaminophen (TYLENOL) suppository 650 mg  650 mg Rectal Q6H PRN Janalyn Shy, Subrina, MD       acetaminophen (TYLENOL) tablet 1,000 mg  1,000 mg Oral BID Rolly Salter, MD   1,000 mg at 06/12/23 0847   aspirin EC tablet 81 mg  81 mg Oral Daily Sundil, Subrina, MD   81 mg at 06/12/23 0847   ceFAZolin (ANCEF) IVPB 2g/100 mL premix  2 g Intravenous Q8H Wofford, Drew A, RPH 200 mL/hr at 06/12/23 1323 2 g at 06/12/23 1323   clopidogrel (PLAVIX) tablet 75 mg  75 mg Oral Daily Sundil, Subrina, MD   75 mg at 06/12/23 0847   docusate sodium (COLACE) capsule 100 mg  100 mg Oral BID Rolly Salter, MD   100 mg at 06/12/23 0848   enoxaparin (LOVENOX) injection 40 mg  40 mg Subcutaneous Q24H Rolly Salter, MD   40 mg at 06/12/23 1354   feeding supplement (ENSURE ENLIVE / ENSURE PLUS) liquid 237 mL  237 mL Oral TID BM Rolly Salter, MD   237 mL at 06/12/23 1355   labetalol (NORMODYNE) tablet 200 mg  200 mg Oral BID Rolly Salter, MD   200 mg at 06/12/23 0845   levalbuterol (XOPENEX) nebulizer solution 0.63 mg  0.63 mg Nebulization Q6H PRN Sundil, Subrina, MD       methocarbamol (ROBAXIN) tablet 500 mg  500 mg Oral TID Rolly Salter, MD   500 mg at 06/12/23 0848   morphine (MSIR) tablet 15 mg  15 mg Oral Q4H PRN Rolly Salter, MD   15 mg at 06/12/23 1353   Or   morphine (PF) 2 MG/ML injection 2 mg  2 mg Intravenous Q4H PRN Rolly Salter, MD       multivitamin with minerals tablet 1 tablet  1 tablet Oral Daily Rolly Salter, MD   1 tablet at 06/12/23 0846   ondansetron (ZOFRAN) tablet 4 mg  4 mg Oral Q6H PRN Janalyn Shy, Subrina, MD       Or   ondansetron [facility]) injection 4 mg  4 mg Intravenous Q6H PRN Sundil, Subrina, MD       pantoprazole (PROTONIX) EC tablet 40 mg  40 mg Oral Daily Rolly Salter, MD   40 mg at 06/12/23 0846   polyethylene glycol (MIRALAX / GLYCOLAX) packet 17 g  17 g Oral Daily Rolly Salter, MD       predniSONE  (DELTASONE) tablet 50 mg  50 mg Oral Q breakfast Rolly Salter, MD   50 mg at 06/12/23 0846   protein supplement (ENSURE MAX) liquid  11 oz Oral BID Rolly Salter, MD   11 oz at 06/11/23 2210   rosuvastatin (CRESTOR) tablet 20 mg  20 mg Oral Daily Sundil, Subrina, MD   20 mg at 06/12/23 0846   senna-docusate (Senokot-S) tablet 1 tablet  1 tablet Oral BID Rolly Salter, MD   1 tablet at 06/12/23 0849   spironolactone (ALDACTONE) tablet 50 mg  50 mg Oral Daily Lynden Oxford M,  MD   50 mg at 06/12/23 0845   tamsulosin (FLOMAX) capsule 0.4 mg  0.4 mg Oral Daily Sundil, Subrina, MD   0.4 mg at 06/12/23 0845   tiZANidine (ZANAFLEX) tablet 2 mg  2 mg Oral Daily PRN Tereasa Coop, MD         Discharge Medications: Please see discharge summary for a list of discharge medications.  Relevant Imaging Results:  Relevant Lab Results:   Additional Information SSN: [id number]  Howell Rucks, RN

## 2023-06-12 NOTE — TOC Progression Note (Addendum)
Transition of Care [facility]) - Progression Note    Patient Details  Name: Howard Jackson MRN: [id number] Date of Birth: 29-Jan-1946  Transition of [facility]) CM/SW Contact  Howell Rucks, RN Phone Number: 06/12/2023, 9:23 AM  Clinical Narrative:  .Call received from bedside nurse, Abby, on 06/10/23, reports patient reports if he needs short term rehab/SNF, he prefers [facility]. TOC will continue to follow.  -11:50am Call from [place], CM with [facility], updated on pt's medical status, informed PT eval pending, awaiting recommendation. Lucky Rathke confirmed she will assist with short term rehab auth if PT recommendation, will provide list of contracted SNF, informed pt requesting [facility], confirmed facility is a contracted facility. TOC will continue to follow.    -3:37pm PT eval completed, recommendation for short term rehab/SNF. NCM called to pt's dtr (Am[place]), reports discussed with pt a few days ago, agreeable to short term rehab/SNF at [facility]. FL2 completed.   -3;39pm NCM call to [place], CM, [facility] to initiate auth for short term rehab.   Expected Discharge Plan: Home w Home Health Services    Expected Discharge Plan and Services       Living arrangements for the past 2 months: Apartment                                       Social Determinants of Health (SDOH) Interventions SDOH Screenings   Food Insecurity: No Food Insecurity (06/10/2023)  Housing: Low Risk  (06/10/2023)  Transportation Needs: No Transportation Needs (06/10/2023)  Utilities: Not At Risk (06/10/2023)  Social Connections: Unknown (11/25/2021)   Received from [facility]  Tobacco Use: Low Risk  (06/10/2023)    Readmission Risk Interventions    06/10/2023   10:26 AM 02/16/2023    3:07 PM  Readmission Risk Prevention Plan  Transportation Screening Complete Complete  PCP or Specialist Appt within 5-7 Days Complete   PCP or Specialist Appt within  3-5 Days  Complete  Home Care Screening Complete   Medication Review (RN CM) Complete   HRI or Home Care Consult  Complete  Social Work Consult for Recovery Care Planning/Counseling  Complete  Palliative Care Screening  Not Applicable  Medication Review Oceanographer)  Complete

## 2023-06-12 NOTE — Progress Notes (Signed)
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 Presented with complaints of left-sided pain as well as swelling. Workup negative for any fracture or abscess or septic arthritis. Currently being treated with cellulitis and bacteremia with MSSA. ID consulted. Assessment and Plan: Left leg cellulitis with MSSA bacteremia. Left-sided hip joint pain Concern for septic arthritis or abscess ruled out. Presents with left leg pain. CT scan unremarkable. MRI negative for any joint issues.  Or abscess. No fractures. IV Cefazolin. Blood Cultures Positive for MSSA. Echocardiogram shows aortic valve thickening.  Will need TEE.  Normal EF. Repeat Cultures performed.  ID Following. Cellulitis Appears to Be Improving. Continue Pain Control.  History of temporal arteritis. Has been on prednisone since June 24, 60 mg daily.  Did not receive any biopsy. I do not think that he requires still high dose right now. Will continue same dose and monitor. Recommend follow-up with vascular surgery and neurology.   History of CVA with left-sided hemiplegia Continue aspirin 81 mg daily, Plavix 75 mg daily and Crestor 20 mg daily   Chronic hyponatremia Continue to monitor.   History of diastolic heart failure Essential hypertension Blood pressure stable.  Continue to hold medication.  History of CAD Continue aspirin and statin.   Reactive airway disease Continue Xopenex as needed for wheezing or shortness of breath.   Nonsustained V. tach nonsustained V. tach heart rate 150. Due to soft blood pressure cannot resume labetalol.  Patient usually takes labetalol 300 mg twice daily. Lopressor 2.5  mg as needed for for tachycardia. Continue cardiac monitoring.   BPH Continue tamsulosin 0.4 mg at bedtime  Constipation. Continue bowel regimen.   Subjective: No nausea no vomiting no fever no chills.  Pain improving.  Physical Exam: General: in Mild distress, No Rash Cardiovascular: S1 and S2 Present, No Murmur Respiratory: Good respiratory effort, Bilateral Air entry present. No Crackles, No wheezes Abdomen: Bowel Sound present, No tenderness Extremities: Improving left leg edema, improving left knee redness Neuro: Alert and oriented x3, no new focal deficit  Data Reviewed: I have Reviewed nursing notes, Vitals, and Lab results. Since last encounter, pertinent lab results CBC and BMP   . I have ordered test including CBC and BMP  .   Disposition: Status is: Inpatient Remains inpatient appropriate because: Will require further workup  enoxaparin (LOVENOX) injection 40 mg Start: 06/10/23 1400 SCDs Start: 06/10/23 0208 Place TED hose Start: 06/10/23 0208   Family Communication: No one at bedside Level of care: Telemetry   Vitals:   06/11/23 2038 06/12/23 0444 06/12/23 0446 06/12/23 1247  BP: 123/68  (!) 144/65 (!) 141/72  Pulse: 71  87 80  Resp:   20 (!) 22  Temp: (!) 97.5 F (36.4 C)  (!) 97.4 F (36.3 C) (!) 97.3 F (36.3 C)  TempSrc: Oral  Oral Oral  SpO2: 96%  98% 99%  Weight:  65.6 kg    Height:         Author: Lynden Oxford, MD 06/12/2023 6:59 PM  Please look on [email or web address] to find out who is on call.

## 2023-06-12 NOTE — Plan of Care (Signed)
  Problem: Activity: Goal: Risk for activity intolerance will decrease Outcome: Progressing   Problem: Pain Management: Goal: General experience of comfort will improve Outcome: Progressing   Problem: Skin Integrity: Goal: Risk for impaired skin integrity will decrease Outcome: Progressing

## 2023-06-12 NOTE — Evaluation (Signed)
Physical Therapy Evaluation Patient Details Name: Howard Jackson MRN: [id number] DOB: Mar 27, 1946 Today's Date: 06/12/2023  History of Present Illness  77 y.o. male admitted 06/10/23 with LLE cellulitis with MSSA bacteremia. 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  Clinical Impression  Pt admitted with above diagnosis. Max assist for supine to sit, pt sat edge of bed ~8 minutes unsupported. +2 total assist for sit to supine. 8/10 L hip pain limits activity tolerance. Patient will benefit from continued inpatient follow up therapy, <3 hours/day. Pt currently with functional limitations due to the deficits listed below (see PT Problem List). Pt will benefit from acute skilled PT to increase their independence and safety with mobility to allow discharge.           If plan is discharge home, recommend the following: Two people to help with walking and/or transfers;A lot of help with bathing/dressing/bathroom;Assistance with cooking/housework;Assist for transportation;Help with stairs or ramp for entrance   Can travel by private vehicle   No    Equipment Recommendations None recommended by PT  Recommendations for Other Services       Functional Status Assessment Patient has had a recent decline in their functional status and demonstrates the ability to make significant improvements in function in a reasonable and predictable amount of time.     Precautions / Restrictions Precautions Precautions: Fall Precaution Comments: L hemiplegia Restrictions Weight Bearing Restrictions: No      Mobility  Bed Mobility Overal bed mobility: Needs Assistance Bed Mobility: Supine to Sit, Sit to Supine     Supine to sit: Max assist, HOB elevated, Used rails Sit to supine: +2 for physical assistance, Total assist   General bed  mobility comments: required 3 attempts to successfully come from supine to sit, max assist to advance LLE, raise trunk, and pivot hips; all movement with increased time 2* pain; pt sat unsupported at EOB ~8 minutes    Transfers                        Ambulation/Gait                  Stairs            Wheelchair Mobility     Tilt Bed    Modified Rankin (Stroke Patients Only)       Balance                                             Pertinent Vitals/Pain Pain Assessment Pain Score: 8  Pain Location: L LE Pain Descriptors / Indicators: Grimacing, Guarding, Moaning, Constant Pain Intervention(s): Monitored during session, Patient requesting pain meds-RN notified, Limited activity within patient's tolerance, Repositioned    Home Living Family/patient expects to be discharged to:: Skilled nursing facility Living Arrangements: Alone Available Help at Discharge: Personal care attendant;Family;Available PRN/intermittently Type of Home: Apartment Home Access: Level entry       Home Layout: One level Home Equipment: Wheelchair - power;Tub bench;Hospital bed Additional Comments: grab bar next to his bed?    Prior Function Prior Level of Function : Needs assist             Mobility Comments: can pivot to his w/c  with use of bars ADLs Comments: He required assist from his home aides for cleaning, meal prep, showering,dressing and changing his depends, but reports caregivers have been inconsistent since he started with [facility].     Extremity/Trunk Assessment   Upper Extremity Assessment LUE Deficits / Details: dense L hemiplegia with increased flexor tone, has a splint at home LUE Coordination: decreased fine motor;decreased gross motor    Lower Extremity Assessment Lower Extremity Assessment: LLE deficits/detail;RLE deficits/detail RLE Deficits / Details: knee ext 4/5 RLE Sensation: WNL RLE Coordination: WNL LLE Deficits /  Details: pitting edema noted at ankle, knee ext +1/5, L hip very painful with all movement, L ankle +1/5 trace DF/PF AROM; ankle contracted in PF LLE: Unable to fully assess due to pain LLE Sensation: WNL LLE Coordination: decreased gross motor;decreased fine motor    Cervical / Trunk Assessment Cervical / Trunk Assessment: Other exceptions (weakness)  Communication   Communication Communication: No apparent difficulties  Cognition Arousal: Alert Behavior During Therapy: WFL for tasks assessed/performed Overall Cognitive Status: No family/caregiver present to determine baseline cognitive functioning                                 General Comments: appears intact        General Comments      Exercises     Assessment/Plan    PT Assessment Patient needs continued PT services  PT Problem List Decreased strength;Decreased range of motion;Decreased activity tolerance;Decreased mobility;Decreased coordination       PT Treatment Interventions Functional mobility training;Therapeutic activities;Therapeutic exercise;Balance training;Patient/family education    PT Goals (Current goals can be found in the Care Plan section)  Acute Rehab PT Goals Patient Stated Goal: to be able to transfer to [facility] and to toilet PT Goal Formulation: With patient Time For Goal Achievement: 06/26/23 Potential to Achieve Goals: Fair    Frequency Min 1X/week     Co-evaluation               AM-PAC PT "6 Clicks" Mobility  Outcome Measure Help needed turning from your back to your side while in a flat bed without using bedrails?: A Lot Help needed moving from lying on your back to sitting on the side of a flat bed without using bedrails?: Total Help needed moving to and from a bed to a chair (including a wheelchair)?: Total Help needed standing up from a chair using your arms (e.g., wheelchair or bedside chair)?: Total Help needed to walk in hospital room?: Total Help needed  climbing 3-5 steps with a railing? : Total 6 Click Score: 7    End of Session   Activity Tolerance: Patient limited by fatigue;Patient limited by pain Patient left: in bed;with call bell/phone within reach;with bed alarm set Nurse Communication: Mobility status;Need for lift equipment PT Visit Diagnosis: Hemiplegia and hemiparesis;Muscle weakness (generalized) (M62.81);Other abnormalities of gait and mobility (R26.89);Pain Hemiplegia - Right/Left: Left Hemiplegia - dominant/non-dominant: Non-dominant Pain - Right/Left: Left Pain - part of body: Hip    Time: 4098-1191 PT Time Calculation (min) (ACUTE ONLY): 27 min   Charges:   PT Evaluation $PT Eval Moderate Complexity: 1 Mod PT Treatments $Therapeutic Activity: 8-22 mins PT General Charges $$ ACUTE PT VISIT: 1 Visit         Tamala Ser PT 06/12/2023  Acute Rehabilitation Services  Office [phone]

## 2023-06-13 DIAGNOSIS — M25552 Pain in left hip: Secondary | ICD-10-CM | POA: Diagnosis not present

## 2023-06-13 LAB — BASIC METABOLIC PANEL
Anion gap: 9 (ref 5–15)
BUN: 47 mg/dL — ABNORMAL HIGH (ref 8–23)
CO2: 27 mmol/L (ref 22–32)
Calcium: 8.2 mg/dL — ABNORMAL LOW (ref 8.9–10.3)
Chloride: 91 mmol/L — ABNORMAL LOW (ref 98–111)
Creatinine, Ser: 0.92 mg/dL (ref 0.61–1.24)
GFR, Estimated: >60 mL/min (ref >60)
Glucose, Bld: 252 mg/dL — ABNORMAL HIGH (ref 70–99)
Potassium: 4.9 mmol/L (ref 3.5–5.1)
Sodium: 127 mmol/L — ABNORMAL LOW (ref 135–145)

## 2023-06-13 LAB — CBC
HCT: 33.9 % — ABNORMAL LOW (ref 39.0–52.0)
Hemoglobin: 10.8 g/dL — ABNORMAL LOW (ref 13.0–17.0)
MCH: 33 pg (ref 26.0–34.0)
MCHC: 31.9 g/dL (ref 30.0–36.0)
MCV: 103.7 fL — ABNORMAL HIGH (ref 80.0–100.0)
Platelets: 167 10*3/uL (ref 150–400)
RBC: 3.27 MIL/uL — ABNORMAL LOW (ref 4.22–5.81)
RDW: 14.2 % (ref 11.5–15.5)
WBC: 16.3 10*3/uL — ABNORMAL HIGH (ref 4.0–10.5)
nRBC: 0 % (ref 0.0–0.2)

## 2023-06-13 LAB — MAGNESIUM: Magnesium: 2.1 mg/dL (ref 1.7–2.4)

## 2023-06-13 MED ORDER — SENNOSIDES-DOCUSATE SODIUM 8.6-50 MG PO TABS
2.0000 | ORAL_TABLET | Freq: Two times a day (BID) | ORAL | Status: DC
  Administered 2023-06-14 – 2023-06-16 (×5): 2 via ORAL
  Filled 2023-06-13 (×6): qty 2

## 2023-06-13 MED ORDER — FUROSEMIDE 20 MG PO TABS
20.0000 mg | ORAL_TABLET | Freq: Every day | ORAL | Status: DC
  Administered 2023-06-13 – 2023-06-15 (×3): 20 mg via ORAL
  Filled 2023-06-13 (×3): qty 1

## 2023-06-13 MED ORDER — ORAL CARE MOUTH RINSE: 15.0000 mL | OROMUCOSAL | Status: DC | PRN

## 2023-06-13 NOTE — Plan of Care (Signed)
  Problem: Coping: Goal: Level of anxiety will decrease Outcome: Progressing   Problem: Elimination: Goal: Will not experience complications related to bowel motility Outcome: Progressing   Problem: Pain Management: Goal: General experience of comfort will improve Outcome: Progressing   Problem: Skin Integrity: Goal: Risk for impaired skin integrity will decrease Outcome: Progressing

## 2023-06-13 NOTE — Progress Notes (Signed)
[facility] Progress Note Patient: Howard Jackson QIO:[id number] DOB: [birth year] DOA: 06/09/2023  DOS: the patient was seen and examined on 06/13/2023  Brief hospital course: PMH of CAD, HTN, HLD, CVA with chronic left-sided hemiparesis and contracture, PAD with recent stenting in October, giant cell arteritis on chronic prednisone, chronic HFpEF. Presented with complaints of left-sided pain as well as swelling. Workup negative for any fracture or abscess or septic arthritis. Currently being treated with cellulitis and bacteremia with MSSA. ID consulted. Assessment and Plan: Left leg cellulitis with MSSA bacteremia. Left-sided hip joint pain Concern for septic arthritis or abscess ruled out. Presents with left leg pain. CT scan unremarkable. MRI negative for any joint issues.  Or abscess. No fractures. IV Cefazolin. Blood Cultures Positive for MSSA. Echocardiogram shows aortic valve thickening.  Will need TEE.  Discussed with cardiology.  Normal EF. Repeat Cultures performed.  So far negative.  ID Following. Cellulitis Appears to Be Improving. Continue Pain Control.  History of temporal arteritis. Has been on prednisone since June 24, 60 mg daily.  Did not receive any biopsy. I do not think that he requires still high dose right now. Will continue same dose and monitor. Recommend follow-up with vascular surgery and neurology.   History of CVA with left-sided hemiplegia Continue aspirin 81 mg daily, Plavix 75 mg daily and Crestor 20 mg daily   Chronic hyponatremia Continue to monitor.   History of diastolic heart failure Essential hypertension Blood pressure stable.  Continue to hold medication.  History of CAD Continue aspirin and statin.   Reactive airway disease Continue Xopenex as needed for wheezing or shortness of breath.   Nonsustained V. tach nonsustained V. tach heart rate 150. Due to soft blood pressure cannot resume labetalol.  Patient usually takes  labetalol 300 mg twice daily. Lopressor 2.5 mg as needed for for tachycardia. Continue cardiac monitoring.   BPH Continue tamsulosin 0.4 mg at bedtime  Constipation. Continue bowel regimen.   Subjective: No nausea no vomiting no fever no chills.  Cough present.  Constipation resolved.  Physical Exam: General: in Mild distress, No Rash Cardiovascular: S1 and S2 Present, No Murmur Respiratory: Good respiratory effort, Bilateral Air entry present. No Crackles, No wheezes Abdomen: Bowel Sound present, No tenderness Extremities: No edema Neuro: Alert and oriented x3, no new focal deficit, chronic left-sided contracture weakness  Data Reviewed: I have Reviewed nursing notes, Vitals, and Lab results. Since last encounter, pertinent lab results CBC and BMP   . I have ordered test including CBC and BMP  .   Disposition: Status is: Inpatient Remains inpatient appropriate because: Need IV antibiotics and further workup  enoxaparin (LOVENOX) injection 40 mg Start: 06/10/23 1400 SCDs Start: 06/10/23 0208 Place TED hose Start: 06/10/23 0208   Family Communication: No one at bedside Level of care: Telemetry   Vitals:   06/13/23 0549 06/13/23 0859 06/13/23 1122 06/13/23 1126  BP: (!) 156/69 (!) 156/69 (!) 152/68 (!) 150/60  Pulse: 80  66   Resp: 17     Temp: 97.9 F (36.6 C)  98.3 F (36.8 C)   TempSrc: Oral  Oral   SpO2: 100%  98%   Weight:      Height:         Author: Lynden Oxford, MD 06/13/2023 6:57 PM  Please look on [email or web address] to find out who is on call.

## 2023-06-14 DIAGNOSIS — M25552 Pain in left hip: Secondary | ICD-10-CM | POA: Diagnosis not present

## 2023-06-14 LAB — BASIC METABOLIC PANEL
Anion gap: 8 (ref 5–15)
BUN: 38 mg/dL — ABNORMAL HIGH (ref 8–23)
CO2: 26 mmol/L (ref 22–32)
Calcium: 8.3 mg/dL — ABNORMAL LOW (ref 8.9–10.3)
Chloride: 92 mmol/L — ABNORMAL LOW (ref 98–111)
Creatinine, Ser: 0.85 mg/dL (ref 0.61–1.24)
GFR, Estimated: >60 mL/min (ref >60)
Glucose, Bld: 278 mg/dL — ABNORMAL HIGH (ref 70–99)
Potassium: 5.2 mmol/L — ABNORMAL HIGH (ref 3.5–5.1)
Sodium: 126 mmol/L — ABNORMAL LOW (ref 135–145)

## 2023-06-14 LAB — CBC
HCT: 34.9 % — ABNORMAL LOW (ref 39.0–52.0)
Hemoglobin: 11.4 g/dL — ABNORMAL LOW (ref 13.0–17.0)
MCH: 33.2 pg (ref 26.0–34.0)
MCHC: 32.7 g/dL (ref 30.0–36.0)
MCV: 101.7 fL — ABNORMAL HIGH (ref 80.0–100.0)
Platelets: 186 10*3/uL (ref 150–400)
RBC: 3.43 MIL/uL — ABNORMAL LOW (ref 4.22–5.81)
RDW: 14.3 % (ref 11.5–15.5)
WBC: 15.2 10*3/uL — ABNORMAL HIGH (ref 4.0–10.5)
nRBC: 0 % (ref 0.0–0.2)

## 2023-06-14 LAB — MAGNESIUM: Magnesium: 1.9 mg/dL (ref 1.7–2.4)

## 2023-06-14 NOTE — H&P (View-Only) (Signed)
[facility] Progress Note Patient: Howard Jackson ZOX:[id number] DOB: 02/27/1946 DOA: 06/09/2023  DOS: the patient was seen and examined on 06/14/2023  Brief hospital course: PMH of CAD, HTN, HLD, CVA with chronic left-sided hemiparesis and contracture, PAD with recent stenting in October, giant cell arteritis on chronic prednisone, chronic HFpEF. Presented with complaints of left-sided pain as well as swelling. Workup negative for any fracture or abscess or septic arthritis. Currently being treated with cellulitis and bacteremia with MSSA. ID consulted. Assessment and Plan: Left leg cellulitis with MSSA bacteremia. Left-sided hip joint pain Concern for septic arthritis or abscess ruled out. Presents with left leg pain. CT scan unremarkable. MRI negative for any joint issues.  Or abscess. No fractures. IV Cefazolin. Blood Cultures Positive for MSSA. Echocardiogram shows aortic valve thickening.  Will need TEE.  Likely scheduled for tomorrow. Discussed with cardiology.  Normal EF. Repeat Cultures performed.  So far negative.  ID Following. Cellulitis Appears to Be Improving. Continue Pain Control.  History of temporal arteritis. Has been on prednisone since June 24, 60 mg daily.  Did not receive any biopsy. I do not think that he requires still high dose right now. Will continue same dose and monitor. Recommend follow-up with vascular surgery and neurology.   History of CVA with left-sided hemiplegia Continue aspirin 81 mg daily, Plavix 75 mg daily and Crestor 20 mg daily   Chronic hyponatremia Continue to monitor.   History of diastolic heart failure Essential hypertension Blood pressure stable.  Continue to hold medication.  History of CAD Continue aspirin and statin.   Reactive airway disease Continue Xopenex as needed for wheezing or shortness of breath.   Nonsustained V. tach nonsustained V. tach heart rate 150. Due to soft blood pressure cannot resume  labetalol.  Patient usually takes labetalol 300 mg twice daily. Lopressor 2.5 mg as needed for for tachycardia. Continue cardiac monitoring.   BPH Continue tamsulosin 0.4 mg at bedtime  Constipation. Continue bowel regimen.  Moderate protein calorie malnutrition. Interventions: Premier Protein, MVI Body mass index is 22.37 kg/m.  Continue supplementation.   Subjective: No nausea no vomiting no fever no chills.  Continues to have shortness of breath.  Continues to have cough.  Physical Exam: General: in Mild distress, No Rash Cardiovascular: S1 and S2 Present, No Murmur Respiratory: Good respiratory effort, Bilateral Air entry present.  Basal crackles, No wheezes Abdomen: Bowel Sound present, No tenderness Extremities: No edema Neuro: Alert and oriented x3, no new focal deficit  Data Reviewed: I have Reviewed nursing notes, Vitals, and Lab results. Since last encounter, pertinent lab results CBC and BMP   . I have ordered test including CBC and BMP  .   Disposition: Status is: Inpatient Remains inpatient appropriate because: Monitor for improvement in bacteremia  enoxaparin (LOVENOX) injection 40 mg Start: 06/10/23 1400 SCDs Start: 06/10/23 0208 Place TED hose Start: 06/10/23 0208   Family Communication: No one at bedside Level of care: Telemetry   Vitals:   06/14/23 0500 06/14/23 0616 06/14/23 0802 06/14/23 1002  BP:  (!) 164/70 (!) 155/69 132/85  Pulse:  71 72 73  Resp:   16   Temp:  98 F (36.7 C) 98 F (36.7 C)   TempSrc:  Oral Oral   SpO2:  99% 100%   Weight: 64.8 kg     Height:         Author: Lynden Oxford, MD 06/14/2023 6:06 PM  Please look on [email or web address] to find out who is on call.

## 2023-06-14 NOTE — Plan of Care (Signed)
  Problem: Clinical Measurements: Goal: Diagnostic test results will improve Outcome: Progressing Goal: Cardiovascular complication will be avoided Outcome: Progressing   Problem: Nutrition: Goal: Adequate nutrition will be maintained Outcome: Progressing   Problem: Elimination: Goal: Will not experience complications related to bowel motility Outcome: Progressing

## 2023-06-14 NOTE — Plan of Care (Signed)
Discssion of D/c needs and planning

## 2023-06-14 NOTE — Progress Notes (Addendum)
[facility] Progress Note Patient: Howard Jackson ZOX:[id number] DOB: 02/27/1946 DOA: 06/09/2023  DOS: the patient was seen and examined on 06/14/2023  Brief hospital course: PMH of CAD, HTN, HLD, CVA with chronic left-sided hemiparesis and contracture, PAD with recent stenting in October, giant cell arteritis on chronic prednisone, chronic HFpEF. Presented with complaints of left-sided pain as well as swelling. Workup negative for any fracture or abscess or septic arthritis. Currently being treated with cellulitis and bacteremia with MSSA. ID consulted. Assessment and Plan: Left leg cellulitis with MSSA bacteremia. Left-sided hip joint pain Concern for septic arthritis or abscess ruled out. Presents with left leg pain. CT scan unremarkable. MRI negative for any joint issues.  Or abscess. No fractures. IV Cefazolin. Blood Cultures Positive for MSSA. Echocardiogram shows aortic valve thickening.  Will need TEE.  Likely scheduled for tomorrow. Discussed with cardiology.  Normal EF. Repeat Cultures performed.  So far negative.  ID Following. Cellulitis Appears to Be Improving. Continue Pain Control.  History of temporal arteritis. Has been on prednisone since June 24, 60 mg daily.  Did not receive any biopsy. I do not think that he requires still high dose right now. Will continue same dose and monitor. Recommend follow-up with vascular surgery and neurology.   History of CVA with left-sided hemiplegia Continue aspirin 81 mg daily, Plavix 75 mg daily and Crestor 20 mg daily   Chronic hyponatremia Continue to monitor.   History of diastolic heart failure Essential hypertension Blood pressure stable.  Continue to hold medication.  History of CAD Continue aspirin and statin.   Reactive airway disease Continue Xopenex as needed for wheezing or shortness of breath.   Nonsustained V. tach nonsustained V. tach heart rate 150. Due to soft blood pressure cannot resume  labetalol.  Patient usually takes labetalol 300 mg twice daily. Lopressor 2.5 mg as needed for for tachycardia. Continue cardiac monitoring.   BPH Continue tamsulosin 0.4 mg at bedtime  Constipation. Continue bowel regimen.  Moderate protein calorie malnutrition. Interventions: Premier Protein, MVI Body mass index is 22.37 kg/m.  Continue supplementation.   Subjective: No nausea no vomiting no fever no chills.  Continues to have shortness of breath.  Continues to have cough.  Physical Exam: General: in Mild distress, No Rash Cardiovascular: S1 and S2 Present, No Murmur Respiratory: Good respiratory effort, Bilateral Air entry present.  Basal crackles, No wheezes Abdomen: Bowel Sound present, No tenderness Extremities: No edema Neuro: Alert and oriented x3, no new focal deficit  Data Reviewed: I have Reviewed nursing notes, Vitals, and Lab results. Since last encounter, pertinent lab results CBC and BMP   . I have ordered test including CBC and BMP  .   Disposition: Status is: Inpatient Remains inpatient appropriate because: Monitor for improvement in bacteremia  enoxaparin (LOVENOX) injection 40 mg Start: 06/10/23 1400 SCDs Start: 06/10/23 0208 Place TED hose Start: 06/10/23 0208   Family Communication: No one at bedside Level of care: Telemetry   Vitals:   06/14/23 0500 06/14/23 0616 06/14/23 0802 06/14/23 1002  BP:  (!) 164/70 (!) 155/69 132/85  Pulse:  71 72 73  Resp:   16   Temp:  98 F (36.7 C) 98 F (36.7 C)   TempSrc:  Oral Oral   SpO2:  99% 100%   Weight: 64.8 kg     Height:         Author: Lynden Oxford, MD 06/14/2023 6:06 PM  Please look on [email or web address] to find out who is on call.

## 2023-06-15 ENCOUNTER — Other Ambulatory Visit (HOSPITAL_COMMUNITY): Payer: Medicare (Managed Care)

## 2023-06-15 ENCOUNTER — Inpatient Hospital Stay (HOSPITAL_COMMUNITY): Payer: Medicare (Managed Care)

## 2023-06-15 ENCOUNTER — Encounter (HOSPITAL_COMMUNITY)
Admission: EM | Disposition: A | Discharge status: Home Health | Payer: Self-pay | Source: Home / Self Care | Attending: Internal Medicine

## 2023-06-15 ENCOUNTER — Encounter (HOSPITAL_COMMUNITY): Payer: Self-pay | Admitting: Registered Nurse

## 2023-06-15 ENCOUNTER — Inpatient Hospital Stay (HOSPITAL_COMMUNITY): Payer: Medicare (Managed Care) | Admitting: Registered Nurse

## 2023-06-15 DIAGNOSIS — R7881 Bacteremia: Secondary | ICD-10-CM

## 2023-06-15 DIAGNOSIS — M25552 Pain in left hip: Secondary | ICD-10-CM | POA: Diagnosis not present

## 2023-06-15 DIAGNOSIS — Q2112 Patent foramen ovale: Secondary | ICD-10-CM

## 2023-06-15 HISTORY — PX: TRANSESOPHAGEAL ECHOCARDIOGRAM (CATH LAB): EP1270

## 2023-06-15 LAB — MAGNESIUM: Magnesium: 1.8 mg/dL (ref 1.7–2.4)

## 2023-06-15 LAB — CBC
HCT: 40.4 % (ref 39.0–52.0)
Hemoglobin: 12.3 g/dL — ABNORMAL LOW (ref 13.0–17.0)
MCH: 32.6 pg (ref 26.0–34.0)
MCHC: 30.4 g/dL (ref 30.0–36.0)
MCV: 107.2 fL — ABNORMAL HIGH (ref 80.0–100.0)
Platelets: 211 10*3/uL (ref 150–400)
RBC: 3.77 MIL/uL — ABNORMAL LOW (ref 4.22–5.81)
RDW: 14.5 % (ref 11.5–15.5)
WBC: 17.4 10*3/uL — ABNORMAL HIGH (ref 4.0–10.5)
nRBC: 0.2 % (ref 0.0–0.2)

## 2023-06-15 LAB — BASIC METABOLIC PANEL
Anion gap: 11 (ref 5–15)
BUN: 40 mg/dL — ABNORMAL HIGH (ref 8–23)
CO2: 25 mmol/L (ref 22–32)
Calcium: 8.6 mg/dL — ABNORMAL LOW (ref 8.9–10.3)
Chloride: 92 mmol/L — ABNORMAL LOW (ref 98–111)
Creatinine, Ser: 0.75 mg/dL (ref 0.61–1.24)
GFR, Estimated: >60 mL/min (ref >60)
Glucose, Bld: 305 mg/dL — ABNORMAL HIGH (ref 70–99)
Potassium: 4.6 mmol/L (ref 3.5–5.1)
Sodium: 128 mmol/L — ABNORMAL LOW (ref 135–145)

## 2023-06-15 LAB — ECHO TEE

## 2023-06-15 LAB — GLUCOSE, CAPILLARY: Glucose-Capillary: 190 mg/dL — ABNORMAL HIGH (ref 70–99)

## 2023-06-15 SURGERY — TRANSESOPHAGEAL ECHOCARDIOGRAM (TEE) (CATHLAB)
Anesthesia: Monitor Anesthesia Care

## 2023-06-15 MED ORDER — CEFAZOLIN SODIUM-DEXTROSE 2-4 GM/100ML-% IV SOLN
2.0000 g | Freq: Three times a day (TID) | INTRAVENOUS | Status: DC
  Administered 2023-06-15 – 2023-06-18 (×9): 2 g via INTRAVENOUS
  Filled 2023-06-15 (×11): qty 100

## 2023-06-15 MED ORDER — PROPOFOL 10 MG/ML IV BOLUS
INTRAVENOUS | Status: DC | PRN
  Administered 2023-06-15: 100 ug/kg/min via INTRAVENOUS
  Administered 2023-06-15: 50 mg via INTRAVENOUS

## 2023-06-15 MED ORDER — SODIUM CHLORIDE 0.9% FLUSH: 10.0000 mL | Freq: Two times a day (BID) | INTRAVENOUS | Status: DC

## 2023-06-15 MED ORDER — SODIUM CHLORIDE 0.9 % IV SOLN: INTRAVENOUS | Status: DC | PRN

## 2023-06-15 NOTE — Progress Notes (Signed)
[facility]  Date of Admission:  06/09/2023   Total days of inpatient antibiotics 4  Principal Problem:   Pain of left hip joint Active Problems:   Left lower extremity PVD status post stent placement x 5 04/24/2023   History of cerebrovascular accident (CVA) with residual deficit   BPH (benign prostatic hyperplasia)   Hyperlipidemia   Chronic diastolic heart failure (HCC)   Chronic hyponatremia   Malnutrition of moderate degree   Swelling of left lower extremity   History of CAD (coronary artery disease)   Cellulitis and abscess of left lower extremity          Assessment: 77 year old male with history of PAD s/p angioplasty/stenting on 04/24/2023, diastolic heart failure, CVA with left-sided hemiparesis, requires wheelchair to ambulate admitted with: #MSSA bacteremia likely secondary to left leg cellulitis #Poor dentition, no recent  dental procedures - Patient reports that he had a fall about a week ago ever since then his hip and leg has been hurting. - He states he has been doing okay since the stenting until the fall. - On arrival patient had white cell count 19K.  CT foot, tib-fib, femur showed marked soft tissue swelling calf extending to thigh and first dorsal foot nonspecific but correlation of cellulitis recommended.  MRI hip did not show signs of prosthetic material compromise. - Found to have MSSA bacteremia.  He does have poor dentition as well. - CT maxillofacial showed dental caries, recommended to see follow-up - TTE showed aortic valve thickening, will need TEE - Cellulitis appears to be improved if not resolved Recommendations: -Follow repeat blood cultures to ensure clearance - Continue cefazolin - Pending TEE can decide on midline versus PICC line due to antibiotic course - TEE  Microbiology:   Antibiotics: Cefazolin Cultures: Blood 11/27 2/2 MSSA 11/29 no growth Urine  Other   SUBJECTIVE: Resting in bed  eating breakfast Interval: Afebrile overnight   Review of Systems: Review of Systems  All other systems reviewed and are negative.    Scheduled Meds:  (feeding supplement) PROSource Plus  30 mL Oral BID   acetaminophen  1,000 mg Oral BID   aspirin EC  81 mg Oral Daily   clopidogrel  75 mg Oral Daily   enoxaparin (LOVENOX) injection  40 mg Subcutaneous Q24H   feeding supplement  237 mL Oral TID BM   furosemide  20 mg Oral Daily   labetalol  200 mg Oral BID   methocarbamol  500 mg Oral TID   multivitamin with minerals  1 tablet Oral Daily   pantoprazole  40 mg Oral Daily   polyethylene glycol  17 g Oral Daily   predniSONE  50 mg Oral Q breakfast   Ensure Max Protein  11 oz Oral BID   rosuvastatin  20 mg Oral Daily   senna-docusate  2 tablet Oral BID   tamsulosin  0.4 mg Oral Daily   Continuous Infusions:   ceFAZolin (ANCEF) IV 2 g (06/15/23 0615)   PRN Meds:.acetaminophen **OR** acetaminophen, levalbuterol, morphine **OR** morphine injection, ondansetron **OR** ondansetron (ZOFRAN) IV, mouth rinse, tiZANidine Allergies  Allergen Reactions   Amitriptyline Hcl Hypertension   Other Shortness Of Breath and Other (See Comments)    SKIN is sensitive- Guards for the underwear MUST be removed immediately after using  Anesthesia- "They almost could not get me back" (HAS TO TAKE SPIRONOLACTONE NOW)   Losartan Potassium Other (See Comments)    Hypotension  Papaya Derivatives Itching and Other (See Comments)    Itching of the throat   Sulfa Antibiotics Other (See Comments)    Constipation     OBJECTIVE: Vitals:   06/14/23 1944 06/14/23 2154 06/15/23 0439 06/15/23 0500  BP: 138/69 138/69 (!) 154/64   Pulse: 75 75 77   Resp: 18  16   Temp: (!) 97.5 F (36.4 C)  (!) 97.3 F (36.3 C)   TempSrc: Oral  Oral   SpO2: 98%  99%   Weight:    63.5 kg  Height:       Body mass index is 21.93 kg/m.  Physical Exam Constitutional:      General: He is not in acute distress.     Appearance: He is normal weight. He is not toxic-appearing.  HENT:     Head: Normocephalic and atraumatic.     Right Ear: External ear normal.     Left Ear: External ear normal.     Nose: No congestion or rhinorrhea.     Mouth/Throat:     Mouth: Mucous membranes are moist.     Pharynx: Oropharynx is clear.  Eyes:     Extraocular Movements: Extraocular movements intact.     Conjunctiva/sclera: Conjunctivae normal.     Pupils: Pupils are equal, round, and reactive to light.  Cardiovascular:     Rate and Rhythm: Normal rate and regular rhythm.     Heart sounds: No murmur heard.    No friction rub. No gallop.  Pulmonary:     Effort: Pulmonary effort is normal.     Breath sounds: Normal breath sounds.  Abdominal:     General: Abdomen is flat. Bowel sounds are normal.     Palpations: Abdomen is soft.  Musculoskeletal:        General: No swelling.     Cervical back: Normal range of motion and neck supple.  Skin:    General: Skin is warm and dry.  Neurological:     General: No focal deficit present.     Mental Status: He is oriented to person, place, and time.  Psychiatric:        Mood and Affect: Mood normal.       Lab Results Lab Results  Component Value Date   WBC 15.2 (H) 06/14/2023   HGB 11.4 (L) 06/14/2023   HCT 34.9 (L) 06/14/2023   MCV 101.7 (H) 06/14/2023   PLT 186 06/14/2023    Lab Results  Component Value Date   CREATININE 0.85 06/14/2023   BUN 38 (H) 06/14/2023   NA 126 (L) 06/14/2023   K 5.2 (H) 06/14/2023   CL 92 (L) 06/14/2023   CO2 26 06/14/2023    Lab Results  Component Value Date   ALT 66 (H) 06/10/2023   AST 64 (H) 06/10/2023   ALKPHOS 58 06/10/2023   BILITOT 1.0 06/10/2023        Danelle Earthly, MD [facility] 06/15/2023, 7:32 AM I have personally spent 52 minutes involved in face-to-face and non-face-to-face activities for this patient on the day of the visit. Professional time spent  includes the following activities: Preparing to see the patient (review of tests), Obtaining and/or reviewing separately obtained history (admission/discharge record), Performing a medically appropriate examination and/or evaluation , Ordering medications/tests/procedures, referring and communicating with other health care professionals, Documenting clinical information in the EMR, Independently interpreting results (not separately reported), Communicating results to the patient/family/caregiver, Counseling and educating the patient/family/caregiver and Care  coordination (not separately reported).

## 2023-06-15 NOTE — Op Note (Signed)
INDICATIONS: Bacteremia  PROCEDURE:   Informed consent was obtained prior to the procedure. The risks, benefits and alternatives for the procedure were discussed and the patient comprehended these risks.  Risks include, but are not limited to, cough, sore throat, vomiting, nausea, somnolence, esophageal and stomach trauma or perforation, bleeding, low blood pressure, aspiration, pneumonia, infection, trauma to the teeth and death.    After a procedural time-out, the oropharynx was anesthetized with 20% benzocaine spray.   During this procedure the patient was administered IV propofol by Anesthesiology.  The transesophageal probe was inserted in the esophagus and stomach without difficulty and multiple views were obtained.  The patient was kept under observation until the patient left the procedure room.  The patient left the procedure room in stable condition.   Agitated microbubble saline contrast was administered.  COMPLICATIONS:    There were no immediate complications.  FINDINGS:  No vegetations or other signs of endocarditis were seen. There is moderate dilation of the ascending aorta. There is a small PFO with bidirectional shunt. Otherwise normal TEE.   Time Spent Directly with the Patient:  30 minutes   Howard Jackson 06/15/2023, 3:23 PM

## 2023-06-15 NOTE — TOC Progression Note (Signed)
Transition of Care [facility]) - Progression Note    Patient Details  Name: Howard Jackson MRN: [id number] Date of Birth: July 23, 1945  Transition of Care [facility]) CM/SW Contact  Howell Rucks, RN Phone Number: 06/15/2023, 11:27 AM  Clinical Narrative:   Call received from [place], CM with [facility], updated provided, will continue to update on progress/dc planning.     Expected Discharge Plan: Home w Home Health Services    Expected Discharge Plan and Services       Living arrangements for the past 2 months: Apartment                                       Social Determinants of Health (SDOH) Interventions SDOH Screenings   Food Insecurity: No Food Insecurity (06/10/2023)  Housing: Low Risk  (06/10/2023)  Transportation Needs: No Transportation Needs (06/10/2023)  Utilities: Not At Risk (06/10/2023)  Social Connections: Unknown (11/25/2021)   Received from [facility]  Tobacco Use: Low Risk  (06/10/2023)    Readmission Risk Interventions    06/10/2023   10:26 AM 02/16/2023    3:07 PM  Readmission Risk Prevention Plan  Transportation Screening Complete Complete  PCP or Specialist Appt within 5-7 Days Complete   PCP or Specialist Appt within 3-5 Days  Complete  Home Care Screening Complete   Medication Review (RN CM) Complete   HRI or Home Care Consult  Complete  Social Work Consult for Recovery Care Planning/Counseling  Complete  Palliative Care Screening  Not Applicable  Medication Review [employer])  Complete

## 2023-06-15 NOTE — Transfer of Care (Signed)
Immediate Anesthesia Transfer of Care Note  Patient: Howard Jackson  Procedure(s) Performed: TRANSESOPHAGEAL ECHOCARDIOGRAM  Patient Location: PACU and Cath Lab  Anesthesia Type:MAC  Level of Consciousness: drowsy and patient cooperative  Airway & Oxygen Therapy: Patient Spontanous Breathing  Post-op Assessment: Report given to RN and Post -op Vital signs reviewed and stable  Post vital signs: Reviewed and stable  Last Vitals:  Vitals Value Taken Time  BP 94/72   Temp    Pulse 94   Resp 12   SpO2 97     Last Pain:  Vitals:   06/15/23 1446  TempSrc: Temporal  PainSc:       Patients Stated Pain Goal: 2 (06/14/23 2154)  Complications: There were no known notable events for this encounter.

## 2023-06-15 NOTE — Anesthesia Postprocedure Evaluation (Signed)
Anesthesia Post Note  Patient: Howard Jackson  Procedure(s) Performed: TRANSESOPHAGEAL ECHOCARDIOGRAM     Patient location during evaluation: Cath Lab Anesthesia Type: MAC Level of consciousness: oriented, awake and alert and awake Pain management: pain level controlled Vital Signs Assessment: post-procedure vital signs reviewed and stable Respiratory status: spontaneous breathing, nonlabored ventilation, respiratory function stable and patient connected to nasal cannula oxygen Cardiovascular status: blood pressure returned to baseline and stable Postop Assessment: no headache, no backache and no apparent nausea or vomiting Anesthetic complications: no   There were no known notable events for this encounter.  Last Vitals:  Vitals:   06/15/23 1533 06/15/23 1618  BP: 120/77 137/74  Pulse:  81  Resp: 15   Temp:  37.1 C  SpO2: 94% 98%    Last Pain:  Vitals:   06/15/23 1618  TempSrc: Oral  PainSc:                  Collene Schlichter

## 2023-06-15 NOTE — Interval H&P Note (Signed)
History and Physical Interval Note:  06/15/2023 11:26 AM  Howard Jackson  has presented today for surgery, with the diagnosis of bacteremia.  The various methods of treatment have been discussed with the patient and family. After consideration of risks, benefits and other options for treatment, the patient has consented to  Procedure(s): TRANSESOPHAGEAL ECHOCARDIOGRAM (N/A) as a surgical intervention.  The patient's history has been reviewed, patient examined, no change in status, stable for surgery.  I have reviewed the patient's chart and labs.  Questions were answered to the patient's satisfaction.     Chuong Casebeer

## 2023-06-15 NOTE — Progress Notes (Addendum)
   [facility] has been requested to perform a transesophageal echocardiogram on Howard Jackson for MSSA bacteremia.     The patient does NOT have any absolute or relative contraindications to a Transesophageal Echocardiogram (TEE). Note cervical fusion in the past, denied any neck mobility issue.   The patient has: No other conditions that may impact this procedure.   After careful review of history and examination, the risks and benefits of transesophageal echocardiogram have been explained including risks of esophageal damage, perforation (1:10,000 risk), bleeding, pharyngeal hematoma as well as other potential complications associated with conscious sedation including aspiration, arrhythmia, respiratory failure and death. Alternatives to treatment were discussed, questions were answered. Patient is willing to proceed after talking to his daughter.   TEE orders placed, maintain NPO, he will be transferred to [facility] for the procedure today. Primary team and nurse informed with above plan.      Signed, Cyndi Bender, NP  06/15/2023 1:00 PM

## 2023-06-15 NOTE — Inpatient Diabetes Management (Signed)
Inpatient Diabetes Program Recommendations  AACE/ADA: New Consensus Statement on Inpatient Glycemic Control (2015)  Target Ranges:  Prepandial:   less than 140 mg/dL      Peak postprandial:   less than 180 mg/dL (1-2 hours)      Critically ill patients:  140 - 180 mg/dL   Lab Results  Component Value Date   GLUCAP 122 (H) 12/19/2022   HGBA1C 5.5 05/22/2016    Review of Glycemic Control  Latest Reference Range & Units 06/10/23 06:05 06/12/23 05:35 06/13/23 05:19 06/14/23 04:58 06/15/23 09:36  Glucose 70 - 99 mg/dL 409 (H) 811 (H) 914 (H) 278 (H) 305 (H)   Diabetes history: None  Current orders for Inpatient glycemic control:  None PO prednisone 50 mg Daily Ensure enlive tid between meals (40 grams of carbohydrate)  Inpatient Diabetes Program Recommendations:    -   Novolog 0-9 units tid + hs -   Novolog 2 units tid meal coverage if eating >50% of meals  Thanks,  Christena Deem RN, MSN, BC-ADM Inpatient Diabetes Coordinator Team Pager [phone] (8a-5p)

## 2023-06-15 NOTE — Progress Notes (Signed)
[facility] Progress Note Patient: Howard Jackson ZOX:[id number] DOB: 07/09/1946 DOA: 06/09/2023  DOS: the patient was seen and examined on 06/15/2023  Brief hospital course: PMH of CAD, HTN, HLD, CVA with chronic left-sided hemiparesis and contracture, PAD with recent stenting in October, giant cell arteritis on chronic prednisone, chronic HFpEF. Presented with complaints of left-sided pain as well as swelling. Workup negative for any fracture or abscess or septic arthritis. Currently being treated with cellulitis and bacteremia with MSSA. ID consulted.  TEE negative for endocarditis.  Assessment and Plan: Left leg cellulitis with MSSA bacteremia. Left-sided hip joint pain Concern for septic arthritis or abscess ruled out. Presents with left leg pain. CT scan unremarkable. MRI negative for any joint issues.  Or abscess. No fractures. IV Cefazolin. Blood Cultures Positive for MSSA. TTE and TEE negative for echocardiogram.  EF normal. Discussed with cardiology.  Normal EF. Repeat Cultures performed.  So far negative.  ID Following.  Small PFO. Incidental finding on TEE. Outpatient follow-up with cardiology.  Dilated ascending aorta. Again incidental finding on TEE. Outpatient follow-up with cardiology.  History of temporal arteritis. Has been on prednisone since June 24, 60 mg daily.  Did not receive any biopsy. I do not think that he requires still high dose right now. Will continue same dose and monitor. Recommend follow-up with vascular surgery and neurology.   History of CVA with left-sided hemiplegia Continue aspirin 81 mg daily, Plavix 75 mg daily and Crestor 20 mg daily   Chronic hyponatremia Continue to monitor.   History of diastolic heart failure Essential hypertension Blood pressure stable.  On labetalol.  Responded well to Lasix and Aldactone.  History of CAD Continue aspirin and statin.   Reactive airway disease Continue Xopenex as needed for  wheezing or shortness of breath.   Nonsustained V. tach Continue labetalol. Telemetry so far negative for any NSVT. Discontinue telemetry  BPH Continue tamsulosin 0.4 mg at bedtime  Constipation. Continue bowel regimen.  Moderate protein calorie malnutrition. Interventions: Premier Protein, MVI Body mass index is 22.37 kg/m.  Continue supplementation.  Primary hyperaldosteronism. Was on Aldactone. Currently on hold due to hyperkalemia.   Subjective: Breathing okay.  No nausea no vomiting no fever no chills no chest pain.  Physical Exam: General: in Mild distress, No Rash Cardiovascular: S1 and S2 Present, No Murmur Respiratory: Good respiratory effort, Bilateral Air entry present.  Occasional crackles, No wheezes Abdomen: Bowel Sound present, No tenderness Extremities: No edema Neuro: Alert and oriented x3, no new focal deficit, chronic left upper extremity contracture as well as left-sided weakness.  Data Reviewed: I have Reviewed nursing notes, Vitals, and Lab results. Since last encounter, pertinent lab results CBC and CMP   . I have ordered test including CBC and BMP  . I have discussed pt's care plan and test results with cardiology  .   Disposition: Status is: Inpatient Remains inpatient appropriate because: Monitor for improvement in oxygenation on further workup for infection  enoxaparin (LOVENOX) injection 40 mg Start: 06/10/23 1400 SCDs Start: 06/10/23 0208 Place TED hose Start: 06/10/23 0208   Family Communication: No one at bedside Level of care: Med-Surg switch from telemetry to MedSurg. Vitals:   06/15/23 1500 06/15/23 1519 06/15/23 1533 06/15/23 1618  BP: 136/67 113/77 120/77 137/74  Pulse:    81  Resp: 14 14 15    Temp:  98.1 F (36.7 C)  98.7 F (37.1 C)  TempSrc:    Oral  SpO2:  95% 94% 98%  Weight:  Height:         Author: Lynden Oxford, MD 06/15/2023 5:35 PM  Please look on [email or web address] to find out who is on call.

## 2023-06-15 NOTE — Plan of Care (Signed)
  Problem: Education: Goal: Knowledge of General Education information will improve Description: Including pain rating scale, medication(s)/side effects and non-pharmacologic comfort measures Outcome: Progressing   Problem: Clinical Measurements: Goal: Diagnostic test results will improve Outcome: Progressing   Problem: Coping: Goal: Level of anxiety will decrease Outcome: Progressing   Problem: Pain Management: Goal: General experience of comfort will improve Outcome: Progressing

## 2023-06-15 NOTE — Progress Notes (Signed)
PT Cancellation Note  Patient Details Name: Howard Jackson MRN: [id number] DOB: 1945-11-02   Cancelled Treatment:    Reason Eval/Treat Not Completed: Patient at procedure or test/unavailable   [facility] 06/15/2023, 1:58 PM

## 2023-06-15 NOTE — Anesthesia Preprocedure Evaluation (Signed)
Anesthesia Evaluation  Patient identified by MRN, date of birth, ID band Patient awake    Reviewed: Allergy & Precautions, NPO status , Patient's Chart, lab work & pertinent test results  Airway Mallampati: III  TM Distance: >3 FB Neck ROM: Full    Dental  (+) Teeth Intact, Dental Advisory Given, Poor Dentition   Pulmonary asthma    breath sounds clear to auscultation       Cardiovascular hypertension, + CAD, + Past MI, + Cardiac Stents and + Peripheral Vascular Disease   Rhythm:Regular Rate:Normal  Echo: 2. Left ventricular ejection fraction, by estimation, is 60 to 65%. The  left ventricle has normal function. The left ventricle has no regional  wall motion abnormalities. Left ventricular diastolic parameters are  indeterminate.   3. Right ventricular systolic function is normal. The right ventricular  size is normal. There is normal pulmonary artery systolic pressure.   4. The mitral valve is normal in structure. No evidence of mitral valve  regurgitation. No evidence of mitral stenosis.   5. The aortic valve is calcified. There is moderate calcification of the  aortic valve. There is mild thickening of the aortic valve. Aortic valve  regurgitation is not visualized. No aortic stenosis is present.   6. The inferior vena cava is normal in size with greater than 50%  respiratory variability, suggesting right atrial pressure of 3 mmHg.      Neuro/Psych  Neuromuscular disease CVA, Residual Symptoms  negative psych ROS   GI/Hepatic Neg liver ROS, hiatal hernia,,,  Endo/Other  negative endocrine ROS    Renal/GU negative Renal ROS     Musculoskeletal  (+) Arthritis ,    Abdominal   Peds  Hematology negative hematology ROS (+)   Anesthesia Other Findings   Reproductive/Obstetrics                             Anesthesia Physical Anesthesia Plan  ASA: 3  Anesthesia Plan: MAC   Post-op  Pain Management: Minimal or no pain anticipated   Induction:   PONV Risk Score and Plan: 0  Airway Management Planned: Natural Airway and Nasal Cannula  Additional Equipment: None  Intra-op Plan:   Post-operative Plan:   Informed Consent: I have reviewed the patients History and Physical, chart, labs and discussed the procedure including the risks, benefits and alternatives for the proposed anesthesia with the patient or authorized representative who has indicated his/her understanding and acceptance.       Plan Discussed with: CRNA  Anesthesia Plan Comments:        Anesthesia Quick Evaluation

## 2023-06-16 ENCOUNTER — Encounter (HOSPITAL_COMMUNITY): Payer: Self-pay | Admitting: Cardiovascular Disease

## 2023-06-16 DIAGNOSIS — B9561 Methicillin susceptible Staphylococcus aureus infection as the cause of diseases classified elsewhere: Secondary | ICD-10-CM

## 2023-06-16 DIAGNOSIS — R7881 Bacteremia: Secondary | ICD-10-CM

## 2023-06-16 DIAGNOSIS — M25552 Pain in left hip: Secondary | ICD-10-CM | POA: Diagnosis not present

## 2023-06-16 DIAGNOSIS — L03116 Cellulitis of left lower limb: Secondary | ICD-10-CM | POA: Diagnosis not present

## 2023-06-16 LAB — CBC
HCT: 39.8 % (ref 39.0–52.0)
Hemoglobin: 12.9 g/dL — ABNORMAL LOW (ref 13.0–17.0)
MCH: 33.2 pg (ref 26.0–34.0)
MCHC: 32.4 g/dL (ref 30.0–36.0)
MCV: 102.6 fL — ABNORMAL HIGH (ref 80.0–100.0)
Platelets: 207 10*3/uL (ref 150–400)
RBC: 3.88 MIL/uL — ABNORMAL LOW (ref 4.22–5.81)
RDW: 14.6 % (ref 11.5–15.5)
WBC: 13.7 10*3/uL — ABNORMAL HIGH (ref 4.0–10.5)
nRBC: 0.4 % — ABNORMAL HIGH (ref 0.0–0.2)

## 2023-06-16 LAB — BASIC METABOLIC PANEL
Anion gap: 9 (ref 5–15)
BUN: 34 mg/dL — ABNORMAL HIGH (ref 8–23)
CO2: 26 mmol/L (ref 22–32)
Calcium: 8.4 mg/dL — ABNORMAL LOW (ref 8.9–10.3)
Chloride: 90 mmol/L — ABNORMAL LOW (ref 98–111)
Creatinine, Ser: 0.47 mg/dL — ABNORMAL LOW (ref 0.61–1.24)
GFR, Estimated: >60 mL/min (ref >60)
Glucose, Bld: 169 mg/dL — ABNORMAL HIGH (ref 70–99)
Potassium: 4.2 mmol/L (ref 3.5–5.1)
Sodium: 125 mmol/L — ABNORMAL LOW (ref 135–145)

## 2023-06-16 LAB — MAGNESIUM: Magnesium: 1.7 mg/dL (ref 1.7–2.4)

## 2023-06-16 MED ORDER — GUAIFENESIN-DM 100-10 MG/5ML PO SYRP
10.0000 mL | ORAL_SOLUTION | Freq: Four times a day (QID) | ORAL | Status: DC
  Administered 2023-06-16 – 2023-06-18 (×6): 10 mL via ORAL
  Filled 2023-06-16 (×7): qty 10

## 2023-06-16 MED ORDER — MAGNESIUM HYDROXIDE 400 MG/5ML PO SUSP
15.0000 mL | Freq: Every day | ORAL | Status: DC
  Administered 2023-06-16 – 2023-06-17 (×2): 15 mL via ORAL
  Filled 2023-06-16 (×3): qty 30

## 2023-06-16 NOTE — Progress Notes (Signed)
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 Presented with complaints of left-sided pain as well as swelling. Workup negative for any fracture or abscess or septic arthritis. Currently being treated with cellulitis and bacteremia with MSSA. ID consulted.  TEE negative for endocarditis.  Assessment and Plan: Left leg cellulitis with MSSA bacteremia. Left-sided hip joint pain Concern for septic arthritis or abscess ruled out. Presents with left leg pain. CT scan unremarkable. MRI negative for any joint issues.  Or abscess. No fractures. IV Cefazolin. Blood Cultures Positive for MSSA. TTE and TEE negative for echocardiogram.  EF normal. Discussed with cardiology.  Normal EF. Repeat Cultures performed.  So far negative.  ID Following. Midline ordered.  Plan for antibiotic, EOT 12/12.  Follow-up outpatient 12/17.  Small PFO. Incidental finding on TEE. Outpatient follow-up with cardiology.  Dilated ascending aorta. Again incidental finding on TEE. Outpatient follow-up with cardiology.  History of temporal arteritis. Has been on prednisone since June 24, 60 mg daily.  Did not receive any biopsy. I do not think that he requires still high dose right now. Will continue same dose and monitor. Recommend follow-up with vascular surgery and neurology. Recommend at least biweekly taper.   History of CVA with left-sided hemiplegia Continue aspirin 81 mg daily, Plavix 75 mg daily and Crestor 20 mg daily   Chronic hyponatremia Continue to monitor.  Diet changed.  Patient will not take oral salt tablets.  Recommend oral supplements.   History of diastolic heart failure Essential  hypertension Blood pressure stable.  On labetalol.  Responded well to Lasix and Aldactone.  History of CAD Continue aspirin and statin.   Reactive airway disease Continue Xopenex as needed for wheezing or shortness of breath.   Nonsustained V. tach Continue labetalol. Telemetry so far negative for any NSVT. Discontinue telemetry  BPH Continue tamsulosin 0.4 mg at bedtime  Constipation. Continue bowel regimen.  Moderate protein calorie malnutrition. Interventions: Premier Protein, MVI Body mass index is 22.37 kg/m.  Continue supplementation.  Primary hyperaldosteronism. Was on Aldactone. Currently on hold due to hyperkalemia.   Subjective: No nausea no vomiting no fever no chills.  Ongoing cough.  No shortness of breath.  No chest pain.  Physical Exam: General: in Mild distress, No Rash Cardiovascular: S1 and S2 Present, No Murmur Respiratory: Good respiratory effort, Bilateral Air entry present.  Upper airway crackles, No wheezes Abdomen: Bowel Sound present, No tenderness Extremities: No edema Neuro: Alert and oriented x3, no new focal deficit, chronic left-sided contracture  Data Reviewed: I have Reviewed nursing notes, Vitals, and Lab results. Since last encounter, pertinent lab results CBC and BMP   . I have ordered test including CBC and BMP  .   Disposition: Status is: Inpatient Remains inpatient appropriate because: Monitor for improvement in sodium level.  Likely can go back to SNF tomorrow.  enoxaparin (LOVENOX) injection 40 mg Start: 06/10/23 1400 SCDs Start: 06/10/23 0208 Place TED hose Start: 06/10/23 0208   Family Communication: No one at bedside Level of care: Med-Surg   Vitals:   06/16/23 1105 06/16/23 1114 06/16/23 1159 06/16/23 1328  BP: (!) 153/78   124/70  Pulse:    91  Resp:  19 17 17   Temp:    98.6 F (  37 C)  TempSrc:    Oral  SpO2:    97%  Weight:      Height:         Author: Lynden Oxford, MD 06/16/2023 7:35 PM  Please look  on [email or web address] to find out who is on call.

## 2023-06-16 NOTE — Progress Notes (Signed)
PHARMACY CONSULT NOTE FOR:  OUTPATIENT  PARENTERAL ANTIBIOTIC THERAPY (OPAT)  Indication: MSSA bacteremia Regimen: Cefazolin 2 gm IV Q 8 hours End date: 06/26/23  IV antibiotic discharge orders are pended. To discharging provider:  please sign these orders via discharge navigator,  Select New Orders & click on the button choice - Manage This Unsigned Work.     Thank you for allowing pharmacy to be a part of this patient's care.  Sharin Mons, PharmD, BCPS, BCIDP Infectious Diseases Clinical Pharmacist Phone: [phone] 06/16/2023, 3:00 PM

## 2023-06-16 NOTE — Progress Notes (Signed)
Physical Therapy Treatment Patient Details Name: Howard Jackson MRN: [id number] DOB: 1945-12-03 Today's Date: 06/16/2023   History of Present Illness 77 y.o. male admitted 06/10/23 with LLE cellulitis with MSSA bacteremia. 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    PT Comments  Max assist for supine to sit, multiple rest breaks required 2* LLE pain. Pt sat edge of bed x 14 minutes. He did not feel he could tolerate attempting to stand on RLE 2* severe LLE pain. Pt puts forth good effort. Patient will benefit from continued inpatient follow up therapy, <3 hours/day.      If plan is discharge home, recommend the following: Two people to help with walking and/or transfers;A lot of help with bathing/dressing/bathroom;Assistance with cooking/housework;Assist for transportation;Help with stairs or ramp for entrance   Can travel by private vehicle     No  Equipment Recommendations  None recommended by PT    Recommendations for Other Services       Precautions / Restrictions Precautions Precautions: Fall Precaution Comments: L hemiplegia Restrictions Weight Bearing Restrictions: No     Mobility  Bed Mobility Overal bed mobility: Needs Assistance Bed Mobility: Supine to Sit, Sit to Supine     Supine to sit: Max assist, HOB elevated, Used rails Sit to supine: +2 for physical assistance, Total assist   General bed mobility comments: required 3 attempts to successfully come from supine to sit, max assist to advance LLE, raise trunk, and pivot hips; all movement with increased time 2* pain; pt sat unsupported at EOB ~14 minutes. Activity tolerance limited by L hip and L 2nd toe pain. Pt puts forth good effort.    Transfers                   General transfer comment: unable    Ambulation/Gait                    Stairs             Wheelchair Mobility     Tilt Bed    Modified Rankin (Stroke Patients Only)       Balance Overall balance assessment: Needs assistance Sitting-balance support: Feet supported Sitting balance-Leahy Scale: Fair Sitting balance - Comments: requires single UE support for dynamic sitting balance                                    Cognition Arousal: Alert Behavior During Therapy: WFL for tasks assessed/performed Overall Cognitive Status: No family/caregiver present to determine baseline cognitive functioning                                 General Comments: appears intact        Exercises General Exercises - Lower Extremity Ankle Circles/Pumps: AROM, Right, 5 reps, Seated Long Arc Quad: AROM, Right, 5 reps, Seated    General Comments        Pertinent Vitals/Pain Pain Assessment Pain Score: 9  Pain Location: L hip and 2nd toe Pain Descriptors / Indicators: Grimacing, Guarding, Moaning, Constant Pain Intervention(s): Limited activity within patient's tolerance, Monitored during session, RN gave pain meds during session, Repositioned    Home Living  Prior Function            PT Goals (current goals can now be found in the care plan section) Acute Rehab PT Goals Patient Stated Goal: to be able to transfer to [facility] and to toilet PT Goal Formulation: With patient Time For Goal Achievement: 06/26/23 Potential to Achieve Goals: Fair Progress towards PT goals: Progressing toward goals    Frequency    Min 1X/week      PT Plan      Co-evaluation              AM-PAC PT "6 Clicks" Mobility   Outcome Measure  Help needed turning from your back to your side while in a flat bed without using bedrails?: A Lot Help needed moving from lying on your back to sitting on the side of a flat bed without using bedrails?: Total Help needed moving to and from a bed to a  chair (including a wheelchair)?: Total Help needed standing up from a chair using your arms (e.g., wheelchair or bedside chair)?: Total Help needed to walk in hospital room?: Total Help needed climbing 3-5 steps with a railing? : Total 6 Click Score: 7    End of Session Equipment Utilized During Treatment: Gait belt Activity Tolerance: Patient limited by fatigue;Patient limited by pain Patient left: in bed;with call bell/phone within reach;with bed alarm set;with nursing/sitter in room Nurse Communication: Mobility status;Need for lift equipment PT Visit Diagnosis: Hemiplegia and hemiparesis;Muscle weakness (generalized) (M62.81);Other abnormalities of gait and mobility (R26.89);Pain Hemiplegia - Right/Left: Left Hemiplegia - dominant/non-dominant: Non-dominant Pain - Right/Left: Left Pain - part of body: Hip     Time: 6213-0865 PT Time Calculation (min) (ACUTE ONLY): 27 min  Charges:    $Therapeutic Activity: 23-37 mins PT General Charges $$ ACUTE PT VISIT: 1 Visit                     Tamala Ser PT 06/16/2023  Acute Rehabilitation Services  Office [phone]

## 2023-06-16 NOTE — TOC Progression Note (Signed)
Transition of Care [facility]) - Progression Note    Patient Details  Name: Howard Jackson MRN: [id number] Date of Birth: 07/31/1945  Transition of Care [facility]) CM/SW Contact  Howell Rucks, RN Phone Number: 06/16/2023, 3:30 PM  Clinical Narrative:   Teams chat received from attending, anticipate dc to SNF tomorrow 12/4. Call to [facility], CM with [facility] to notify, no answer, vm left with NCM name and phone number requesting call back. Call to Wynona Luna, admissions coordinator w/[facility], no answer, vm left with NCM name and phone number requesting call back,     Expected Discharge Plan: Home w Home Health Services    Expected Discharge Plan and Services       Living arrangements for the past 2 months: Apartment                                       Social Determinants of Health (SDOH) Interventions SDOH Screenings   Food Insecurity: No Food Insecurity (06/10/2023)  Housing: Low Risk  (06/10/2023)  Transportation Needs: No Transportation Needs (06/10/2023)  Utilities: Not At Risk (06/10/2023)  Social Connections: Unknown (11/25/2021)   Received from [facility]  Tobacco Use: Low Risk  (06/10/2023)    Readmission Risk Interventions    06/10/2023   10:26 AM 02/16/2023    3:07 PM  Readmission Risk Prevention Plan  Transportation Screening Complete Complete  PCP or Specialist Appt within 5-7 Days Complete   PCP or Specialist Appt within 3-5 Days  Complete  Home Care Screening Complete   Medication Review (RN CM) Complete   HRI or Home Care Consult  Complete  Social Work Consult for Recovery Care Planning/Counseling  Complete  Palliative Care Screening  Not Applicable  Medication Review [employer])  Complete

## 2023-06-16 NOTE — Inpatient Diabetes Management (Signed)
Inpatient Diabetes Program Recommendations  AACE/ADA: New Consensus Statement on Inpatient Glycemic Control (  Target Ranges:  Prepandial:   less than 140 mg/dL      Peak postprandial:   less than 180 mg/dL (1-2 hours)      Critically ill patients:  140 - 180 mg/dL    Latest Reference Range & Units 06/15/23 14:46  Glucose-Capillary 70 - 99 mg/dL 784 (H)    Latest Reference Range & Units 06/09/23 17:00 06/10/23 06:05 06/12/23 05:35 06/13/23 05:19 06/14/23 04:58 06/15/23 09:36 06/16/23 05:17  Glucose 70 - 99 mg/dL 696 (H) 295 (H) 284 (H) 252 (H) 278 (H) 305 (H) 169 (H)   Review of Glycemic Control  Diabetes history: No Outpatient Diabetes medications: NA Current orders for Inpatient glycemic control: None; Prednisone 50 mg QAM  Inpatient Diabetes Program Recommendations:    Insulin: Due to hyperglycemia, please consider ordering CBGs AC&HS and Novolog 0-9  units TID with meals and Novolog 0-5 units at bedtime.  HbgA1C: Per chart, patient has been on prednisone since June 24 due to temporal arteritis. Question if patient has developed steroid induced diabetes.  Please consider ordering an A1C to evaluate glycemic control over the past 2-3 months.  Thanks, Orlando Penner, RN, MSN, CDCES Diabetes Coordinator Inpatient Diabetes Program [phone] (Team Pager from 8am to 5pm)

## 2023-06-16 NOTE — Plan of Care (Signed)
  Problem: Education: Goal: Knowledge of General Education information will improve Description: Including pain rating scale, medication(s)/side effects and non-pharmacologic comfort measures Outcome: Progressing   Problem: Pain Management: Goal: General experience of comfort will improve Outcome: Progressing   Problem: Safety: Goal: Ability to remain free from injury will improve Outcome: Progressing   Problem: Skin Integrity: Goal: Risk for impaired skin integrity will decrease Outcome: Progressing

## 2023-06-16 NOTE — Progress Notes (Signed)
[facility]  Date of Admission:  06/09/2023   Total days of inpatient antibiotics 4  Principal Problem:   Pain of left hip joint Active Problems:   Left lower extremity PVD status post stent placement x 5 04/24/2023   History of cerebrovascular accident (CVA) with residual deficit   BPH (benign prostatic hyperplasia)   Hyperlipidemia   Chronic diastolic heart failure (HCC)   Chronic hyponatremia   Malnutrition of moderate degree   Swelling of left lower extremity   History of CAD (coronary artery disease)   Cellulitis and abscess of left lower extremity   Bacteremia          Assessment: 77 year old male with history of PAD s/p angioplasty/stenting on 04/24/2023, diastolic heart failure, CVA with left-sided hemiparesis, requires wheelchair to ambulate admitted with: #MSSA bacteremia likely secondary to left leg cellulitis #Poor dentition, no recent  dental procedures - Patient reports that he had a fall about a week ago ever since then his hip and leg has been hurting. - He states he has been doing okay since the stenting until the fall. - On arrival patient had white cell count 19K.  CT foot, tib-fib, femur showed marked soft tissue swelling calf extending to thigh and first dorsal foot nonspecific but correlation of cellulitis recommended.  MRI hip did not show signs of prosthetic material compromise. - Found to have MSSA bacteremia.  He does have poor dentition as well. - CT maxillofacial showed dental caries, recommended to see follow-up - TTE showed aortic valve thickening, no vegetation seen on TEE - Cellulitis appears to be solved. Recommendations: -Follow repeat blood cultures to ensure clearance - Continue cefazolin for 2 weeks from negative cultures EOT 12/12. - Follow-up with infectious disease on 12/17 - Midline ordered  -ID will sign off OPAT ORDERS:  Diagnosis: MSSA bacteremia likely secondary to left leg  cellulitis  Allergies  Allergen Reactions   Amitriptyline Hcl Hypertension   Other Shortness Of Breath and Other (See Comments)    SKIN is sensitive- Guards for the underwear MUST be removed immediately after using  Anesthesia- "They almost could not get me back" (HAS TO TAKE SPIRONOLACTONE NOW)   Losartan Potassium Other (See Comments)    Hypotension   Papaya Derivatives Itching and Other (See Comments)    Itching of the throat   Sulfa Antibiotics Other (See Comments)    Constipation      Discharge antibiotics to be given via PICC line:  Per pharmacy protocol Cefazolin 2 gm IV Q 8 hours   Duration: 2 weeks End Date: 06/26/23  [facility] Care Per Protocol with [employer] Use: Home health RN for IV administration and teaching, line care and labs.    Labs weekly while on IV antibiotics: _x_ CBC with differential __ BMP **TWICE WEEKLY ON VANCOMYCIN  _x_ CMP _xx_ CRP _x_ ESR __ Vancomycin trough TWICE WEEKLY __ CK  x__ Please pull PIC at completion of IV antibiotics __ Please leave PIC in place until doctor has seen patient or been notified  Fax weekly labs to [phone]  Clinic Follow Up Appt: 12/17  @ [facility] with Dr. Thedore Mins   Microbiology:   Antibiotics: Cefazolin Cultures: Blood 11/27 2/2 MSSA 11/29 no growth    SUBJECTIVE: Resting in bed eating lunch Interval: Afebrile overnight   Review of Systems: Review of Systems  All other systems reviewed and are negative.    Scheduled Meds:  (feeding supplement) PROSource Plus  30 mL Oral BID   acetaminophen  1,000 mg Oral BID   aspirin EC  81 mg Oral Daily   clopidogrel  75 mg Oral Daily   enoxaparin (LOVENOX) injection  40 mg Subcutaneous Q24H   feeding supplement  237 mL Oral TID BM   guaiFENesin-dextromethorphan  10 mL Oral QID   labetalol  200 mg Oral BID   magnesium hydroxide  15 mL Oral Daily   methocarbamol  500 mg Oral TID   multivitamin with minerals  1 tablet Oral Daily   pantoprazole  40 mg  Oral Daily   polyethylene glycol  17 g Oral Daily   predniSONE  50 mg Oral Q breakfast   Ensure Max Protein  11 oz Oral BID   rosuvastatin  20 mg Oral Daily   tamsulosin  0.4 mg Oral Daily   Continuous Infusions:   ceFAZolin (ANCEF) IV 2 g (06/16/23 1100)   PRN Meds:.acetaminophen **OR** acetaminophen, levalbuterol, morphine **OR** morphine injection, ondansetron **OR** ondansetron (ZOFRAN) IV, mouth rinse, tiZANidine Allergies  Allergen Reactions   Amitriptyline Hcl Hypertension   Other Shortness Of Breath and Other (See Comments)    SKIN is sensitive- Guards for the underwear MUST be removed immediately after using  Anesthesia- "They almost could not get me back" (HAS TO TAKE SPIRONOLACTONE NOW)   Losartan Potassium Other (See Comments)    Hypotension   Papaya Derivatives Itching and Other (See Comments)    Itching of the throat   Sulfa Antibiotics Other (See Comments)    Constipation     OBJECTIVE: Vitals:   06/16/23 1105 06/16/23 1114 06/16/23 1159 06/16/23 1328  BP: (!) 153/78   124/70  Pulse:    91  Resp:  19 17 17   Temp:    98.6 F (37 C)  TempSrc:    Oral  SpO2:    97%  Weight:      Height:       Body mass index is 21.82 kg/m.  Physical Exam Constitutional:      General: He is not in acute distress.    Appearance: He is normal weight. He is not toxic-appearing.  HENT:     Head: Normocephalic and atraumatic.     Right Ear: External ear normal.     Left Ear: External ear normal.     Nose: No congestion or rhinorrhea.     Mouth/Throat:     Mouth: Mucous membranes are moist.     Pharynx: Oropharynx is clear.  Eyes:     Extraocular Movements: Extraocular movements intact.     Conjunctiva/sclera: Conjunctivae normal.     Pupils: Pupils are equal, round, and reactive to light.  Cardiovascular:     Rate and Rhythm: Normal rate and regular rhythm.     Heart sounds: No murmur heard.    No friction rub. No gallop.  Pulmonary:     Effort: Pulmonary effort is  normal.     Breath sounds: Normal breath sounds.  Abdominal:     General: Abdomen is flat. Bowel sounds are normal.     Palpations: Abdomen is soft.  Musculoskeletal:        General: No swelling.     Cervical back: Normal range of motion and neck supple.  Skin:    General: Skin is warm and dry.  Neurological:     General: No focal deficit present.     Mental Status: He is oriented to person, place, and time.  Psychiatric:  Mood and Affect: Mood normal.       Lab Results Lab Results  Component Value Date   WBC 13.7 (H) 06/16/2023   HGB 12.9 (L) 06/16/2023   HCT 39.8 06/16/2023   MCV 102.6 (H) 06/16/2023   PLT 207 06/16/2023    Lab Results  Component Value Date   CREATININE 0.47 (L) 06/16/2023   BUN 34 (H) 06/16/2023   NA 125 (L) 06/16/2023   K 4.2 06/16/2023   CL 90 (L) 06/16/2023   CO2 26 06/16/2023    Lab Results  Component Value Date   ALT 66 (H) 06/10/2023   AST 64 (H) 06/10/2023   ALKPHOS 58 06/10/2023   BILITOT 1.0 06/10/2023        Danelle Earthly, MD [facility] 06/16/2023, 3:39 PM I have personally spent 54 minutes involved in face-to-face and non-face-to-face activities for this patient on the day of the visit. Professional time spent includes the following activities: Preparing to see the patient (review of tests), Obtaining and/or reviewing separately obtained history (admission/discharge record), Performing a medically appropriate examination and/or evaluation , Ordering medications/tests/procedures, referring and communicating with other health care professionals, Documenting clinical information in the EMR, Independently interpreting results (not separately reported), Communicating results to the patient/family/caregiver, Counseling and educating the patient/family/caregiver and Care coordination (not separately reported).

## 2023-06-17 DIAGNOSIS — R7881 Bacteremia: Secondary | ICD-10-CM | POA: Diagnosis not present

## 2023-06-17 DIAGNOSIS — M25552 Pain in left hip: Secondary | ICD-10-CM | POA: Diagnosis not present

## 2023-06-17 LAB — BASIC METABOLIC PANEL
Anion gap: 9 (ref 5–15)
BUN: 40 mg/dL — ABNORMAL HIGH (ref 8–23)
CO2: 26 mmol/L (ref 22–32)
Calcium: 8 mg/dL — ABNORMAL LOW (ref 8.9–10.3)
Chloride: 93 mmol/L — ABNORMAL LOW (ref 98–111)
Creatinine, Ser: 0.68 mg/dL (ref 0.61–1.24)
GFR, Estimated: >60 mL/min (ref >60)
Glucose, Bld: 221 mg/dL — ABNORMAL HIGH (ref 70–99)
Potassium: 3.9 mmol/L (ref 3.5–5.1)
Sodium: 128 mmol/L — ABNORMAL LOW (ref 135–145)

## 2023-06-17 LAB — CBC
HCT: 34.8 % — ABNORMAL LOW (ref 39.0–52.0)
Hemoglobin: 11.5 g/dL — ABNORMAL LOW (ref 13.0–17.0)
MCH: 33.6 pg (ref 26.0–34.0)
MCHC: 33 g/dL (ref 30.0–36.0)
MCV: 101.8 fL — ABNORMAL HIGH (ref 80.0–100.0)
Platelets: 213 10*3/uL (ref 150–400)
RBC: 3.42 MIL/uL — ABNORMAL LOW (ref 4.22–5.81)
RDW: 14.6 % (ref 11.5–15.5)
WBC: 16.3 10*3/uL — ABNORMAL HIGH (ref 4.0–10.5)
nRBC: 0.1 % (ref 0.0–0.2)

## 2023-06-17 LAB — CULTURE, BLOOD (ROUTINE X 2)
Culture: NO GROWTH
Culture: NO GROWTH
Special Requests: ADEQUATE
Special Requests: ADEQUATE

## 2023-06-17 LAB — MAGNESIUM: Magnesium: 1.9 mg/dL (ref 1.7–2.4)

## 2023-06-17 MED ORDER — SENNA 8.6 MG PO TABS: 1.0000 | ORAL_TABLET | Freq: Every day | ORAL | Status: DC | PRN

## 2023-06-17 MED ORDER — SODIUM CHLORIDE 0.9% FLUSH
10.0000 mL | Freq: Two times a day (BID) | INTRAVENOUS | Status: DC
Start: 2023-06-17 — End: 2023-06-18

## 2023-06-17 MED ORDER — SPIRONOLACTONE 25 MG PO TABS
25.0000 mg | ORAL_TABLET | Freq: Every day | ORAL | Status: DC
  Administered 2023-06-17 – 2023-06-18 (×2): 25 mg via ORAL
  Filled 2023-06-17 (×2): qty 1

## 2023-06-17 MED ORDER — SODIUM CHLORIDE 0.9% FLUSH: 10.0000 mL | INTRAVENOUS | Status: DC | PRN

## 2023-06-17 NOTE — Progress Notes (Addendum)
[facility] PROGRESS NOTE    Progress Note  Howard Jackson  QMV:[id number] DOB: 02/26/1946 DOA: 06/09/2023 PCP: [facility]     Brief Narrative:   Howard Jackson is an 77 y.o. 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 Currently be treating for cellulitis and MSSA bacteremia. TEE negative for endocarditis  Assessment/Plan:   Left leg cellulitis with MSSA bacteremia: Septic arthritis and abscess have been ruled out. CT and MRI were unremarkable. Currently on via cefazolin, TEE negative for endocarditis. Blood cultures so far have been negative. Plan for antibiotic until 06/25/2023. Follow-up with ID on 06/30/2023.  PICC line in place 05/27/2023. Currently awaiting skilled nursing facility placement.  Incidental small PFO finding:  Dilated ascending aorta: Follow-up with cardiology as an outpatient.  History of temporal arthritis: Follow-up with vascular neurology as an outpatient. Continue steroids at current dose.  Chronic hyponatremia: Currently asymptomatic.  History of CVA: Continue aspirin and Plavix and Crestor.  Chronic hyponatremia: Noted.  History of diastolic dysfunction/essential hypertension: Continue labetalol Lasix and Aldactone.  History of CAD: Continue stenting and aspirin.  Reactive airway disease: Continue Xopenex.  BPH: Continue Flomax.  Constipation: Continue bowel regimen.  Primary hyperaldosteronism: Resume Aldactone at a lower dose, potassium is stabilized.  Hyperglycemia:    DVT prophylaxis: lovenox Family Communication:none Status is: Inpatient Remains inpatient appropriate because: Bacteremia MSSA    Code Status:      Code Status Orders  (From admission, onward)           Start     Ordered   06/10/23 0208  Full code  Continuous       Question:  By:  Answer:  Consent: discussion documented in EHR   06/10/23 0208           Code Status History     Date Active Date Inactive Code Status Order ID Comments User Context   04/23/2023 1539 04/24/2023 1949 Full Code [id number]  Dara Lords, PA-C Inpatient   04/23/2023 1034 04/23/2023 1539 Full Code [id number]  Cephus Shelling, MD Inpatient   02/12/2023 1631 02/23/2023 1714 Full Code [id number]  Tonye Royalty, DO ED   12/17/2022 2145 12/20/2022 0338 Full Code [id number]  Therisa Doyne, MD ED   04/14/2018 2352 04/20/2018 1732 Full Code [id number]  Lorretta Harp, MD ED   11/18/2012 0957 11/19/2012 1445 Full Code [id number]  Shelly Rubenstein, MD Inpatient   05/25/2011 1102 05/25/2011 1434 Full Code [id number]  Blanchie Dessert, RN ED      Advance Directive Documentation    Flowsheet Row Most Recent Value  Type of Advance Directive Healthcare Power of Attorney, Living will  Pre-existing out of facility DNR order (yellow form or pink MOST form) --  "MOST" Form in Place? --         IV Access:   Peripheral IV   Procedures and diagnostic studies:   EP STUDY  Result Date: 06/15/2023 See surgical note for result.  ECHO TEE  Result Date: 06/15/2023    TRANSESOPHOGEAL ECHO REPORT   Patient Name:   Howard Jackson [facility] Date of Exam: 06/15/2023 Medical Rec #:  [id number]            Height:       67.0 in Accession #:  [id number]           Weight:       140.0 lb Date of Birth:  12-11-1945             BSA:          1.738 m Patient Age:    40 years             BP:           187/74 mmHg Patient Gender: M                    HR:           72 bpm. Exam Location:  Inpatient Procedure: Transesophageal Echo and Color Doppler Indications:     Bacteremia  History:         Patient has prior history of Echocardiogram examinations, most                  recent  06/11/2023. CHF, CAD, Stroke and Carotid Disease,                  Signs/Symptoms:Bacteremia; Risk Factors:Dyslipidemia and                  Hypertension.  Sonographer:     Milbert Coulter Referring Phys:  [id number] [facility] Diagnosing Phys: Thurmon Fair MD  Sonographer Comments: TEE probe [id number] used for procedure PROCEDURE: After discussion of the risks and benefits of a TEE, an informed consent was obtained from the patient. The transesophogeal probe was passed without difficulty through the esophogus of the patient. Imaged were obtained with the patient in a left lateral decubitus position. Sedation performed by different physician. The patient was monitored while under deep sedation. Anesthestetic sedation was provided intravenously by Anesthesiology: 109.27mg  of Propofol. Image quality was good. The patient's vital signs; including heart rate, blood pressure, and oxygen saturation; remained stable throughout the procedure. The patient developed no complications during the procedure.  IMPRESSIONS  1. Left ventricular ejection fraction, by estimation, is 60 to 65%. The left ventricle has normal function. The left ventricle has no regional wall motion abnormalities.  2. Right ventricular systolic function is normal. The right ventricular size is normal.  3. No left atrial/left atrial appendage thrombus was detected.  4. The mitral valve is normal in structure. Trivial mitral valve regurgitation. No evidence of mitral stenosis.  5. The aortic valve is tricuspid. Aortic valve regurgitation is trivial. No aortic stenosis is present.  6. Aortic dilatation noted. There is moderate dilatation of the ascending aorta, measuring 43 mm. There is mild (Grade II) protruding plaque involving the descending aorta.  7. The inferior vena cava is normal in size with greater than 50% respiratory variability, suggesting right atrial pressure of 3 mmHg.  8. Evidence of atrial level shunting detected by color flow Doppler. Agitated  saline contrast bubble study was positive with shunting observed within 3-6 cardiac cycles suggestive of interatrial shunt. There is a small patent foramen ovale with bidirectional shunting across atrial septum. Conclusion(s)/Recommendation(s): Normal biventricular function without evidence of hemodynamically significant valvular heart disease. No evidence of vegetation/infective endocarditis on this transesophageael echocardiogram. FINDINGS  Left Ventricle: Left ventricular ejection fraction, by estimation, is 60 to 65%. The left ventricle has normal function. The left ventricle has no regional wall motion abnormalities. The left ventricular internal cavity size was normal in size. There is  no left ventricular hypertrophy. Right Ventricle: The right ventricular size is normal. No increase in right  ventricular wall thickness. Right ventricular systolic function is normal. Left Atrium: Left atrial size was normal in size. No left atrial/left atrial appendage thrombus was detected. Right Atrium: Right atrial size was normal in size. Pericardium: There is no evidence of pericardial effusion. Mitral Valve: The mitral valve is normal in structure. Trivial mitral valve regurgitation. No evidence of mitral valve stenosis. Tricuspid Valve: The tricuspid valve is normal in structure. Tricuspid valve regurgitation is trivial. No evidence of tricuspid stenosis. Aortic Valve: The aortic valve is tricuspid. Aortic valve regurgitation is trivial. No aortic stenosis is present. Pulmonic Valve: The pulmonic valve was normal in structure. Pulmonic valve regurgitation is not visualized. No evidence of pulmonic stenosis. Aorta: Aortic dilatation noted and the aortic root is normal in size and structure. There is moderate dilatation of the ascending aorta, measuring 43 mm. There is mild (Grade II) protruding plaque involving the descending aorta. Venous: The inferior vena cava is normal in size with greater than 50% respiratory  variability, suggesting right atrial pressure of 3 mmHg. IAS/Shunts: Evidence of atrial level shunting detected by color flow Doppler. Agitated saline contrast bubble study was positive with shunting observed within 3-6 cardiac cycles suggestive of interatrial shunt. A small patent foramen ovale is detected with bidirectional shunting across atrial septum.   AORTA Ao Asc diam: 4.30 cm Thurmon Fair MD Electronically signed by Thurmon Fair MD Signature Date/Time: 06/15/2023/3:19:00 PM    Final      Medical Consultants:   None.   Subjective:    Riddick Perpich no complaints.  Objective:    Vitals:   06/16/23 1159 06/16/23 1328 06/16/23 2005 06/17/23 0451  BP:  124/70 132/73 (!) 147/68  Pulse:  91 85 77  Resp: 17 17 14 20   Temp:  98.6 F (37 C) 98.4 F (36.9 C) 98.2 F (36.8 C)  TempSrc:  Oral Oral Oral  SpO2:  97% 98% 97%  Weight:      Height:       SpO2: 97 %   Intake/Output Summary (Last 24 hours) at 06/17/2023 1134 Last data filed at 06/17/2023 0500 Gross per 24 hour  Intake 1250 ml  Output 1280 ml  Net -30 ml   Filed Weights   06/14/23 0500 06/15/23 0500 06/16/23 0500  Weight: 64.8 kg 63.5 kg 63.2 kg    Exam: General exam: In no acute distress. Respiratory system: Good air movement and clear to auscultation. Cardiovascular system: S1 & S2 heard, RRR. No JVD. Gastrointestinal system: Abdomen is nondistended, soft and nontender.  Extremities: No pedal edema. Skin: No rashes, lesions or ulcers Psychiatry: Judgement and insight appear normal. Mood & affect appropriate.    Data Reviewed:    Labs: Basic Metabolic Panel: Recent Labs  Lab 06/13/23 0519 06/14/23 0458 06/15/23 0936 06/16/23 0517 06/17/23 0535  NA 127* 126* 128* 125* 128*  K 4.9 5.2* 4.6 4.2 3.9  CL 91* 92* 92* 90* 93*  CO2 27 26 25 26 26   GLUCOSE 252* 278* 305* 169* 221*  BUN 47* 38* 40* 34* 40*  CREATININE 0.92 0.85 0.75 0.47* 0.68  CALCIUM 8.2* 8.3* 8.6* 8.4* 8.0*  MG 2.1 1.9  1.8 1.7 1.9   GFR Estimated Creatinine Clearance: 66.9 mL/min (by C-G formula based on SCr of 0.68 mg/dL). Liver Function Tests: No results for input(s): "AST", "ALT", "ALKPHOS", "BILITOT", "PROT", "ALBUMIN" in the last 168 hours. No results for input(s): "LIPASE", "AMYLASE" in the last 168 hours. No results for input(s): "AMMONIA" in the last 168 hours. Coagulation  profile No results for input(s): "INR", "PROTIME" in the last 168 hours. COVID-19 Labs  No results for input(s): "DDIMER", "FERRITIN", "LDH", "CRP" in the last 72 hours.  Lab Results  Component Value Date   SARSCOV2NAA NEGATIVE 12/17/2022   SARSCOV2NAA NEGATIVE 12/03/2022    CBC: Recent Labs  Lab 06/13/23 0519 06/14/23 0458 06/15/23 0936 06/16/23 0517 06/17/23 0535  WBC 16.3* 15.2* 17.4* 13.7* 16.3*  HGB 10.8* 11.4* 12.3* 12.9* 11.5*  HCT 33.9* 34.9* 40.4 39.8 34.8*  MCV 103.7* 101.7* 107.2* 102.6* 101.8*  PLT 167 186 211 207 213   Cardiac Enzymes: No results for input(s): "CKTOTAL", "CKMB", "CKMBINDEX", "TROPONINI" in the last 168 hours. BNP (last 3 results) No results for input(s): "PROBNP" in the last 8760 hours. CBG: Recent Labs  Lab 06/15/23 1446  GLUCAP 190*   D-Dimer: No results for input(s): "DDIMER" in the last 72 hours. Hgb A1c: No results for input(s): "HGBA1C" in the last 72 hours. Lipid Profile: No results for input(s): "CHOL", "HDL", "LDLCALC", "TRIG", "CHOLHDL", "LDLDIRECT" in the last 72 hours. Thyroid function studies: No results for input(s): "TSH", "T4TOTAL", "T3FREE", "THYROIDAB" in the last 72 hours.  Invalid input(s): "FREET3" Anemia work up: No results for input(s): "VITAMINB12", "FOLATE", "FERRITIN", "TIBC", "IRON", "RETICCTPCT" in the last 72 hours. Sepsis Labs: Recent Labs  Lab 06/14/23 0458 06/15/23 0936 06/16/23 0517 06/17/23 0535  WBC 15.2* 17.4* 13.7* 16.3*   Microbiology Recent Results (from the past 240 hour(s))  Culture, blood (Routine X 2) w Reflex to ID  Panel     Status: Abnormal   Collection Time: 06/10/23  3:00 AM   Specimen: BLOOD RIGHT HAND  Result Value Ref Range Status   Specimen Description   Final    BLOOD RIGHT HAND Performed at [facility], [address] [place], [place] [address]    Special Requests   Final    BOTTLES DRAWN AEROBIC AND ANAEROBIC Blood Culture adequate volume Performed at [facility], [address], [place], [place] [address]    Culture  Setup Time   Final    GRAM POSITIVE COCCI IN CLUSTERS IN BOTH AEROBIC AND ANAEROBIC BOTTLES CRITICAL RESULT CALLED TO, READ BACK BY AND VERIFIED WITH:  Eula Flax [id number] @ 2036 FH Performed at [facility], [address] [place], [place] [address]    Culture STAPHYLOCOCCUS AUREUS (A)  Final   Report Status 06/12/2023 FINAL  Final   Organism ID, Bacteria STAPHYLOCOCCUS AUREUS  Final      Susceptibility   Staphylococcus aureus - MIC*    CIPROFLOXACIN <=0.5 SENSITIVE Sensitive     ERYTHROMYCIN >=8 RESISTANT Resistant     GENTAMICIN <=0.5 SENSITIVE Sensitive     OXACILLIN <=0.25 SENSITIVE Sensitive     TETRACYCLINE <=1 SENSITIVE Sensitive     VANCOMYCIN <=0.5 SENSITIVE Sensitive     TRIMETH/SULFA <=10 SENSITIVE Sensitive     CLINDAMYCIN RESISTANT Resistant     RIFAMPIN <=0.5 SENSITIVE Sensitive     Inducible Clindamycin POSITIVE Resistant     LINEZOLID 2 SENSITIVE Sensitive     * STAPHYLOCOCCUS AUREUS  Blood Culture ID Panel (Reflexed)     Status: Abnormal   Collection Time: 06/10/23  3:00 AM  Result Value Ref Range Status   Enterococcus faecalis NOT DETECTED NOT DETECTED Final   Enterococcus Faecium NOT DETECTED NOT DETECTED Final   Listeria monocytogenes NOT DETECTED NOT DETECTED Final   Staphylococcus species DETECTED (A) NOT DETECTED Final    Comment: CRITICAL RESULT  CALLED TO, READ BACK BY AND VERIFIED WITH: PHARMD D. WOFFORD [id number] @ 2036 FH    Staphylococcus aureus (BCID) DETECTED (A) NOT DETECTED Final     Comment: CRITICAL RESULT CALLED TO, READ BACK BY AND VERIFIED WITH: PHARMD D. URKYHCW [id number] @ 2036 FH    Staphylococcus epidermidis NOT DETECTED NOT DETECTED Final   Staphylococcus lugdunensis NOT DETECTED NOT DETECTED Final   Streptococcus species NOT DETECTED NOT DETECTED Final   Streptococcus agalactiae NOT DETECTED NOT DETECTED Final   Streptococcus pneumoniae NOT DETECTED NOT DETECTED Final   Streptococcus pyogenes NOT DETECTED NOT DETECTED Final   A.calcoaceticus-baumannii NOT DETECTED NOT DETECTED Final   Bacteroides fragilis NOT DETECTED NOT DETECTED Final   Enterobacterales NOT DETECTED NOT DETECTED Final   Enterobacter cloacae complex NOT DETECTED NOT DETECTED Final   Escherichia coli NOT DETECTED NOT DETECTED Final   Klebsiella aerogenes NOT DETECTED NOT DETECTED Final   Klebsiella oxytoca NOT DETECTED NOT DETECTED Final   Klebsiella pneumoniae NOT DETECTED NOT DETECTED Final   Proteus species NOT DETECTED NOT DETECTED Final   Salmonella species NOT DETECTED NOT DETECTED Final   Serratia marcescens NOT DETECTED NOT DETECTED Final   Haemophilus influenzae NOT DETECTED NOT DETECTED Final   Neisseria meningitidis NOT DETECTED NOT DETECTED Final   Pseudomonas aeruginosa NOT DETECTED NOT DETECTED Final   Stenotrophomonas maltophilia NOT DETECTED NOT DETECTED Final   Candida albicans NOT DETECTED NOT DETECTED Final   Candida auris NOT DETECTED NOT DETECTED Final   Candida glabrata NOT DETECTED NOT DETECTED Final   Candida krusei NOT DETECTED NOT DETECTED Final   Candida parapsilosis NOT DETECTED NOT DETECTED Final   Candida tropicalis NOT DETECTED NOT DETECTED Final   Cryptococcus neoformans/gattii NOT DETECTED NOT DETECTED Final   Meth resistant mecA/C and MREJ NOT DETECTED NOT DETECTED Final    Comment: Performed at [facility], [address], [place], [place] [address]  Culture, blood (Routine X 2) w Reflex to ID Panel     Status: Abnormal   Collection Time:  06/10/23  6:05 AM   Specimen: BLOOD  Result Value Ref Range Status   Specimen Description   Final    BLOOD RIGHT ANTECUBITAL Performed at [facility], [address] [place], [place] [address]    Special Requests   Final    BOTTLES DRAWN AEROBIC AND ANAEROBIC Blood Culture adequate volume Performed at [facility], [address], [place], [place] [address]    Culture  Setup Time   Final    GRAM POSITIVE COCCI IN CLUSTERS IN BOTH AEROBIC AND ANAEROBIC BOTTLES CRITICAL VALUE NOTED.  VALUE IS CONSISTENT WITH PREVIOUSLY REPORTED AND CALLED VALUE.    Culture (A)  Final    STAPHYLOCOCCUS AUREUS SUSCEPTIBILITIES PERFORMED ON PREVIOUS CULTURE WITHIN THE LAST 5 DAYS. Performed at [facility], [address], [place], [place] [address]    Report Status 06/12/2023 FINAL  Final  Culture, blood (Routine X 2) w Reflex to ID Panel     Status: None   Collection Time: 06/12/23  5:35 AM   Specimen: BLOOD  Result Value Ref Range Status   Specimen Description   Final    BLOOD BLOOD LEFT ARM Performed at [facility], [address], [place], [place] [address]    Special Requests   Final    BOTTLES DRAWN AEROBIC ONLY Blood Culture adequate volume Performed at [facility], [address], [place], [place] [address]  Culture   Final    NO GROWTH 5 DAYS Performed at [facility], [address] [place], [place] [address]    Report Status 06/17/2023 FINAL  Final  Culture, blood (Routine X 2) w Reflex to ID Panel     Status: None   Collection Time: 06/12/23  5:35 AM   Specimen: BLOOD  Result Value Ref Range Status   Specimen Description   Final    BLOOD BLOOD LEFT HAND Performed at [facility], [address], [place], [place] [address]    Special Requests   Final    BOTTLES DRAWN AEROBIC ONLY Blood Culture adequate volume Performed at [facility], [address], [place], [place] [address]    Culture   Final    NO GROWTH 5 DAYS Performed at [facility], [address] [place], [place] [address]    Report Status 06/17/2023 FINAL  Final     Medications:    (feeding supplement) PROSource Plus  30 mL Oral BID   acetaminophen  1,000 mg Oral BID   aspirin EC  81 mg Oral Daily   clopidogrel  75 mg Oral Daily   enoxaparin (LOVENOX) injection  40 mg Subcutaneous Q24H   feeding supplement  237 mL Oral TID BM   guaiFENesin-dextromethorphan  10 mL Oral QID   labetalol  200 mg Oral BID   magnesium hydroxide  15 mL Oral Daily   methocarbamol  500 mg Oral TID   multivitamin with minerals  1 tablet Oral Daily   pantoprazole  40 mg Oral Daily   polyethylene glycol  17 g Oral Daily   predniSONE  50 mg Oral Q breakfast   Ensure Max Protein  11 oz Oral BID   rosuvastatin  20 mg Oral Daily   sodium chloride flush  10-40 mL Intracatheter Q12H   tamsulosin  0.4 mg Oral Daily   Continuous Infusions:   ceFAZolin (ANCEF) IV 2 g (06/17/23 0946)      LOS: 7 days   Marinda Elk  [facility]  06/17/2023, 11:34 AM

## 2023-06-17 NOTE — Progress Notes (Signed)
Nutrition Follow-up  DOCUMENTATION CODES:   Non-severe (moderate) malnutrition in context of chronic illness  INTERVENTION:   -Ensure Plus High Protein po TID, each supplement provides 350 kcal and 20 grams of protein.   -Prosource Plus PO BID, each provides 100 kcals and 15g protein  -Multivitamin with minerals daily  -Patient eating well, no changes to interventions, will sign off at this time. Consult for further needs.  NUTRITION DIAGNOSIS:   Moderate Malnutrition related to chronic illness as evidenced by mild fat depletion, moderate muscle depletion.  Ongoing.  GOAL:   Patient will meet greater than or equal to 90% of their needs  Progressing.  MONITOR:   PO intake, Supplement acceptance, Labs, Weight trends, I & O's  ASSESSMENT:   77 y.o. male with medical history significant of history of CAD, essential hypertension, hyperlipidemia, CVA with left-sided hemiparesis, PAD status post angioplasty/stenting 04/24/2023, chronic diastolic heart failure and BPH presented to emergency department with complaining of left-sided leg edema up to the which has been getting worse. He is bedbound due to history of CVA.  Patient currently consuming 100% of meals. Accepting most doses of Ensure and vitamin supplements.   Admission weight: 150 lbs Current weight: 139 lbs  Medications: Multivitamin with minerals daily Miralax, Aldactone  Labs reviewed: CBGs: 190 Low Na   Diet Order:   Diet Order             Diet regular Room service appropriate? Yes; Fluid consistency: Thin; Fluid restriction: 1200 mL Fluid  Diet effective now                   EDUCATION NEEDS:   No education needs have been identified at this time  Skin:  Skin Assessment: Reviewed RN Assessment  Last BM:  12/2 -type 4  Height:   Ht Readings from Last 1 Encounters:  06/10/23 5\' 7"  (1.702 m)    Weight:   Wt Readings from Last 1 Encounters:  06/16/23 63.2 kg    BMI:  Body mass index  is 21.82 kg/m.  Estimated Nutritional Needs:   Kcal:  1650-1850  Protein:  75-90g  Fluid:  1.8L/day  Tilda Franco, MS, RD, LDN Inpatient Clinical Dietitian Contact information available via Amion

## 2023-06-17 NOTE — Plan of Care (Signed)

## 2023-06-17 NOTE — Progress Notes (Signed)

## 2023-06-17 NOTE — Progress Notes (Signed)
Occupational Therapy Treatment Patient Details Name: Howard Jackson MRN: [id number] DOB: [birth year] Today's Date: 06/17/2023   History of present illness 77 y.o. male admitted 06/10/23 with LLE cellulitis with MSSA bacteremia. 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   OT comments  Total assist to pull pt up in bed for self feeding and grooming with set up at bed level. Requires increased time for all ADLs, but demonstrates thoroughness. Patient will benefit from continued inpatient follow up therapy, <3 hours/day.       If plan is discharge home, recommend the following:  Two people to help with walking and/or transfers;Two people to help with bathing/dressing/bathroom;Assistance with cooking/housework;Assistance with feeding;Assist for transportation;Help with stairs or ramp for entrance   Equipment Recommendations  Other (comment) (defer)    Recommendations for Other Services      Precautions / Restrictions Precautions Precautions: Fall Precaution Comments: L hemiplegia Restrictions Weight Bearing Restrictions: No       Mobility Bed Mobility Overal bed mobility: Needs Assistance             General bed mobility comments: total assist to pull up in bed prior to eating and shaving    Transfers                         Balance                                           ADL either performed or assessed with clinical judgement   ADL   Eating/Feeding: Set up;Bed level (assist to cut food and open containers)   Grooming: Wash/dry face;Bed level;Set up (shaving)                                      Extremity/Trunk Assessment              Vision       Perception     Praxis      Cognition Arousal: Alert Behavior During Therapy: WFL for tasks  assessed/performed Overall Cognitive Status: Within Functional Limits for tasks assessed                                          Exercises      Shoulder Instructions       General Comments      Pertinent Vitals/ Pain       Pain Assessment Pain Assessment: Faces Faces Pain Scale: Hurts even more Pain Location: L hip Pain Descriptors / Indicators: Grimacing, Guarding, Moaning, Constant Pain Intervention(s): Monitored during session, Repositioned  Home Living                                          Prior Functioning/Environment              Frequency  Min 1X/week        Progress Toward Goals  OT Goals(current goals can now be found in  the care plan section)  Progress towards OT goals: Progressing toward goals  Acute Rehab OT Goals OT Goal Formulation: With patient Time For Goal Achievement: 06/24/23 Potential to Achieve Goals: Fair  Plan      Co-evaluation                 AM-PAC OT "6 Clicks" Daily Activity     Outcome Measure   Help from another person eating meals?: A Little Help from another person taking care of personal grooming?: A Little Help from another person toileting, which includes using toliet, bedpan, or urinal?: Total Help from another person bathing (including washing, rinsing, drying)?: Total Help from another person to put on and taking off regular upper body clothing?: Total Help from another person to put on and taking off regular lower body clothing?: Total 6 Click Score: 10    End of Session    OT Visit Diagnosis: Pain;Muscle weakness (generalized) (M62.81);Hemiplegia and hemiparesis Hemiplegia - Right/Left: Left Hemiplegia - dominant/non-dominant: Non-Dominant Hemiplegia - caused by: Cerebral infarction   Activity Tolerance Patient tolerated treatment well   Patient Left in bed;with call bell/phone within reach;with bed alarm set;with nursing/sitter in room   Nurse Communication           Time: 1610-9604 OT Time Calculation (min): 46 min  Charges: OT General Charges $OT Visit: 1 Visit OT Treatments $Self Care/Home Management : 38-52 mins  Berna Spare, OTR/L Acute Rehabilitation Services Office: [phone]  Evern Bio 06/17/2023, 10:26 AM

## 2023-06-18 DIAGNOSIS — L03116 Cellulitis of left lower limb: Secondary | ICD-10-CM | POA: Diagnosis not present

## 2023-06-18 DIAGNOSIS — M25552 Pain in left hip: Secondary | ICD-10-CM | POA: Diagnosis not present

## 2023-06-18 DIAGNOSIS — L02416 Cutaneous abscess of left lower limb: Secondary | ICD-10-CM | POA: Diagnosis not present

## 2023-06-18 DIAGNOSIS — R7881 Bacteremia: Secondary | ICD-10-CM | POA: Diagnosis not present

## 2023-06-18 LAB — CBC
HCT: 34.7 % — ABNORMAL LOW (ref 39.0–52.0)
Hemoglobin: 11.3 g/dL — ABNORMAL LOW (ref 13.0–17.0)
MCH: 33.3 pg (ref 26.0–34.0)
MCHC: 32.6 g/dL (ref 30.0–36.0)
MCV: 102.4 fL — ABNORMAL HIGH (ref 80.0–100.0)
Platelets: 227 10*3/uL (ref 150–400)
RBC: 3.39 MIL/uL — ABNORMAL LOW (ref 4.22–5.81)
RDW: 14.5 % (ref 11.5–15.5)
WBC: 15.7 10*3/uL — ABNORMAL HIGH (ref 4.0–10.5)
nRBC: 0.1 % (ref 0.0–0.2)

## 2023-06-18 LAB — BASIC METABOLIC PANEL
Anion gap: 7 (ref 5–15)
BUN: 42 mg/dL — ABNORMAL HIGH (ref 8–23)
CO2: 28 mmol/L (ref 22–32)
Calcium: 7.7 mg/dL — ABNORMAL LOW (ref 8.9–10.3)
Chloride: 93 mmol/L — ABNORMAL LOW (ref 98–111)
Creatinine, Ser: 0.7 mg/dL (ref 0.61–1.24)
GFR, Estimated: >60 mL/min (ref >60)
Glucose, Bld: 210 mg/dL — ABNORMAL HIGH (ref 70–99)
Potassium: 4.3 mmol/L (ref 3.5–5.1)
Sodium: 128 mmol/L — ABNORMAL LOW (ref 135–145)

## 2023-06-18 LAB — MAGNESIUM: Magnesium: 1.9 mg/dL (ref 1.7–2.4)

## 2023-06-18 MED ORDER — ASPIRIN 81 MG PO TBEC: 81.0000 mg | DELAYED_RELEASE_TABLET | Freq: Every day | ORAL | 12 refills | Status: AC

## 2023-06-18 MED ORDER — CEFAZOLIN IV (FOR PTA / DISCHARGE USE ONLY): 2.0000 g | Freq: Three times a day (TID) | INTRAVENOUS | 0 refills | Status: AC

## 2023-06-18 MED ORDER — PREDNISONE 20 MG PO TABS: 40.0000 mg | ORAL_TABLET | Freq: Every day | ORAL | Status: AC

## 2023-06-18 NOTE — Care Management Important Message (Signed)
Important Message  Patient Details IM Letter given. Name: Tadarius Ayles MRN: [id number] Date of Birth: February 09, 1946   Important Message Given:  Yes - Medicare IM     Caren Macadam 06/18/2023, 10:12 AM

## 2023-06-18 NOTE — TOC Transition Note (Addendum)
Transition of Care [facility]) - CM/SW Discharge Note   Patient Details  Name: Howard Jackson MRN: [id number] Date of Birth: 20-Mar-1946  [facility] CM/SW Contact:  Howell Rucks, RN Phone Number: 06/18/2023, 10:24 AM   Clinical Narrative:  DC to short term rehab/SNF at [facility]. Colin Mulders, admissions coordinator at [facility] notified,  confirmed  bed is available. RM 207B, nurse report [phone]. Lucky Rathke, CM at [facility] notified, confirmed [facility] will provide transportation. Team notified. No further TOC needs identified.     Final next level of care: Skilled Nursing Facility Barriers to Discharge: Continued Medical Work up   Patient Goals and CMS Choice CMS Medicare.gov Compare Post Acute Care list provided to:: Patient Choice offered to / list presented to : Patient  Discharge Placement                Patient chooses bed at: [facility] Patient to be transferred to facility by: [facility] Name of family member notified: Shalev, Ridenbaugh (Daughter)  [phone] (Home Phone) Patient and family notified of of transfer: 06/18/23  Discharge Plan and Services Additional resources added to the After Visit Summary for                                       Social Determinants of Health (SDOH) Interventions SDOH Screenings   Food Insecurity: No Food Insecurity (06/10/2023)  Housing: Low Risk  (06/10/2023)  Transportation Needs: No Transportation Needs (06/10/2023)  Utilities: Not At Risk (06/10/2023)  Social Connections: Unknown (11/25/2021)   Received from [facility]  Tobacco Use: Low Risk  (06/10/2023)     Readmission Risk Interventions    06/10/2023   10:26 AM 02/16/2023    3:07 PM  Readmission Risk Prevention Plan  Transportation Screening Complete Complete  PCP or Specialist Appt within 5-7 Days Complete   PCP or Specialist Appt within 3-5 Days  Complete  Home Care Screening  Complete   Medication Review (RN CM) Complete   HRI or Home Care Consult  Complete  Social Work Consult for Recovery Care Planning/Counseling  Complete  Palliative Care Screening  Not Applicable  Medication Review Oceanographer)  Complete

## 2023-06-18 NOTE — Progress Notes (Signed)
Report given to Malachi Bonds, Charity fundraiser at [facility].

## 2023-06-18 NOTE — Discharge Summary (Signed)
Physician Discharge Summary  Howard Jackson MVH:[id number] DOB: 1946-02-04 DOA: 06/09/2023  PCP: [facility]  Admit date: 06/09/2023 Discharge date: 06/18/2023  Admitted From: Home Disposition:  SNF  Recommendations for Outpatient Follow-up:  Follow up with PCP in 1-2 weeks Please obtain BMP/CBC in one week Follow up with ID in 1 week.   Home Health:No Equipment/Devices:None  Discharge Condition:Stable CODE STATUS:Full Diet recommendation: Heart Healthy   Brief/Interim Summary: 77 y.o. 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 Currently be treating for cellulitis and MSSA bacteremia. TEE negative for endocarditis    Discharge Diagnoses:  Principal Problem:   Pain of left hip joint Active Problems:   Swelling of left lower extremity   Cellulitis and abscess of left lower extremity   Left lower extremity PVD status post stent placement x 5 04/24/2023   History of cerebrovascular accident (CVA) with residual deficit   BPH (benign prostatic hyperplasia)   Hyperlipidemia   Chronic diastolic heart failure (HCC)   Chronic hyponatremia   Malnutrition of moderate degree   History of CAD (coronary artery disease)   Bacteremia  Left leg cellulitis with MSSA bacteremia: Septic arthritis and abscess have been ruled out. CT scan and MRI of the left leg were unremarkable. TEE was negative for endocarditis. Surveillance blood cultures have been negative till date. ID was consulted with agree with management. End date of antibiotics is 06/25/2023. PICC line was placed on 05/27/2023.  PT evaluated the patient will need skilled nursing facility placement.  Incidental small  PFO: Noted.  Dilated ascending aorta: Noted on TEE follow-up with cardiology as an outpatient.  History of temporal arteritis: Follow-up with vascular surgery and neurology as an outpatient. Will continue to taper down steroids biweekly by 10 mg.  Chronic hyponatremia: Has remained relatively stable his ranges usually around 1 26-1 32.  History of CVA: Continue aspirin Plavix and Crestor.  History of chronic diastolic dysfunction: No change made to his medication continue labetalol Lasix and Aldactone.  History of CAD: Continue statin and aspirin.  Reactive airway disease: Due to Xopenex per  BPH: Continue Flomax.  Constipation: Continue bowel regimen.  Primary hyperaldosteronism: Continue Aldactone. She can be made in 1 week.  Discharge Instructions  Discharge Instructions     Diet - low sodium heart healthy   Complete by: As directed    Home infusion instructions   Complete by: As directed    Instructions: Flushing of vascular access device: 0.9% NaCl pre/post medication administration and prn patency; Heparin 100 u/ml, 5ml for implanted ports and Heparin 10u/ml, 5ml for all other central venous catheters.   Increase activity slowly   Complete by: As directed       Allergies as of 06/18/2023       Reactions   Amitriptyline Hcl Hypertension   Other Shortness Of Breath, Other (See Comments)   SKIN is sensitive- Guards for the underwear MUST be removed immediately after using Anesthesia- "They almost could not get me back" (HAS TO TAKE SPIRONOLACTONE NOW)   Losartan Potassium Other (See Comments)   Hypotension   Papaya Derivatives Itching, Other (See Comments)   Itching of the throat   Sulfa Antibiotics Other (See Comments)   Constipation         Medication List  STOP taking these medications    aluminum-magnesium hydroxide 200-200 MG/5ML suspension   diclofenac Sodium 1 % Gel Commonly known as: VOLTAREN   Vitamin D3 50 MCG (2000 UT) Tabs        TAKE these medications    (feeding supplement) PROSource Plus liquid Take 30 mLs by mouth 2 (two) times daily between meals.   acetaminophen 500 MG tablet Commonly known as: TYLENOL Take 1,000 mg by mouth every 6 (six) hours as needed for moderate pain (pain score 4-6) or mild pain (pain score 1-3).   Arnuity Ellipta 200 MCG/ACT Aepb Generic drug: Fluticasone Furoate Inhale 1 puff into the lungs daily. Rinse mouth after use   aspirin EC 81 MG tablet Take 1 tablet (81 mg total) by mouth daily. Swallow whole.   ceFAZolin IVPB Commonly known as: ANCEF Inject 2 g into the vein every 8 (eight) hours for 10 days. Indication:  MSSA bacteremia First Dose: Yes Last Day of Therapy:  06/26/23 Labs - Once weekly:  CBC/D and BMP, Labs - Once weekly: ESR and CRP   CERTAVITE SENIOR PO Take 1 tablet by mouth daily.   clopidogrel 75 MG tablet Commonly known as: PLAVIX Take 1 tablet (75 mg total) by mouth daily.   Co Q 10 100 MG Caps Take 100 mg by mouth daily.   finasteride 5 MG tablet Commonly known as: PROSCAR Take 5 mg by mouth daily. For prostate   fluticasone 44 MCG/ACT inhaler Commonly known as: FLOVENT HFA Inhale 2 puffs into the lungs 2 (two) times daily.   labetalol 300 MG tablet Commonly known as: NORMODYNE Take 300 mg by mouth 2 (two) times daily. For blood pressure   NIFEdipine 30 MG 24 hr tablet Commonly known as: ADALAT CC Take 30 mg by mouth in the morning.   NIFEdipine 60 MG 24 hr tablet Commonly known as: ADALAT CC Take 1 tablet (60 mg total) by mouth every evening.   pantoprazole 40 MG tablet Commonly known as: PROTONIX Take 1 tablet (40 mg total) by mouth daily. Take along with prednisone   predniSONE 20 MG tablet Commonly known as: DELTASONE Take 60 mg by mouth daily with breakfast. For inflammation   PROBIOTIC DAILY PO Take 500 mg by mouth in the morning and at bedtime. Gericare Probiotic capsules   protein supplement Liqd Take 30 mLs by  mouth 2 (two) times daily.   rosuvastatin 20 MG tablet Commonly known as: CRESTOR TOME UNA TABLETA TODOS LOS DIAS What changed: See the new instructions.   sennosides-docusate sodium 8.6-50 MG tablet Commonly known as: SENOKOT-S Take 2 tablets by mouth in the morning and at bedtime.   spironolactone 50 MG tablet Commonly known as: ALDACTONE Take 1 tablet (50 mg total) by mouth daily. What changed: when to take this   tamsulosin 0.4 MG Caps capsule Commonly known as: FLOMAX Take 1 capsule (0.4 mg total) by mouth daily. What changed: when to take this   tiZANidine 2 MG tablet Commonly known as: ZANAFLEX Take 1 tablet (2 mg total) by mouth daily as needed for muscle spasms.   triamcinolone ointment 0.1 % Commonly known as: KENALOG Apply 1 Application topically 2 (two) times daily as needed (for dryness).   Zinc Oxide 40 % Pste Apply 1 Application topically as needed for irritation (Skin protection).               Home Infusion Instuctions  (From admission, onward)           Start  Ordered   06/18/23 0000  Home infusion instructions       Question:  Instructions  Answer:  Flushing of vascular access device: 0.9% NaCl pre/post medication administration and prn patency; Heparin 100 u/ml, 5ml for implanted ports and Heparin 10u/ml, 5ml for all other central venous catheters.   06/18/23 1005            Allergies  Allergen Reactions   Amitriptyline Hcl Hypertension   Other Shortness Of Breath and Other (See Comments)    SKIN is sensitive- Guards for the underwear MUST be removed immediately after using  Anesthesia- "They almost could not get me back" (HAS TO TAKE SPIRONOLACTONE NOW)   Losartan Potassium Other (See Comments)    Hypotension   Papaya Derivatives Itching and Other (See Comments)    Itching of the throat   Sulfa Antibiotics Other (See Comments)    Constipation     Consultations: Infectious disease.    Procedures/Studies: EP  STUDY  Result Date: 06/17/23 See surgical note for result.  ECHO TEE  Result Date: 06-17-2023    TRANSESOPHOGEAL ECHO REPORT   Patient Name:   Howard Jackson [facility] Date of Exam: 06-17-23 Medical Rec #:  [id number]            Height:       67.0 in Accession #:    [id number]           Weight:       140.0 lb Date of Birth:  01-12-1946             BSA:          1.738 m Patient Age:    77 years             BP:           187/74 mmHg Patient Gender: M                    HR:           72 bpm. Exam Location:  Inpatient Procedure: Transesophageal Echo and Color Doppler Indications:     Bacteremia  History:         Patient has prior history of Echocardiogram examinations, most                  recent 06/11/2023. CHF, CAD, Stroke and Carotid Disease,                  Signs/Symptoms:Bacteremia; Risk Factors:Dyslipidemia and                  Hypertension.  Sonographer:     Milbert Coulter Referring Phys:  [id number] [address] Diagnosing Phys: Thurmon Fair MD  Sonographer Comments: TEE probe [phone] used for procedure PROCEDURE: After discussion of the risks and benefits of a TEE, an informed consent was obtained from the patient. The transesophogeal probe was passed without difficulty through the esophogus of the patient. Imaged were obtained with the patient in a left lateral decubitus position. Sedation performed by different physician. The patient was monitored while under deep sedation. Anesthestetic sedation was provided intravenously by Anesthesiology: 109.27mg  of Propofol. Image quality was good. The patient's vital signs; including heart rate, blood pressure, and oxygen saturation; remained stable throughout the procedure. The patient developed no complications during the procedure.  IMPRESSIONS  1. Left ventricular ejection fraction, by estimation, is 60 to 65%. The left ventricle has normal function. The left ventricle has no regional wall motion abnormalities.  2. Right ventricular systolic function is normal.  The right ventricular size is normal.  3. No left atrial/left atrial appendage thrombus was detected.  4. The mitral valve is normal in structure. Trivial mitral valve regurgitation. No evidence of mitral stenosis.  5. The aortic valve is tricuspid. Aortic valve regurgitation is trivial. No aortic stenosis is present.  6. Aortic dilatation noted. There is moderate dilatation of the ascending aorta, measuring 43 mm. There is mild (Grade II) protruding plaque involving the descending aorta.  7. The inferior vena cava is normal in size with greater than 50% respiratory variability, suggesting right atrial pressure of 3 mmHg.  8. Evidence of atrial level shunting detected by color flow Doppler. Agitated saline contrast bubble study was positive with shunting observed within 3-6 cardiac cycles suggestive of interatrial shunt. There is a small patent foramen ovale with bidirectional shunting across atrial septum. Conclusion(s)/Recommendation(s): Normal biventricular function without evidence of hemodynamically significant valvular heart disease. No evidence of vegetation/infective endocarditis on this transesophageael echocardiogram. FINDINGS  Left Ventricle: Left ventricular ejection fraction, by estimation, is 60 to 65%. The left ventricle has normal function. The left ventricle has no regional wall motion abnormalities. The left ventricular internal cavity size was normal in size. There is  no left ventricular hypertrophy. Right Ventricle: The right ventricular size is normal. No increase in right ventricular wall thickness. Right ventricular systolic function is normal. Left Atrium: Left atrial size was normal in size. No left atrial/left atrial appendage thrombus was detected. Right Atrium: Right atrial size was normal in size. Pericardium: There is no evidence of pericardial effusion. Mitral Valve: The mitral valve is normal in structure. Trivial mitral valve regurgitation. No evidence of mitral valve stenosis.  Tricuspid Valve: The tricuspid valve is normal in structure. Tricuspid valve regurgitation is trivial. No evidence of tricuspid stenosis. Aortic Valve: The aortic valve is tricuspid. Aortic valve regurgitation is trivial. No aortic stenosis is present. Pulmonic Valve: The pulmonic valve was normal in structure. Pulmonic valve regurgitation is not visualized. No evidence of pulmonic stenosis. Aorta: Aortic dilatation noted and the aortic root is normal in size and structure. There is moderate dilatation of the ascending aorta, measuring 43 mm. There is mild (Grade II) protruding plaque involving the descending aorta. Venous: The inferior vena cava is normal in size with greater than 50% respiratory variability, suggesting right atrial pressure of 3 mmHg. IAS/Shunts: Evidence of atrial level shunting detected by color flow Doppler. Agitated saline contrast bubble study was positive with shunting observed within 3-6 cardiac cycles suggestive of interatrial shunt. A small patent foramen ovale is detected with bidirectional shunting across atrial septum.   AORTA Ao Asc diam: 4.30 cm Thurmon Fair MD Electronically signed by Thurmon Fair MD Signature Date/Time: 06/15/2023/3:19:00 PM    Final    CT MAXILLOFACIAL WO CONTRAST  Result Date: 06/12/2023 CLINICAL DATA:  Facial swelling, evaluate for abscess. EXAM: CT MAXILLOFACIAL WITHOUT CONTRAST TECHNIQUE: Multidetector CT imaging of the maxillofacial structures was performed. Multiplanar CT image reconstructions were also generated. RADIATION DOSE REDUCTION: This exam was performed according to the departmental dose-optimization program which includes automated exposure control, adjustment of the mA and/or kV according to patient size and/or use of iterative reconstruction technique. COMPARISON:  Maxillofacial CT 12/18/2022 with contrast. FINDINGS: Osseous: No fracture or mandibular dislocation. No destructive process. There are multifocal dental caries. Follow-up with  a dentist is recommended. Orbits: No acute traumatic or inflammatory findings. There is bilateral proptosis. This has either increased with increased orbital fat or is  possibly perceived to have increased due to positional differences between the 2 studies. There is no thickening of the extraocular muscles. The optic nerves are symmetric. The globes are normal in shape with unremarkable lenses, unremarkable lacrimal apparatus. Sinuses: There is moderate motion on exam. The sinuses and mastoids appear generally clear. There are bilateral middle turbinate concha bullosa and mild left deviation of the nasal septum. Soft tissues: No abscess or focal facial edema is seen. Both proximal cervical ICAs are heavily calcified and there are high bifurcations. Limited intracranial: No visible acute or unexpected finding. IMPRESSION: 1. No abscess or focal facial edema is seen. 2. Bilateral proptosis which has either increased with increased orbital fat or is possibly perceived to have increased due to positional differences between the 2 studies. No extraocular muscle thickening. 3. Multifocal dental caries. Follow-up with a dentist is recommended. 4. Bilateral middle turbinate concha bullosa and mild left deviation of the nasal septum. 5. Heavily calcified proximal cervical ICAs with high bifurcations. Electronically Signed   By: Almira Bar M.D.   On: 06/12/2023 07:36   ECHOCARDIOGRAM COMPLETE  Result Date: 06/11/2023    ECHOCARDIOGRAM REPORT   Patient Name:   Howard Jackson [facility] Date of Exam: 06/11/2023 Medical Rec #:  [id number]            Height:       67.0 in Accession #:    [id number]           Weight:       145.5 lb Date of Birth:  1945/11/28             BSA:          1.767 m Patient Age:    77 years             BP:           111/62 mmHg Patient Gender: M                    HR:           78 bpm. Exam Location:  Inpatient Procedure: 2D Echo, Cardiac Doppler and Color Doppler Indications:    Bacteremia  History:         Patient has prior history of Echocardiogram examinations, most                 recent 05/31/2010. CAD, history of stroke, Signs/Symptoms:Edema;                 Risk Factors:Hypertension and Dyslipidemia.  Sonographer:    Delcie Roch RDCS Referring Phys: [id number] PRANAV M PATEL IMPRESSIONS  1. Technically difficult study with limited visualization of cardiac structures.  2. Left ventricular ejection fraction, by estimation, is 60 to 65%. The left ventricle has normal function. The left ventricle has no regional wall motion abnormalities. Left ventricular diastolic parameters are indeterminate.  3. Right ventricular systolic function is normal. The right ventricular size is normal. There is normal pulmonary artery systolic pressure.  4. The mitral valve is normal in structure. No evidence of mitral valve regurgitation. No evidence of mitral stenosis.  5. The aortic valve is calcified. There is moderate calcification of the aortic valve. There is mild thickening of the aortic valve. Aortic valve regurgitation is not visualized. No aortic stenosis is present.  6. The inferior vena cava is normal in size with greater than 50% respiratory variability, suggesting right atrial pressure of 3 mmHg. FINDINGS  Left Ventricle: Left ventricular ejection fraction, by  estimation, is 60 to 65%. The left ventricle has normal function. The left ventricle has no regional wall motion abnormalities. The left ventricular internal cavity size was normal in size. There is  no left ventricular hypertrophy. Left ventricular diastolic parameters are indeterminate. Right Ventricle: The right ventricular size is normal. No increase in right ventricular wall thickness. Right ventricular systolic function is normal. There is normal pulmonary artery systolic pressure. The tricuspid regurgitant velocity is 2.40 m/s, and  with an assumed right atrial pressure of 3 mmHg, the estimated right ventricular systolic pressure is 26.0 mmHg. Left  Atrium: Left atrial size was normal in size. Right Atrium: Right atrial size was normal in size. Pericardium: There is no evidence of pericardial effusion. Mitral Valve: The mitral valve is normal in structure. No evidence of mitral valve regurgitation. No evidence of mitral valve stenosis. Tricuspid Valve: The tricuspid valve is normal in structure. Tricuspid valve regurgitation is not demonstrated. No evidence of tricuspid stenosis. Aortic Valve: The aortic valve is calcified. There is moderate calcification of the aortic valve. There is mild thickening of the aortic valve. Aortic valve regurgitation is not visualized. No aortic stenosis is present. Pulmonic Valve: The pulmonic valve was normal in structure. Pulmonic valve regurgitation is not visualized. No evidence of pulmonic stenosis. Aorta: The aortic root is normal in size and structure. Venous: The inferior vena cava is normal in size with greater than 50% respiratory variability, suggesting right atrial pressure of 3 mmHg. IAS/Shunts: No atrial level shunt detected by color flow Doppler.  LEFT VENTRICLE PLAX 2D LVIDd:         3.60 cm   Diastology LVIDs:         2.10 cm   LV e' medial:    7.62 cm/s LV PW:         1.20 cm   LV E/e' medial:  8.7 LV IVS:        1.10 cm   LV e' lateral:   7.18 cm/s LVOT diam:     2.30 cm   LV E/e' lateral: 9.2 LV SV:         112 LV SV Index:   63 LVOT Area:     4.15 cm  RIGHT VENTRICLE             IVC RV S prime:     13.70 cm/s  IVC diam: 1.60 cm TAPSE (M-mode): 1.8 cm LEFT ATRIUM             Index        RIGHT ATRIUM           Index LA diam:        2.90 cm 1.64 cm/m   RA Area:     11.70 cm LA Vol (A2C):   37.4 ml 21.17 ml/m  RA Volume:   22.60 ml  12.79 ml/m LA Vol (A4C):   34.8 ml 19.70 ml/m LA Biplane Vol: 36.2 ml 20.49 ml/m  AORTIC VALVE LVOT Vmax:   113.00 cm/s LVOT Vmean:  88.300 cm/s LVOT VTI:    0.269 m  AORTA Ao Root diam: 3.70 cm Ao Asc diam:  4.10 cm MITRAL VALVE               TRICUSPID VALVE MV Area (PHT):  4.21 cm    TR Peak grad:   23.0 mmHg MV Decel Time: 180 msec    TR Vmax:        240.00 cm/s MV E velocity: 66.40 cm/s MV  A velocity: 79.70 cm/s  SHUNTS MV E/A ratio:  0.83        Systemic VTI:  0.27 m                            Systemic Diam: 2.30 cm Aditya Sabharwal Electronically signed by Dorthula Nettles Signature Date/Time: 06/11/2023/4:24:28 PM    Final    DG CHEST PORT 1 VIEW  Result Date: 06/10/2023 CLINICAL DATA:  Shortness of breath EXAM: PORTABLE CHEST 1 VIEW COMPARISON:  04/21/2023 FINDINGS: Stable cardiomediastinal contours. Unchanged blunting of the left costophrenic angle likely related to prominent pericardial fat. Aortic atherosclerosis. No focal airspace consolidation, pleural effusion, or pneumothorax. Advanced degenerative changes of the left shoulder are again noted. IMPRESSION: No active disease. Electronically Signed   By: Duanne Guess D.O.   On: 06/10/2023 16:13   MR HIP LEFT W WO CONTRAST  Result Date: 06/10/2023 CLINICAL DATA:  Left leg swelling and pain with warmth to touch. Recent hip replacement. EXAM: MRI OF THE LEFT HIP WITHOUT AND WITH CONTRAST TECHNIQUE: Multiplanar, multisequence MR imaging was performed both before and after administration of intravenous contrast. CONTRAST:  7mL GADAVIST GADOBUTROL 1 MMOL/ML IV SOLN COMPARISON:  Radiographs 02/12/2023 and 06/09/2023. Left femur CT 06/09/2023. FINDINGS: Bones/Joint/Cartilage Patient is status post left hip bipolar hemiarthroplasty which occurred before the 02/12/2023 radiographs. The hardware creates moderate susceptibility artifact. Allowing for this artifact, and as correlated with earlier CT, no evidence of acute fracture, dislocation or osteomyelitis. No significant left hip joint effusion. The right hip joint appears unremarkable. The symphysis pubis, sacroiliac joints and remainder of the bony pelvis appear unremarkable. Coronal images demonstrate degenerative disc disease in the lower lumbar spine with a left  lateral disc protrusion at L4-5 causing significant left foraminal narrowing and probable left L4 nerve root encroachment. This finding appears similar to abdominopelvic CT 04/21/2023. Ligaments No ligamentous abnormalities are identified. Muscles and Tendons Mild nonspecific edema within the gluteus minimus muscles, left greater than right. There is also mild edema within the left hip adductor muscles. Mild asymmetric muscular atrophy on the left. No tendon tear or focal fluid collections are identified. There is mild right common hamstring tendinosis. Soft tissue Nonspecific mildly asymmetric subcutaneous edema lateral to the left hip with associated skin thickening. No evidence of focal fluid collection or suspicious enhancement. Unchanged small inguinal hernias bilaterally with a short segment of bowel on the right. IMPRESSION: 1. Nonspecific mildly asymmetric subcutaneous edema lateral to the left hip with associated skin thickening. Nonspecific mild muscular edema and atrophy as described. No evidence of focal fluid collection or suspicious enhancement. 2. Status post left hip bipolar hemiarthroplasty without evidence of complication. No acute osseous findings. 3. Degenerative disc disease in the lower lumbar spine with a left lateral disc protrusion at L4-5 causing significant left foraminal narrowing and probable left L4 nerve root encroachment. This finding appears similar to abdominopelvic CT 04/21/2023. 4. Unchanged small inguinal hernias bilaterally with a short segment of bowel on the right. Electronically Signed   By: Carey Bullocks M.D.   On: 06/10/2023 08:37   CT FOOT LEFT W CONTRAST  Result Date: 06/09/2023 CLINICAL DATA:  Left leg swelling and severe pain. Recent hip replacement. EXAM: CT OF THE LOWER LEFT EXTREMITY WITH CONTRAST TECHNIQUE: Multidetector CT imaging of the lower left extremity was performed according to the standard protocol following intravenous contrast administration.  RADIATION DOSE REDUCTION: This exam was performed according to the departmental dose-optimization program  which includes automated exposure control, adjustment of the mA and/or kV according to patient size and/or use of iterative reconstruction technique. CONTRAST:  OMNIPAQUE IOHEXOL 300 MG/ML  SOLN COMPARISON:  None Available. FINDINGS: Bones/Joint/Cartilage Left THA. No acute fracture or dislocation. No knee joint effusion. Ligaments Suboptimally assessed by CT. Muscles and Tendons There is edema within the posterior compartment musculature in the thigh calf. Additional edema within the plantar muscles of the foot. No organized fluid collection or abscess. The muscles and tendons are grossly intact. Soft tissues Marked soft tissue swelling about the calf. This extends superiorly into the thigh and inferiorly into the dorsum of the foot. No organized fluid collection or abscess. No soft tissue gas. Vascular stent within the superficial femoral artery, popliteal artery, and tibioperoneal trunk. IMPRESSION: 1. Marked soft tissue swelling about the calf extending superiorly into the thigh and inferiorly into the dorsum of the foot. Findings are nonspecific but correlation for cellulitis is recommended. No organized fluid collection or abscess. No soft tissue gas. 2. Nonspecific edema within the posterior compartment musculature in the thigh and calf as well as the plantar muscles of the foot. Query myositis. No abscess. Electronically Signed   By: Minerva Fester M.D.   On: 06/09/2023 19:29   CT FEMUR LEFT W CONTRAST  Result Date: 06/09/2023 CLINICAL DATA:  Left leg swelling and severe pain. Recent hip replacement. EXAM: CT OF THE LOWER LEFT EXTREMITY WITH CONTRAST TECHNIQUE: Multidetector CT imaging of the lower left extremity was performed according to the standard protocol following intravenous contrast administration. RADIATION DOSE REDUCTION: This exam was performed according to the departmental  dose-optimization program which includes automated exposure control, adjustment of the mA and/or kV according to patient size and/or use of iterative reconstruction technique. CONTRAST:  OMNIPAQUE IOHEXOL 300 MG/ML  SOLN COMPARISON:  None Available. FINDINGS: Bones/Joint/Cartilage Left THA. No acute fracture or dislocation. No knee joint effusion. Ligaments Suboptimally assessed by CT. Muscles and Tendons There is edema within the posterior compartment musculature in the thigh calf. Additional edema within the plantar muscles of the foot. No organized fluid collection or abscess. The muscles and tendons are grossly intact. Soft tissues Marked soft tissue swelling about the calf. This extends superiorly into the thigh and inferiorly into the dorsum of the foot. No organized fluid collection or abscess. No soft tissue gas. Vascular stent within the superficial femoral artery, popliteal artery, and tibioperoneal trunk. IMPRESSION: 1. Marked soft tissue swelling about the calf extending superiorly into the thigh and inferiorly into the dorsum of the foot. Findings are nonspecific but correlation for cellulitis is recommended. No organized fluid collection or abscess. No soft tissue gas. 2. Nonspecific edema within the posterior compartment musculature in the thigh and calf as well as the plantar muscles of the foot. Query myositis. No abscess. Electronically Signed   By: Minerva Fester M.D.   On: 06/09/2023 19:29   CT TIBIA FIBULA LEFT W CONTRAST  Result Date: 06/09/2023 CLINICAL DATA:  Left leg swelling and severe pain. Recent hip replacement. EXAM: CT OF THE LOWER LEFT EXTREMITY WITH CONTRAST TECHNIQUE: Multidetector CT imaging of the lower left extremity was performed according to the standard protocol following intravenous contrast administration. RADIATION DOSE REDUCTION: This exam was performed according to the departmental dose-optimization program which includes automated exposure control, adjustment  of the mA and/or kV according to patient size and/or use of iterative reconstruction technique. CONTRAST:  OMNIPAQUE IOHEXOL 300 MG/ML  SOLN COMPARISON:  None Available. FINDINGS: Bones/Joint/Cartilage Left  THA. No acute fracture or dislocation. No knee joint effusion. Ligaments Suboptimally assessed by CT. Muscles and Tendons There is edema within the posterior compartment musculature in the thigh calf. Additional edema within the plantar muscles of the foot. No organized fluid collection or abscess. The muscles and tendons are grossly intact. Soft tissues Marked soft tissue swelling about the calf. This extends superiorly into the thigh and inferiorly into the dorsum of the foot. No organized fluid collection or abscess. No soft tissue gas. Vascular stent within the superficial femoral artery, popliteal artery, and tibioperoneal trunk. IMPRESSION: 1. Marked soft tissue swelling about the calf extending superiorly into the thigh and inferiorly into the dorsum of the foot. Findings are nonspecific but correlation for cellulitis is recommended. No organized fluid collection or abscess. No soft tissue gas. 2. Nonspecific edema within the posterior compartment musculature in the thigh and calf as well as the plantar muscles of the foot. Query myositis. No abscess. Electronically Signed   By: Minerva Fester M.D.   On: 06/09/2023 19:29   DG Hip Unilat W or Wo Pelvis 2-3 Views Left  Result Date: 06/09/2023 CLINICAL DATA:  Left leg swelling. EXAM: DG HIP (WITH OR WITHOUT PELVIS) 2-3V LEFT COMPARISON:  February 12, 2023. FINDINGS: Status post left hip arthroplasty. No acute fracture or dislocation is noted. IMPRESSION: No acute abnormality seen. Electronically Signed   By: Lupita Raider M.D.   On: 06/09/2023 16:10   VAS [place] ABI WITH/WO TBI  Result Date: 05/25/2023  LOWER EXTREMITY DOPPLER STUDY Patient Name:  Howard Jackson  Date of Exam:   05/25/2023 Medical Rec #: [id number]             Accession #:     [id number] Date of Birth: [birth year]              Patient Gender: M Patient Age:   28 years Exam Location:  [facility] Procedure:      VAS [place] ABI WITH/WO TBI Referring Phys: Sherald Hess --------------------------------------------------------------------------------  Indications: Peripheral artery disease. High Risk Factors: Hypertension, hyperlipidemia, no history of smoking, coronary                    artery disease.  Vascular Interventions: 04/23/23 Angioplasty and stent of left above and                         below-knee popliteal artery and TP trunk (5 mm x 140 mm                         Zilver PTX postdilated with a 4 mm balloon), angioplasty                         and stent of the left SFA (6 mm x 150 mm drug-coated                         Eluvia x 2 postdilated with a 5 mm balloon), left PT                         angioplasty (3 mm x 100 mm Sterling). Limitations: Today's exam was limited due to Restricted mobility, exam performed              with patient in motorized scooter. Comparison Study: 03/31/23 ABI/TBI- right=0.62/0.32, left=0.57/0.26  Performing Technologist: Gertie Fey MHA, RVT, RDCS, RDMS  Examination Guidelines: A complete evaluation includes at minimum, Doppler waveform signals and systolic blood pressure reading at the level of bilateral brachial, anterior tibial, and posterior tibial arteries, when vessel segments are accessible. Bilateral testing is considered an integral part of a complete examination. Photoelectric Plethysmograph (PPG) waveforms and toe systolic pressure readings are included as required and additional duplex testing as needed. Limited examinations for reoccurring indications may be performed as noted.  ABI Findings: +--------+------------------+-----+----------+--------+ Right   Rt Pressure (mmHg)IndexWaveform  Comment  +--------+------------------+-----+----------+--------+ [id number]                                        +--------+------------------+-----+----------+--------+ ATA     51                0.40 monophasic         +--------+------------------+-----+----------+--------+ PTA     54                0.43 monophasic         +--------+------------------+-----+----------+--------+ +---------+------------------+-----+---------+-------+ Left     Lt Pressure (mmHg)IndexWaveform Comment +---------+------------------+-----+---------+-------+ ATA      106               0.84 triphasic        +---------+------------------+-----+---------+-------+ PTA      132               1.05 triphasic        +---------+------------------+-----+---------+-------+ Great Toe52                0.41                  +---------+------------------+-----+---------+-------+ +-------+-----------+-----------+------------+------------+ ABI/TBIToday's ABIToday's TBIPrevious ABIPrevious TBI +-------+-----------+-----------+------------+------------+ Right  0.43       0.0        0.62        0.32         +-------+-----------+-----------+------------+------------+ Left   1.05       0.41       0.57        0.26         +-------+-----------+-----------+------------+------------+  Right ABIs and TBIs appear decreased compared to prior study on 03/31/23. Left ABIs and TBIs appear increased compared to prior study on 03/31/23.  Summary: Right: Resting right ankle-brachial index indicates severe right lower extremity arterial disease. The right toe-brachial index is abnormal. Left: Resting left ankle-brachial index is within normal range. The left toe-brachial index is abnormal. *See table(s) above for measurements and observations.  Electronically signed by Coral Else MD on 05/25/2023 at 3:43:16 PM.    Final    VAS [place] LOWER EXTREMITY ARTERIAL DUPLEX  Result Date: 05/25/2023 LOWER EXTREMITY ARTERIAL DUPLEX STUDY Patient Name:  Howard Jackson [facility]  Date of Exam:   05/25/2023 Medical Rec #: [id number]              Accession #:    [id number] Date of Birth: 1945-10-03              Patient Gender: M Patient Age:   39 years Exam Location:  [facility] Procedure:      VAS [place] LOWER EXTREMITY ARTERIAL DUPLEX Referring Phys: Sherald Hess --------------------------------------------------------------------------------  Indications: Ulceration, and peripheral artery disease. High Risk Factors: Hypertension, hyperlipidemia, no history of smoking, coronary  artery disease.  Vascular Interventions: 04/23/23 Angioplasty and stent of left above and                         below-knee popliteal artery and TP trunk (5 mm x 140 mm                         Zilver PTX postdilated with a 4 mm balloon), angioplasty                         and stent of the left SFA (6 mm x 150 mm drug-coated                         Eluvia x 2 postdilated with a 5 mm balloon), left PT                         angioplasty (3 mm x 100 mm Sterling). Current ABI:            right=0.43/0.00, left=1.05/0.41 Comparison Study: 05/18/2015 Bilateral lower extremity arterial duplex- No                   significant aorto- iliac stenosis. Patent proximal internal                   iliac arteries. Interval short segment occlusion of the mid/                   distal left SFA. Known occlusive disease of the tibial                   arteries on the left. Performing Technologist: Gertie Fey MHA, RDMS, RVT, RDCS  Examination Guidelines: A complete evaluation includes B-mode imaging, spectral Doppler, color Doppler, and power Doppler as needed of all accessible portions of each vessel. Bilateral testing is considered an integral part of a complete examination. Limited examinations for reoccurring indications may be performed as noted.   Left Stent(s): +---------------+--------+--------+----------+--------+ SFA-pop-TPT    PSV cm/sStenosisWaveform  Comments +---------------+--------+--------+----------+--------+ Prox to Stent  104              triphasic          +---------------+--------+--------+----------+--------+ Proximal Stent 78              triphasic          +---------------+--------+--------+----------+--------+ Mid Stent      59              monophasic         +---------------+--------+--------+----------+--------+ Distal Stent   53              monophasic         +---------------+--------+--------+----------+--------+ Distal to Stent79              monophasic         +---------------+--------+--------+----------+--------+    +-----------+--------+-----+---------------+----------+------------------------+ LEFT       PSV cm/sRatioStenosis       Waveform  Comments                 +-----------+--------+-----+---------------+----------+------------------------+ CFA Distal 89                          triphasic                          +-----------+--------+-----+---------------+----------+------------------------+  DFA        180          30-49% stenosisbiphasic                           +-----------+--------+-----+---------------+----------+------------------------+ SFA Prox   193          30-49% stenosistriphasic                          +-----------+--------+-----+---------------+----------+------------------------+ ATA Distal                                       Unable to insonate-                                                       collateral flow noted    +-----------+--------+-----+---------------+----------+------------------------+ PTA Prox   116                         monophasic                         +-----------+--------+-----+---------------+----------+------------------------+ PTA Mid    144                         monophasic                         +-----------+--------+-----+---------------+----------+------------------------+ PTA Distal 247          50-74% stenosismonophasic                          +-----------+--------+-----+---------------+----------+------------------------+ PERO Mid   35                          monophasic                         +-----------+--------+-----+---------------+----------+------------------------+ PERO Distal11                          monophasic                         +-----------+--------+-----+---------------+----------+------------------------+  Summary: Left: 30-49% stenosis noted in the deep femoral artery. 30-49% stenosis noted in the superficial femoral artery. 50-74% stenosis noted in the posterior tibial artery. Patent stent with no evidence of stenosis in the superficial femoral and popliteal artery artery.  See table(s) above for measurements and observations. Electronically signed by Coral Else MD on 05/25/2023 at 3:41:53 PM.    Final    US Carotid Bilateral  Result Date: 05/25/2023 CLINICAL DATA:  77 year old male with carotid stenosis EXAM: BILATERAL CAROTID DUPLEX ULTRASOUND TECHNIQUE: Wallace Cullens scale imaging, color Doppler and duplex ultrasound were performed of bilateral carotid and vertebral arteries in the neck. COMPARISON:  None Available. FINDINGS: Criteria: Quantification of carotid stenosis is based on velocity parameters that correlate the residual internal carotid diameter with NASCET-based stenosis levels, using the diameter of the distal internal carotid lumen as the denominator for  stenosis measurement. The following velocity measurements were obtained: RIGHT ICA:  Systolic 80 cm/sec, Diastolic 16 cm/sec CCA:  67 cm/sec SYSTOLIC ICA/CCA RATIO:  1.2 ECA:  57 cm/sec LEFT ICA:  Systolic 88 cm/sec, Diastolic 22 cm/sec CCA:  89 cm/sec SYSTOLIC ICA/CCA RATIO:  1.0 ECA:  93 cm/sec Right Brachial SBP: Not acquired Left Brachial SBP: Not acquired RIGHT CAROTID ARTERY: No significant calcified disease of the right common carotid artery. Intermediate waveform maintained. Homogeneous plaque without significant calcifications at the right  carotid bifurcation. Low resistance waveform of the right ICA. No significant tortuosity. RIGHT VERTEBRAL ARTERY: Antegrade flow with low resistance waveform. LEFT CAROTID ARTERY: No significant calcified disease of the left common carotid artery. Intermediate waveform maintained. Homogeneous plaque at the left carotid bifurcation without significant calcifications. Low resistance waveform of the left ICA. LEFT VERTEBRAL ARTERY:  Antegrade flow with low resistance waveform. IMPRESSION: Color duplex indicates minimal homogeneous plaque, with no hemodynamically significant stenosis by duplex criteria in the extracranial cerebrovascular circulation. Signed, Yvone Neu. Miachel Roux, RPVI [facility] Electronically Signed   By: Gilmer Mor D.O.   On: 05/25/2023 15:25   (Echo, Carotid, EGD, Colonoscopy, ERCP)    Subjective: No plaints  Discharge Exam: Vitals:   06/17/23 2127 06/18/23 0443  BP: 138/69 139/69  Pulse: 79 73  Resp:    Temp: 98.2 F (36.8 C) 98.2 F (36.8 C)  SpO2: 98% 99%   Vitals:   06/17/23 1452 06/17/23 2127 06/18/23 0443 06/18/23 0445  BP: 126/64 138/69 139/69   Pulse: 90 79 73   Resp:      Temp: 98.7 F (37.1 C) 98.2 F (36.8 C) 98.2 F (36.8 C)   TempSrc: Oral Oral Oral   SpO2: 98% 98% 99%   Weight:    64.9 kg  Height:        General: Pt is alert, awake, not in acute distress Cardiovascular: RRR, S1/S2 +, no rubs, no gallops Respiratory: CTA bilaterally, no wheezing, no rhonchi Abdominal: Soft, NT, ND, bowel sounds + Extremities: no edema, no cyanosis    The results of significant diagnostics from this hospitalization (including imaging, microbiology, ancillary and laboratory) are listed below for reference.     Microbiology: Recent Results (from the past 240 hour(s))  Culture, blood (Routine X 2) w Reflex to ID Panel     Status: Abnormal   Collection Time: 06/10/23  3:00 AM   Specimen: BLOOD  RIGHT HAND  Result Value Ref Range Status   Specimen Description   Final    BLOOD RIGHT HAND Performed at [facility], [address] [place], [place] [address]    Special Requests   Final    BOTTLES DRAWN AEROBIC AND ANAEROBIC Blood Culture adequate volume Performed at [facility], [address] [place], [place] [address]    Culture  Setup Time   Final    GRAM POSITIVE COCCI IN CLUSTERS IN BOTH AEROBIC AND ANAEROBIC BOTTLES CRITICAL RESULT CALLED TO, READ BACK BY AND VERIFIED WITH:  Eula Flax [id number] @ 2036 FH Performed at [facility], [address], [place], [place] [address]    Culture STAPHYLOCOCCUS AUREUS (A)  Final   Report Status 06/12/2023 FINAL  Final   Organism ID, Bacteria STAPHYLOCOCCUS AUREUS  Final      Susceptibility   Staphylococcus aureus - MIC*    CIPROFLOXACIN <=0.5 SENSITIVE Sensitive     ERYTHROMYCIN >=8 RESISTANT Resistant  GENTAMICIN <=0.5 SENSITIVE Sensitive     OXACILLIN <=0.25 SENSITIVE Sensitive     TETRACYCLINE <=1 SENSITIVE Sensitive     VANCOMYCIN <=0.5 SENSITIVE Sensitive     TRIMETH/SULFA <=10 SENSITIVE Sensitive     CLINDAMYCIN RESISTANT Resistant     RIFAMPIN <=0.5 SENSITIVE Sensitive     Inducible Clindamycin POSITIVE Resistant     LINEZOLID 2 SENSITIVE Sensitive     * STAPHYLOCOCCUS AUREUS  Blood Culture ID Panel (Reflexed)     Status: Abnormal   Collection Time: 06/10/23  3:00 AM  Result Value Ref Range Status   Enterococcus faecalis NOT DETECTED NOT DETECTED Final   Enterococcus Faecium NOT DETECTED NOT DETECTED Final   Listeria monocytogenes NOT DETECTED NOT DETECTED Final   Staphylococcus species DETECTED (A) NOT DETECTED Final    Comment: CRITICAL RESULT CALLED TO, READ BACK BY AND VERIFIED WITH: PHARMD D. ZOXWRUE [id number] @ 2036 FH    Staphylococcus aureus (BCID) DETECTED (A) NOT DETECTED Final    Comment: CRITICAL RESULT CALLED TO, READ BACK BY AND VERIFIED WITH: PHARMD D. [id number]  [id number] @ 2036 FH    Staphylococcus epidermidis NOT DETECTED NOT DETECTED Final   Staphylococcus lugdunensis NOT DETECTED NOT DETECTED Final   Streptococcus species NOT DETECTED NOT DETECTED Final   Streptococcus agalactiae NOT DETECTED NOT DETECTED Final   Streptococcus pneumoniae NOT DETECTED NOT DETECTED Final   Streptococcus pyogenes NOT DETECTED NOT DETECTED Final   A.calcoaceticus-baumannii NOT DETECTED NOT DETECTED Final   Bacteroides fragilis NOT DETECTED NOT DETECTED Final   Enterobacterales NOT DETECTED NOT DETECTED Final   Enterobacter cloacae complex NOT DETECTED NOT DETECTED Final   Escherichia coli NOT DETECTED NOT DETECTED Final   Klebsiella aerogenes NOT DETECTED NOT DETECTED Final   Klebsiella oxytoca NOT DETECTED NOT DETECTED Final   Klebsiella pneumoniae NOT DETECTED NOT DETECTED Final   Proteus species NOT DETECTED NOT DETECTED Final   Salmonella species NOT DETECTED NOT DETECTED Final   Serratia marcescens NOT DETECTED NOT DETECTED Final   Haemophilus influenzae NOT DETECTED NOT DETECTED Final   Neisseria meningitidis NOT DETECTED NOT DETECTED Final   Pseudomonas aeruginosa NOT DETECTED NOT DETECTED Final   Stenotrophomonas maltophilia NOT DETECTED NOT DETECTED Final   Candida albicans NOT DETECTED NOT DETECTED Final   Candida auris NOT DETECTED NOT DETECTED Final   Candida glabrata NOT DETECTED NOT DETECTED Final   Candida krusei NOT DETECTED NOT DETECTED Final   Candida parapsilosis NOT DETECTED NOT DETECTED Final   Candida tropicalis NOT DETECTED NOT DETECTED Final   Cryptococcus neoformans/gattii NOT DETECTED NOT DETECTED Final   Meth resistant mecA/C and MREJ NOT DETECTED NOT DETECTED Final    Comment: Performed at [facility], [address], [place], [place] [address]  Culture, blood (Routine X 2) w Reflex to ID Panel     Status: Abnormal   Collection Time: 06/10/23  6:05 AM   Specimen: BLOOD  Result Value Ref Range Status   Specimen Description    Final    BLOOD RIGHT ANTECUBITAL Performed at [facility], [address] [place], [place] [address]    Special Requests   Final    BOTTLES DRAWN AEROBIC AND ANAEROBIC Blood Culture adequate volume Performed at [facility], [address] [place], [place] [address]    Culture  Setup Time   Final    GRAM POSITIVE COCCI IN CLUSTERS IN BOTH AEROBIC AND ANAEROBIC BOTTLES CRITICAL VALUE NOTED.  VALUE IS CONSISTENT WITH PREVIOUSLY REPORTED  AND CALLED VALUE.    Culture (A)  Final    STAPHYLOCOCCUS AUREUS SUSCEPTIBILITIES PERFORMED ON PREVIOUS CULTURE WITHIN THE LAST 5 DAYS. Performed at [facility], [address], [place], [place] [address]    Report Status 06/12/2023 FINAL  Final  Culture, blood (Routine X 2) w Reflex to ID Panel     Status: None   Collection Time: 06/12/23  5:35 AM   Specimen: BLOOD  Result Value Ref Range Status   Specimen Description   Final    BLOOD BLOOD LEFT ARM Performed at [facility], [address], [place], [place] [address]    Special Requests   Final    BOTTLES DRAWN AEROBIC ONLY Blood Culture adequate volume Performed at [facility], [address], [place], [place] [address]    Culture   Final    NO GROWTH 5 DAYS Performed at [facility], [address], [place], [place] [address]    Report Status 06/17/2023 FINAL  Final  Culture, blood (Routine X 2) w Reflex to ID Panel     Status: None   Collection Time: 06/12/23  5:35 AM   Specimen: BLOOD  Result Value Ref Range Status   Specimen Description   Final    BLOOD BLOOD LEFT HAND Performed at [facility], [address] [place], [place] [address]    Special Requests   Final    BOTTLES DRAWN AEROBIC ONLY Blood Culture adequate volume Performed at [facility], [address], [place], [place] [address]    Culture   Final    NO GROWTH 5 DAYS Performed at [facility], [address] [place], [place] [address]    Report Status 06/17/2023 FINAL  Final     Labs: BNP (last 3 results) No results for input(s): "BNP" in the last 8760 hours. Basic Metabolic Panel: Recent Labs  Lab 06/14/23 0458 06/15/23 0936 06/16/23 0517 06/17/23 0535 06/18/23 0303  NA 126* 128* 125* 128* 128*  K 5.2* 4.6 4.2 3.9 4.3  CL 92* 92* 90* 93* 93*  CO2 26 25 26 26 28   GLUCOSE 278* 305* 169* 221* 210*  BUN 38* 40* 34* 40* 42*  CREATININE 0.85 0.75 0.47* 0.68 0.70  CALCIUM 8.3* 8.6* 8.4* 8.0* 7.7*  MG 1.9 1.8 1.7 1.9 1.9   Liver Function Tests: No results for input(s): "AST", "ALT", "ALKPHOS", "BILITOT", "PROT", "ALBUMIN" in the last 168 hours. No results for input(s): "LIPASE", "AMYLASE" in the last 168 hours. No results for input(s): "AMMONIA" in the last 168 hours. CBC: Recent Labs  Lab 06/14/23 0458 06/15/23 0936 06/16/23 0517 06/17/23 0535 06/18/23 0303  WBC 15.2* 17.4* 13.7* 16.3* 15.7*  HGB 11.4* 12.3* 12.9* 11.5* 11.3*  HCT 34.9* 40.4 39.8 34.8* 34.7*  MCV 101.7* 107.2* 102.6* 101.8* 102.4*  PLT 186 211 207 213 227   Cardiac Enzymes: No results for input(s): "CKTOTAL", "CKMB", "CKMBINDEX", "TROPONINI" in the last 168 hours. BNP: Invalid input(s): "POCBNP" CBG: Recent Labs  Lab 06/15/23 1446  GLUCAP 190*   D-Dimer No results for input(s): "DDIMER" in the last 72 hours. Hgb A1c No results for input(s): "HGBA1C" in the last 72 hours. Lipid Profile No results for input(s): "CHOL", "HDL", "LDLCALC", "TRIG", "CHOLHDL", "LDLDIRECT" in the last 72 hours. Thyroid function studies No results for input(s): "TSH", "T4TOTAL", "T3FREE", "THYROIDAB" in the last 72 hours.  Invalid input(s): "FREET3" Anemia work up No results for input(s): "VITAMINB12", "FOLATE", "  FERRITIN", "TIBC", "IRON", "RETICCTPCT" in the last 72 hours. Urinalysis    Component Value Date/Time   COLORURINE YELLOW 04/21/2023 1134   APPEARANCEUR CLEAR 04/21/2023 1134    LABSPEC 1.025 04/21/2023 1134   PHURINE 6.0 04/21/2023 1134   GLUCOSEU NEGATIVE 04/21/2023 1134   HGBUR NEGATIVE 04/21/2023 1134   BILIRUBINUR NEGATIVE 04/21/2023 1134   KETONESUR NEGATIVE 04/21/2023 1134   PROTEINUR NEGATIVE 04/21/2023 1134   UROBILINOGEN 0.2 11/25/2016 1448   NITRITE NEGATIVE 04/21/2023 1134   LEUKOCYTESUR NEGATIVE 04/21/2023 1134   Sepsis Labs Recent Labs  Lab 06/15/23 0936 06/16/23 0517 06/17/23 0535 06/18/23 0303  WBC 17.4* 13.7* 16.3* 15.7*   Microbiology Recent Results (from the past 240 hour(s))  Culture, blood (Routine X 2) w Reflex to ID Panel     Status: Abnormal   Collection Time: 06/10/23  3:00 AM   Specimen: BLOOD RIGHT HAND  Result Value Ref Range Status   Specimen Description   Final    BLOOD RIGHT HAND Performed at [facility], [address] [place], [place] [address]    Special Requests   Final    BOTTLES DRAWN AEROBIC AND ANAEROBIC Blood Culture adequate volume Performed at [facility], [address], [place], [place] [address]    Culture  Setup Time   Final    GRAM POSITIVE COCCI IN CLUSTERS IN BOTH AEROBIC AND ANAEROBIC BOTTLES CRITICAL RESULT CALLED TO, READ BACK BY AND VERIFIED WITH:  Eula Flax [id number] @ 2036 FH Performed at [facility], [address] [place], [place] [address]    Culture STAPHYLOCOCCUS AUREUS (A)  Final   Report Status 06/12/2023 FINAL  Final   Organism ID, Bacteria STAPHYLOCOCCUS AUREUS  Final      Susceptibility   Staphylococcus aureus - MIC*    CIPROFLOXACIN <=0.5 SENSITIVE Sensitive     ERYTHROMYCIN >=8 RESISTANT Resistant     GENTAMICIN <=0.5 SENSITIVE Sensitive     OXACILLIN <=0.25 SENSITIVE Sensitive     TETRACYCLINE <=1 SENSITIVE Sensitive     VANCOMYCIN <=0.5 SENSITIVE Sensitive     TRIMETH/SULFA <=10 SENSITIVE Sensitive     CLINDAMYCIN RESISTANT Resistant     RIFAMPIN <=0.5 SENSITIVE Sensitive     Inducible Clindamycin POSITIVE Resistant      LINEZOLID 2 SENSITIVE Sensitive     * STAPHYLOCOCCUS AUREUS  Blood Culture ID Panel (Reflexed)     Status: Abnormal   Collection Time: 06/10/23  3:00 AM  Result Value Ref Range Status   Enterococcus faecalis NOT DETECTED NOT DETECTED Final   Enterococcus Faecium NOT DETECTED NOT DETECTED Final   Listeria monocytogenes NOT DETECTED NOT DETECTED Final   Staphylococcus species DETECTED (A) NOT DETECTED Final    Comment: CRITICAL RESULT CALLED TO, READ BACK BY AND VERIFIED WITH: PHARMD D. WOFFORD [id number] @ 2036 FH    Staphylococcus aureus (BCID) DETECTED (A) NOT DETECTED Final    Comment: CRITICAL RESULT CALLED TO, READ BACK BY AND VERIFIED WITH: PHARMD D. [id number] @ 2036 FH    Staphylococcus epidermidis NOT DETECTED NOT DETECTED Final   Staphylococcus lugdunensis NOT DETECTED NOT DETECTED Final   Streptococcus species NOT DETECTED NOT DETECTED Final   Streptococcus agalactiae NOT DETECTED NOT DETECTED Final   Streptococcus pneumoniae NOT DETECTED NOT DETECTED Final   Streptococcus pyogenes NOT DETECTED NOT DETECTED Final   A.calcoaceticus-baumannii NOT DETECTED NOT DETECTED Final   Bacteroides fragilis NOT DETECTED NOT DETECTED Final   Enterobacterales NOT DETECTED NOT DETECTED Final  Enterobacter cloacae complex NOT DETECTED NOT DETECTED Final   Escherichia coli NOT DETECTED NOT DETECTED Final   Klebsiella aerogenes NOT DETECTED NOT DETECTED Final   Klebsiella oxytoca NOT DETECTED NOT DETECTED Final   Klebsiella pneumoniae NOT DETECTED NOT DETECTED Final   Proteus species NOT DETECTED NOT DETECTED Final   Salmonella species NOT DETECTED NOT DETECTED Final   Serratia marcescens NOT DETECTED NOT DETECTED Final   Haemophilus influenzae NOT DETECTED NOT DETECTED Final   Neisseria meningitidis NOT DETECTED NOT DETECTED Final   Pseudomonas aeruginosa NOT DETECTED NOT DETECTED Final   Stenotrophomonas maltophilia NOT DETECTED NOT DETECTED Final   Candida albicans NOT DETECTED NOT  DETECTED Final   Candida auris NOT DETECTED NOT DETECTED Final   Candida glabrata NOT DETECTED NOT DETECTED Final   Candida krusei NOT DETECTED NOT DETECTED Final   Candida parapsilosis NOT DETECTED NOT DETECTED Final   Candida tropicalis NOT DETECTED NOT DETECTED Final   Cryptococcus neoformans/gattii NOT DETECTED NOT DETECTED Final   Meth resistant mecA/C and MREJ NOT DETECTED NOT DETECTED Final    Comment: Performed at [facility], [address] [place], [place] [address]  Culture, blood (Routine X 2) w Reflex to ID Panel     Status: Abnormal   Collection Time: 06/10/23  6:05 AM   Specimen: BLOOD  Result Value Ref Range Status   Specimen Description   Final    BLOOD RIGHT ANTECUBITAL Performed at [facility], [address], [place], [place] [address]    Special Requests   Final    BOTTLES DRAWN AEROBIC AND ANAEROBIC Blood Culture adequate volume Performed at [facility], [address], [place], [place] [address]    Culture  Setup Time   Final    GRAM POSITIVE COCCI IN CLUSTERS IN BOTH AEROBIC AND ANAEROBIC BOTTLES CRITICAL VALUE NOTED.  VALUE IS CONSISTENT WITH PREVIOUSLY REPORTED AND CALLED VALUE.    Culture (A)  Final    STAPHYLOCOCCUS AUREUS SUSCEPTIBILITIES PERFORMED ON PREVIOUS CULTURE WITHIN THE LAST 5 DAYS. Performed at [facility], [address], [place], [place] [address]    Report Status 06/12/2023 FINAL  Final  Culture, blood (Routine X 2) w Reflex to ID Panel     Status: None   Collection Time: 06/12/23  5:35 AM   Specimen: BLOOD  Result Value Ref Range Status   Specimen Description   Final    BLOOD BLOOD LEFT ARM Performed at [facility], [address], [place], [place] [address]    Special Requests   Final    BOTTLES DRAWN AEROBIC ONLY Blood Culture adequate volume Performed at [facility], [address], [place], [place] [address]    Culture   Final    NO  GROWTH 5 DAYS Performed at [facility], [address], [place], [place] [address]    Report Status 06/17/2023 FINAL  Final  Culture, blood (Routine X 2) w Reflex to ID Panel     Status: None   Collection Time: 06/12/23  5:35 AM   Specimen: BLOOD  Result Value Ref Range Status   Specimen Description   Final    BLOOD BLOOD LEFT HAND Performed at [facility], [address], [place], [place] [address]    Special Requests   Final    BOTTLES DRAWN AEROBIC ONLY Blood Culture adequate volume Performed at [facility], [address], [place], [place] [address]  Culture   Final    NO GROWTH 5 DAYS Performed at [facility], [address], [place], [place] [address]    Report Status 06/17/2023 FINAL  Final     Time coordinating discharge: Over 35 minutes  SIGNED:   Marinda Elk, MD  [facility] 06/18/2023, 10:05 AM Pager   If 7PM-7AM, please contact night-coverage [email or web address] Password [email or web address]

## 2023-06-18 NOTE — Plan of Care (Signed)

## 2023-06-18 NOTE — Progress Notes (Signed)
[facility] PROGRESS NOTE    Progress Note  Howard Jackson  UYQ:[id number] DOB: 07-04-1946 DOA: 06/09/2023 [facility]CP[facility]     Brief Narrative:   Howard Jackson is an 77 y.o. 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 Currently be treating for cellulitis and MSSA bacteremia. TEE negative for endocarditis  Assessment/Plan:   Left leg cellulitis with MSSA bacteremia: Septic arthritis and abscess have been ruled out. CT and MRI were unremarkable. Currently on via cefazolin, TEE negative for endocarditis. Blood cultures so far have been negative. End date of antibiotics 06/25/2023. Follow-up with ID on 06/30/2023.  PICC line in place 05/27/2023. Currently awaiting skilled nursing facility placement.  Incidental small PFO finding: Noted.  Dilated ascending aorta: Follow-up with cardiology as an outpatient.  History of temporal arthritis: Follow-up with vascular neurology as an outpatient. Continue steroids at current dose.  Chronic hyponatremia: Currently asymptomatic.  Has remained stable over the last several months ranges from 1 26-1 32.  History of CVA: Continue aspirin and Plavix and Crestor.  Chronic hyponatremia: Noted.  History of diastolic dysfunction/essential hypertension: Continue labetalol Lasix and Aldactone.  History of CAD: Continue stenting and aspirin.  Reactive airway disease: Continue Xopenex.  BPH: Continue Flomax.  Constipation: Continue bowel regimen.  Primary hyperaldosteronism: Resume Aldactone at a lower dose, potassium is stabilized.  Hyperglycemia:    DVT prophylaxis: lovenox Family Communication:none Status is:  Inpatient Remains inpatient appropriate because: Bacteremia MSSA    Code Status:     Code Status Orders  (From admission, onward)           Start     Ordered   06/10/23 0208  Full code  Continuous       Question:  By:  Answer:  Consent: discussion documented in EHR   06/10/23 0208           Code Status History     Date Active Date Inactive Code Status Order ID Comments User Context   04/23/2023 1539 04/24/2023 1949 Full Code [id number]  Dara Lords, PA-C Inpatient   04/23/2023 1034 04/23/2023 1539 Full Code [id number]  Cephus Shelling, MD Inpatient   02/12/2023 1631 02/23/2023 1714 Full Code [id number]  Tonye Royalty, DO ED   12/17/2022 2145 12/20/2022 0338 Full Code [id number]  Therisa Doyne, MD ED   04/14/2018 2352 04/20/2018 1732 Full Code [id number]  Lorretta Harp, MD ED   11/18/2012 0957 11/19/2012 1445 Full Code [id number]  Shelly Rubenstein, MD Inpatient   05/25/2011 1102 05/25/2011 1434 Full Code [id number]  Blanchie Dessert, RN ED      Advance Directive Documentation    Flowsheet Row Most Recent Value  Type of Advance Directive Healthcare Power of Attorney, Living will  Pre-existing out of facility DNR order (yellow form or pink MOST form) --  "MOST" Form in Place? --         IV Access:   Peripheral IV   Procedures and diagnostic studies:   No results found.   Medical Consultants:   None.   Subjective:    Howard Jackson no complaints.  Objective:    Vitals:   06/17/23 1452 06/17/23 2127 06/18/23 0443 06/18/23 0445  BP: 126/64 138/69 139/69   Pulse: 90 79 73   Resp:  Temp: 98.7 F (37.1 C) 98.2 F (36.8 C) 98.2 F (36.8 C)   TempSrc: Oral Oral Oral   SpO2: 98% 98% 99%   Weight:    64.9 kg  Height:       SpO2: 99 %   Intake/Output Summary (Last 24 hours) at 06/18/2023 0948 Last data filed at 06/18/2023 0900 Gross per 24 hour  Intake 600 ml  Output 1400 ml  Net -800 ml   Filed Weights   06/15/23 0500 06/16/23  0500 06/18/23 0445  Weight: 63.5 kg 63.2 kg 64.9 kg    Exam: General exam: In no acute distress. Respiratory system: Good air movement and clear to auscultation. Cardiovascular system: S1 & S2 heard, RRR. No JVD. Gastrointestinal system: Abdomen is nondistended, soft and nontender.  Extremities: No pedal edema. Skin: No rashes, lesions or ulcers Psychiatry: Judgement and insight appear normal. Mood & affect appropriate.    Data Reviewed:    Labs: Basic Metabolic Panel: Recent Labs  Lab 06/14/23 0458 06/15/23 0936 06/16/23 0517 06/17/23 0535 06/18/23 0303  NA 126* 128* 125* 128* 128*  K 5.2* 4.6 4.2 3.9 4.3  CL 92* 92* 90* 93* 93*  CO2 26 25 26 26 28   GLUCOSE 278* 305* 169* 221* 210*  BUN 38* 40* 34* 40* 42*  CREATININE 0.85 0.75 0.47* 0.68 0.70  CALCIUM 8.3* 8.6* 8.4* 8.0* 7.7*  MG 1.9 1.8 1.7 1.9 1.9   GFR Estimated Creatinine Clearance: 68.7 mL/min (by C-G formula based on SCr of 0.7 mg/dL). Liver Function Tests: No results for input(s): "AST", "ALT", "ALKPHOS", "BILITOT", "PROT", "ALBUMIN" in the last 168 hours. No results for input(s): "LIPASE", "AMYLASE" in the last 168 hours. No results for input(s): "AMMONIA" in the last 168 hours. Coagulation profile No results for input(s): "INR", "PROTIME" in the last 168 hours. COVID-19 Labs  No results for input(s): "DDIMER", "FERRITIN", "LDH", "CRP" in the last 72 hours.  Lab Results  Component Value Date   SARSCOV2NAA NEGATIVE 12/17/2022   SARSCOV2NAA NEGATIVE 12/03/2022    CBC: Recent Labs  Lab 06/14/23 0458 06/15/23 0936 06/16/23 0517 06/17/23 0535 06/18/23 0303  WBC 15.2* 17.4* 13.7* 16.3* 15.7*  HGB 11.4* 12.3* 12.9* 11.5* 11.3*  HCT 34.9* 40.4 39.8 34.8* 34.7*  MCV 101.7* 107.2* 102.6* 101.8* 102.4*  PLT 186 211 207 213 227   Cardiac Enzymes: No results for input(s): "CKTOTAL", "CKMB", "CKMBINDEX", "TROPONINI" in the last 168 hours. BNP (last 3 results) No results for input(s): "PROBNP" in the  last 8760 hours. CBG: Recent Labs  Lab 06/15/23 1446  GLUCAP 190*   D-Dimer: No results for input(s): "DDIMER" in the last 72 hours. Hgb A1c: No results for input(s): "HGBA1C" in the last 72 hours. Lipid Profile: No results for input(s): "CHOL", "HDL", "LDLCALC", "TRIG", "CHOLHDL", "LDLDIRECT" in the last 72 hours. Thyroid function studies: No results for input(s): "TSH", "T4TOTAL", "T3FREE", "THYROIDAB" in the last 72 hours.  Invalid input(s): "FREET3" Anemia work up: No results for input(s): "VITAMINB12", "FOLATE", "FERRITIN", "TIBC", "IRON", "RETICCTPCT" in the last 72 hours. Sepsis Labs: Recent Labs  Lab 06/15/23 0936 06/16/23 0517 06/17/23 0535 06/18/23 0303  WBC 17.4* 13.7* 16.3* 15.7*   Microbiology Recent Results (from the past 240 hour(s))  Culture, blood (Routine X 2) w Reflex to ID Panel     Status: Abnormal   Collection Time: 06/10/23  3:00 AM   Specimen: BLOOD RIGHT HAND  Result Value Ref Range Status   Specimen Description   Final    BLOOD RIGHT HAND  Performed at [facility], [address] [place], [place] [address]    Special Requests   Final    BOTTLES DRAWN AEROBIC AND ANAEROBIC Blood Culture adequate volume Performed at [facility], [address], [place], [place] [address]    Culture  Setup Time   Final    GRAM POSITIVE COCCI IN CLUSTERS IN BOTH AEROBIC AND ANAEROBIC BOTTLES CRITICAL RESULT CALLED TO, READ BACK BY AND VERIFIED WITH:  Eula Flax [id number] @ 2036 FH Performed at [facility], [address], [place], [place] [address]    Culture STAPHYLOCOCCUS AUREUS (A)  Final   Report Status 06/12/2023 FINAL  Final   Organism ID, Bacteria STAPHYLOCOCCUS AUREUS  Final      Susceptibility   Staphylococcus aureus - MIC*    CIPROFLOXACIN <=0.5 SENSITIVE Sensitive     ERYTHROMYCIN >=8 RESISTANT Resistant     GENTAMICIN <=0.5 SENSITIVE Sensitive     OXACILLIN <=0.25 SENSITIVE Sensitive     TETRACYCLINE <=1  SENSITIVE Sensitive     VANCOMYCIN <=0.5 SENSITIVE Sensitive     TRIMETH/SULFA <=10 SENSITIVE Sensitive     CLINDAMYCIN RESISTANT Resistant     RIFAMPIN <=0.5 SENSITIVE Sensitive     Inducible Clindamycin POSITIVE Resistant     LINEZOLID 2 SENSITIVE Sensitive     * STAPHYLOCOCCUS AUREUS  Blood Culture ID Panel (Reflexed)     Status: Abnormal   Collection Time: 06/10/23  3:00 AM  Result Value Ref Range Status   Enterococcus faecalis NOT DETECTED NOT DETECTED Final   Enterococcus Faecium NOT DETECTED NOT DETECTED Final   Listeria monocytogenes NOT DETECTED NOT DETECTED Final   Staphylococcus species DETECTED (A) NOT DETECTED Final    Comment: CRITICAL RESULT CALLED TO, READ BACK BY AND VERIFIED WITH: PHARMD D. [id number] @ 2036 FH    Staphylococcus aureus (BCID) DETECTED (A) NOT DETECTED Final    Comment: CRITICAL RESULT CALLED TO, READ BACK BY AND VERIFIED WITH: PHARMD D. [id number] @ 2036 FH    Staphylococcus epidermidis NOT DETECTED NOT DETECTED Final   Staphylococcus lugdunensis NOT DETECTED NOT DETECTED Final   Streptococcus species NOT DETECTED NOT DETECTED Final   Streptococcus agalactiae NOT DETECTED NOT DETECTED Final   Streptococcus pneumoniae NOT DETECTED NOT DETECTED Final   Streptococcus pyogenes NOT DETECTED NOT DETECTED Final   A.calcoaceticus-baumannii NOT DETECTED NOT DETECTED Final   Bacteroides fragilis NOT DETECTED NOT DETECTED Final   Enterobacterales NOT DETECTED NOT DETECTED Final   Enterobacter cloacae complex NOT DETECTED NOT DETECTED Final   Escherichia coli NOT DETECTED NOT DETECTED Final   Klebsiella aerogenes NOT DETECTED NOT DETECTED Final   Klebsiella oxytoca NOT DETECTED NOT DETECTED Final   Klebsiella pneumoniae NOT DETECTED NOT DETECTED Final   Proteus species NOT DETECTED NOT DETECTED Final   Salmonella species NOT DETECTED NOT DETECTED Final   Serratia marcescens NOT DETECTED NOT DETECTED Final   Haemophilus influenzae NOT DETECTED NOT  DETECTED Final   Neisseria meningitidis NOT DETECTED NOT DETECTED Final   Pseudomonas aeruginosa NOT DETECTED NOT DETECTED Final   Stenotrophomonas maltophilia NOT DETECTED NOT DETECTED Final   Candida albicans NOT DETECTED NOT DETECTED Final   Candida auris NOT DETECTED NOT DETECTED Final   Candida glabrata NOT DETECTED NOT DETECTED Final   Candida krusei NOT DETECTED NOT DETECTED Final   Candida parapsilosis NOT DETECTED NOT DETECTED Final   Candida tropicalis NOT DETECTED NOT DETECTED Final   Cryptococcus neoformans/gattii NOT DETECTED NOT DETECTED  Final   Meth resistant mecA/C and MREJ NOT DETECTED NOT DETECTED Final    Comment: Performed at [facility], [address] [place], [place] [address]  Culture, blood (Routine X 2) w Reflex to ID Panel     Status: Abnormal   Collection Time: 06/10/23  6:05 AM   Specimen: BLOOD  Result Value Ref Range Status   Specimen Description   Final    BLOOD RIGHT ANTECUBITAL Performed at [facility], [address], [place], [place] [address]    Special Requests   Final    BOTTLES DRAWN AEROBIC AND ANAEROBIC Blood Culture adequate volume Performed at [facility], [address], [place], [place] [address]    Culture  Setup Time   Final    GRAM POSITIVE COCCI IN CLUSTERS IN BOTH AEROBIC AND ANAEROBIC BOTTLES CRITICAL VALUE NOTED.  VALUE IS CONSISTENT WITH PREVIOUSLY REPORTED AND CALLED VALUE.    Culture (A)  Final    STAPHYLOCOCCUS AUREUS SUSCEPTIBILITIES PERFORMED ON PREVIOUS CULTURE WITHIN THE LAST 5 DAYS. Performed at [facility], [address] [place], [place] [address]    Report Status 06/12/2023 FINAL  Final  Culture, blood (Routine X 2) w Reflex to ID Panel     Status: None   Collection Time: 06/12/23  5:35 AM   Specimen: BLOOD  Result Value Ref Range Status   Specimen Description   Final    BLOOD BLOOD LEFT ARM Performed at [facility], [address], [place], [place] [address]    Special Requests   Final    BOTTLES DRAWN AEROBIC ONLY Blood Culture adequate volume Performed at [facility], [address], [place], [place] [address]    Culture   Final    NO GROWTH 5 DAYS Performed at [facility], [address] [place] [address] [place], [place] [address]    Report Status 06/17/2023 FINAL  Final  Culture, blood (Routine X 2) w Reflex to ID Panel     Status: None   Collection Time: 06/12/23  5:35 AM   Specimen: BLOOD  Result Value Ref Range Status   Specimen Description   Final    BLOOD BLOOD LEFT HAND Performed at [facility], [address], [place], [place] [address]    Special Requests   Final    BOTTLES DRAWN AEROBIC ONLY Blood Culture adequate volume Performed at [facility], [address] [place], [place] [address]    Culture   Final    NO GROWTH 5 DAYS Performed at [facility], [address], [place], [place] [address]    Report Status 06/17/2023 FINAL  Final     Medications:    (feeding supplement) PROSource Plus  30 mL Oral BID   acetaminophen  1,000 mg Oral BID   aspirin EC  81 mg Oral Daily   clopidogrel  75 mg Oral Daily   enoxaparin (LOVENOX) injection  40 mg Subcutaneous Q24H   feeding supplement  237 mL Oral TID BM   guaiFENesin-dextromethorphan  10 mL Oral QID   labetalol  200 mg Oral BID   magnesium hydroxide  15 mL Oral Daily   methocarbamol  500 mg Oral TID   multivitamin with minerals  1 tablet Oral Daily   pantoprazole  40 mg Oral Daily   polyethylene glycol  17 g Oral Daily   predniSONE  50 mg Oral Q breakfast   rosuvastatin  20 mg Oral Daily   sodium chloride flush  10-40 mL Intracatheter Q12H   spironolactone  25 mg Oral Daily   tamsulosin  0.4 mg Oral Daily   Continuous Infusions:   ceFAZolin (ANCEF) IV 2 g (06/18/23 0259)      LOS: 8 days   Marinda Elk  [facility]  06/18/2023, 9:48 AM

## 2023-06-30 ENCOUNTER — Inpatient Hospital Stay: Payer: Medicare (Managed Care) | Admitting: Internal Medicine

## 2023-07-06 ENCOUNTER — Ambulatory Visit (HOSPITAL_COMMUNITY)
Admission: RE | Admit: 2023-07-06 | Discharge: 2023-07-06 | Disposition: A | Discharge status: Home / Self Care | Payer: Medicare (Managed Care) | Source: Ambulatory Visit | Attending: Vascular Surgery | Admitting: Vascular Surgery

## 2023-07-06 ENCOUNTER — Other Ambulatory Visit: Payer: Self-pay | Admitting: Vascular Surgery

## 2023-07-06 DIAGNOSIS — K869 Disease of pancreas, unspecified: Secondary | ICD-10-CM | POA: Diagnosis present

## 2023-07-06 MED ORDER — GADOBUTROL 1 MMOL/ML IV SOLN
6.0000 mL | Freq: Once | INTRAVENOUS | Status: AC | PRN
  Administered 2023-07-06: 6 mL via INTRAVENOUS

## 2023-07-17 ENCOUNTER — Encounter (HOSPITAL_COMMUNITY): Payer: Self-pay | Admitting: Vascular Surgery

## 2023-07-23 ENCOUNTER — Telehealth: Payer: Self-pay

## 2023-07-23 ENCOUNTER — Inpatient Hospital Stay: Payer: Medicare (Managed Care) | Admitting: Internal Medicine

## 2023-07-23 NOTE — Telephone Encounter (Signed)
 Called patient regarding missed appointment today. Spoke with patient's daughter who states pt is currently at [facility]. Was not sure why patient missed appointment, but would like call back once appt is rescheduled. Spoke with staff at SNF who states pt called this morning to cancel appointment. Was not aware that he was scheduled for follow up. Staff was able to reschedule appt for next week with Dr. Overton. Called patient daughter and updated her with appt information. [name] Code, RMA

## 2023-07-28 ENCOUNTER — Telehealth: Payer: Self-pay

## 2023-07-28 NOTE — Telephone Encounter (Signed)
 Tonye Becket, NP @ [facility] called to clarify patient will continue Plavix & ASA because she had noted a discrepancy where 3 months was stated and wanted to be sure.   -Confirmed pt is to continue anticoagulants.

## 2023-07-29 ENCOUNTER — Telehealth: Payer: Self-pay

## 2023-07-29 ENCOUNTER — Inpatient Hospital Stay: Payer: Medicare (Managed Care) | Admitting: Internal Medicine

## 2023-07-29 NOTE — Telephone Encounter (Signed)
 Called [facility] to reschedule patient appointment. They stated that patient appointment needs to rescheduled with [facility]. I called them twice and was hung upon both times had front office staff call and she had to leave a message for the scheduler Camilo Cella.

## 2023-07-30 ENCOUNTER — Ambulatory Visit: Payer: Medicare (Managed Care) | Admitting: Physician Assistant

## 2023-07-30 ENCOUNTER — Encounter: Payer: Self-pay | Admitting: Physician Assistant

## 2023-07-30 ENCOUNTER — Other Ambulatory Visit (INDEPENDENT_AMBULATORY_CARE_PROVIDER_SITE_OTHER): Payer: Medicare (Managed Care)

## 2023-07-30 DIAGNOSIS — M25511 Pain in right shoulder: Secondary | ICD-10-CM

## 2023-07-30 NOTE — Progress Notes (Signed)
  HPI: Patient 78 year old male who comes in today with right shoulder pain that it has been ongoing since he fell into his motorized wheelchair in October or November of this year.  Patient's past medical history pertinent for history of cervical fusion and also a history of stroke with residual defect on the left, coronary artery disease, renal artery stenosis, carotid stenosis diastolic heart failure and lumbar compression fracture.  States he has 8 out of 10 pain in the right shoulder at times.  He notes he has had decreased range of motion and strength in the right arm since this fall in October and November of this past year.  He had no treatment for this.  Due to his stroke he has virtually no use of his left arm.  Patient states that since his lumbar fracture he has not ambulated any distance and relies on a motorized wheelchair.  Review of systems: See HPI otherwise negative  Physical exam: General Well-developed well-nourished male seated in a wheelchair.  No acute distress. Respirations: Unlabored Upper extremity: Left arm contracture of the left hand and elbow.  Unable to test strength on the left.  Right shoulder he has 3 out of 5 strength with external and internal rotation against resistance.  Empty can test is positive.  Unable to perform liftoff test on the right.  Forward flexion right arm to approximately 160 degrees passively.  Good range of motion of the right elbow without pain.  Radiographs: 3 views right shoulder shows high riding shoulder consistent with arthropathy.  Shoulders are otherwise located.  No acute fractures acute findings.  Impression: Right shoulder arthropathy  Plan: Discussed with patient treatments and do not feel therapy would be very beneficial for him at this point in time given his radiographs.  Recommend intra-articular injection of the shoulder with steroid versus possible right shoulder arthroplasty.  My concerns with the arthroplasty is his overall  health.  He would be a surgical candidate.  He and his family would like to discuss treatment options and let us know how they would like to proceed.  Questions were encouraged and answered at length.

## 2023-08-31 ENCOUNTER — Other Ambulatory Visit: Payer: Medicare (Managed Care)

## 2023-09-03 ENCOUNTER — Ambulatory Visit: Payer: Medicare (Managed Care) | Admitting: "Endocrinology

## 2023-09-07 ENCOUNTER — Other Ambulatory Visit: Payer: Medicare (Managed Care)

## 2023-09-09 ENCOUNTER — Ambulatory Visit: Payer: Medicare (Managed Care) | Admitting: "Endocrinology

## 2023-10-29 ENCOUNTER — Other Ambulatory Visit: Payer: Medicare (Managed Care)

## 2023-11-05 ENCOUNTER — Ambulatory Visit: Payer: Medicare (Managed Care) | Admitting: "Endocrinology

## 2023-12-01 ENCOUNTER — Ambulatory Visit: Payer: Medicare (Managed Care) | Attending: Vascular Surgery | Admitting: Physician Assistant

## 2023-12-01 ENCOUNTER — Ambulatory Visit (HOSPITAL_COMMUNITY)
Admission: RE | Admit: 2023-12-01 | Discharge: 2023-12-01 | Disposition: A | Discharge status: Home / Self Care | Payer: Medicare (Managed Care) | Source: Ambulatory Visit | Attending: Vascular Surgery | Admitting: Vascular Surgery

## 2023-12-01 ENCOUNTER — Ambulatory Visit (HOSPITAL_BASED_OUTPATIENT_CLINIC_OR_DEPARTMENT_OTHER)
Admission: RE | Admit: 2023-12-01 | Discharge: 2023-12-01 | Disposition: A | Discharge status: Home / Self Care | Payer: Medicare (Managed Care) | Source: Ambulatory Visit | Attending: Vascular Surgery | Admitting: Vascular Surgery

## 2023-12-01 VITALS — BP 132/79 | HR 96 | Temp 98.3°F | Resp 18

## 2023-12-01 DIAGNOSIS — I739 Peripheral vascular disease, unspecified: Secondary | ICD-10-CM | POA: Diagnosis present

## 2023-12-01 LAB — VAS US ABI WITH/WO TBI
Left ABI: 1.18
Right ABI: 0.64

## 2023-12-02 ENCOUNTER — Encounter: Payer: Self-pay | Admitting: Physician Assistant

## 2023-12-02 NOTE — Progress Notes (Signed)
 Office Note     CC:  follow up Requesting Provider:  [facility]  HPI: Howard Jackson is a 78 y.o. (01-May-1946) male who presents for surveillance of PAD.  He has history of angioplasty and stenting of the left SFA and popliteal artery with angioplasty of the left PT by Dr. Fulton Job on 04/23/2023 due to left second toe ulceration.  He has history of a stroke affecting the left side of his body.  He is wheelchair-bound.  He believes the left second toe is well-healed.  He has not followed up with his podiatrist Dr. Luster Salters within the last year.  He is on aspirin  and Plavix  daily.  He is a nursing home resident.   Past Medical History:  Diagnosis Date   Adrenal gland disorder (HCC)    tumor present - no change-  no recent increase     Arthritis    Benign pheochromocytoma of right adrenal gland 06/23/2014   Never had histological diagnosis, but reportedly initially diagnosed in 1989   Bunion    left foot   Complication of anesthesia    "died on the table during cervical fusion" '89 - believed to be d/t "too much medication" to treat HTN and was later dx with pheochromocytoma   Coronary artery disease    cleared for surg. by Dr. Katheryne Pane - [facility]   H/O hiatal hernia    Hammer toe    Herniated lumbar intervertebral disc    History of anxiety    Hyperlipidemia    Hypertension    Neurogenic bladder    Neuromuscular disorder (HCC)    carpal tunnel problem, even after surgery release    Peripheral arterial disease (HCC)    Primary aldosteronism (HCC)    Stroke (HCC)    in [place]- 1999, L sided weakness, wears a brace on L leg & uses cane    Past Surgical History:  Procedure Laterality Date   ABDOMINAL AORTOGRAM W/LOWER EXTREMITY N/A 04/23/2023   Procedure: ABDOMINAL AORTOGRAM W/LOWER EXTREMITY;  Surgeon: Young Hensen, MD;  Location: [facility];  Service: Cardiovascular;  Laterality: N/A;   ANTERIOR APPROACH HEMI HIP ARTHROPLASTY Left 04/15/2018   Procedure:  ANTERIOR APPROACH HEMI HIP ARTHROPLASTY;  Surgeon: Adonica Hoose, MD;  Location: [facility] OR;  Service: Orthopedics;  Laterality: Left;   CARPAL TUNNEL RELEASE     bilateral    CERVICAL FUSION     CORONARY ANGIOPLASTY WITH STENT PLACEMENT     pt reports having 5 stents in his heart-2011   HERNIA REPAIR     INGUINAL HERNIA REPAIR Left 11/18/2012   Procedure: HERNIA REPAIR INGUINAL ADULT;  Surgeon: Rogena Class, MD;  Location: [facility] OR;  Service: General;  Laterality: Left;   INSERTION OF MESH Left 11/18/2012   Procedure: INSERTION OF MESH;  Surgeon: Rogena Class, MD;  Location: [facility] OR;  Service: General;  Laterality: Left;   IR KYPHO LUMBAR INC FX REDUCE BONE BX UNI/BIL CANNULATION INC/IMAGING  02/20/2023   PERIPHERAL VASCULAR INTERVENTION  04/23/2023   Procedure: PERIPHERAL VASCULAR INTERVENTION;  Surgeon: Young Hensen, MD;  Location: [facility];  Service: Cardiovascular;;   TRANSESOPHAGEAL ECHOCARDIOGRAM (CATH LAB) N/A 06/15/2023   Procedure: TRANSESOPHAGEAL ECHOCARDIOGRAM;  Surgeon: Luana Rumple, MD;  Location: [facility];  Service: Cardiovascular;  Laterality: N/A;    Social History   Socioeconomic History   Marital status: Legally Separated    Spouse name: Not on file   Number of children:  Not on file   Years of education: Not on file   Highest education level: Not on file  Occupational History   Not on file  Tobacco Use   Smoking status: Never   Smokeless tobacco: Never  Vaping Use   Vaping status: Never Used  Substance and Sexual Activity   Alcohol use: Yes    Comment: social - one drink, on special occasion    Drug use: No   Sexual activity: Not on file  Other Topics Concern   Not on file  Social History Narrative   Not on file   Social Drivers of Health   Financial Resource Strain: Not on file  Food Insecurity: No Food Insecurity (06/10/2023)   Hunger Vital Sign    Worried About Running Out of Food in the Last Year: Never true    Ran  Out of Food in the Last Year: Never true  Transportation Needs: No Transportation Needs (06/10/2023)   PRAPARE - Administrator, Civil Service (Medical): No    Lack of Transportation (Non-Medical): No  Physical Activity: Not on file  Stress: Not on file  Social Connections: Unknown (11/25/2021)   [facility], [facility]   Social Network    Social Network: Not on file  Intimate Partner Violence: Not At Risk (06/10/2023)   Humiliation, Afraid, Rape, and Kick questionnaire    Fear of Current or Ex-Partner: No    Emotionally Abused: No    Physically Abused: No    Sexually Abused: No    Family History  Problem Relation Age of Onset   CVA Father    Diabetes Father    Alzheimer's disease Father    Hypertension Sister    Hypertension Brother    CVA Paternal Grandmother    Hypertension Brother    Hypertension Mother     Current Outpatient Medications  Medication Sig Dispense Refill   acetaminophen  (TYLENOL ) 500 MG tablet Take 1,000 mg by mouth every 6 (six) hours as needed for moderate pain (pain score 4-6) or mild pain (pain score 1-3).     aspirin  EC 81 MG tablet Take 1 tablet (81 mg total) by mouth daily. Swallow whole. 30 tablet 12   clopidogrel  (PLAVIX ) 75 MG tablet Take 1 tablet (75 mg total) by mouth daily. 90 tablet 3   Coenzyme Q10 (CO Q 10) 100 MG CAPS Take 100 mg by mouth daily.     finasteride  (PROSCAR ) 5 MG tablet Take 5 mg by mouth daily. For prostate     fluticasone  (FLOVENT  HFA) 44 MCG/ACT inhaler Inhale 2 puffs into the lungs 2 (two) times daily.     Fluticasone  Furoate (ARNUITY ELLIPTA) 200 MCG/ACT AEPB Inhale 1 puff into the lungs daily. Rinse mouth after use     labetalol  (NORMODYNE ) 300 MG tablet Take 300 mg by mouth 2 (two) times daily. For blood pressure     Multiple Vitamins-Minerals (CERTAVITE SENIOR PO) Take 1 tablet by mouth daily.     NIFEdipine  (ADALAT  CC) 30 MG 24 hr tablet Take 30 mg by mouth in the morning.     NIFEdipine   (ADALAT  CC) 60 MG 24 hr tablet Take 1 tablet (60 mg total) by mouth every evening. 90 tablet 3   Nutritional Supplements (,FEEDING SUPPLEMENT, PROSOURCE PLUS) liquid Take 30 mLs by mouth 2 (two) times daily between meals. 887 mL 0   pantoprazole  (PROTONIX ) 40 MG tablet Take 1 tablet (40 mg total) by mouth daily. Take along with prednisone  60  tablet 1   predniSONE  (DELTASONE ) 20 MG tablet Take 2 tablets (40 mg total) by mouth daily with breakfast. For inflammation     Probiotic Product (PROBIOTIC DAILY PO) Take 500 mg by mouth in the morning and at bedtime. Gericare Probiotic capsules     protein supplement (PROSOURCE NO CARB) LIQD Take 30 mLs by mouth 2 (two) times daily.     rosuvastatin  (CRESTOR ) 20 MG tablet TOME UNA TABLETA TODOS LOS DIAS (Patient taking differently: Take 20 mg by mouth daily.) 90 tablet 3   sennosides-docusate sodium  (SENOKOT-S) 8.6-50 MG tablet Take 2 tablets by mouth in the morning and at bedtime.     spironolactone  (ALDACTONE ) 50 MG tablet Take 1 tablet (50 mg total) by mouth daily. (Patient taking differently: Take 50 mg by mouth in the morning.) 30 tablet 0   tamsulosin  (FLOMAX ) 0.4 MG CAPS capsule Take 1 capsule (0.4 mg total) by mouth daily. (Patient taking differently: Take 0.4 mg by mouth in the morning and at bedtime.) 30 capsule 3   tiZANidine  (ZANAFLEX ) 2 MG tablet Take 1 tablet (2 mg total) by mouth daily as needed for muscle spasms. 30 tablet 0   triamcinolone  ointment (KENALOG ) 0.1 % Apply 1 Application topically 2 (two) times daily as needed (for dryness).     Zinc  Oxide 40 % PSTE Apply 1 Application topically as needed for irritation (Skin protection).     No current facility-administered medications for this visit.    Allergies  Allergen Reactions   Amitriptyline Hcl Hypertension   Other Shortness Of Breath and Other (See Comments)    SKIN is sensitive- Guards for the underwear MUST be removed immediately after using  Anesthesia- "They almost could not  get me back" (HAS TO TAKE SPIRONOLACTONE  NOW)   Losartan  Potassium Other (See Comments)    Hypotension   Papaya Derivatives Itching and Other (See Comments)    Itching of the throat   Sulfa  Antibiotics Other (See Comments)    Constipation      REVIEW OF SYSTEMS:   [X]  denotes positive finding, [ ]  denotes negative finding Cardiac  Comments:  Chest pain or chest pressure:    Shortness of breath upon exertion:    Short of breath when lying flat:    Irregular heart rhythm:        Vascular    Pain in calf, thigh, or hip brought on by ambulation:    Pain in feet at night that wakes you up from your sleep:     Blood clot in your veins:    Leg swelling:         Pulmonary    Oxygen at home:    Productive cough:     Wheezing:         Neurologic    Sudden weakness in arms or legs:     Sudden numbness in arms or legs:     Sudden onset of difficulty speaking or slurred speech:    Temporary loss of vision in one eye:     Problems with dizziness:         Gastrointestinal    Blood in stool:     Vomited blood:         Genitourinary    Burning when urinating:     Blood in urine:        Psychiatric    Major depression:         Hematologic    Bleeding problems:    Problems with blood clotting  too easily:        Skin    Rashes or ulcers:        Constitutional    Fever or chills:      PHYSICAL EXAMINATION:  Vitals:   12/01/23 1345  BP: 132/79  Pulse: 96  Resp: 18  Temp: 98.3 F (36.8 C)  TempSrc: Temporal  SpO2: 96%    General:  WDWN in NAD; vital signs documented above Gait: Not observed HENT: WNL, normocephalic Pulmonary: normal non-labored breathing , without Rales, rhonchi,  wheezing Cardiac: regular HR Abdomen: soft, NT, no masses Skin: without rashes Vascular Exam/Pulses: Left PT and DP brisk by Doppler Extremities: without ischemic changes, without Gangrene , without cellulitis; without open wounds; discolored left second toe however no open wounds;  severe hammertoe deformity of the left second toe Musculoskeletal: no muscle wasting or atrophy  Neurologic: A&O X 3 Psychiatric:  The pt has Normal affect.    Non-Invasive Vascular Imaging:   Left SFA and popliteal stent patent with 326 cm/s outflow  ABI/TBIToday's ABIToday's TBIPrevious ABIPrevious TBI  +-------+-----------+-----------+------------+------------+  Right 0.64       0.35       0.43        0             +-------+-----------+-----------+------------+------------+  Left  1.18       0.51       1.05        0.41          +-------+-----------+-----------+------------+-------     ASSESSMENT/PLAN:: 78 y.o. male here for follow up for surveillance of PAD with history of left SFA and popliteal stenting due to left second toe ulcer  Overall Howard Jackson is doing well.  He is a nursing home resident and is wheelchair-bound.  His left second toe is discolored and has a severe hammertoe deformity however there are no open wounds.  He does have an elevated velocity at the outflow of the popliteal stent however given that he is without rest pain and has healed the second toe we will continue conservatively.  He will continue his aspirin  and Plavix  daily.  I will refer him back to podiatry as he has not seen them in the past year.  We will repeat left lower extremity arterial duplex and ABI in 6 months.  He will notify the office sooner if he develops any tissue loss or rest pain.   Cordie Deters, PA-C [facility] [phone]  Clinic MD:   Fulton Job

## 2023-12-03 ENCOUNTER — Other Ambulatory Visit: Payer: Self-pay | Admitting: *Deleted

## 2023-12-03 DIAGNOSIS — I739 Peripheral vascular disease, unspecified: Secondary | ICD-10-CM

## 2023-12-03 DIAGNOSIS — I70222 Atherosclerosis of native arteries of extremities with rest pain, left leg: Secondary | ICD-10-CM

## 2023-12-21 ENCOUNTER — Ambulatory Visit: Payer: Medicare (Managed Care) | Admitting: Podiatry

## 2024-01-12 ENCOUNTER — Ambulatory Visit (INDEPENDENT_AMBULATORY_CARE_PROVIDER_SITE_OTHER): Payer: Medicare (Managed Care) | Admitting: "Endocrinology

## 2024-01-12 ENCOUNTER — Encounter: Payer: Self-pay | Admitting: "Endocrinology

## 2024-01-12 VITALS — BP 110/80 | HR 103 | Ht 67.0 in

## 2024-01-12 DIAGNOSIS — Z86018 Personal history of other benign neoplasm: Secondary | ICD-10-CM | POA: Diagnosis not present

## 2024-01-12 DIAGNOSIS — Z8639 Personal history of other endocrine, nutritional and metabolic disease: Secondary | ICD-10-CM

## 2024-01-12 NOTE — Progress Notes (Signed)
 Outpatient Endocrinology Note Howard Birmingham, MD    Howard Jackson [facility] 05-10-1946 [id number]  Referring Provider: [facility] Primary Care Provider: Cloria Annabella CROME, DO Reason for consultation: Subjective   Assessment & Plan  Diagnoses and all orders for this visit:  History of pheochromocytoma -     DHEA-sulfate -     ACTH  -     Cortisol -     Metanephrines, plasma -     Aldosterone + renin activity w/ ratio -     Basic Metabolic Panel (BMET)  History of hyperaldosteronism -     DHEA-sulfate -     ACTH  -     Cortisol -     Metanephrines, plasma -     Aldosterone + renin activity w/ ratio -     Basic Metabolic Panel (BMET)    Per records, patient has a history of benign pheochromocytoma of the right adrenal gland diagnosed in 1989 without histologic diagnosis as well as history of primary hyperaldosteronism. Other conditions include hyponatremia, hypertension, chronic kidney and heart disease, stroke with left spastic hemiparesis, now wheelchair-bound, dementia etc. For his primary hyperaldosteronism he was previously on Aldactone  100 mg daily and the dose was decreased to 50 mg every day due to mild hyperkalemia. For his blood pressure he is on labetalol  300 mg twice daily 04/2015 note reported  24-hour urine for metanephrines and chemical means was essentially normal and only elevated lab was the metanephrines 532 which was just above the normal range. His endocrinologist didn't recommend a follow-up 07/18/2023 MRI ABDOMEN WITHOUT AND WITH CONTRAST (INCLUDING MRCP) reported Adrenal glands are unremarkable  Ordered baseline labs Discussed with radiologist to re-assess the adrenals in 06/2023 MRI to r/o adrenal mass   Return in about 3 months (around 04/13/2024) for visit, labs today.   I have reviewed current medications, nurse's notes, allergies, vital signs, past medical and surgical history, family medical history, and social history for this  encounter. Counseled patient on symptoms, examination findings, lab findings, imaging results, treatment decisions and monitoring and prognosis. The patient understood the recommendations and agrees with the treatment plan. All questions regarding treatment plan were fully answered.  Howard Birmingham, MD  01/12/24   History of Present Illness HPI  Howard Jackson is a 78 y.o. year old male who presents for evaluation of benign pheochromocytoma of the right adrenal gland diagnosed in 1989 without histologic diagnosis as well as history of primary hyperaldosteronism.  Current regimen: Spironolactone  50 mg every day (decreased from 100 mg every day due to mild hyperkalemia).  He weight change Yes moon face No fat pads No increased girth No plethora No hyperpigmentation No purple striae No proximal muscle weakness Yes, post stroke wheel chair bound  acne No scalp hair loss Yes a history of HTN a history of hypokalemia No paroxysmal episodes of anxiety Yes, sometimes  tremors No lightheadedness No headache Yes, very seldom  palpitation No sweating No blurry vision No   05/2023 labs: GFR more than 60  Physical Exam  BP 110/80   Pulse (!) 103   Ht 5' 7 (1.702 m)   SpO2 96%   BMI 22.41 kg/m    No cushingoid features (no purple striae/fat pad except left supraclavicular fullness) Constitutional: well developed, well nourished Head: normocephalic, atraumatic Eyes: sclera anicteric, no redness Neck: supple Lungs: normal respiratory effort Neurology: alert and oriented Skin: dry, no appreciable rashes Musculoskeletal: no appreciable defects Psychiatric: normal mood and affect  Current Medications Patient's Medications  New Prescriptions   No medications on file  Previous Medications   ACETAMINOPHEN  (TYLENOL ) 500 MG TABLET    Take 1,000 mg by mouth every 6 (six) hours as needed for moderate pain (pain score 4-6) or mild pain (pain score 1-3).   ASPIRIN  EC 81 MG  TABLET    Take 1 tablet (81 mg total) by mouth daily. Swallow whole.   CLOPIDOGREL  (PLAVIX ) 75 MG TABLET    Take 1 tablet (75 mg total) by mouth daily.   COENZYME Q10 (CO Q 10) 100 MG CAPS    Take 100 mg by mouth daily.   FINASTERIDE  (PROSCAR ) 5 MG TABLET    Take 5 mg by mouth daily. For prostate   FLUTICASONE  (FLOVENT  HFA) 44 MCG/ACT INHALER    Inhale 2 puffs into the lungs 2 (two) times daily.   FLUTICASONE  FUROATE (ARNUITY ELLIPTA) 200 MCG/ACT AEPB    Inhale 1 puff into the lungs daily. Rinse mouth after use   LABETALOL  (NORMODYNE ) 300 MG TABLET    Take 300 mg by mouth 2 (two) times daily. For blood pressure   MULTIPLE VITAMINS-MINERALS (CERTAVITE SENIOR PO)    Take 1 tablet by mouth daily.   NIFEDIPINE  (ADALAT  CC) 30 MG 24 HR TABLET    Take 30 mg by mouth in the morning.   NIFEDIPINE  (ADALAT  CC) 60 MG 24 HR TABLET    Take 1 tablet (60 mg total) by mouth every evening.   NUTRITIONAL SUPPLEMENTS (,FEEDING SUPPLEMENT, PROSOURCE PLUS) LIQUID    Take 30 mLs by mouth 2 (two) times daily between meals.   PANTOPRAZOLE  (PROTONIX ) 40 MG TABLET    Take 1 tablet (40 mg total) by mouth daily. Take along with prednisone    PREDNISONE  (DELTASONE ) 20 MG TABLET    Take 2 tablets (40 mg total) by mouth daily with breakfast. For inflammation   PROBIOTIC PRODUCT (PROBIOTIC DAILY PO)    Take 500 mg by mouth in the morning and at bedtime. Gericare Probiotic capsules   PROTEIN SUPPLEMENT (PROSOURCE NO CARB) LIQD    Take 30 mLs by mouth 2 (two) times daily.   ROSUVASTATIN  (CRESTOR ) 20 MG TABLET    TOME UNA TABLETA TODOS LOS DIAS   SENNOSIDES-DOCUSATE SODIUM  (SENOKOT-S) 8.6-50 MG TABLET    Take 2 tablets by mouth in the morning and at bedtime.   SPIRONOLACTONE  (ALDACTONE ) 50 MG TABLET    Take 1 tablet (50 mg total) by mouth daily.   TAMSULOSIN  (FLOMAX ) 0.4 MG CAPS CAPSULE    Take 1 capsule (0.4 mg total) by mouth daily.   TIZANIDINE  (ZANAFLEX ) 2 MG TABLET    Take 1 tablet (2 mg total) by mouth daily as needed for  muscle spasms.   TRIAMCINOLONE  OINTMENT (KENALOG ) 0.1 %    Apply 1 Application topically 2 (two) times daily as needed (for dryness).   ZINC  OXIDE 40 % PSTE    Apply 1 Application topically as needed for irritation (Skin protection).  Modified Medications   No medications on file  Discontinued Medications   No medications on file    Allergies Allergies  Allergen Reactions   Amitriptyline Hcl Hypertension   Other Shortness Of Breath and Other (See Comments)    SKIN is sensitive- Guards for the underwear MUST be removed immediately after using  Anesthesia- They almost could not get me back (HAS TO TAKE SPIRONOLACTONE  NOW)   Losartan  Potassium Other (See Comments)    Hypotension   Papaya Derivatives Itching and Other (See  Comments)    Itching of the throat   Sulfa  Antibiotics Other (See Comments)    Constipation     Past Medical History Past Medical History:  Diagnosis Date   Adrenal gland disorder (HCC)    tumor present - no change-  no recent increase     Arthritis    Benign pheochromocytoma of right adrenal gland 06/23/2014   Never had histological diagnosis, but reportedly initially diagnosed in 1989   Bunion    left foot   Complication of anesthesia    died on the table during cervical fusion '89 - believed to be d/t too much medication to treat HTN and was later dx with pheochromocytoma   Coronary artery disease    cleared for surg. by Dr. Court - [facility]   H/O hiatal hernia    Hammer toe    Herniated lumbar intervertebral disc    History of anxiety    Hyperlipidemia    Hypertension    Neurogenic bladder    Neuromuscular disorder (HCC)    carpal tunnel problem, even after surgery release    Peripheral arterial disease (HCC)    Primary aldosteronism (HCC)    Stroke (HCC)    in [place]- [place], L sided weakness, wears a brace on L leg & uses cane    Past Surgical History Past Surgical History:  Procedure Laterality Date   ABDOMINAL AORTOGRAM W/LOWER EXTREMITY N/A  04/23/2023   Procedure: ABDOMINAL AORTOGRAM W/LOWER EXTREMITY;  Surgeon: Gretta Lonni PARAS, MD;  Location: [facility];  Service: Cardiovascular;  Laterality: N/A;   ANTERIOR APPROACH HEMI HIP ARTHROPLASTY Left 04/15/2018   Procedure: ANTERIOR APPROACH HEMI HIP ARTHROPLASTY;  Surgeon: Fidel Rogue, MD;  Location: [facility] OR;  Service: Orthopedics;  Laterality: Left;   CARPAL TUNNEL RELEASE     bilateral    CERVICAL FUSION     CORONARY ANGIOPLASTY WITH STENT PLACEMENT     pt reports having 5 stents in his heart-2011   HERNIA REPAIR     INGUINAL HERNIA REPAIR Left 11/18/2012   Procedure: HERNIA REPAIR INGUINAL ADULT;  Surgeon: Vicenta DELENA Poli, MD;  Location: [facility] OR;  Service: General;  Laterality: Left;   INSERTION OF MESH Left 11/18/2012   Procedure: INSERTION OF MESH;  Surgeon: Vicenta DELENA Poli, MD;  Location: [facility] OR;  Service: General;  Laterality: Left;   IR KYPHO LUMBAR INC FX REDUCE BONE BX UNI/BIL CANNULATION INC/IMAGING  02/20/2023   PERIPHERAL VASCULAR INTERVENTION  04/23/2023   Procedure: PERIPHERAL VASCULAR INTERVENTION;  Surgeon: Gretta Lonni PARAS, MD;  Location: [facility];  Service: Cardiovascular;;   TRANSESOPHAGEAL ECHOCARDIOGRAM (CATH LAB) N/A 06/15/2023   Procedure: TRANSESOPHAGEAL ECHOCARDIOGRAM;  Surgeon: Francyne Headland, MD;  Location: [facility];  Service: Cardiovascular;  Laterality: N/A;    Family History family history includes Alzheimer's disease in his father; CVA in his father and paternal grandmother; Diabetes in his father; Hypertension in his brother, brother, mother, and sister.  Social History Social History   Socioeconomic History   Marital status: Legally Separated    Spouse name: Not on file   Number of children: Not on file   Years of education: Not on file   Highest education level: Not on file  Occupational History   Not on file  Tobacco Use   Smoking status: Never   Smokeless tobacco: Never  Vaping Use   Vaping status:  Never Used  Substance and Sexual Activity   Alcohol use: Yes    Comment: social -  one drink, on special occasion    Drug use: No   Sexual activity: Not on file  Other Topics Concern   Not on file  Social History Narrative   Not on file   Social Drivers of Health   Financial Resource Strain: Not on file  Food Insecurity: No Food Insecurity (06/10/2023)   Hunger Vital Sign    Worried About Running Out of Food in the Last Year: Never true    Ran Out of Food in the Last Year: Never true  Transportation Needs: No Transportation Needs (06/10/2023)   PRAPARE - Administrator, Civil Service (Medical): No    Lack of Transportation (Non-Medical): No  Physical Activity: Not on file  Stress: Not on file  Social Connections: Unknown (11/25/2021)   [facility] from [facility]   Social Network    Social Network: Not on file  Intimate Partner Violence: Not At Risk (06/10/2023)   Humiliation, Afraid, Rape, and Kick questionnaire    Fear of Current or Ex-Partner: No    Emotionally Abused: No    Physically Abused: No    Sexually Abused: No    Lab Results  Component Value Date   CHOL 156 02/03/2020   Lab Results  Component Value Date   HDL 49 02/03/2020   Lab Results  Component Value Date   LDLCALC 96 02/03/2020   Lab Results  Component Value Date   TRIG 52 02/03/2020   Lab Results  Component Value Date   CHOLHDL 3.2 02/03/2020   Lab Results  Component Value Date   CREATININE 0.70 06/18/2023   No results found for: GFR    Component Value Date/Time   NA 128 (L) 06/18/2023 0303   NA 136 12/16/2019 1502   K 4.3 06/18/2023 0303   CL 93 (L) 06/18/2023 0303   CO2 28 06/18/2023 0303   GLUCOSE 210 (H) 06/18/2023 0303   BUN 42 (H) 06/18/2023 0303   BUN 19 12/16/2019 1502   CREATININE 0.70 06/18/2023 0303   CREATININE 1.13 11/25/2016 1410   CALCIUM  7.7 (L) 06/18/2023 0303   PROT 5.4 (L) 06/10/2023 0605   ALBUMIN 2.5 (L) 06/10/2023 0605   AST 64 (H) 06/10/2023  0605   ALT 66 (H) 06/10/2023 0605   ALKPHOS 58 06/10/2023 0605   BILITOT 1.0 06/10/2023 0605   GFRNONAA >60 06/18/2023 0303   GFRNONAA 64 11/25/2016 1410   GFRAA 64 12/16/2019 1502   GFRAA 74 11/25/2016 1410      Latest Ref Rng & Units 06/18/2023    3:03 AM 06/17/2023    5:35 AM 06/16/2023    5:17 AM  BMP  Glucose 70 - 99 mg/dL 789  778  830   BUN 8 - 23 mg/dL 42  40  34   Creatinine 0.61 - 1.24 mg/dL 9.29  9.31  9.52   Sodium 135 - 145 mmol/L 128  128  125   Potassium 3.5 - 5.1 mmol/L 4.3  3.9  4.2   Chloride 98 - 111 mmol/L 93  93  90   CO2 22 - 32 mmol/L 28  26  26    Calcium  8.9 - 10.3 mg/dL 7.7  8.0  8.4        Component Value Date/Time   WBC 15.7 (H) 06/18/2023 0303   RBC 3.39 (L) 06/18/2023 0303   HGB 11.3 (L) 06/18/2023 0303   HGB 15.5 12/16/2019 1502   HCT 34.7 (L) 06/18/2023 0303   HCT 44.6 12/16/2019 1502  PLT 227 06/18/2023 0303   PLT 193 12/16/2019 1502   MCV 102.4 (H) 06/18/2023 0303   MCV 96 12/16/2019 1502   MCH 33.3 06/18/2023 0303   MCHC 32.6 06/18/2023 0303   RDW 14.5 06/18/2023 0303   RDW 12.7 12/16/2019 1502   LYMPHSABS 0.7 06/09/2023 1230   MONOABS 1.7 (H) 06/09/2023 1230   EOSABS 0.0 06/09/2023 1230   BASOSABS 0.1 06/09/2023 1230   Lab Results  Component Value Date   TSH 2.266 12/17/2022   TSH  10/18/2009    1.531 (NOTE) Please note change in reference ranges for ages 84W to 31Y. Test methodology is 3rd generation TSH         Parts of this note may have been dictated using voice recognition software. There may be variances in spelling and vocabulary which are unintentional. Not all errors are proofread. Please notify the dino if any discrepancies are noted or if the meaning of any statement is not clear.

## 2024-01-16 LAB — METANEPHRINES, PLASMA
Metanephrine, Free: 63 pg/mL — ABNORMAL HIGH (ref <=57)
Normetanephrine, Free: 145 pg/mL (ref <=148)
Total Metanephrines-Plasma: 208 pg/mL — ABNORMAL HIGH (ref <=205)

## 2024-01-16 LAB — ALDOSTERONE + RENIN ACTIVITY W/ RATIO
ALDO / PRA Ratio: 1.3 ratio (ref 0.9–28.9)
Aldosterone: 16 ng/dL
Renin Activity: 12.51 ng/mL/h — ABNORMAL HIGH (ref 0.25–5.82)

## 2024-01-16 LAB — BASIC METABOLIC PANEL WITH GFR
BUN/Creatinine Ratio: 32 (calc) — ABNORMAL HIGH (ref 6–22)
BUN: 26 mg/dL — ABNORMAL HIGH (ref 7–25)
CO2: 28 mmol/L (ref 20–32)
Calcium: 9.7 mg/dL (ref 8.6–10.3)
Chloride: 98 mmol/L (ref 98–110)
Creat: 0.82 mg/dL (ref 0.70–1.22)
Glucose, Bld: 99 mg/dL (ref 65–139)
Potassium: 4.5 mmol/L (ref 3.5–5.3)
Sodium: 134 mmol/L — ABNORMAL LOW (ref 135–146)
eGFR: 89 mL/min/1.73m2 (ref >=60)

## 2024-01-16 LAB — CORTISOL: Cortisol, Plasma: 11.2 ug/dL

## 2024-01-16 LAB — DHEA-SULFATE: DHEA-SO4: 21 ug/dL (ref 3–225)

## 2024-01-16 LAB — ACTH: C206 ACTH: 13 pg/mL (ref 6–50)

## 2024-02-08 ENCOUNTER — Ambulatory Visit (INDEPENDENT_AMBULATORY_CARE_PROVIDER_SITE_OTHER): Payer: Medicare (Managed Care) | Admitting: Podiatry

## 2024-02-08 ENCOUNTER — Encounter: Payer: Self-pay | Admitting: Podiatry

## 2024-02-08 ENCOUNTER — Ambulatory Visit (INDEPENDENT_AMBULATORY_CARE_PROVIDER_SITE_OTHER): Payer: Medicare (Managed Care)

## 2024-02-08 VITALS — Ht 67.0 in | Wt 143.1 lb

## 2024-02-08 DIAGNOSIS — M2042 Other hammer toe(s) (acquired), left foot: Secondary | ICD-10-CM

## 2024-02-08 DIAGNOSIS — M79674 Pain in right toe(s): Secondary | ICD-10-CM | POA: Diagnosis not present

## 2024-02-08 DIAGNOSIS — M79675 Pain in left toe(s): Secondary | ICD-10-CM

## 2024-02-08 DIAGNOSIS — B351 Tinea unguium: Secondary | ICD-10-CM | POA: Diagnosis not present

## 2024-02-08 NOTE — Progress Notes (Signed)
 Chief Complaint  Patient presents with   Nail Problem    Pt is here to have toenails trimmed and concern with hammertoe on the left foot.    Subjective: 78 y.o. male nonambulatory and an electric scooter secondary to stroke 1999 presenting for foot evaluation and routine footcare.  Bunion and hammertoe deformity noted.  He is also unable to trim his own nails.  Past Medical History:  Diagnosis Date   Adrenal gland disorder (HCC)    tumor present - no change-  no recent increase     Arthritis    Benign pheochromocytoma of right adrenal gland 06/23/2014   Never had histological diagnosis, but reportedly initially diagnosed in 1989   Bunion    left foot   Complication of anesthesia    died on the table during cervical fusion '89 - believed to be d/t too much medication to treat HTN and was later dx with pheochromocytoma   Coronary artery disease    cleared for surg. by Dr. Court - [facility]   H/O hiatal hernia    Hammer toe    Herniated lumbar intervertebral disc    History of anxiety    Hyperlipidemia    Hypertension    Neurogenic bladder    Neuromuscular disorder (HCC)    carpal tunnel problem, even after surgery release    Peripheral arterial disease (HCC)    Primary aldosteronism (HCC)    Stroke (HCC)    in [place]- 1999, L sided weakness, wears a brace on L leg & uses cane   Allergies  Allergen Reactions   Amitriptyline Hcl Hypertension   Other Shortness Of Breath and Other (See Comments)    SKIN is sensitive- Guards for the underwear MUST be removed immediately after using  Anesthesia- They almost could not get me back (HAS TO TAKE SPIRONOLACTONE  NOW)   Losartan  Potassium Other (See Comments)    Hypotension   Papaya Derivatives Itching and Other (See Comments)    Itching of the throat   Sulfa  Antibiotics Other (See Comments)    Constipation     Objective: Physical Exam General: The patient is alert and oriented x3 in no acute distress.  Dermatology: Skin  integument intact.  No open wounds or preulcerative calluses noted.  Currently there is no wound noted to the hammertoes  There is chronic irritation from the second digit toenails bilaterally due to rubbing in his close toed shoes.  Remaining nails are elongated and dystrophic as well  Vascular: Palpable pedal pulses bilaterally. No edema or erythema noted. Capillary refill within normal limits.  Neurological: Light touch and protective threshold grossly intact bilaterally.   Musculoskeletal Exam: Hallux valgus deformity noted bilateral with overlapping deformity of the second digit bilateral left worse than the right.  There is complete dorsal subluxation of the MTP of the second digit   Assessment: 1. HAV w/ bunion deformity bilateral 2.  Overlapping hammertoe deformity second digit bilateral 3.  Pain due to onychomycosis of toenails both  Plan of Care:  -Patient was evaluated.  -Mechanical debridement of nails 1-5 bilateral performed using a nail nipper without incident or bleeding.  The nails to the second digits were essentially so friable that the entire nail was removed.  No bleeding -In regards to the bunion and hammertoe deformity, we did discuss the possibility of amputation of the left second toe if it becomes symptomatic or recurrent ulcer develops.  For now there is no open wound and it appears very stable.  We will  continue to pursue conservative treatment -Return to clinic PRN   Thresa EMERSON Sar, DPM [facility]  Dr. Thresa EMERSON Sar, DPM    [address]                                        [place], [place] [address]                Office [phone]  Fax [phone]

## 2024-05-16 ENCOUNTER — Encounter: Payer: Self-pay | Admitting: Radiology

## 2024-06-07 ENCOUNTER — Ambulatory Visit: Payer: Medicare (Managed Care)

## 2024-06-07 ENCOUNTER — Ambulatory Visit (HOSPITAL_COMMUNITY): Payer: Medicare (Managed Care)

## 2024-06-08 ENCOUNTER — Ambulatory Visit: Payer: Medicare (Managed Care) | Admitting: "Endocrinology

## 2024-07-27 ENCOUNTER — Ambulatory Visit: Payer: Medicare (Managed Care) | Admitting: "Endocrinology

## 2024-08-16 ENCOUNTER — Ambulatory Visit: Payer: Medicare (Managed Care)

## 2024-08-16 ENCOUNTER — Ambulatory Visit (HOSPITAL_COMMUNITY): Payer: Medicare (Managed Care)

## 2024-08-29 ENCOUNTER — Ambulatory Visit: Payer: Medicare (Managed Care) | Admitting: Podiatry
